# Patient Record
Sex: Male | Born: 1970 | Race: Black or African American | Hispanic: No | Marital: Married | Smoking: Current some day smoker
Health system: Southern US, Community
[De-identification: ages and names within clinical notes are randomized; demographics above are authoritative.]

## PROBLEM LIST (undated history)

## (undated) DIAGNOSIS — R918 Other nonspecific abnormal finding of lung field: Secondary | ICD-10-CM

## (undated) DIAGNOSIS — I639 Cerebral infarction, unspecified: Secondary | ICD-10-CM

## (undated) DIAGNOSIS — R930 Abnormal findings on diagnostic imaging of skull and head, not elsewhere classified: Secondary | ICD-10-CM

## (undated) DIAGNOSIS — Z0189 Encounter for other specified special examinations: Secondary | ICD-10-CM

## (undated) DIAGNOSIS — E786 Lipoprotein deficiency: Secondary | ICD-10-CM

## (undated) DIAGNOSIS — Z8619 Personal history of other infectious and parasitic diseases: Secondary | ICD-10-CM

## (undated) DIAGNOSIS — E785 Hyperlipidemia, unspecified: Secondary | ICD-10-CM

## (undated) DIAGNOSIS — R9089 Other abnormal findings on diagnostic imaging of central nervous system: Secondary | ICD-10-CM

## (undated) DIAGNOSIS — I10 Essential (primary) hypertension: Secondary | ICD-10-CM

## (undated) DIAGNOSIS — R569 Unspecified convulsions: Secondary | ICD-10-CM

## (undated) HISTORY — PX: APPENDECTOMY: SHX54

## (undated) HISTORY — DX: Essential (primary) hypertension: I10

## (undated) HISTORY — DX: Encounter for other specified special examinations: Z01.89

## (undated) HISTORY — DX: Other nonspecific abnormal finding of lung field: R91.8

## (undated) HISTORY — DX: Abnormal findings on diagnostic imaging of skull and head, not elsewhere classified: R93.0

## (undated) HISTORY — DX: Other abnormal findings on diagnostic imaging of central nervous system: R90.89

## (undated) HISTORY — DX: Hyperlipidemia, unspecified: E78.5

## (undated) HISTORY — DX: Lipoprotein deficiency: E78.6

## (undated) HISTORY — DX: Personal history of other infectious and parasitic diseases: Z86.19

---

## 2010-09-18 ENCOUNTER — Emergency Department (HOSPITAL_COMMUNITY): Payer: No Typology Code available for payment source

## 2010-09-18 ENCOUNTER — Emergency Department (HOSPITAL_COMMUNITY)
Admission: EM | Admit: 2010-09-18 | Discharge: 2010-09-18 | Disposition: A | Discharge status: Home / Self Care | Payer: No Typology Code available for payment source | Attending: Emergency Medicine | Admitting: Emergency Medicine

## 2010-09-18 DIAGNOSIS — T1490XA Injury, unspecified, initial encounter: Secondary | ICD-10-CM | POA: Insufficient documentation

## 2010-09-18 DIAGNOSIS — Y92410 Unspecified street and highway as the place of occurrence of the external cause: Secondary | ICD-10-CM | POA: Insufficient documentation

## 2010-09-18 DIAGNOSIS — M25519 Pain in unspecified shoulder: Secondary | ICD-10-CM | POA: Insufficient documentation

## 2010-09-18 DIAGNOSIS — R079 Chest pain, unspecified: Secondary | ICD-10-CM | POA: Insufficient documentation

## 2010-09-18 DIAGNOSIS — R0789 Other chest pain: Secondary | ICD-10-CM | POA: Insufficient documentation

## 2010-09-18 DIAGNOSIS — R51 Headache: Secondary | ICD-10-CM | POA: Insufficient documentation

## 2010-09-18 DIAGNOSIS — M542 Cervicalgia: Secondary | ICD-10-CM | POA: Insufficient documentation

## 2010-09-18 LAB — DIFFERENTIAL
Basophils Absolute: 0 10*3/uL (ref 0.0–0.1)
Basophils Relative: 1 % (ref 0–1)
Eosinophils Absolute: 0.1 10*3/uL (ref 0.0–0.7)
Eosinophils Relative: 1 % (ref 0–5)
Monocytes Absolute: 0.4 10*3/uL (ref 0.1–1.0)
Monocytes Relative: 7 % (ref 3–12)
Neutro Abs: 4.2 10*3/uL (ref 1.7–7.7)

## 2010-09-18 LAB — URINALYSIS, ROUTINE W REFLEX MICROSCOPIC
Bilirubin Urine: NEGATIVE
Glucose, UA: NEGATIVE mg/dL
Hgb urine dipstick: NEGATIVE
Protein, ur: NEGATIVE mg/dL
Specific Gravity, Urine: 1.017 (ref 1.005–1.030)
Urobilinogen, UA: 1 mg/dL (ref 0.0–1.0)

## 2010-09-18 LAB — BASIC METABOLIC PANEL
Calcium: 9.6 mg/dL (ref 8.4–10.5)
GFR calc Af Amer: >60 mL/min (ref >60)
GFR calc non Af Amer: >60 mL/min (ref >60)
Potassium: 3.6 mEq/L (ref 3.5–5.1)
Sodium: 139 mEq/L (ref 135–145)

## 2010-09-18 LAB — CBC
MCH: 28.3 pg (ref 26.0–34.0)
MCHC: 33.2 g/dL (ref 30.0–36.0)
RDW: 13.8 % (ref 11.5–15.5)

## 2010-09-18 LAB — CK TOTAL AND CKMB (NOT AT ARMC)
CK, MB: 2 ng/mL (ref 0.3–4.0)
Relative Index: 0.6 (ref 0.0–2.5)

## 2010-09-18 MED ORDER — IOHEXOL 300 MG/ML  SOLN
100.0000 mL | Freq: Once | INTRAMUSCULAR | Status: AC | PRN
  Administered 2010-09-18: 100 mL via INTRAVENOUS

## 2010-09-26 ENCOUNTER — Emergency Department (HOSPITAL_COMMUNITY)
Admission: EM | Admit: 2010-09-26 | Discharge: 2010-09-27 | Disposition: A | Discharge status: Home / Self Care | Payer: BC Managed Care – PPO | Attending: Emergency Medicine | Admitting: Emergency Medicine

## 2010-09-26 ENCOUNTER — Emergency Department (HOSPITAL_COMMUNITY): Payer: BC Managed Care – PPO

## 2010-09-26 DIAGNOSIS — W269XXA Contact with unspecified sharp object(s), initial encounter: Secondary | ICD-10-CM | POA: Insufficient documentation

## 2010-09-26 DIAGNOSIS — Y92009 Unspecified place in unspecified non-institutional (private) residence as the place of occurrence of the external cause: Secondary | ICD-10-CM | POA: Insufficient documentation

## 2010-09-26 DIAGNOSIS — S51009A Unspecified open wound of unspecified elbow, initial encounter: Secondary | ICD-10-CM | POA: Insufficient documentation

## 2010-10-26 ENCOUNTER — Encounter: Payer: Self-pay | Admitting: Emergency Medicine

## 2010-10-26 DIAGNOSIS — I129 Hypertensive chronic kidney disease with stage 1 through stage 4 chronic kidney disease, or unspecified chronic kidney disease: Secondary | ICD-10-CM | POA: Insufficient documentation

## 2010-10-26 DIAGNOSIS — I10 Essential (primary) hypertension: Secondary | ICD-10-CM | POA: Insufficient documentation

## 2010-10-26 DIAGNOSIS — R918 Other nonspecific abnormal finding of lung field: Secondary | ICD-10-CM

## 2010-10-27 ENCOUNTER — Encounter: Payer: Self-pay | Admitting: Emergency Medicine

## 2010-10-27 ENCOUNTER — Ambulatory Visit (INDEPENDENT_AMBULATORY_CARE_PROVIDER_SITE_OTHER): Payer: BC Managed Care – PPO | Admitting: Emergency Medicine

## 2010-10-27 VITALS — BP 160/100 | HR 76 | Temp 98.7°F | Ht 74.0 in | Wt 205.4 lb

## 2010-10-27 DIAGNOSIS — R918 Other nonspecific abnormal finding of lung field: Secondary | ICD-10-CM

## 2010-10-27 DIAGNOSIS — R222 Localized swelling, mass and lump, trunk: Secondary | ICD-10-CM

## 2010-10-27 NOTE — Assessment & Plan Note (Signed)
Bilateral mainstem endobronchial lesions, ? Etiology . I don't see any other nodules.  - schedule FOB for airway exam - rov the week after to discuss results.
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
## 2010-10-27 NOTE — Patient Instructions (Signed)
We will perform bronchoscopy to evaluate your airways.  Please follow up with Dr Delton Coombes in 2 weeks to discuss the results

## 2010-10-28 ENCOUNTER — Encounter (HOSPITAL_COMMUNITY): Payer: BC Managed Care – PPO

## 2010-11-03 ENCOUNTER — Other Ambulatory Visit: Payer: Self-pay | Admitting: Emergency Medicine

## 2010-11-03 ENCOUNTER — Ambulatory Visit (HOSPITAL_COMMUNITY)
Admission: RE | Admit: 2010-11-03 | Discharge: 2010-11-03 | Disposition: A | Discharge status: Home / Self Care | Payer: BC Managed Care – PPO | Source: Ambulatory Visit | Attending: Emergency Medicine | Admitting: Emergency Medicine

## 2010-11-03 DIAGNOSIS — R222 Localized swelling, mass and lump, trunk: Secondary | ICD-10-CM

## 2010-11-03 DIAGNOSIS — R918 Other nonspecific abnormal finding of lung field: Secondary | ICD-10-CM

## 2010-11-03 DIAGNOSIS — J984 Other disorders of lung: Secondary | ICD-10-CM | POA: Insufficient documentation

## 2010-11-03 DIAGNOSIS — I10 Essential (primary) hypertension: Secondary | ICD-10-CM | POA: Insufficient documentation

## 2010-11-05 NOTE — Op Note (Signed)
[name], [name] NO.:  [id number]  MEDICAL RECORD NO.:  [id number]  LOCATION:  RESP                         FACILITY:  [facility]  PHYSICIAN:  Leslye Peer, MD    DATE OF BIRTH:  1970/09/08  DATE OF PROCEDURE:  11/03/2010 DATE OF DISCHARGE:                              OPERATIVE REPORT   PROCEDURE:  Video fiberoptic bronchoscopy with biopsies.  OPERATOR:  Leslye Peer, MD.  INDICATION:  Abnormal CT scan of the chest with possible endobronchial polyps.  MEDICATIONS GIVEN: 1. Fentanyl 100 mcg IV in divided doses. 2. Versed 6 mg IV in divided doses. 3. Hydralazine 20 mg IV in divided doses. 4. Lidocaine 1%, 15 cc total to the tracheobronchial tree.  CONSENT:  Informed consent was obtained from the patient and a signed copy is on his hospital chart.  PROCEDURE DETAILS:  [name] is a very pleasant 40 year old gentleman with little past medical history beyond hypertension.  He had a CT scan of his chest performed that showed bilateral mainstem lobular lesions, larger on the left than on the right.  He was referred for evaluation of the abnormal CT scan of the chest.  We elected to do fiberoptic bronchoscopy to investigate the presence of these lesions.  On the date of the procedure, he was taken to the endoscopy suite and informed consent was obtained.  Conscious sedation was initiated after a formal time-out as indicated above.  He was hypertensive at the beginning of the case and remained so after sedation was started.  For this reason, he received hydralazine 10 mg IV with improvement in his hypertension to the 150s over 80s to 90s.  The bronchoscope was introduced without difficulty through the right nare and the glottis was visualized and was normal in appearance.  The glottis was treated with 1% lidocaine and then the trachea was intubated.  The proximal and distal trachea were within normal limits.  The main carina was sharp with some evidence  of pitting right at the apex of the main carina.  This area was brushed and biopsied with endobronchial biopsies.  Inspection of the right mainstem and left mainstem bronchi revealed that the suspected endobronchial polyps were absent.  There was no evidence of any significant mainstem endobronchial lesions.  I suspect that the CT findings reflected mucus which has since been cleared.  Inspection of the right-sided airways revealed a normal right upper lobe, right middle lobe, and right lower lobe carina and airways without any endobronchial lesions or abnormal secretions.  The left side exam showed a normal left lower lobe.  The left upper lobe bronchus had a small area of hypopigmentation and approximately 5-6 o'clock.  This was biopsied and brushed to be sent for cytology and pathology.  The lingula appeared normal.  Bronchial washings were pooled and will be sent for microbiology and cytology. The patient tolerated the procedure well.  There was no significant blood loss, and there were no obvious complications.  He was taken to endoscopy and recovered from his conscious sedation.  SAMPLES: 1. Endobronchial biopsies from the main carina. 2. Endobronchial biopsies from the left upper lobe. 3. Endobronchial brushings from the main  carina. 4. Endobronchial brushings from the left upper lobe.  PLANS:  We will follow up the cytology and pathology results with [name] when they become available.  He needs to obtain a primary care physician in order to be started on antihypertensive therapy.  He received a total of 20 mg hydralazine while in the endoscopy suite to maintain adequate blood pressure control.  We will help refer him to a primary care physician when I see him and follow up to review his bronchoscopy results.     Leslye Peer, MD     RSB/MEDQ  D:  11/03/2010  T:  11/03/2010  Job:  [id number]  Electronically Signed by Levy Pupa MD on 11/05/2010 12:33:41 PM

## 2010-11-06 LAB — CULTURE, BAL-QUANTITATIVE W GRAM STAIN
Colony Count: 40000
Gram Stain: NONE SEEN

## 2010-11-15 ENCOUNTER — Ambulatory Visit (INDEPENDENT_AMBULATORY_CARE_PROVIDER_SITE_OTHER): Payer: BC Managed Care – PPO | Admitting: Emergency Medicine

## 2010-11-15 ENCOUNTER — Encounter: Payer: Self-pay | Admitting: Emergency Medicine

## 2010-11-15 VITALS — BP 170/110 | HR 82 | Temp 98.1°F | Ht 74.0 in | Wt 205.0 lb

## 2010-11-15 DIAGNOSIS — R918 Other nonspecific abnormal finding of lung field: Secondary | ICD-10-CM

## 2010-11-15 DIAGNOSIS — R222 Localized swelling, mass and lump, trunk: Secondary | ICD-10-CM

## 2010-11-15 NOTE — Assessment & Plan Note (Signed)
Turned out to be retained mucous in airways. Bx's all negative. Reassured him and advised him to stop smoking.
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
## 2010-11-15 NOTE — Patient Instructions (Signed)
Patient needs to find a primary care physician (possibly with [facility]) Follow-up with Dr. Delton Coombes as needed.

## 2010-12-01 LAB — FUNGUS CULTURE W SMEAR: Fungal Smear: NONE SEEN

## 2010-12-18 LAB — AFB CULTURE WITH SMEAR (NOT AT ARMC)

## 2011-05-21 ENCOUNTER — Emergency Department (HOSPITAL_COMMUNITY)
Admission: EM | Admit: 2011-05-21 | Discharge: 2011-05-21 | Disposition: A | Discharge status: Home / Self Care | Payer: BC Managed Care – PPO | Attending: Emergency Medicine | Admitting: Emergency Medicine

## 2011-05-21 ENCOUNTER — Encounter (HOSPITAL_COMMUNITY): Payer: Self-pay

## 2011-05-21 DIAGNOSIS — S51809A Unspecified open wound of unspecified forearm, initial encounter: Secondary | ICD-10-CM | POA: Insufficient documentation

## 2011-05-21 DIAGNOSIS — W261XXA Contact with sword or dagger, initial encounter: Secondary | ICD-10-CM | POA: Insufficient documentation

## 2011-05-21 DIAGNOSIS — F17200 Nicotine dependence, unspecified, uncomplicated: Secondary | ICD-10-CM | POA: Insufficient documentation

## 2011-05-21 DIAGNOSIS — S51812A Laceration without foreign body of left forearm, initial encounter: Secondary | ICD-10-CM

## 2011-05-21 DIAGNOSIS — W260XXA Contact with knife, initial encounter: Secondary | ICD-10-CM | POA: Insufficient documentation

## 2011-05-21 DIAGNOSIS — I10 Essential (primary) hypertension: Secondary | ICD-10-CM | POA: Insufficient documentation

## 2011-05-21 MED ORDER — LIDOCAINE-EPINEPHRINE 2 %-1:100000 IJ SOLN: 20.0000 mL | Freq: Once | INTRAMUSCULAR | Status: DC

## 2011-05-21 MED ORDER — HYDROCODONE-ACETAMINOPHEN 5-500 MG PO TABS: 1.0000 | ORAL_TABLET | Freq: Four times a day (QID) | ORAL | Status: AC | PRN

## 2011-05-21 MED ORDER — OXYCODONE-ACETAMINOPHEN 5-325 MG PO TABS
1.0000 | ORAL_TABLET | Freq: Once | ORAL | Status: AC
  Administered 2011-05-21: 1 via ORAL
  Filled 2011-05-21: qty 1

## 2011-05-21 MED ORDER — LIDOCAINE-EPINEPHRINE 1 %-1:100000 IJ SOLN: 20.0000 mL | Freq: Once | INTRAMUSCULAR | Status: DC
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
## 2011-05-21 NOTE — Discharge Instructions (Signed)
Return to 10 days to have your suture removal.  Return sooner if you notice signs of infection.  Take pain medication as needed.  Laceration Care, Adult A laceration is a cut or lesion that goes through all layers of the skin and into the tissue just beneath the skin. TREATMENT  Some lacerations may not require closure. Some lacerations may not be able to be closed due to an increased risk of infection. It is important to see your caregiver as soon as possible after an injury to minimize the risk of infection and maximize the opportunity for successful closure. If closure is appropriate, pain medicines may be given, if needed. The wound will be cleaned to help prevent infection. Your caregiver will use stitches (sutures), staples, wound glue (adhesive), or skin adhesive strips to repair the laceration. These tools bring the skin edges together to allow for faster healing and a better cosmetic outcome. However, all wounds will heal with a scar. Once the wound has healed, scarring can be minimized by covering the wound with sunscreen during the day for 1 full year. HOME CARE INSTRUCTIONS  For sutures or staples:  Keep the wound clean and dry.   If you were given a bandage (dressing), you should change it at least once a day. Also, change the dressing if it becomes wet or dirty, or as directed by your caregiver.   Wash the wound with soap and water 2 times a day. Rinse the wound off with water to remove all soap. Pat the wound dry with a clean towel.   After cleaning, apply a thin layer of the antibiotic ointment as recommended by your caregiver. This will help prevent infection and keep the dressing from sticking.   You may shower as usual after the first 24 hours. Do not soak the wound in water until the sutures are removed.   Only take over-the-counter or prescription medicines for pain, discomfort, or fever as directed by your caregiver.   Get your sutures or staples removed as directed by  your caregiver.  For skin adhesive strips:  Keep the wound clean and dry.   Do not get the skin adhesive strips wet. You may bathe carefully, using caution to keep the wound dry.   If the wound gets wet, pat it dry with a clean towel.   Skin adhesive strips will fall off on their own. You may trim the strips as the wound heals. Do not remove skin adhesive strips that are still stuck to the wound. They will fall off in time.  For wound adhesive:  You may briefly wet your wound in the shower or bath. Do not soak or scrub the wound. Do not swim. Avoid periods of heavy perspiration until the skin adhesive has fallen off on its own. After showering or bathing, gently pat the wound dry with a clean towel.   Do not apply liquid medicine, cream medicine, or ointment medicine to your wound while the skin adhesive is in place. This may loosen the film before your wound is healed.   If a dressing is placed over the wound, be careful not to apply tape directly over the skin adhesive. This may cause the adhesive to be pulled off before the wound is healed.   Avoid prolonged exposure to sunlight or tanning lamps while the skin adhesive is in place. Exposure to ultraviolet light in the first year will darken the scar.   The skin adhesive will usually remain in place for 5 to  10 days, then naturally fall off the skin. Do not pick at the adhesive film.  You may need a tetanus shot if:  You cannot remember when you had your last tetanus shot.   You have never had a tetanus shot.  If you get a tetanus shot, your arm may swell, get red, and feel warm to the touch. This is common and not a problem. If you need a tetanus shot and you choose not to have one, there is a rare chance of getting tetanus. Sickness from tetanus can be serious. SEEK MEDICAL CARE IF:   You have redness, swelling, or increasing pain in the wound.   You see a red line that goes away from the wound.   You have yellowish-white fluid  (pus) coming from the wound.   You have a fever.   You notice a bad smell coming from the wound or dressing.   Your wound breaks open before or after sutures have been removed.   You notice something coming out of the wound such as wood or glass.   Your wound is on your hand or foot and you cannot move a finger or toe.  SEEK IMMEDIATE MEDICAL CARE IF:   Your pain is not controlled with prescribed medicine.   You have severe swelling around the wound causing pain and numbness or a change in color in your arm, hand, leg, or foot.   Your wound splits open and starts bleeding.   You have worsening numbness, weakness, or loss of function of any joint around or beyond the wound.   You develop painful lumps near the wound or on the skin anywhere on your body.  MAKE SURE YOU:   Understand these instructions.   Will watch your condition.   Will get help right away if you are not doing well or get worse.  Document Released: 01/30/2005 Document Revised: 01/19/2011 Document Reviewed: 07/26/2010 [facility] Patient Information 2012 [place], [place].

## 2011-05-21 NOTE — ED Notes (Signed)
Patient began to feel dizzy in triage with mild diaphoresis.

## 2011-05-21 NOTE — ED Provider Notes (Signed)
Medical screening examination/treatment/procedure(s) were performed by non-physician practitioner and as supervising physician I was immediately available for consultation/collaboration.   Carleene Cooper III, MD 05/21/11 [id number]

## 2011-07-03 ENCOUNTER — Emergency Department (HOSPITAL_COMMUNITY)
Admission: EM | Admit: 2011-07-03 | Discharge: 2011-07-03 | Disposition: A | Discharge status: Home / Self Care | Payer: BC Managed Care – PPO | Source: Home / Self Care | Attending: Family Medicine | Admitting: Family Medicine

## 2011-07-03 ENCOUNTER — Encounter (HOSPITAL_COMMUNITY): Payer: Self-pay

## 2011-07-03 DIAGNOSIS — Z202 Contact with and (suspected) exposure to infections with a predominantly sexual mode of transmission: Secondary | ICD-10-CM

## 2011-07-03 LAB — URINE MICROSCOPIC-ADD ON

## 2011-07-03 LAB — URINALYSIS, ROUTINE W REFLEX MICROSCOPIC
Glucose, UA: NEGATIVE mg/dL
Nitrite: NEGATIVE
pH: 6.5 (ref 5.0–8.0)

## 2011-07-03 MED ORDER — METRONIDAZOLE 500 MG PO TABS
2000.0000 mg | ORAL_TABLET | ORAL | Status: AC
  Administered 2011-07-03: 2000 mg via ORAL

## 2011-07-03 MED ORDER — METRONIDAZOLE 500 MG PO TABS
ORAL_TABLET | ORAL | Status: AC
  Filled 2011-07-03: qty 2

## 2011-07-03 NOTE — Discharge Instructions (Signed)
Read the information below.  If you develop fevers, abdominal pain, nausea and vomiting, penile discharge, or pain with urination, return to the urgent care.  You may return to the urgent care at any time for worsening condition or any new symptoms that concern you.
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
## 2011-07-03 NOTE — ED Notes (Signed)
Partner here w him; wants to be checked for STD; pt c/o has been having pain in lower abdominal area

## 2011-07-04 LAB — GC/CHLAMYDIA PROBE AMP, GENITAL: GC Probe Amp, Genital: NEGATIVE

## 2011-07-04 NOTE — ED Provider Notes (Signed)
Medical screening examination/treatment/procedure(s) were performed by non-physician practitioner and as supervising physician I was immediately available for consultation/collaboration.   [facility]; MD   Sharin Grave, MD 07/04/11 1228

## 2011-08-07 ENCOUNTER — Telehealth: Payer: Self-pay | Admitting: Emergency Medicine

## 2011-08-07 NOTE — Telephone Encounter (Signed)
Forward to Dr. Delton Coombes for review on 08-07-11 ym

## 2012-09-13 DIAGNOSIS — I639 Cerebral infarction, unspecified: Secondary | ICD-10-CM

## 2012-09-13 DIAGNOSIS — E785 Hyperlipidemia, unspecified: Secondary | ICD-10-CM

## 2012-09-13 HISTORY — DX: Hyperlipidemia, unspecified: E78.5

## 2012-09-13 HISTORY — DX: Cerebral infarction, unspecified: I63.9

## 2012-09-15 ENCOUNTER — Emergency Department (HOSPITAL_COMMUNITY): Payer: 59

## 2012-09-15 ENCOUNTER — Encounter (HOSPITAL_COMMUNITY): Payer: Self-pay | Admitting: Cardiology

## 2012-09-15 ENCOUNTER — Inpatient Hospital Stay (HOSPITAL_COMMUNITY)
Admission: EM | Admit: 2012-09-15 | Discharge: 2012-09-18 | DRG: 065 | Disposition: A | Discharge status: Home / Self Care | Payer: 59 | Attending: Family Medicine | Admitting: Family Medicine

## 2012-09-15 ENCOUNTER — Inpatient Hospital Stay (HOSPITAL_COMMUNITY): Payer: 59

## 2012-09-15 DIAGNOSIS — G8190 Hemiplegia, unspecified affecting unspecified side: Secondary | ICD-10-CM | POA: Diagnosis present

## 2012-09-15 DIAGNOSIS — I639 Cerebral infarction, unspecified: Secondary | ICD-10-CM

## 2012-09-15 DIAGNOSIS — I129 Hypertensive chronic kidney disease with stage 1 through stage 4 chronic kidney disease, or unspecified chronic kidney disease: Secondary | ICD-10-CM | POA: Diagnosis present

## 2012-09-15 DIAGNOSIS — R531 Weakness: Secondary | ICD-10-CM | POA: Diagnosis present

## 2012-09-15 DIAGNOSIS — Z8249 Family history of ischemic heart disease and other diseases of the circulatory system: Secondary | ICD-10-CM

## 2012-09-15 DIAGNOSIS — Z720 Tobacco use: Secondary | ICD-10-CM | POA: Diagnosis present

## 2012-09-15 DIAGNOSIS — I63522 Cerebral infarction due to unspecified occlusion or stenosis of left anterior cerebral artery: Secondary | ICD-10-CM | POA: Diagnosis present

## 2012-09-15 DIAGNOSIS — Z8673 Personal history of transient ischemic attack (TIA), and cerebral infarction without residual deficits: Secondary | ICD-10-CM

## 2012-09-15 DIAGNOSIS — I6340 Cerebral infarction due to embolism of unspecified cerebral artery: Principal | ICD-10-CM | POA: Diagnosis present

## 2012-09-15 DIAGNOSIS — R930 Abnormal findings on diagnostic imaging of skull and head, not elsewhere classified: Secondary | ICD-10-CM

## 2012-09-15 DIAGNOSIS — Z7982 Long term (current) use of aspirin: Secondary | ICD-10-CM

## 2012-09-15 DIAGNOSIS — F17200 Nicotine dependence, unspecified, uncomplicated: Secondary | ICD-10-CM | POA: Diagnosis present

## 2012-09-15 DIAGNOSIS — Z833 Family history of diabetes mellitus: Secondary | ICD-10-CM

## 2012-09-15 DIAGNOSIS — R9089 Other abnormal findings on diagnostic imaging of central nervous system: Secondary | ICD-10-CM

## 2012-09-15 DIAGNOSIS — N183 Chronic kidney disease, stage 3 unspecified: Secondary | ICD-10-CM | POA: Diagnosis present

## 2012-09-15 DIAGNOSIS — Z79899 Other long term (current) drug therapy: Secondary | ICD-10-CM

## 2012-09-15 DIAGNOSIS — I6350 Cerebral infarction due to unspecified occlusion or stenosis of unspecified cerebral artery: Secondary | ICD-10-CM

## 2012-09-15 DIAGNOSIS — I10 Essential (primary) hypertension: Secondary | ICD-10-CM | POA: Diagnosis present

## 2012-09-15 DIAGNOSIS — M6281 Muscle weakness (generalized): Secondary | ICD-10-CM

## 2012-09-15 DIAGNOSIS — E785 Hyperlipidemia, unspecified: Secondary | ICD-10-CM | POA: Diagnosis present

## 2012-09-15 HISTORY — DX: Abnormal findings on diagnostic imaging of skull and head, not elsewhere classified: R93.0

## 2012-09-15 HISTORY — DX: Other abnormal findings on diagnostic imaging of central nervous system: R90.89

## 2012-09-15 HISTORY — DX: Cerebral infarction, unspecified: I63.9

## 2012-09-15 LAB — POCT I-STAT, CHEM 8
BUN: 16 mg/dL (ref 6–23)
Chloride: 106 mEq/L (ref 96–112)
Creatinine, Ser: 1.2 mg/dL (ref 0.50–1.35)
Potassium: 4.1 mEq/L (ref 3.5–5.1)
Sodium: 142 mEq/L (ref 135–145)

## 2012-09-15 LAB — PROTIME-INR: INR: 0.96 (ref 0.00–1.49)

## 2012-09-15 LAB — COMPREHENSIVE METABOLIC PANEL
ALT: 13 U/L (ref 0–53)
Alkaline Phosphatase: 83 U/L (ref 39–117)
BUN: 17 mg/dL (ref 6–23)
CO2: 26 mEq/L (ref 19–32)
GFR calc Af Amer: 88 mL/min — ABNORMAL LOW (ref >90)
GFR calc non Af Amer: 76 mL/min — ABNORMAL LOW (ref >90)
Glucose, Bld: 102 mg/dL — ABNORMAL HIGH (ref 70–99)
Potassium: 4.1 mEq/L (ref 3.5–5.1)
Sodium: 140 mEq/L (ref 135–145)
Total Bilirubin: 0.7 mg/dL (ref 0.3–1.2)
Total Protein: 7 g/dL (ref 6.0–8.3)

## 2012-09-15 LAB — URINALYSIS, ROUTINE W REFLEX MICROSCOPIC
Glucose, UA: NEGATIVE mg/dL
Leukocytes, UA: NEGATIVE
Protein, ur: NEGATIVE mg/dL
Specific Gravity, Urine: 1.01 (ref 1.005–1.030)
pH: 6.5 (ref 5.0–8.0)

## 2012-09-15 LAB — CREATININE, SERUM
Creatinine, Ser: 1.1 mg/dL (ref 0.50–1.35)
GFR calc non Af Amer: 81 mL/min — ABNORMAL LOW (ref >90)

## 2012-09-15 LAB — CBC
Hemoglobin: 14.4 g/dL (ref 13.0–17.0)
Hemoglobin: 14.2 g/dL (ref 13.0–17.0)
MCH: 29.6 pg (ref 26.0–34.0)
MCHC: 34.1 g/dL (ref 30.0–36.0)
MCV: 85.6 fL (ref 78.0–100.0)
Platelets: 158 10*3/uL (ref 150–400)
RBC: 4.86 MIL/uL (ref 4.22–5.81)
RBC: 4.89 MIL/uL (ref 4.22–5.81)
WBC: 5.6 10*3/uL (ref 4.0–10.5)

## 2012-09-15 LAB — URINE MICROSCOPIC-ADD ON

## 2012-09-15 LAB — DIFFERENTIAL
Eosinophils Absolute: 0.2 10*3/uL (ref 0.0–0.7)
Lymphocytes Relative: 23 % (ref 12–46)
Lymphs Abs: 1.3 10*3/uL (ref 0.7–4.0)
Monocytes Relative: 6 % (ref 3–12)
Neutrophils Relative %: 68 % (ref 43–77)

## 2012-09-15 LAB — POCT I-STAT TROPONIN I

## 2012-09-15 LAB — RAPID URINE DRUG SCREEN, HOSP PERFORMED: Opiates: POSITIVE — AB

## 2012-09-15 LAB — GLUCOSE, CAPILLARY: Glucose-Capillary: 95 mg/dL (ref 70–99)

## 2012-09-15 MED ORDER — ONDANSETRON HCL 4 MG/2ML IJ SOLN
4.0000 mg | Freq: Once | INTRAMUSCULAR | Status: AC
  Administered 2012-09-15: 4 mg via INTRAVENOUS
  Filled 2012-09-15: qty 2

## 2012-09-15 MED ORDER — METOPROLOL TARTRATE 1 MG/ML IV SOLN
2.5000 mg | Freq: Four times a day (QID) | INTRAVENOUS | Status: DC | PRN
  Administered 2012-09-16 (×2): 2.5 mg via INTRAVENOUS
  Filled 2012-09-15 (×3): qty 5

## 2012-09-15 MED ORDER — LABETALOL HCL 5 MG/ML IV SOLN
20.0000 mg | Freq: Once | INTRAVENOUS | Status: AC
  Administered 2012-09-15: 20 mg via INTRAVENOUS
  Filled 2012-09-15: qty 4

## 2012-09-15 MED ORDER — ASPIRIN EC 325 MG PO TBEC
325.0000 mg | DELAYED_RELEASE_TABLET | Freq: Every day | ORAL | Status: DC
  Administered 2012-09-15 – 2012-09-18 (×4): 325 mg via ORAL
  Filled 2012-09-15 (×5): qty 1

## 2012-09-15 MED ORDER — NICOTINE 21 MG/24HR TD PT24
21.0000 mg | MEDICATED_PATCH | Freq: Every day | TRANSDERMAL | Status: DC
  Administered 2012-09-15 – 2012-09-18 (×4): 21 mg via TRANSDERMAL
  Filled 2012-09-15 (×4): qty 1

## 2012-09-15 MED ORDER — ONDANSETRON HCL 4 MG/2ML IJ SOLN: 4.0000 mg | Freq: Three times a day (TID) | INTRAMUSCULAR | Status: AC | PRN

## 2012-09-15 MED ORDER — TRAMADOL HCL 50 MG PO TABS
50.0000 mg | ORAL_TABLET | Freq: Four times a day (QID) | ORAL | Status: DC | PRN
  Administered 2012-09-16: 50 mg via ORAL
  Filled 2012-09-15: qty 1

## 2012-09-15 MED ORDER — MORPHINE SULFATE 4 MG/ML IJ SOLN
4.0000 mg | Freq: Once | INTRAMUSCULAR | Status: AC
  Administered 2012-09-15: 4 mg via INTRAVENOUS
  Filled 2012-09-15: qty 1

## 2012-09-15 MED ORDER — ACETAMINOPHEN 325 MG PO TABS
650.0000 mg | ORAL_TABLET | Freq: Four times a day (QID) | ORAL | Status: DC | PRN
  Administered 2012-09-15 – 2012-09-17 (×4): 650 mg via ORAL
  Filled 2012-09-15 (×4): qty 2

## 2012-09-15 MED ORDER — SENNOSIDES-DOCUSATE SODIUM 8.6-50 MG PO TABS: 1.0000 | ORAL_TABLET | Freq: Every evening | ORAL | Status: DC | PRN

## 2012-09-15 MED ORDER — LABETALOL HCL 5 MG/ML IV SOLN: 20.0000 mg | Freq: Once | INTRAVENOUS | Status: DC

## 2012-09-15 MED ORDER — GADOBENATE DIMEGLUMINE 529 MG/ML IV SOLN
17.0000 mL | Freq: Once | INTRAVENOUS | Status: AC | PRN
  Administered 2012-09-15: 17 mL via INTRAVENOUS

## 2012-09-15 MED ORDER — ENOXAPARIN SODIUM 40 MG/0.4ML ~~LOC~~ SOLN
40.0000 mg | SUBCUTANEOUS | Status: DC
  Administered 2012-09-15: 40 mg via SUBCUTANEOUS
  Filled 2012-09-15 (×3): qty 0.4
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
## 2012-09-15 NOTE — ED Notes (Signed)
Pt reports that he started having some right sided weakness last night that continued into this morning. States that he also developed a headache over the past couple of days. Pt weaker grip strength on the right side and weakness noted in the leg. Pt A&Ox4.
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
## 2012-09-16 DIAGNOSIS — M79A29 Nontraumatic compartment syndrome of unspecified lower extremity: Secondary | ICD-10-CM

## 2012-09-16 DIAGNOSIS — Z8673 Personal history of transient ischemic attack (TIA), and cerebral infarction without residual deficits: Secondary | ICD-10-CM

## 2012-09-16 LAB — BASIC METABOLIC PANEL
BUN: 16 mg/dL (ref 6–23)
CO2: 27 mEq/L (ref 19–32)
Calcium: 9.7 mg/dL (ref 8.4–10.5)
Creatinine, Ser: 1.08 mg/dL (ref 0.50–1.35)
Glucose, Bld: 153 mg/dL — ABNORMAL HIGH (ref 70–99)

## 2012-09-16 LAB — LIPID PANEL
Cholesterol: 159 mg/dL (ref 0–200)
HDL: 30 mg/dL — ABNORMAL LOW (ref >39)
LDL Cholesterol: 104 mg/dL — ABNORMAL HIGH (ref 0–99)
Triglycerides: 123 mg/dL (ref <150)

## 2012-09-16 LAB — CARDIOLIPIN ANTIBODIES, IGG, IGM, IGA
Anticardiolipin IgG: 10 GPL U/mL — ABNORMAL LOW (ref <23)
Anticardiolipin IgM: 2 MPL U/mL — ABNORMAL LOW (ref <11)

## 2012-09-16 LAB — LUPUS ANTICOAGULANT PANEL
DRVVT: 36.8 secs (ref <42.9)
Lupus Anticoagulant: NOT DETECTED

## 2012-09-16 LAB — HEMOGLOBIN A1C: Hgb A1c MFr Bld: 5.2 % (ref <5.7)

## 2012-09-16 MED ORDER — POTASSIUM CHLORIDE CRYS ER 20 MEQ PO TBCR
40.0000 meq | EXTENDED_RELEASE_TABLET | Freq: Once | ORAL | Status: AC
  Administered 2012-09-16: 40 meq via ORAL
  Filled 2012-09-16: qty 2

## 2012-09-16 MED ORDER — PNEUMOCOCCAL VAC POLYVALENT 25 MCG/0.5ML IJ INJ
0.5000 mL | INJECTION | INTRAMUSCULAR | Status: AC
  Administered 2012-09-17: 0.5 mL via INTRAMUSCULAR
  Filled 2012-09-16: qty 0.5

## 2012-09-16 MED ORDER — AMLODIPINE BESYLATE 10 MG PO TABS
10.0000 mg | ORAL_TABLET | Freq: Every day | ORAL | Status: DC
  Administered 2012-09-17 – 2012-09-18 (×2): 10 mg via ORAL
  Filled 2012-09-16 (×2): qty 1

## 2012-09-16 MED ORDER — ZOLPIDEM TARTRATE 5 MG PO TABS
10.0000 mg | ORAL_TABLET | Freq: Every evening | ORAL | Status: DC | PRN
  Administered 2012-09-16: 10 mg via ORAL
  Filled 2012-09-16: qty 2

## 2012-09-16 MED ORDER — HYDROCHLOROTHIAZIDE 12.5 MG PO CAPS
12.5000 mg | ORAL_CAPSULE | Freq: Every day | ORAL | Status: DC
  Filled 2012-09-16: qty 1

## 2012-09-16 MED ORDER — AMLODIPINE BESYLATE 5 MG PO TABS
5.0000 mg | ORAL_TABLET | Freq: Every day | ORAL | Status: DC
  Administered 2012-09-16 – 2012-09-18 (×3): 5 mg via ORAL
  Filled 2012-09-16 (×3): qty 1

## 2012-09-16 MED ORDER — AMLODIPINE BESYLATE 5 MG PO TABS
5.0000 mg | ORAL_TABLET | Freq: Once | ORAL | Status: AC
  Administered 2012-09-16: 5 mg via ORAL
  Filled 2012-09-16: qty 1

## 2012-09-16 MED ORDER — ATORVASTATIN CALCIUM 10 MG PO TABS
10.0000 mg | ORAL_TABLET | Freq: Every day | ORAL | Status: DC
  Administered 2012-09-16 – 2012-09-17 (×2): 10 mg via ORAL
  Filled 2012-09-16 (×3): qty 1

## 2012-09-16 MED ORDER — AMLODIPINE BESYLATE 10 MG PO TABS
10.0000 mg | ORAL_TABLET | Freq: Every day | ORAL | Status: DC
  Filled 2012-09-16: qty 1

## 2012-09-16 NOTE — Progress Notes (Signed)
SLP Cancellation Note  Patient Details Name: Henry Simmons MRN: [id number] DOB: 1970-04-24   Cancelled treatment:       Reason Eval/Treat Not Completed: Patient at procedure or test/unavailable. Attempted cognitive linguistic eval, pt out of room. Will try again next date.   [name], MA CCC-SLP [phone]  [name] 09/16/2012, 12:15 PM

## 2012-09-16 NOTE — Progress Notes (Signed)
[facility] PROGRESS NOTE  Henry Simmons [facility] RUE:[id number] DOB: 1970-09-11 DOA: 09/15/2012 PCP: Default, Provider, MD  Assessment/Plan: #1 acute left ACA stroke Patient had presented with malignant hypertension with systolic blood pressures in the 200s. Some improvement with blood pressure. Patient also presented with right sided weakness with some improvement. 2-D echo with no source of emboli. Carotid Dopplers with no significant ICA stenosis. Lower extremity Dopplers negative for DVT. LDL is 104. Start a statin. Continue aspirin. Neurology following and appreciate input and recommendations.  #2 malignant hypertension Patient without controlled hypertension. Patient stated was not on any antihypertensive medications prior to admission. We'll place on Norvasc 10 mg daily. Follow blood pressure. Monitor.  #3. Right-sided weakness Secondary to problem #1.  #4 tobacco abuse Tobacco cessation. Nicotine patch.  Code Status: Full Family Communication: Updated patient and wife at bedside. Disposition Plan: Home when medically stable.   Consultants:  Neurology: Dr. Thad Ranger 09/15/2012  Procedures:  MRI head 09/15/2012  2-D echo 09/16/2012  Carotid Dopplers 09/16/2012  Lower extremity Dopplers 09/16/2012  Antibiotics:  None  HPI/Subjective: Patient with no complaints.  Objective: Filed Vitals:   09/16/12 0027 09/16/12 0148 09/16/12 0600 09/16/12 0922  BP: 180/110 197/113 165/104 163/121  Pulse:  76 72 79  Temp:  98 F (36.7 C) 98 F (36.7 C) 98.4 F (36.9 C)  TempSrc:  Oral Oral Oral  Resp:  18 18 18   Height:      Weight:      SpO2:  100% 100% 100%   No intake or output data in the 24 hours ending 09/16/12 1315 Filed Weights   09/15/12 0744 09/15/12 1301  Weight: 79.379 kg (175 lb) 78.7 kg (173 lb 8 oz)    Exam:   General:  NAD  Cardiovascular: RRR  Respiratory: CTAB  Abdomen: Soft/NT/ND/+BS  Extremities: No c/c/e   Data Reviewed: Basic  Metabolic Panel:  Recent Labs Lab 09/15/12 0802 09/15/12 0822 09/15/12 1429 09/16/12 0820  NA 140 142  --  139  K 4.1 4.1  --  3.5  CL 104 106  --  103  CO2 26  --   --  27  GLUCOSE 102* 100*  --  153*  BUN 17 16  --  16  CREATININE 1.16 1.20 1.10 1.08  CALCIUM 9.7  --   --  9.7   Liver Function Tests:  Recent Labs Lab 09/15/12 0802  AST 19  ALT 13  ALKPHOS 83  BILITOT 0.7  PROT 7.0  ALBUMIN 4.2   No results found for this basename: LIPASE, AMYLASE,  in the last 168 hours No results found for this basename: AMMONIA,  in the last 168 hours CBC:  Recent Labs Lab 09/15/12 0802 09/15/12 0822 09/15/12 1429  WBC 5.6  --  6.2  NEUTROABS 3.8  --   --   HGB 14.4 16.0 14.2  HCT 41.6 47.0 41.6  MCV 85.6  --  85.1  PLT 158  --  163   Cardiac Enzymes:  Recent Labs Lab 09/15/12 0802  TROPONINI <0.30   BNP (last 3 results) No results found for this basename: PROBNP,  in the last 8760 hours CBG:  Recent Labs Lab 09/15/12 1003  GLUCAP 95    No results found for this or any previous visit (from the past 240 hour(s)).   Studies: Dg Chest 2 View  09/15/2012   *RADIOLOGY REPORT*  Clinical Data: Stroke  CHEST - 2 VIEW  Comparison: CT chest dated 09/18/2010  Findings: Lungs  are clear. No pleural effusion or pneumothorax.  Cardiomediastinal silhouette is within normal limits.  Visualized osseous structures are within normal limits.  IMPRESSION: No evidence of acute cardiopulmonary disease.   Original Report Authenticated By: Charline Bills, M.D.   Ct Head Wo Contrast  09/15/2012   *RADIOLOGY REPORT*  Clinical Data:  Headache, right gray weakness CT HEAD WITHOUT CONTRAST  Technique:  Contiguous axial images were obtained from the base of the skull through the vertex without contrast.  Comparison: Prior head and cervical spine CT 07/19/2010  Findings: No acute intracranial hemorrhage.  Nonspecific focal hypoattenuation in the posterior left frontal white matter extending  from the corona radiata cephalad into the centrum semiovale valley.  Gray-white differentiation is preserved throughout. No hydronephrosis or midline shift.  Incidentally, the visualized vessels appear diffusely hyper dense.  This can be seen with dehydration or increased hematocrit levels.  Focal scarring in the right posterior parietal scalp.  The globes and orbits are intact.  No focal calvarial abnormality.  Normal aeration of the mastoid air cells and paranasal sinuses.  IMPRESSION:  Nonspecific linear hypoattenuation within the left posterior frontal white matter as described above. Differential considerations include subacute ischemic insult, developing sequela of microvascular ischemia, and an acute/subacute demyelinating or infectious/inflammatory process.  A mass lesion is considered significantly less likely given the absence of associated mass effect.  Recommend further evaluation with brain MRI with and without contrast.   Original Report Authenticated By: Malachy Moan, M.D.   Mr [facility] Head Wo Contrast  09/15/2012   **ADDENDUM** CREATED: 09/15/2012 12:49:31  A voice recognition error is present in the impressions.  Impression #1 should read "Acute non hemorrhagic infarct within the left ACA distribution."  **END ADDENDUM** SIGNED BY: Chauncey Fischer, M.D.  09/15/2012   *RADIOLOGY REPORT*  Clinical Data:  Right-sided weakness.  Abnormal CT scan.  MRI HEAD WITHOUT AND WITH CONTRAST MRA HEAD WITHOUT CONTRAST  Technique:  Multiplanar, multiecho pulse sequences of the brain and surrounding structures were obtained without and with intravenous contrast.  Angiographic images of the head were obtained using MRA technique without contrast.  Contrast: 17mL MULTIHANCE GADOBENATE DIMEGLUMINE 529 MG/ML IV SOLN  Comparison:  CT head without contrast 09/15/2012 and 09/18/2010.  MRI HEAD WITHOUT AND WITH CONTRAST  Findings:  The diffusion weighted images demonstrate restricted diffusion within the left ACA  territory in the medial left frontal lobe.  The patient has additional, more remote, white matter lesions bilaterally.  There are additional T2 hyperintensities in the corona radiata bilaterally as well as adjacent to the frontal horns of the lateral ventricles, left greater than right.  There is a remote lacunar infarct of the left caudate head.  Flow is present in the major intracranial arteries.  The globes and orbits are intact.  The right sphenoid sinus is opacified.  The remaining paranasal sinuses in the mastoid air cells are clear.  IMPRESSION:  1.  No acute non hemorrhagic infarct within the left ACA distribution. 2.  Additional bilateral remote white matter lesions likely represent other areas of more remote ischemia.  The lesion in the left corona radiata corresponds with the CT finding. 3.  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  MRA HEAD  Findings: The internal carotid arteries are within normal limits from high cervical segments through the ICA termini bilaterally.  A 1.5 mm bulge is present along the inferior aspect of the left internal carotid artery at the expected  location of the left posterior communicating artery.  The anterior communicating artery is patent.  There is a moderate stenosis of the proximal left A2 segment.  There is a moderate stenosis of the posterior right M2 branch as well.  Mild distal segmental irregularity is present bilaterally.  The left vertebral artery is slightly dominant to the right.  No significant PICA branch vessels are evident.  The AICA branches are dominant.  The basilar artery is normal.  Both posterior cerebral arteries originate from basilar tip.  There is mild attenuation of distal PCA branch vessels.  IMPRESSION:  1.  Moderate proximal left P2 segment stenosis corresponds with the area of infarction. 2.  Moderate stenosis of a proximal right M2 branch. 3.  Mild distal small vessel disease is  advanced for age. 4.  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  These results were called by telephone on 09/15/2012 at 10 o'clock a.m. to Dr. Manus Gunning, who verbally acknowledged these results.  Original Report Authenticated By: Marin Roberts, M.D.   Mr Laqueta Jean Wo Contrast  09/15/2012   **ADDENDUM** CREATED: 09/15/2012 12:49:31  A voice recognition error is present in the impressions.  Impression #1 should read "Acute non hemorrhagic infarct within the left ACA distribution."  **END ADDENDUM** SIGNED BY: Chauncey Fischer, M.D.  09/15/2012   *RADIOLOGY REPORT*  Clinical Data:  Right-sided weakness.  Abnormal CT scan.  MRI HEAD WITHOUT AND WITH CONTRAST MRA HEAD WITHOUT CONTRAST  Technique:  Multiplanar, multiecho pulse sequences of the brain and surrounding structures were obtained without and with intravenous contrast.  Angiographic images of the head were obtained using MRA technique without contrast.  Contrast: 17mL MULTIHANCE GADOBENATE DIMEGLUMINE 529 MG/ML IV SOLN  Comparison:  CT head without contrast 09/15/2012 and 09/18/2010.  MRI HEAD WITHOUT AND WITH CONTRAST  Findings:  The diffusion weighted images demonstrate restricted diffusion within the left ACA territory in the medial left frontal lobe.  The patient has additional, more remote, white matter lesions bilaterally.  There are additional T2 hyperintensities in the corona radiata bilaterally as well as adjacent to the frontal horns of the lateral ventricles, left greater than right.  There is a remote lacunar infarct of the left caudate head.  Flow is present in the major intracranial arteries.  The globes and orbits are intact.  The right sphenoid sinus is opacified.  The remaining paranasal sinuses in the mastoid air cells are clear.  IMPRESSION:  1.  No acute non hemorrhagic infarct within the left ACA distribution. 2.  Additional bilateral remote white matter lesions likely represent other areas of more remote ischemia.  The  lesion in the left corona radiata corresponds with the CT finding. 3.  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  MRA HEAD  Findings: The internal carotid arteries are within normal limits from high cervical segments through the ICA termini bilaterally.  A 1.5 mm bulge is present along the inferior aspect of the left internal carotid artery at the expected location of the left posterior communicating artery.  The anterior communicating artery is patent.  There is a moderate stenosis of the proximal left A2 segment.  There is a moderate stenosis of the posterior right M2 branch as well.  Mild distal segmental irregularity is present bilaterally.  The left vertebral artery is slightly dominant to the right.  No significant PICA branch vessels are evident.  The AICA branches are dominant.  The basilar artery is  normal.  Both posterior cerebral arteries originate from basilar tip.  There is mild attenuation of distal PCA branch vessels.  IMPRESSION:  1.  Moderate proximal left P2 segment stenosis corresponds with the area of infarction. 2.  Moderate stenosis of a proximal right M2 branch. 3.  Mild distal small vessel disease is advanced for age. 4.  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  These results were called by telephone on 09/15/2012 at 10 o'clock a.m. to Dr. Manus Gunning, who verbally acknowledged these results.  Original Report Authenticated By: Marin Roberts, M.D.    Scheduled Meds: . [START ON 09/17/2012] amLODipine  10 mg Oral Daily  . amLODipine  5 mg Oral Daily  . aspirin EC  325 mg Oral Daily  . atorvastatin  10 mg Oral q1800  . enoxaparin (LOVENOX) injection  40 mg Subcutaneous Q24H  . nicotine  21 mg Transdermal Daily  . [START ON 09/17/2012] pneumococcal 23 valent vaccine  0.5 mL Intramuscular Tomorrow-1000   Continuous Infusions:   Principal Problem:   Acute left ACA ischemic stroke Active Problems:   Essential  hypertension, malignant   Right sided weakness   History of CVA (cerebrovascular accident) without residual deficits   Tobacco use disorder    Time spent: >    [facility]  [facility] Pager [phone]. If 7PM-7AM, please contact night-coverage at [email or web address], password [facility] 09/16/2012, 1:15 PM  LOS: 1 day

## 2012-09-16 NOTE — Progress Notes (Addendum)
*  PRELIMINARY RESULTS* Vascular Ultrasound Carotid Duplex (Doppler) has been completed.   Findings consistent with 1-39% internal carotid artery stenosis bilaterally with no evidence of significant plaque morphology. Vertebral arteries are patent with antegrade flow.   *Preliminary Results* Bilateral lower extremity venous duplex completed. Bilateral lower extremities are negative for deep vein thrombosis. There is no evidence of Baker's cyst bilaterally.   09/16/2012 11:54 AM Gertie Fey, RVT, RDCS, RDMS
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
## 2012-09-16 NOTE — Progress Notes (Signed)
   CARE MANAGEMENT NOTE 09/16/2012  Patient:  Henry Simmons, Henry Simmons   Account Number:  [id number]  Date Initiated:  09/16/2012  Documentation initiated by:  Jiles Crocker  Subjective/Objective Assessment:   ADMITTED WITH CVA     Action/Plan:   INDEPENDENT PRIOR TO ADMISSION, LIVES WITH SPOUSE; CM FOLLOWING FOR DCP   Anticipated DC Date:  09/23/2012   Anticipated DC Plan:  HOME/SELF CARE      DC Planning Services  CM consult          Status of service:  In process, will continue to follow Medicare Important Message given?  NA - LOS <3 / Initial given by admissions (If response is "NO", the following Medicare IM given date fields will be blank)  Per UR Regulation:  Reviewed for med. necessity/level of care/duration of stay  Comments:  09/16/2012- B Larysa Pall RN,BSN,MHA

## 2012-09-16 NOTE — Care Management Note (Unsigned)
    Page 1 of 1   09/16/2012     2:20:12 PM   CARE MANAGEMENT NOTE 09/16/2012  Patient:  SHIRL, LUDINGTON   Account Number:  [id number]  Date Initiated:  09/16/2012  Documentation initiated by:  Jiles Crocker  Subjective/Objective Assessment:   ADMITTED WITH CVA     Action/Plan:   INDEPENDENT PRIOR TO ADMISSION, LIVES WITH SPOUSE; CM FOLLOWING FOR DCP   Anticipated DC Date:  09/23/2012   Anticipated DC Plan:  HOME/SELF CARE      DC Planning Services  CM consult      Choice offered to / List presented to:             Status of service:  In process, will continue to follow Medicare Important Message given?  NA - LOS <3 / Initial given by admissions (If response is "NO", the following Medicare IM given date fields will be blank) Date Medicare IM given:   Date Additional Medicare IM given:    Discharge Disposition:    Per UR Regulation:  Reviewed for med. necessity/level of care/duration of stay  If discussed at Long Length of Stay Meetings, dates discussed:    Comments:  09/16/12 1400 Elmer Bales RN, MSN, CM- Met with patient briefly to discuss establishing a PCP.  Pt was given the number for [facility] to get a list of physicians in the area that are accepting his insurance. Pt was also informed that he can call his insurance company or go online for additional information.  CM stressed the importance of establishing a PCP and states that he intends to call while hospitalized to establish care at discharge. Will continue to follow for additional discharge needs.   09/16/2012- B CHANDLER RN,BSN,MHA

## 2012-09-16 NOTE — Evaluation (Signed)
Physical Therapy Evaluation Patient Details Name: Henry Simmons MRN: [id number] DOB: 14-Nov-1970 Today's Date: 09/16/2012 Time: 4098-1191 PT Time Calculation (min): 11 min  PT Assessment / Plan / Recommendation History of Present Illness  42 y.o. male admitted to [facility] with R sided weakness and numbness as well as elevated BP. MRI revealed L ACA embolic CVA.    Clinical Impression  The pt is moving well with mild right leg weakness and high level dynamic balance deficits.  PT will follow acutely for balance and gait training, but pt will likely not need any further PT at discharge.    PT Assessment  Patient needs continued PT services    Follow Up Recommendations  No PT follow up;Supervision - Intermittent    Does the patient have the potential to tolerate intense rehabilitation     Yes  Barriers to Discharge   None None    Equipment Recommendations  None recommended by PT    Recommendations for Other Services   None  Frequency Min 4X/week    Precautions / Restrictions Precautions Precautions: Fall   Pertinent Vitals/Pain See vitals flow sheet.        Mobility  Bed Mobility Bed Mobility: Supine to Sit;Sitting - Scoot to Edge of Bed;Sit to Supine Supine to Sit: With rails;HOB elevated;6: Modified independent (Device/Increase time) Sitting - Scoot to Edge of Bed: 6: Modified independent (Device/Increase time);With rail Sit to Supine: 6: Modified independent (Device/Increase time);With rail;HOB elevated Details for Bed Mobility Assistance: pt used, but did not need bed rail Transfers Transfers: Sit to Stand;Stand to Sit Sit to Stand: 5: Supervision;With upper extremity assist;From bed Stand to Sit: 5: Supervision;With upper extremity assist;To bed Details for Transfer Assistance: supervision for safety due to right leg weaker than left leg and pt reported he got dizzy when he got up earlier today.   Ambulation/Gait Ambulation/Gait Assistance: 4: Min assist Ambulation  Distance (Feet): 400 Feet Assistive device: None Ambulation/Gait Assistance Details: min assist for one LOB when turning suddenly to the right to avoid people in the hallway.  Pt aware that he is listing to the right and is able to self correct.  Gait Pattern: Step-through pattern;Decreased stance time - right Modified Rankin (Stroke Patients Only) Pre-Morbid Rankin Score: No symptoms Modified Rankin: Moderately severe disability        PT Diagnosis: Difficulty walking;Abnormality of gait;Acute pain  PT Problem List: Decreased strength;Decreased activity tolerance;Decreased balance;Decreased mobility;Pain PT Treatment Interventions: Gait training;Stair training;Therapeutic activities;Functional mobility training;Therapeutic exercise;Balance training;Neuromuscular re-education;Patient/family education     PT Goals(Current goals can be found in the care plan section) Acute Rehab PT Goals Patient Stated Goal: to get back to work, decrease HA, increase right side strength back to normal, get BP under control.  PT Goal Formulation: With patient Time For Goal Achievement: 09/23/12 Potential to Achieve Goals: Good  Visit Information  Last PT Received On: 09/16/12 Assistance Needed: +1 History of Present Illness: 42 y.o. male admitted to [facility] with R sided weakness and numbness as well as elevated BP. MRI revealed L ACA embolic CVA.         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 Communication Communication: No difficulties Dominant Hand: Right    Cognition  Cognition Arousal/Alertness: Awake/alert Behavior During Therapy: WFL for tasks assessed/performed Overall Cognitive  Status: Within Functional Limits for tasks assessed    Extremity/Trunk Assessment Upper Extremity Assessment Upper Extremity Assessment: Defer to OT evaluation Lower Extremity Assessment Lower Extremity Assessment: RLE deficits/detail RLE Deficits / Details: 4/5 per MMT EOB Cervical / Trunk Assessment Cervical / Trunk Assessment: Normal      End of Session PT - End of Session Activity Tolerance: Patient tolerated treatment well Patient left: in bed;with call bell/phone within reach;with bed alarm set Nurse Communication: Mobility status   Lurena Joiner B. Dakari Cregger, PT, DPT [phone]   09/16/2012, 4:31 PM

## 2012-09-16 NOTE — Progress Notes (Addendum)
At request of neuro team, pt scheduled for TEE. Schedule full for tomorrow so per discussion with team, have scheduled as outpatient for Wed at 12pm. Pt should arrive at 10:30am and check in at main entrance A at front desk. Case # [id number]. Note the person I spoke with had difficulty booking the case as an outpatient procedure, so she had to book as inpatient and said it will revert to outpt after he is discharged. Dr. Janee Morn made aware of date/time. Info added to Epic DC section. Dayna Dunn PA-C  Addendum: Spoke with Dr. Janee Morn - he will be watching pt's BP before discharging home. He will let us know if pt goes home or remains inpt. If the patient stays inpatient through Wednesday, we may need to switch to inpatient TEE. TEE orders will need to be written tomorrow afternoon based on inpt/outp decision. Inboxed message to Alvester Chou (cardmaster) to be aware. Dayna Dunn PA-C

## 2012-09-16 NOTE — Progress Notes (Signed)
Echo Lab  2D Echocardiogram completed.  Anai Lipson L Kadeen Sroka, RDCS 09/16/2012 11:26 AM

## 2012-09-17 LAB — BASIC METABOLIC PANEL
BUN: 17 mg/dL (ref 6–23)
Calcium: 9.9 mg/dL (ref 8.4–10.5)
Creatinine, Ser: 1.14 mg/dL (ref 0.50–1.35)
GFR calc non Af Amer: 77 mL/min — ABNORMAL LOW (ref >90)
Glucose, Bld: 94 mg/dL (ref 70–99)

## 2012-09-17 LAB — FACTOR 5 LEIDEN

## 2012-09-17 MED ORDER — HYDROCHLOROTHIAZIDE 25 MG PO TABS
25.0000 mg | ORAL_TABLET | Freq: Every day | ORAL | Status: DC
  Administered 2012-09-17 – 2012-09-18 (×2): 25 mg via ORAL
  Filled 2012-09-17 (×2): qty 1

## 2012-09-17 NOTE — Evaluation (Signed)
Occupational Therapy Evaluation Patient Details Name: Henry Simmons MRN: [id number] DOB: 1971-01-03 Today's Date: 09/17/2012 Time: 9528-4132 OT Time Calculation (min): 19 min  OT Assessment / Plan / Recommendation History of present illness 42 y.o. male admitted to [facility] with R sided weakness and numbness as well as elevated BP. MRI revealed L ACA embolic CVA.     Clinical Impression  Patient evaluated by Occupational Therapy with no further acute OT needs identified. All education has been completed and the patient has no further questions. Pt with mild Rt. UE weakness, and was provided with theraband and HEP.  He is modified independent with BADLs.See below for any follow-up Occupational Therapy or equipment needs. OT is signing off. Thank you for this referral.     OT Assessment  Patient does not need any further OT services    Follow Up Recommendations  No OT follow up    Barriers to Discharge      Equipment Recommendations  None recommended by OT    Recommendations for Other Services    Frequency       Precautions / Restrictions Restrictions Weight Bearing Restrictions: No   Pertinent Vitals/Pain    ADL  Eating/Feeding: Independent Where Assessed - Eating/Feeding: Edge of bed Grooming: Wash/dry hands;Wash/dry face;Teeth care;Brushing hair;Modified independent Where Assessed - Grooming: Unsupported standing Upper Body Bathing: Modified independent Where Assessed - Upper Body Bathing: Unsupported standing Lower Body Bathing: Modified independent Where Assessed - Lower Body Bathing: Unsupported standing Upper Body Dressing: Modified independent Where Assessed - Upper Body Dressing: Supported sitting;Unsupported sitting Lower Body Dressing: Modified independent Where Assessed - Lower Body Dressing: Unsupported sit to stand Toilet Transfer: Modified independent Toilet Transfer Method: Sit to stand;Stand pivot Toilet Transfer Equipment: Regular height toilet Toileting -  Clothing Manipulation and Hygiene: Modified independent Where Assessed - Engineer, mining and Hygiene: Standing Tub/Shower Transfer: Modified independent Tub/Shower Transfer Method: Ambulating Transfers/Ambulation Related to ADLs: modified independent ADL Comments: Pt performed simulated shower in standing with no LOB; Pt. able to state s/s of stroke and what to do if he experiences them.   Pt provided with bue t-band and HEP for Rt. UE    OT Diagnosis:    OT Problem List:   OT Treatment Interventions:     OT Goals(Current goals can be found in the care plan section)    Visit Information  Last OT Received On: 09/17/12 Assistance Needed: +1 History of Present Illness: 42 y.o. male admitted to [facility] with R sided weakness and numbness as well as elevated BP. MRI revealed L ACA embolic CVA.         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 Communication Communication: No difficulties Dominant Hand: Right         Vision/Perception Vision - History Baseline Vision: No visual deficits Patient Visual Report: No change from baseline Vision - Assessment Eye Alignment: Within Functional Limits Vision Assessment: Vision tested Ocular Range of Motion: Within Functional Limits Tracking/Visual Pursuits: Able to track stimulus in all quads without difficulty Saccades: Within functional limits Additional Comments: visual attention [facility] Perception Perception: Within Functional Limits Praxis Praxis: Intact   Cognition  Cognition Arousal/Alertness: Awake/alert Behavior During Therapy: WFL for tasks assessed/performed Overall Cognitive Status:  Within Functional Limits for tasks assessed  Extremity/Trunk Assessment Upper Extremity Assessment Upper Extremity Assessment: RUE deficits/detail RUE Deficits / Details: Rt. UE 4+/5 Lower Extremity Assessment Lower Extremity Assessment: Defer to PT evaluation Cervical / Trunk Assessment Cervical / Trunk Assessment: Normal     Mobility Bed Mobility Bed Mobility: Supine to Sit;Sitting - Scoot to Edge of Bed;Sit to Supine Supine to Sit: 7: Independent Sitting - Scoot to Edge of Bed: 7: Independent Sit to Supine: 7: Independent Transfers Transfers: Stand to Sit;Sit to Stand Sit to Stand: 6: Modified independent (Device/Increase time) Stand to Sit: 6: Modified independent (Device/Increase time)     Exercise General Exercises - Upper Extremity Shoulder Flexion: Strengthening;Right;10 reps;Supine;Seated;Theraband Theraband Level (Shoulder Flexion): Level 4 (Blue) Shoulder Extension: Strengthening;Right;10 reps;Supine;Seated;Theraband Theraband Level (Shoulder Extension): Level 4 (Blue) Shoulder Horizontal ABduction: Strengthening;Right;10 reps;Supine;Seated;Theraband Theraband Level (Shoulder Horizontal Abduction): Level 4 (Blue)   Balance     End of Session OT - End of Session Activity Tolerance: Patient tolerated treatment well Patient left: in bed;with call bell/phone within reach;with family/visitor present  GO     [name], Ursula Alert M 09/17/2012, 1:59 PM
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
## 2012-09-17 NOTE — Progress Notes (Signed)
[facility] PROGRESS NOTE  Henry Simmons [facility] ZOX:[id number] DOB: June 02, 1970 DOA: 09/15/2012 PCP: Default, Provider, MD  Assessment/Plan: #1 acute left ACA stroke Patient had presented with malignant hypertension with systolic blood pressures in the 200s. Some improvement with blood pressure. Patient also presented with right sided weakness with some improvement. 2-D echo with no source of emboli. Carotid Dopplers with no significant ICA stenosis. Lower extremity Dopplers negative for DVT. LDL is 104. Continue statin. Manage BP. TEE tomorrow. Continue aspirin. Neurology following and appreciate input and recommendations.  #2 malignant hypertension Patient without controlled hypertension. Patient stated was not on any antihypertensive medications prior to admission. Continue Norvasc 10 mg daily. Will start HCTZ. Follow blood pressure and titrate. Monitor.  #3. Right-sided weakness Secondary to problem #1.  #4 tobacco abuse Tobacco cessation. Nicotine patch.  Code Status: Full Family Communication: Updated patient and wife at bedside. Disposition Plan: Home when medically stable.   Consultants:  Neurology: Dr. Thad Ranger 09/15/2012  Procedures:  MRI head 09/15/2012  2-D echo 09/16/2012  Carotid Dopplers 09/16/2012  Lower extremity Dopplers 09/16/2012  Antibiotics:  None  HPI/Subjective: Patient with no complaints.  Objective: Filed Vitals:   09/16/12 2146 09/16/12 2300 09/17/12 0127 09/17/12 0619  BP: 175/112 162/105 150/101 159/111  Pulse: 84 72 75 89  Temp: 98.5 F (36.9 C)  98.1 F (36.7 C) 97.9 F (36.6 C)  TempSrc: Oral  Oral Oral  Resp: 16  16 16   Height:      Weight:      SpO2: 100%  100% 100%    Intake/Output Summary (Last 24 hours) at 09/17/12 0858 Last data filed at 09/16/12 1800  Gross per 24 hour  Intake    240 ml  Output      0 ml  Net    240 ml   Filed Weights   09/15/12 0744 09/15/12 1301  Weight: 79.379 kg (175 lb) 78.7 kg (173 lb 8 oz)     Exam:   General:  NAD  Cardiovascular: RRR  Respiratory: CTAB  Abdomen: Soft/NT/ND/+BS  Extremities: No c/c/e   Data Reviewed: Basic Metabolic Panel:  Recent Labs Lab 09/15/12 0802 09/15/12 0822 09/15/12 1429 09/16/12 0820 09/17/12 0600  NA 140 142  --  139 140  K 4.1 4.1  --  3.5 4.1  CL 104 106  --  103 104  CO2 26  --   --  27 24  GLUCOSE 102* 100*  --  153* 94  BUN 17 16  --  16 17  CREATININE 1.16 1.20 1.10 1.08 1.14  CALCIUM 9.7  --   --  9.7 9.9   Liver Function Tests:  Recent Labs Lab 09/15/12 0802  AST 19  ALT 13  ALKPHOS 83  BILITOT 0.7  PROT 7.0  ALBUMIN 4.2   No results found for this basename: LIPASE, AMYLASE,  in the last 168 hours No results found for this basename: AMMONIA,  in the last 168 hours CBC:  Recent Labs Lab 09/15/12 0802 09/15/12 0822 09/15/12 1429  WBC 5.6  --  6.2  NEUTROABS 3.8  --   --   HGB 14.4 16.0 14.2  HCT 41.6 47.0 41.6  MCV 85.6  --  85.1  PLT 158  --  163   Cardiac Enzymes:  Recent Labs Lab 09/15/12 0802  TROPONINI <0.30   BNP (last 3 results) No results found for this basename: PROBNP,  in the last 8760 hours CBG:  Recent Labs Lab 09/15/12 1003  GLUCAP  95    No results found for this or any previous visit (from the past 240 hour(s)).   Studies: Dg Chest 2 View  09/15/2012   *RADIOLOGY REPORT*  Clinical Data: Stroke  CHEST - 2 VIEW  Comparison: CT chest dated 09/18/2010  Findings: Lungs are clear. No pleural effusion or pneumothorax.  Cardiomediastinal silhouette is within normal limits.  Visualized osseous structures are within normal limits.  IMPRESSION: No evidence of acute cardiopulmonary disease.   Original Report Authenticated By: Charline Bills, M.D.   Mr [facility] Wo Contrast  09/15/2012   **ADDENDUM** CREATED: 09/15/2012 12:49:31  A voice recognition error is present in the impressions.  Impression #1 should read "Acute non hemorrhagic infarct within the left ACA distribution."   **END ADDENDUM** SIGNED BY: Chauncey Fischer, M.D.  09/15/2012   *RADIOLOGY REPORT*  Clinical Data:  Right-sided weakness.  Abnormal CT scan.  MRI HEAD WITHOUT AND WITH CONTRAST MRA HEAD WITHOUT CONTRAST  Technique:  Multiplanar, multiecho pulse sequences of the brain and surrounding structures were obtained without and with intravenous contrast.  Angiographic images of the head were obtained using MRA technique without contrast.  Contrast: 17mL MULTIHANCE GADOBENATE DIMEGLUMINE 529 MG/ML IV SOLN  Comparison:  CT head without contrast 09/15/2012 and 09/18/2010.  MRI HEAD WITHOUT AND WITH CONTRAST  Findings:  The diffusion weighted images demonstrate restricted diffusion within the left ACA territory in the medial left frontal lobe.  The patient has additional, more remote, white matter lesions bilaterally.  There are additional T2 hyperintensities in the corona radiata bilaterally as well as adjacent to the frontal horns of the lateral ventricles, left greater than right.  There is a remote lacunar infarct of the left caudate head.  Flow is present in the major intracranial arteries.  The globes and orbits are intact.  The right sphenoid sinus is opacified.  The remaining paranasal sinuses in the mastoid air cells are clear.  IMPRESSION:  1.  No acute non hemorrhagic infarct within the left ACA distribution. 2.  Additional bilateral remote white matter lesions likely represent other areas of more remote ischemia.  The lesion in the left corona radiata corresponds with the CT finding. 3.  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  MRA HEAD  Findings: The internal carotid arteries are within normal limits from high cervical segments through the ICA termini bilaterally.  A 1.5 mm bulge is present along the inferior aspect of the left internal carotid artery at the expected location of the left posterior communicating artery.  The anterior communicating  artery is patent.  There is a moderate stenosis of the proximal left A2 segment.  There is a moderate stenosis of the posterior right M2 branch as well.  Mild distal segmental irregularity is present bilaterally.  The left vertebral artery is slightly dominant to the right.  No significant PICA branch vessels are evident.  The AICA branches are dominant.  The basilar artery is normal.  Both posterior cerebral arteries originate from basilar tip.  There is mild attenuation of distal PCA branch vessels.  IMPRESSION:  1.  Moderate proximal left P2 segment stenosis corresponds with the area of infarction. 2.  Moderate stenosis of a proximal right M2 branch. 3.  Mild distal small vessel disease is advanced for age. 4.  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  These results were called by telephone on 09/15/2012 at 10 o'clock a.m. to Dr. Manus Gunning, who verbally acknowledged  these results.  Original Report Authenticated By: Marin Roberts, M.D.   Mr Laqueta Jean Wo Contrast  09/15/2012   **ADDENDUM** CREATED: 09/15/2012 12:49:31  A voice recognition error is present in the impressions.  Impression #1 should read "Acute non hemorrhagic infarct within the left ACA distribution."  **END ADDENDUM** SIGNED BY: Chauncey Fischer, M.D.  09/15/2012   *RADIOLOGY REPORT*  Clinical Data:  Right-sided weakness.  Abnormal CT scan.  MRI HEAD WITHOUT AND WITH CONTRAST MRA HEAD WITHOUT CONTRAST  Technique:  Multiplanar, multiecho pulse sequences of the brain and surrounding structures were obtained without and with intravenous contrast.  Angiographic images of the head were obtained using MRA technique without contrast.  Contrast: 17mL MULTIHANCE GADOBENATE DIMEGLUMINE 529 MG/ML IV SOLN  Comparison:  CT head without contrast 09/15/2012 and 09/18/2010.  MRI HEAD WITHOUT AND WITH CONTRAST  Findings:  The diffusion weighted images demonstrate restricted diffusion within the left ACA territory in the medial left frontal lobe.   The patient has additional, more remote, white matter lesions bilaterally.  There are additional T2 hyperintensities in the corona radiata bilaterally as well as adjacent to the frontal horns of the lateral ventricles, left greater than right.  There is a remote lacunar infarct of the left caudate head.  Flow is present in the major intracranial arteries.  The globes and orbits are intact.  The right sphenoid sinus is opacified.  The remaining paranasal sinuses in the mastoid air cells are clear.  IMPRESSION:  1.  No acute non hemorrhagic infarct within the left ACA distribution. 2.  Additional bilateral remote white matter lesions likely represent other areas of more remote ischemia.  The lesion in the left corona radiata corresponds with the CT finding. 3.  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  MRA HEAD  Findings: The internal carotid arteries are within normal limits from high cervical segments through the ICA termini bilaterally.  A 1.5 mm bulge is present along the inferior aspect of the left internal carotid artery at the expected location of the left posterior communicating artery.  The anterior communicating artery is patent.  There is a moderate stenosis of the proximal left A2 segment.  There is a moderate stenosis of the posterior right M2 branch as well.  Mild distal segmental irregularity is present bilaterally.  The left vertebral artery is slightly dominant to the right.  No significant PICA branch vessels are evident.  The AICA branches are dominant.  The basilar artery is normal.  Both posterior cerebral arteries originate from basilar tip.  There is mild attenuation of distal PCA branch vessels.  IMPRESSION:  1.  Moderate proximal left P2 segment stenosis corresponds with the area of infarction. 2.  Moderate stenosis of a proximal right M2 branch. 3.  Mild distal small vessel disease is advanced for age. 4.  1.5 mm left posterior  communicating artery aneurysm.  These results were called by telephone on 09/15/2012 at 10 o'clock a.m. to Dr. Manus Gunning, who verbally acknowledged these results.  Original Report Authenticated By: Marin Roberts, M.D.    Scheduled Meds: . amLODipine  10 mg Oral Daily  . amLODipine  5 mg Oral Daily  . aspirin EC  325 mg Oral Daily  . atorvastatin  10 mg Oral q1800  . hydrochlorothiazide  25 mg Oral Daily  . nicotine  21 mg Transdermal Daily  . pneumococcal 23 valent vaccine  0.5 mL Intramuscular Tomorrow-1000   Continuous Infusions:  Principal Problem:   Acute left ACA ischemic stroke Active Problems:   Hypertension   Essential hypertension, malignant   Right sided weakness   History of CVA (cerebrovascular accident) without residual deficits   Tobacco use disorder    Time spent: >    [facility]  [facility] Pager [phone]. If 7PM-7AM, please contact night-coverage at [email or web address], password [facility] 09/17/2012, 8:58 AM  LOS: 2 days

## 2012-09-17 NOTE — Progress Notes (Signed)
Physical Therapy Treatment Patient Details Name: Henry Simmons MRN: [id number] DOB: 04/13/1970 Today's Date: 09/17/2012 Time: 1914-7829 PT Time Calculation (min): 26 min  PT Assessment / Plan / Recommendation  History of Present Illness 42 y.o. male admitted to [facility] with R sided weakness and numbness as well as elevated BP. MRI revealed L ACA embolic CVA.     PT Comments   Progressing nicely. Not losing his balance as much however his reaction time is still delayed with some dynamic balance activities. Educated pt on several exercises he could perform for HEP (SLS, step up (eccentric control with RLE). He was educated on the option for OPPT to further improve his balance so that he can return to his very physical job. He declined at this time. I advised him to check with his doctor and employer to make sure that he is physically ready prior to returning to work. 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   Follow Up Recommendations  Supervision - Intermittent;No PT follow up      Does the patient have the potential to tolerate intense rehabilitation     Barriers to Discharge        Equipment Recommendations  None recommended by PT    Recommendations for Other Services    Frequency Min 4X/week   Progress towards PT Goals Progress towards PT goals: Progressing toward goals  Plan Current plan remains appropriate    Precautions / Restrictions Precautions Precautions: Fall Restrictions Weight Bearing Restrictions: No   Pertinent Vitals/Pain Denies pain, HR 130s with ambulation    Mobility  Bed Mobility Bed Mobility: Not assessed Supine to Sit: 7: Independent Sitting - Scoot to Edge of Bed: 7: Independent Sit to Supine: 7: Independent Transfers Transfers: Not assessed Sit to Stand: 6: Modified independent (Device/Increase time) Stand to Sit: 6: Modified independent (Device/Increase time) Details for Transfer Assistance: pt standing in  the room on my arrival Ambulation/Gait Ambulation/Gait Assistance: 5: Supervision;6: Modified independent (Device/Increase time) Ambulation Distance (Feet): 600 Feet Assistive device: None Ambulation/Gait Assistance Details: supervision for some of the higher balance activities however pt able to modify his speed well to adjust and compensate for challenges, see DGI and balance section Gait Pattern: Step-through pattern Gait velocity: slightly decreased from baseline Modified Rankin (Stroke Patients Only) Pre-Morbid Rankin Score: No symptoms Modified Rankin: Slight disability    Exercises General Exercises - Upper Extremity Shoulder Flexion: Strengthening;Right;10 reps;Supine;Seated;Theraband Theraband Level (Shoulder Flexion): Level 4 (Blue) Shoulder Extension: Strengthening;Right;10 reps;Supine;Seated;Theraband Theraband Level (Shoulder Extension): Level 4 (Blue) Shoulder Horizontal ABduction: Strengthening;Right;10 reps;Supine;Seated;Theraband Theraband Level (Shoulder Horizontal Abduction): Level 4 (Blue) Other Exercises Other Exercises: SLS x 2 bilaterally (standing in the corner), cues for trunk engagement; education provided for HEP    PT Goals (current goals can now be found in the care plan section)    Visit Information  Last PT Received On: 09/17/12 Assistance Needed: +1 History of Present Illness: 42 y.o. male admitted to [facility] with R sided weakness and numbness as well as elevated BP. MRI revealed L ACA embolic CVA.      Subjective Data      Cognition  Cognition Arousal/Alertness: Awake/alert Behavior During Therapy: WFL for tasks assessed/performed Overall Cognitive Status: Within Functional Limits for tasks assessed    Balance  Balance Balance Assessed: Yes Static Standing Balance Static Standing - Balance Support: No upper extremity supported Static Standing - Level of Assistance: 5: Stand by assistance Static Standing - Comment/# of Minutes: stepping  reaction noted with LOB SLS activities however appears delayed Single Leg Stance - Right Leg: 3 Single Leg Stance - Left Leg: 5 Tandem Stance - Right Leg: 10 Rhomberg - Eyes Opened: 60 Rhomberg - Eyes Closed: 60 (with SBA due to increased postural sway) Dynamic Standing Balance Dynamic Standing - Balance Support: No upper extremity supported Dynamic Standing - Level of Assistance: 6: Modified independent (Device/Increase time) Dynamic Standing - Balance Activities: Ball toss Dynamic Standing - Comments: throwing the ball with the therapist with various speeds and distances, did not lose his balance, was challenged a few times when he wasn't able to catch the ball, his reaction time is delayed when reaching down to catch the ball Standardized Balance Assessment Standardized Balance Assessment: Dynamic Gait Index Dynamic Gait Index Level Surface: Normal Change in Gait Speed: Normal Gait with Horizontal Head Turns: Mild Impairment Gait with Vertical Head Turns: Mild Impairment Gait and Pivot Turn: Mild Impairment Step Over Obstacle: Normal Step Around Obstacles: Normal Steps: Normal Total Score: 21 High Level Balance High Level Balance Activites: Backward walking;Sudden stops High Level Balance Comments: modified independent for above activities, pt also ambulating and tossing ball to self to gage dual task activities, does drift some when distracted but this improved with practice  End of Session PT - End of Session Equipment Utilized During Treatment: Gait belt Activity Tolerance: Patient tolerated treatment well Patient left: in bed Nurse Communication: Mobility status   GP     Ludger Nutting 09/17/2012, 3:53 PM

## 2012-09-18 ENCOUNTER — Encounter (HOSPITAL_COMMUNITY): Payer: Self-pay | Admitting: Gastroenterology

## 2012-09-18 ENCOUNTER — Encounter: Payer: Self-pay | Admitting: Family Medicine

## 2012-09-18 ENCOUNTER — Encounter (HOSPITAL_COMMUNITY)
Admission: EM | Disposition: A | Discharge status: Home / Self Care | Payer: Self-pay | Source: Home / Self Care | Attending: Internal Medicine

## 2012-09-18 DIAGNOSIS — Z0189 Encounter for other specified special examinations: Secondary | ICD-10-CM

## 2012-09-18 DIAGNOSIS — I6789 Other cerebrovascular disease: Secondary | ICD-10-CM

## 2012-09-18 HISTORY — PX: TEE WITHOUT CARDIOVERSION: SHX5443

## 2012-09-18 HISTORY — DX: Encounter for other specified special examinations: Z01.89

## 2012-09-18 LAB — BASIC METABOLIC PANEL
BUN: 22 mg/dL (ref 6–23)
Calcium: 10.2 mg/dL (ref 8.4–10.5)
Creatinine, Ser: 1.22 mg/dL (ref 0.50–1.35)
GFR calc Af Amer: 82 mL/min — ABNORMAL LOW (ref >90)
GFR calc non Af Amer: 71 mL/min — ABNORMAL LOW (ref >90)

## 2012-09-18 SURGERY — ECHOCARDIOGRAM, TRANSESOPHAGEAL
Anesthesia: Moderate Sedation

## 2012-09-18 MED ORDER — AMLODIPINE BESYLATE 10 MG PO TABS: 10.0000 mg | ORAL_TABLET | Freq: Every day | ORAL | Status: DC

## 2012-09-18 MED ORDER — FENTANYL CITRATE 0.05 MG/ML IJ SOLN
INTRAMUSCULAR | Status: AC
  Filled 2012-09-18: qty 2

## 2012-09-18 MED ORDER — BUTAMBEN-TETRACAINE-BENZOCAINE 2-2-14 % EX AERO
INHALATION_SPRAY | CUTANEOUS | Status: DC | PRN
  Administered 2012-09-18: 2 via TOPICAL

## 2012-09-18 MED ORDER — SODIUM CHLORIDE 0.9 % IV SOLN
INTRAVENOUS | Status: DC
  Administered 2012-09-18: 500 mL via INTRAVENOUS

## 2012-09-18 MED ORDER — ASPIRIN 325 MG PO TBEC: 325.0000 mg | DELAYED_RELEASE_TABLET | Freq: Every day | ORAL | Status: DC

## 2012-09-18 MED ORDER — HYDROCHLOROTHIAZIDE 25 MG PO TABS: 25.0000 mg | ORAL_TABLET | Freq: Every day | ORAL | Status: DC

## 2012-09-18 MED ORDER — DIPHENHYDRAMINE HCL 50 MG/ML IJ SOLN
INTRAMUSCULAR | Status: AC
  Filled 2012-09-18: qty 1

## 2012-09-18 MED ORDER — FENTANYL CITRATE 0.05 MG/ML IJ SOLN
INTRAMUSCULAR | Status: DC | PRN
  Administered 2012-09-18 (×2): 25 ug via INTRAVENOUS

## 2012-09-18 MED ORDER — NICOTINE 21 MG/24HR TD PT24: 1.0000 | MEDICATED_PATCH | Freq: Every day | TRANSDERMAL | Status: DC

## 2012-09-18 MED ORDER — MIDAZOLAM HCL 5 MG/ML IJ SOLN
INTRAMUSCULAR | Status: AC
  Filled 2012-09-18: qty 2

## 2012-09-18 MED ORDER — MIDAZOLAM HCL 10 MG/2ML IJ SOLN
INTRAMUSCULAR | Status: DC | PRN
  Administered 2012-09-18 (×2): 2 mg via INTRAVENOUS

## 2012-09-18 MED ORDER — ATORVASTATIN CALCIUM 10 MG PO TABS: 10.0000 mg | ORAL_TABLET | Freq: Every day | ORAL | Status: DC

## 2012-09-18 NOTE — Progress Notes (Signed)
PT Cancellation Note  Patient Details Name: Henry Simmons MRN: [id number] DOB: 1970/11/01   Cancelled Treatment:    Reason Eval/Treat Not Completed: Patient at procedure or test/unavailable   Linna Hoff PT, DPT Pager: [phone]

## 2012-09-18 NOTE — Plan of Care (Signed)
D/c instructions given to pt. No med scripts given to pt. V/u on d/c instructions and stroke education. Pt is stable to be d/c. Tele d/c. Iv d/c.
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
## 2012-09-18 NOTE — Progress Notes (Signed)
  Echocardiogram Echocardiogram Transesophageal has been performed.  Henry Simmons 09/18/2012, 1:57 PM

## 2012-09-18 NOTE — CV Procedure (Signed)
Procedure: TEE  Indication: CVA  Sedation: Versed 4 mg IV, Fentanyl 50 mcg IV  Findings: Please see echo section for full report.  Normal LV size and systolic function, EF 55-60%.  Moderate concentric LVH.  No significant valvular abnormalities.  RV normal in size and systolic function.  No LAA thrombus.  No PFO/ASD (negative bubble study).  No significant aortic plaque.   No source of embolus.  Moderate concentric LVH suggestive of poorly controlled HTN.   Marca Ancona 09/18/2012 12:48 PM

## 2012-09-18 NOTE — Discharge Summary (Signed)
Physician Discharge Summary  Henry Simmons:[id number] DOB: 12-Jun-1970 DOA: 09/15/2012  PCP: Default, Provider, MD  Admit date: 09/15/2012 Discharge date: 09/18/2012  Time spent: 35 minutes  Recommendations for Outpatient Follow-up:  1. Needs re-eval BP control 2. Continue ASA 325 lifelong for 2ndary CVa protection 3. Continue statin-lipid panel 3 mo  Discharge Diagnoses:  Principal Problem:   Acute left ACA ischemic stroke Active Problems:   Hypertension   Essential hypertension, malignant   Right sided weakness   History of CVA (cerebrovascular accident) without residual deficits   Tobacco use disorder   Discharge Condition: fair  Diet recommendation:  Heart health low salt  Filed Weights   09/15/12 0744 09/15/12 1301  Weight: 79.379 kg (175 lb) 78.7 kg (173 lb 8 oz)    History of present illness:  42 yr old AAM admitted 09/15/12 with weakness HA.  MRI was done showing a CV, and had HTn urgency in ED with Sys BP in the 200 range.  See below  Hospital Course:  #1 acute left ACA stroke  Patient had presented with malignant hypertension with systolic blood pressures in the 200s. Some improvement with blood pressure. Patient also presented with right sided weakness with some improvement. 2-D echo with no source of emboli. Carotid Dopplers with no significant ICA stenosis. Lower extremity Dopplers negative for DVT. LDL is 104. Continue statin. Manage BP. TEE performed 09/18/12 was neg for PFO.  Continue aspirin 325 daily. Given a 10 day work excuse note #2 malignant hypertension  Patient without controlled hypertension. Patient stated was not on any antihypertensive medications prior to admission. Continue Norvasc 10 mg daily. 25 HCTZ added. Follow blood pressure and titrate as OP #3. Right-sided weakness  Secondary to problem #1.  #4 tobacco abuse  Tobacco cessation. Nicotine patch.   Consultants:  Neurology: Dr. Thad Ranger 09/15/2012 Procedures:  MRI head 09/15/2012  2-D  echo 09/16/2012  Carotid Dopplers 09/16/2012  Lower extremity Dopplers 09/16/2012 Antibiotics:  None   Discharge Exam: Filed Vitals:   09/18/12 1329  BP: 149/116  Pulse: 79  Temp:   Resp: 16    General: alert pleasant no focal deficit Cardiovascular: s1 s2 no m/r/g Respiratory: clear Gait not assessed  Discharge Instructions  Discharge Orders   Future Orders Complete By Expires     Diet - low sodium heart healthy  As directed     Discharge instructions  As directed     Comments:      You were cared for by a hospitalist during your hospital stay. If you have any questions about your discharge medications or the care you received while you were in the hospital after you are discharged, you can call the unit and asked to speak with the hospitalist on call if the hospitalist that took care of you is not available. Once you are discharged, your primary care physician will handle any further medical issues. Please note that NO REFILLS for any discharge medications will be authorized once you are discharged, as it is imperative that you return to your primary care physician (or establish a relationship with a primary care physician if you do not have one) for your aftercare needs so that they can reassess your need for medications and monitor your lab values. If you do not have a primary care physician, you can call [phone] for a physician referral.    Increase activity slowly  As directed         Medication List    STOP taking these  medications       ibuprofen 200 MG tablet  Commonly known as:  ADVIL,MOTRIN      TAKE these medications       amLODipine 10 MG tablet  Commonly known as:  NORVASC  Take 1 tablet (10 mg total) by mouth daily.     aspirin 325 MG EC tablet  Take 1 tablet (325 mg total) by mouth daily.     atorvastatin 10 MG tablet  Commonly known as:  LIPITOR  Take 1 tablet (10 mg total) by mouth daily at 6 PM.     hydrochlorothiazide 25 MG tablet  Commonly  known as:  HYDRODIURIL  Take 1 tablet (25 mg total) by mouth daily.     nicotine 21 mg/24hr patch  Commonly known as:  NICODERM CQ - dosed in mg/24 hours  Place 1 patch onto the skin daily.       No Known Allergies    The results of significant diagnostics from this hospitalization (including imaging, microbiology, ancillary and laboratory) are listed below for reference.    Significant Diagnostic Studies: Dg Chest 2 View  09/15/2012   *RADIOLOGY REPORT*  Clinical Data: Stroke  CHEST - 2 VIEW  Comparison: CT chest dated 09/18/2010  Findings: Lungs are clear. No pleural effusion or pneumothorax.  Cardiomediastinal silhouette is within normal limits.  Visualized osseous structures are within normal limits.  IMPRESSION: No evidence of acute cardiopulmonary disease.   Original Report Authenticated By: Charline Bills, M.D.   Ct Head Wo Contrast  09/15/2012   *RADIOLOGY REPORT*  Clinical Data:  Headache, right gray weakness CT HEAD WITHOUT CONTRAST  Technique:  Contiguous axial images were obtained from the base of the skull through the vertex without contrast.  Comparison: Prior head and cervical spine CT 07/19/2010  Findings: No acute intracranial hemorrhage.  Nonspecific focal hypoattenuation in the posterior left frontal white matter extending from the corona radiata cephalad into the centrum semiovale valley.  Gray-white differentiation is preserved throughout. No hydronephrosis or midline shift.  Incidentally, the visualized vessels appear diffusely hyper dense.  This can be seen with dehydration or increased hematocrit levels.  Focal scarring in the right posterior parietal scalp.  The globes and orbits are intact.  No focal calvarial abnormality.  Normal aeration of the mastoid air cells and paranasal sinuses.  IMPRESSION:  Nonspecific linear hypoattenuation within the left posterior frontal white matter as described above. Differential considerations include subacute ischemic insult,  developing sequela of microvascular ischemia, and an acute/subacute demyelinating or infectious/inflammatory process.  A mass lesion is considered significantly less likely given the absence of associated mass effect.  Recommend further evaluation with brain MRI with and without contrast.   Original Report Authenticated By: Malachy Moan, M.D.   Mr [facility] Head Wo Contrast  09/15/2012   **ADDENDUM** CREATED: 09/15/2012 12:49:31  A voice recognition error is present in the impressions.  Impression #1 should read "Acute non hemorrhagic infarct within the left ACA distribution."  **END ADDENDUM** SIGNED BY: Chauncey Fischer, M.D.  09/15/2012   *RADIOLOGY REPORT*  Clinical Data:  Right-sided weakness.  Abnormal CT scan.  MRI HEAD WITHOUT AND WITH CONTRAST MRA HEAD WITHOUT CONTRAST  Technique:  Multiplanar, multiecho pulse sequences of the brain and surrounding structures were obtained without and with intravenous contrast.  Angiographic images of the head were obtained using MRA technique without contrast.  Contrast: 17mL MULTIHANCE GADOBENATE DIMEGLUMINE 529 MG/ML IV SOLN  Comparison:  CT head without contrast 09/15/2012 and 09/18/2010.  MRI HEAD WITHOUT  AND WITH CONTRAST  Findings:  The diffusion weighted images demonstrate restricted diffusion within the left ACA territory in the medial left frontal lobe.  The patient has additional, more remote, white matter lesions bilaterally.  There are additional T2 hyperintensities in the corona radiata bilaterally as well as adjacent to the frontal horns of the lateral ventricles, left greater than right.  There is a remote lacunar infarct of the left caudate head.  Flow is present in the major intracranial arteries.  The globes and orbits are intact.  The right sphenoid sinus is opacified.  The remaining paranasal sinuses in the mastoid air cells are clear.  IMPRESSION:  1.  No acute non hemorrhagic infarct within the left ACA distribution. 2.  Additional bilateral  remote white matter lesions likely represent other areas of more remote ischemia.  The lesion in the left corona radiata corresponds with the CT finding. 3.  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  MRA HEAD  Findings: The internal carotid arteries are within normal limits from high cervical segments through the ICA termini bilaterally.  A 1.5 mm bulge is present along the inferior aspect of the left internal carotid artery at the expected location of the left posterior communicating artery.  The anterior communicating artery is patent.  There is a moderate stenosis of the proximal left A2 segment.  There is a moderate stenosis of the posterior right M2 branch as well.  Mild distal segmental irregularity is present bilaterally.  The left vertebral artery is slightly dominant to the right.  No significant PICA branch vessels are evident.  The AICA branches are dominant.  The basilar artery is normal.  Both posterior cerebral arteries originate from basilar tip.  There is mild attenuation of distal PCA branch vessels.  IMPRESSION:  1.  Moderate proximal left P2 segment stenosis corresponds with the area of infarction. 2.  Moderate stenosis of a proximal right M2 branch. 3.  Mild distal small vessel disease is advanced for age. 4.  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  These results were called by telephone on 09/15/2012 at 10 o'clock a.m. to Dr. Manus Gunning, who verbally acknowledged these results.  Original Report Authenticated By: Marin Roberts, M.D.   Mr Laqueta Jean Wo Contrast  09/15/2012   **ADDENDUM** CREATED: 09/15/2012 12:49:31  A voice recognition error is present in the impressions.  Impression #1 should read "Acute non hemorrhagic infarct within the left ACA distribution."  **END ADDENDUM** SIGNED BY: Chauncey Fischer, M.D.  09/15/2012   *RADIOLOGY REPORT*  Clinical Data:  Right-sided weakness.  Abnormal CT scan.  MRI HEAD  WITHOUT AND WITH CONTRAST MRA HEAD WITHOUT CONTRAST  Technique:  Multiplanar, multiecho pulse sequences of the brain and surrounding structures were obtained without and with intravenous contrast.  Angiographic images of the head were obtained using MRA technique without contrast.  Contrast: 17mL MULTIHANCE GADOBENATE DIMEGLUMINE 529 MG/ML IV SOLN  Comparison:  CT head without contrast 09/15/2012 and 09/18/2010.  MRI HEAD WITHOUT AND WITH CONTRAST  Findings:  The diffusion weighted images demonstrate restricted diffusion within the left ACA territory in the medial left frontal lobe.  The patient has additional, more remote, white matter lesions bilaterally.  There are additional T2 hyperintensities in the corona radiata bilaterally as well as adjacent to the frontal horns of the lateral ventricles, left greater than right.  There is a remote lacunar infarct of the left caudate head.  Flow is present  in the major intracranial arteries.  The globes and orbits are intact.  The right sphenoid sinus is opacified.  The remaining paranasal sinuses in the mastoid air cells are clear.  IMPRESSION:  1.  No acute non hemorrhagic infarct within the left ACA distribution. 2.  Additional bilateral remote white matter lesions likely represent other areas of more remote ischemia.  The lesion in the left corona radiata corresponds with the CT finding. 3.  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  MRA HEAD  Findings: The internal carotid arteries are within normal limits from high cervical segments through the ICA termini bilaterally.  A 1.5 mm bulge is present along the inferior aspect of the left internal carotid artery at the expected location of the left posterior communicating artery.  The anterior communicating artery is patent.  There is a moderate stenosis of the proximal left A2 segment.  There is a moderate stenosis of the posterior right M2 branch as well.  Mild  distal segmental irregularity is present bilaterally.  The left vertebral artery is slightly dominant to the right.  No significant PICA branch vessels are evident.  The AICA branches are dominant.  The basilar artery is normal.  Both posterior cerebral arteries originate from basilar tip.  There is mild attenuation of distal PCA branch vessels.  IMPRESSION:  1.  Moderate proximal left P2 segment stenosis corresponds with the area of infarction. 2.  Moderate stenosis of a proximal right M2 branch. 3.  Mild distal small vessel disease is advanced for age. 4.  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  These results were called by telephone on 09/15/2012 at 10 o'clock a.m. to Dr. Manus Gunning, who verbally acknowledged these results.  Original Report Authenticated By: Marin Roberts, M.D.    Microbiology: No results found for this or any previous visit (from the past 240 hour(s)).   Labs: Basic Metabolic Panel:  Recent Labs Lab 09/15/12 0802 09/15/12 0822 09/15/12 1429 09/16/12 0820 09/17/12 0600 09/18/12 0605  NA 140 142  --  139 140 139  K 4.1 4.1  --  3.5 4.1 4.1  CL 104 106  --  103 104 103  CO2 26  --   --  27 24 28   GLUCOSE 102* 100*  --  153* 94 100*  BUN 17 16  --  16 17 22   CREATININE 1.16 1.20 1.10 1.08 1.14 1.22  CALCIUM 9.7  --   --  9.7 9.9 10.2   Liver Function Tests:  Recent Labs Lab 09/15/12 0802  AST 19  ALT 13  ALKPHOS 83  BILITOT 0.7  PROT 7.0  ALBUMIN 4.2   No results found for this basename: LIPASE, AMYLASE,  in the last 168 hours No results found for this basename: AMMONIA,  in the last 168 hours CBC:  Recent Labs Lab 09/15/12 0802 09/15/12 0822 09/15/12 1429  WBC 5.6  --  6.2  NEUTROABS 3.8  --   --   HGB 14.4 16.0 14.2  HCT 41.6 47.0 41.6  MCV 85.6  --  85.1  PLT 158  --  163   Cardiac Enzymes:  Recent Labs Lab 09/15/12 0802  TROPONINI <0.30   BNP: BNP (last 3 results) No results found for this basename: PROBNP,  in the  last 8760 hours CBG:  Recent Labs Lab 09/15/12 1003  GLUCAP 95       Signed:  Rhetta Mura  [facility] 09/18/2012,  1:36 PM

## 2012-09-18 NOTE — Interval H&P Note (Signed)
History and Physical Interval Note:  09/18/2012 12:32 PM  Henry Simmons  has presented today for surgery, with the diagnosis of STROKE  The various methods of treatment have been discussed with the patient and family. After consideration of risks, benefits and other options for treatment, the patient has consented to  Procedure(s): TRANSESOPHAGEAL ECHOCARDIOGRAM (TEE) (N/A) as a surgical intervention .  The patient's history has been reviewed, patient examined, no change in status, stable for surgery.  I have reviewed the patient's chart and labs.  Questions were answered to the patient's satisfaction.     Ramesha Poster [employer]

## 2012-09-18 NOTE — H&P (View-Only) (Signed)
[facility] PROGRESS NOTE  Henry Simmons MRN:[id number] DOB: 09/22/1970 DOA: 09/15/2012 PCP: Default, Provider, MD  Assessment/Plan: #1 acute left ACA stroke Patient had presented with malignant hypertension with systolic blood pressures in the 200s. Some improvement with blood pressure. Patient also presented with right sided weakness with some improvement. 2-D echo with no source of emboli. Carotid Dopplers with no significant ICA stenosis. Lower extremity Dopplers negative for DVT. LDL is 104. Continue statin. Manage BP. TEE tomorrow. Continue aspirin. Neurology following and appreciate input and recommendations.  #2 malignant hypertension Patient without controlled hypertension. Patient stated was not on any antihypertensive medications prior to admission. Continue Norvasc 10 mg daily. Will start HCTZ. Follow blood pressure and titrate. Monitor.  #3. Right-sided weakness Secondary to problem #1.  #4 tobacco abuse Tobacco cessation. Nicotine patch.  Code Status: Full Family Communication: Updated patient and wife at bedside. Disposition Plan: Home when medically stable.   Consultants:  Neurology: Dr. Reynolds 09/15/2012  Procedures:  MRI head 09/15/2012  2-D echo 09/16/2012  Carotid Dopplers 09/16/2012  Lower extremity Dopplers 09/16/2012  Antibiotics:  None  HPI/Subjective: Patient with no complaints.  Objective: Filed Vitals:   09/16/12 2146 09/16/12 2300 09/17/12 0127 09/17/12 0619  BP: 175/112 162/105 150/101 159/111  Pulse: 84 72 75 89  Temp: 98.5 F (36.9 C)  98.1 F (36.7 C) 97.9 F (36.6 C)  TempSrc: Oral  Oral Oral  Resp: 16  16 16  Height:      Weight:      SpO2: 100%  100% 100%    Intake/Output Summary (Last 24 hours) at 09/17/12 0858 Last data filed at 09/16/12 1800  Gross per 24 hour  Intake    240 ml  Output      0 ml  Net    240 ml   Filed Weights   09/15/12 0744 09/15/12 1301  Weight: 79.379 kg (175 lb) 78.7 kg (173 lb 8 oz)     Exam:   General:  NAD  Cardiovascular: RRR  Respiratory: CTAB  Abdomen: Soft/NT/ND/+BS  Extremities: No c/c/e   Data Reviewed: Basic Metabolic Panel:  Recent Labs Lab 09/15/12 0802 09/15/12 0822 09/15/12 1429 09/16/12 0820 09/17/12 0600  NA 140 142  --  139 140  K 4.1 4.1  --  3.5 4.1  CL 104 106  --  103 104  CO2 26  --   --  27 24  GLUCOSE 102* 100*  --  153* 94  BUN 17 16  --  16 17  CREATININE 1.16 1.20 1.10 1.08 1.14  CALCIUM 9.7  --   --  9.7 9.9   Liver Function Tests:  Recent Labs Lab 09/15/12 0802  AST 19  ALT 13  ALKPHOS 83  BILITOT 0.7  PROT 7.0  ALBUMIN 4.2   No results found for this basename: LIPASE, AMYLASE,  in the last 168 hours No results found for this basename: AMMONIA,  in the last 168 hours CBC:  Recent Labs Lab 09/15/12 0802 09/15/12 0822 09/15/12 1429  WBC 5.6  --  6.2  NEUTROABS 3.8  --   --   HGB 14.4 16.0 14.2  HCT 41.6 47.0 41.6  MCV 85.6  --  85.1  PLT 158  --  163   Cardiac Enzymes:  Recent Labs Lab 09/15/12 0802  TROPONINI <0.30   BNP (last 3 results) No results found for this basename: PROBNP,  in the last 8760 hours CBG:  Recent Labs Lab 09/15/12 1003  GLUCAP   95    No results found for this or any previous visit (from the past 240 hour(s)).   Studies: Dg Chest 2 View  09/15/2012   *RADIOLOGY REPORT*  Clinical Data: Stroke  CHEST - 2 VIEW  Comparison: CT chest dated 09/18/2010  Findings: Lungs are clear. No pleural effusion or pneumothorax.  Cardiomediastinal silhouette is within normal limits.  Visualized osseous structures are within normal limits.  IMPRESSION: No evidence of acute cardiopulmonary disease.   Original Report Authenticated By: Sriyesh Krishnan, M.D.   Mr Mra Head Wo Contrast  09/15/2012   **ADDENDUM** CREATED: 09/15/2012 12:49:31  A voice recognition error is present in the impressions.  Impression #1 should read "Acute non hemorrhagic infarct within the left ACA distribution."   **END ADDENDUM** SIGNED BY: Christopher W. Mattern, M.D.  09/15/2012   *RADIOLOGY REPORT*  Clinical Data:  Right-sided weakness.  Abnormal CT scan.  MRI HEAD WITHOUT AND WITH CONTRAST MRA HEAD WITHOUT CONTRAST  Technique:  Multiplanar, multiecho pulse sequences of the brain and surrounding structures were obtained without and with intravenous contrast.  Angiographic images of the head were obtained using MRA technique without contrast.  Contrast: 17mL MULTIHANCE GADOBENATE DIMEGLUMINE 529 MG/ML IV SOLN  Comparison:  CT head without contrast 09/15/2012 and 09/18/2010.  MRI HEAD WITHOUT AND WITH CONTRAST  Findings:  The diffusion weighted images demonstrate restricted diffusion within the left ACA territory in the medial left frontal lobe.  The patient has additional, more remote, white matter lesions bilaterally.  There are additional T2 hyperintensities in the corona radiata bilaterally as well as adjacent to the frontal horns of the lateral ventricles, left greater than right.  There is a remote lacunar infarct of the left caudate head.  Flow is present in the major intracranial arteries.  The globes and orbits are intact.  The right sphenoid sinus is opacified.  The remaining paranasal sinuses in the mastoid air cells are clear.  IMPRESSION:  1.  No acute non hemorrhagic infarct within the left ACA distribution. 2.  Additional bilateral remote white matter lesions likely represent other areas of more remote ischemia.  The lesion in the left corona radiata corresponds with the CT finding. 3.  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  MRA HEAD  Findings: The internal carotid arteries are within normal limits from high cervical segments through the ICA termini bilaterally.  A 1.5 mm bulge is present along the inferior aspect of the left internal carotid artery at the expected location of the left posterior communicating artery.  The anterior communicating  artery is patent.  There is a moderate stenosis of the proximal left A2 segment.  There is a moderate stenosis of the posterior right M2 branch as well.  Mild distal segmental irregularity is present bilaterally.  The left vertebral artery is slightly dominant to the right.  No significant PICA branch vessels are evident.  The AICA branches are dominant.  The basilar artery is normal.  Both posterior cerebral arteries originate from basilar tip.  There is mild attenuation of distal PCA branch vessels.  IMPRESSION:  1.  Moderate proximal left P2 segment stenosis corresponds with the area of infarction. 2.  Moderate stenosis of a proximal right M2 branch. 3.  Mild distal small vessel disease is advanced for age. 4.  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  These results were called by telephone on 09/15/2012 at 10 o'clock a.m. to Dr. Rancour, who verbally acknowledged   these results.  Original Report Authenticated By: Christopher Mattern, M.D.   Mr Brain W Wo Contrast  09/15/2012   **ADDENDUM** CREATED: 09/15/2012 12:49:31  A voice recognition error is present in the impressions.  Impression #1 should read "Acute non hemorrhagic infarct within the left ACA distribution."  **END ADDENDUM** SIGNED BY: Christopher W. Mattern, M.D.  09/15/2012   *RADIOLOGY REPORT*  Clinical Data:  Right-sided weakness.  Abnormal CT scan.  MRI HEAD WITHOUT AND WITH CONTRAST MRA HEAD WITHOUT CONTRAST  Technique:  Multiplanar, multiecho pulse sequences of the brain and surrounding structures were obtained without and with intravenous contrast.  Angiographic images of the head were obtained using MRA technique without contrast.  Contrast: 17mL MULTIHANCE GADOBENATE DIMEGLUMINE 529 MG/ML IV SOLN  Comparison:  CT head without contrast 09/15/2012 and 09/18/2010.  MRI HEAD WITHOUT AND WITH CONTRAST  Findings:  The diffusion weighted images demonstrate restricted diffusion within the left ACA territory in the medial left frontal lobe.   The patient has additional, more remote, white matter lesions bilaterally.  There are additional T2 hyperintensities in the corona radiata bilaterally as well as adjacent to the frontal horns of the lateral ventricles, left greater than right.  There is a remote lacunar infarct of the left caudate head.  Flow is present in the major intracranial arteries.  The globes and orbits are intact.  The right sphenoid sinus is opacified.  The remaining paranasal sinuses in the mastoid air cells are clear.  IMPRESSION:  1.  No acute non hemorrhagic infarct within the left ACA distribution. 2.  Additional bilateral remote white matter lesions likely represent other areas of more remote ischemia.  The lesion in the left corona radiata corresponds with the CT finding. 3.  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  MRA HEAD  Findings: The internal carotid arteries are within normal limits from high cervical segments through the ICA termini bilaterally.  A 1.5 mm bulge is present along the inferior aspect of the left internal carotid artery at the expected location of the left posterior communicating artery.  The anterior communicating artery is patent.  There is a moderate stenosis of the proximal left A2 segment.  There is a moderate stenosis of the posterior right M2 branch as well.  Mild distal segmental irregularity is present bilaterally.  The left vertebral artery is slightly dominant to the right.  No significant PICA branch vessels are evident.  The AICA branches are dominant.  The basilar artery is normal.  Both posterior cerebral arteries originate from basilar tip.  There is mild attenuation of distal PCA branch vessels.  IMPRESSION:  1.  Moderate proximal left P2 segment stenosis corresponds with the area of infarction. 2.  Moderate stenosis of a proximal right M2 branch. 3.  Mild distal small vessel disease is advanced for age. 4.  1.5 mm left posterior  communicating artery aneurysm.  These results were called by telephone on 09/15/2012 at 10 o'clock a.m. to Dr. Rancour, who verbally acknowledged these results.  Original Report Authenticated By: Christopher Mattern, M.D.    Scheduled Meds: . amLODipine  10 mg Oral Daily  . amLODipine  5 mg Oral Daily  . aspirin EC  325 mg Oral Daily  . atorvastatin  10 mg Oral q1800  . hydrochlorothiazide  25 mg Oral Daily  . nicotine  21 mg Transdermal Daily  . pneumococcal 23 valent vaccine  0.5 mL Intramuscular Tomorrow-1000   Continuous Infusions:     Principal Problem:   Acute left ACA ischemic stroke Active Problems:   Hypertension   Essential hypertension, malignant   Right sided weakness   History of CVA (cerebrovascular accident) without residual deficits   Tobacco use disorder    Time spent: > 30mins    THOMPSON,DANIEL  [facility] Pager [phone]. If 7PM-7AM, please contact night-coverage at [email or web address], password [email or web address] 09/17/2012, 8:58 AM  LOS: 2 days             

## 2012-09-19 ENCOUNTER — Encounter (HOSPITAL_COMMUNITY): Payer: Self-pay | Admitting: Cardiology

## 2012-09-24 ENCOUNTER — Ambulatory Visit (INDEPENDENT_AMBULATORY_CARE_PROVIDER_SITE_OTHER): Payer: 59 | Admitting: Medical

## 2012-09-24 ENCOUNTER — Encounter: Payer: Self-pay | Admitting: Medical

## 2012-09-24 VITALS — BP 152/90 | HR 88 | Temp 98.5°F | Resp 16 | Wt 173.0 lb

## 2012-09-24 DIAGNOSIS — Z733 Stress, not elsewhere classified: Secondary | ICD-10-CM

## 2012-09-24 DIAGNOSIS — I6350 Cerebral infarction due to unspecified occlusion or stenosis of unspecified cerebral artery: Secondary | ICD-10-CM

## 2012-09-24 DIAGNOSIS — I639 Cerebral infarction, unspecified: Secondary | ICD-10-CM

## 2012-09-24 DIAGNOSIS — F439 Reaction to severe stress, unspecified: Secondary | ICD-10-CM

## 2012-09-24 DIAGNOSIS — E785 Hyperlipidemia, unspecified: Secondary | ICD-10-CM

## 2012-09-24 DIAGNOSIS — I10 Essential (primary) hypertension: Secondary | ICD-10-CM

## 2012-09-24 DIAGNOSIS — E786 Lipoprotein deficiency: Secondary | ICD-10-CM
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
## 2012-09-24 NOTE — Patient Instructions (Signed)
Ischemic Stroke A stroke (cerebrovascular accident) is the sudden death of brain tissue. It is a medical emergency. A stroke can cause permanent loss of brain function. This can cause problems with different parts of your body. A transient ischemic attack (TIA) is different because it does not cause permanent damage. A TIA is a short-lived problem of poor blood flow affecting a part of the brain. A TIA is also a serious problem because having a TIA greatly increases the chances of having a stroke. When symptoms first develop, you cannot know if the problem might be a stroke or TIA. CAUSES  A stroke is caused by a decrease of oxygen supply to an area of your brain. It is usually the result of a small blood clot or collection of cholesterol or fat (plaque) that blocks blood flow in the brain. A stroke can also be caused by blocked or damaged carotid arteries.  RISK FACTORS  High blood pressure (hypertension).  High cholesterol.  Diabetes mellitus.  Heart disease.  The build up of plaque in the blood vessels (peripheral artery disease or atherosclerosis).  The build up of plaque in the blood vessels providing blood and oxygen to the brain (carotid artery stenosis).  An abnormal heart rhythm (atrial fibrillation).  Obesity.  Smoking.  Taking oral contraceptives (especially in combination with smoking).  Physical inactivity.  A diet high in fats, salt (sodium), and calories.  Alcohol use.  Use of illegal drugs (especially cocaine and methamphetamine).  Being male.  Being African American.  Being over the age of 55.  Family history of stroke.  Previous history of blood clots, stroke, TIA, or heart attack.  Sickle cell disease. SYMPTOMS  These symptoms usually develop suddenly, or may be newly present upon awakening from sleep:  Sudden weakness or numbness of the face, arm, or leg, especially on one side of the body.  Sudden trouble walking or difficulty moving arms or  legs.  Sudden confusion.  Sudden personality changes.  Trouble speaking (aphasia) or understanding.  Difficulty swallowing.  Sudden trouble seeing in one or both eyes.  Double vision.  Dizziness.  Loss of balance or coordination.  Sudden severe headache with no known cause.  Trouble reading or writing. DIAGNOSIS  Your caregiver can often determine the presence or absence of a stroke based on your symptoms, history, and physical exam. Computed tomography (CT) of the brain is usually performed to confirm the stroke, determine causes, and determine stroke severity. Other tests may be done to find the cause of the stroke. These tests may include:  Electrocardiography.  Continuous heart monitoring.  Echocardiography.  Carotid ultrasonography.  Magnetic resonance imaging (MRI).  A scan of the brain circulation.  Blood tests. PREVENTION  The risk of a stroke can be decreased by appropriately treating high blood pressure, high cholesterol, diabetes, heart disease, and obesity and by quitting smoking, limiting alcohol, and staying physically active. TREATMENT  Time is of the essence. It is important to seek treatment within 3 4 hours of the start of symptoms because you may receive a medicine to dissolve the clot (thrombolytic) that cannot be given after that time. Even if you do not know when your symptoms began, get treatment as soon as possible. After the 4 hour window has passed, treatment may include rest, oxygen, intravenous (IV) fluids, and medicines to thin the blood (anticoagulants). Treatment of stroke depends on the duration, severity, and cause of your symptoms. Medicines and diet may be used to address diabetes, high blood pressure,   and other risk factors. Physical, speech, and occupational therapists will assess you and work to improve any functions impaired by the stroke. Measures will be taken to prevent short-term and long-term complications, including infection from  breathing foreign material into the lungs (aspiration pneumonia), blood clots in the legs, bedsores, and falls. Rarely, surgery may be needed to remove large blood clots or to open up blocked arteries. HOME CARE INSTRUCTIONS   Take all medicines prescribed by your caregiver. Follow the directions carefully. Medicines may be used to control risk factors for a stroke. Be sure you understand all your medicine instructions.  You may be told to take aspirin or the anticoagulant warfarin. Warfarin needs to be taken exactly as instructed.  Too much and too little warfarin are both dangerous. Too much warfarin increases the risk of bleeding. Too little warfarin continues to allow the risk for blood clots. While taking warfarin, you will need to have regular blood tests to measure your blood clotting time. These blood tests usually include both the PT and INR tests. The PT and INR results allow your caregiver to adjust your dose of warfarin. The dose can change for many reasons. It is critically important that you take warfarin exactly as prescribed, and that you have your PT and INR levels drawn exactly as directed.  Many foods, especially foods high in vitamin K can interfere with warfarin and affect the PT and INR results. Foods high in vitamin K include spinach, kale, broccoli, cabbage, collard and turnip greens, brussels sprouts, peas, cauliflower, seaweed, and parsley as well as beef and pork liver, green tea, and soybean oil. You should eat a consistent amount of foods high in vitamin K. Avoid major changes in your diet, or notify your caregiver before changing your diet. Arrange a visit with a dietitian to answer your questions.  Many medicines can interfere with warfarin and affect the PT and INR results. You must tell your caregiver about any and all medicines you take, this includes all vitamins and supplements. Be especially cautious with aspirin and anti-inflammatory medicines. Do not take or  discontinue any prescribed or over-the-counter medicine except on the advice of your caregiver or pharmacist.  Warfarin can have side effects, such as excessive bruising or bleeding. You will need to hold pressure over cuts for longer than usual. Your caregiver or pharmacist will discuss other potential side effects.  Avoid sports or activities that may cause injury or bleeding.  Be mindful when shaving, flossing your teeth, or handling sharp objects.  Alcohol can change the body's ability to handle warfarin. It is best to avoid alcoholic drinks or consume only very small amounts while taking warfarin. Notify your caregiver if you change your alcohol intake.  Notify your dentist or other caregivers before procedures.  If swallow studies have determined that your swallowing reflex is present, you should eat healthy foods. A diet that includes 5 or more servings of fruits and vegetables a day may reduce the risk of stroke. Foods may need to be a special consistency (soft or pureed), or small bites may need to be taken in order to avoid aspirating or choking. Certain diets may be prescribed to address high blood pressure, high cholesterol, diabetes, or obesity.  A low-sodium, low-saturated fat, low-trans fat, low-cholesterol diet is recommended to manage high blood pressure.  A low-saturated fat, low-trans fat, low-cholesterol, and high-fiber diet may control cholesterol levels.  A controlled-carbohydrate, controlled-sugar diet is recommended to manage diabetes.  A reduced-calorie, low-sodium, low-saturated fat, low-trans   fat, low-cholesterol diet is recommended to manage obesity.  Maintain a healthy weight.  Stay physically active. It is recommended that you get at least 30 minutes of activity on most or all days.  Do not smoke.  Limit alcohol use even if you are not taking warfarin. Moderate alcohol use is considered to be:  No more than 2 drinks each day for men.  No more than 1 drink  each day for nonpregnant women.  Stop drug abuse.  Home safety. A safe home environment is important to reduce the risk of falls. Your caregiver may arrange for specialists to evaluate your home. Having grab bars in the bedroom and bathroom is often important. Your caregiver may arrange for equipment to be used at home, such as raised toilets and a seat for the shower.  Physical, occupational, and speech therapy. Ongoing therapy may be needed to maximize your recovery after a stroke. If you have been advised to use a walker or a cane, use it at all times. Be sure to keep your therapy appointments.  Follow all instructions for follow-up with your caregiver. This is very important. This includes any referrals, physical therapy, rehabilitation, and lab tests. Proper follow up can prevent another stroke from occurring. SEEK MEDICAL CARE IF:  You have personality changes.  You have difficulty swallowing.  You are seeing double.  You have dizziness.  You have a fever.  You have skin breakdown. SEEK IMMEDIATE MEDICAL CARE IF:  Any of these symptoms may represent a serious problem that is an emergency. Do not wait to see if the symptoms will go away. Get medical help right away. Call your local emergency services (911 in [place] Do not drive yourself to the hospital.  You have sudden weakness or numbness of the face, arm, or leg, especially on one side of the body.  You have sudden trouble walking or difficulty moving arms or legs.  You have sudden confusion.  You have trouble speaking (aphasia) or understanding.  You have sudden trouble seeing in one or both eyes.  You have a loss of balance or coordination.  You have a sudden, severe headache with no known cause.  You have new chest pain or an irregular heartbeat.  You have a partial or total loss of consciousness.   Document Released: 01/30/2005 Document Revised: 01/17/2012 Document Reviewed: 09/10/2011 [facility] Patient  Information 2014 [place], [place].    Hypertension As your heart beats, it forces blood through your arteries. This force is your blood pressure. If the pressure is too high, it is called hypertension (HTN) or high blood pressure. HTN is dangerous because you may have it and not know it. High blood pressure may mean that your heart has to work harder to pump blood. Your arteries may be narrow or stiff. The extra work puts you at risk for heart disease, stroke, and other problems.  Blood pressure consists of two numbers, a higher number over a lower, 110/72, for example. It is stated as "110 over 72." The ideal is below 120 for the top number (systolic) and under 80 for the bottom (diastolic). Write down your blood pressure today. You should pay close attention to your blood pressure if you have certain conditions such as:  Heart failure.  Prior heart attack.  Diabetes  Chronic kidney disease.  Prior stroke.  Multiple risk factors for heart disease. To see if you have HTN, your blood pressure should be measured while you are seated with your arm held at the  level of the heart. It should be measured at least twice. A one-time elevated blood pressure reading (especially in the Emergency Department) does not mean that you need treatment. There may be conditions in which the blood pressure is different between your right and left arms. It is important to see your caregiver soon for a recheck. Most people have essential hypertension which means that there is not a specific cause. This type of high blood pressure may be lowered by changing lifestyle factors such as:  Stress.  Smoking.  Lack of exercise.  Excessive weight.  Drug/tobacco/alcohol use.  Eating less salt. Most people do not have symptoms from high blood pressure until it has caused damage to the body. Effective treatment can often prevent, delay or reduce that damage. TREATMENT  When a cause has been identified, treatment for  high blood pressure is directed at the cause. There are a large number of medications to treat HTN. These fall into several categories, and your caregiver will help you select the medicines that are best for you. Medications may have side effects. You should review side effects with your caregiver. If your blood pressure stays high after you have made lifestyle changes or started on medicines,   Your medication(s) may need to be changed.  Other problems may need to be addressed.  Be certain you understand your prescriptions, and know how and when to take your medicine.  Be sure to follow up with your caregiver within the time frame advised (usually within two weeks) to have your blood pressure rechecked and to review your medications.  If you are taking more than one medicine to lower your blood pressure, make sure you know how and at what times they should be taken. Taking two medicines at the same time can result in blood pressure that is too low. SEEK IMMEDIATE MEDICAL CARE IF:  You develop a severe headache, blurred or changing vision, or confusion.  You have unusual weakness or numbness, or a faint feeling.  You have severe chest or abdominal pain, vomiting, or breathing problems. MAKE SURE YOU:   Understand these instructions.  Will watch your condition.  Will get help right away if you are not doing well or get worse. Document Released: 01/30/2005 Document Revised: 04/24/2011 Document Reviewed: 09/20/2007 [facility] Patient Information 2014 [place], [place].

## 2012-09-30 ENCOUNTER — Telehealth: Payer: Self-pay | Admitting: Internal Medicine

## 2012-09-30 NOTE — Telephone Encounter (Signed)
Pt states that his work is requiring him to go to the neurologist before he can return to work. Can you refer him to a neurologist

## 2012-10-01 ENCOUNTER — Other Ambulatory Visit: Payer: Self-pay

## 2012-10-01 DIAGNOSIS — I639 Cerebral infarction, unspecified: Secondary | ICD-10-CM

## 2012-10-01 NOTE — Telephone Encounter (Signed)
pls refer to neurology ASAP to get him back to work

## 2012-10-01 NOTE — Telephone Encounter (Signed)
I HAVE PUT IN EPIC TO [facility]

## 2012-10-15 ENCOUNTER — Ambulatory Visit: Payer: 59 | Admitting: Medical

## 2012-10-17 ENCOUNTER — Encounter: Payer: Self-pay | Admitting: Medical

## 2012-10-17 ENCOUNTER — Ambulatory Visit (INDEPENDENT_AMBULATORY_CARE_PROVIDER_SITE_OTHER): Payer: 59 | Admitting: Medical

## 2012-10-17 VITALS — BP 130/82 | HR 90 | Temp 98.5°F | Wt 178.0 lb

## 2012-10-17 DIAGNOSIS — I6350 Cerebral infarction due to unspecified occlusion or stenosis of unspecified cerebral artery: Secondary | ICD-10-CM

## 2012-10-17 DIAGNOSIS — G4700 Insomnia, unspecified: Secondary | ICD-10-CM

## 2012-10-17 DIAGNOSIS — I10 Essential (primary) hypertension: Secondary | ICD-10-CM

## 2012-10-17 DIAGNOSIS — I639 Cerebral infarction, unspecified: Secondary | ICD-10-CM

## 2012-10-17 DIAGNOSIS — E785 Hyperlipidemia, unspecified: Secondary | ICD-10-CM

## 2012-10-17 MED ORDER — CLONAZEPAM 0.5 MG PO TABS: ORAL_TABLET | ORAL | Status: DC
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
## 2012-10-17 NOTE — Patient Instructions (Signed)
Insomnia Insomnia is frequent trouble falling and/or staying asleep. Insomnia can be a long term problem or a short term problem. Both are common. Insomnia can be a short term problem when the wakefulness is related to a certain stress or worry. Long term insomnia is often related to ongoing stress during waking hours and/or poor sleeping habits. Overtime, sleep deprivation itself can make the problem worse. Every little thing feels more severe because you are overtired and your ability to cope is decreased.  CAUSES   Stress, anxiety, and depression.  Poor sleeping habits.  Distractions such as TV in the bedroom.  Naps close to bedtime.  Engaging in emotionally charged conversations before bed.  Technical reading before sleep.  Alcohol and other sedatives. They may make the problem worse. They can hurt normal sleep patterns and normal dream activity.  Stimulants such as caffeine for several hours prior to bedtime.  Pain syndromes and shortness of breath can cause insomnia.  Exercise late at night.  Changing time zones may cause sleeping problems (jet lag).  It is sometimes helpful to have someone observe your sleeping patterns. They should look for periods of not breathing during the night (sleep apnea). They should also look to see how long those periods last. If you live alone or observers are uncertain, you can also be observed at a sleep clinic where your sleep patterns will be professionally monitored. Sleep apnea requires a checkup and treatment. Give your caregivers your medical history. Give your caregivers observations your family has made about your sleep.   SYMPTOMS   Not feeling rested in the morning.  Anxiety and restlessness at bedtime.  Difficulty falling and staying asleep.  TREATMENT   Your caregiver may prescribe treatment for an underlying medical disorders. Your caregiver can give advice or help if you are using alcohol or other drugs for self-medication.  Treatment of underlying problems will usually eliminate insomnia problems.  Medications can be prescribed for short time use. They are generally not recommended for lengthy use.  Over-the-counter sleep medicines are not recommended for lengthy use. They can be habit forming.  You can promote easier sleeping by making lifestyle changes such as the following:  Sleep hygiene  Sleep only as much as you need to feel rested and then get out of bed  Keep a regular sleep schedule.  Aim to go to bed at the same time every night, and set an alarm clock to wake up at a fixed time each morning including weekends  Develop a bedtime ritual. Keep a familiar routine of bathing, brushing your teeth, climbing into bed at the same time each night, listening to soothing music. Routines increase the success of falling to sleep faster.  Use relaxation techniques. This can be using breathing and muscle tension release routines. It can also include visualizing peaceful scenes. You can also help control troubling or intruding thoughts by keeping your mind occupied with boring or repetitive thoughts like the old concept of counting sheep. You can make it more creative like imagining planting one beautiful flower after another in your backyard garden.  During your day, work to eliminate stress. When this is not possible use some of the previous suggestions to help reduce the anxiety that accompanies stressful situations.  Avoid forcing sleep  Exercise regularly at least 20 minutes, preferably 4-5 hours before bedtime  Avoid caffeinated beverages after lunch  Avoid alcohol near bedtime; no "night cap"  Avoid smoking, especially in the evening  Do not go to bed   hungry; work on changing your diet and the time of your last meal. No night time snacks.  Adjust bedroom environment to a cool, quiet, dark room  Deal with you worries before bedtime.  Consider counseling for excessive stress, worry, and life situations.   I can provide resources and contact information for counselors if needed.  Stimulus control  Go to bed only when sleepy  Do not watch television, read, eat, or worry while in bed.  Use bed only for sleep and sex  Stop tedious detailed work at least one hour before bedtime.  Get out of the bed if unable to fall asleep within 20 minutes and go to another room.  Return to bed only when sleepy.  Read or do some quiet activity. Keep the lights down. Wait until you feel sleepy and go back to bed.Repeat this step as many times as necessary throughout the night  Do not take a nap during the day   SLEEP DIARY   Keep a diary. Inform your caregiver about your progress. This includes any medication side effects. See your caregiver regularly. Take note of:  Times when you are asleep.  Times when you are awake during the night.  The quality of your sleep.  How you feel the next day.  This information will help your caregiver care for you.  Bring your sleep diary in at the next visit   

## 2012-10-24 ENCOUNTER — Emergency Department (HOSPITAL_COMMUNITY): Payer: 59

## 2012-10-24 ENCOUNTER — Encounter (HOSPITAL_COMMUNITY): Payer: Self-pay | Admitting: Emergency Medicine

## 2012-10-24 ENCOUNTER — Observation Stay (HOSPITAL_COMMUNITY): Payer: 59

## 2012-10-24 ENCOUNTER — Observation Stay (HOSPITAL_COMMUNITY)
Admission: EM | Admit: 2012-10-24 | Discharge: 2012-10-25 | Disposition: A | Discharge status: Home / Self Care | Payer: 59 | Attending: Internal Medicine | Admitting: Internal Medicine

## 2012-10-24 ENCOUNTER — Other Ambulatory Visit: Payer: Self-pay

## 2012-10-24 DIAGNOSIS — I129 Hypertensive chronic kidney disease with stage 1 through stage 4 chronic kidney disease, or unspecified chronic kidney disease: Secondary | ICD-10-CM | POA: Diagnosis present

## 2012-10-24 DIAGNOSIS — I10 Essential (primary) hypertension: Secondary | ICD-10-CM | POA: Diagnosis present

## 2012-10-24 DIAGNOSIS — R29898 Other symptoms and signs involving the musculoskeletal system: Secondary | ICD-10-CM | POA: Insufficient documentation

## 2012-10-24 DIAGNOSIS — N183 Chronic kidney disease, stage 3 unspecified: Secondary | ICD-10-CM | POA: Diagnosis present

## 2012-10-24 DIAGNOSIS — R55 Syncope and collapse: Secondary | ICD-10-CM

## 2012-10-24 DIAGNOSIS — Z8673 Personal history of transient ischemic attack (TIA), and cerebral infarction without residual deficits: Secondary | ICD-10-CM

## 2012-10-24 DIAGNOSIS — R531 Weakness: Secondary | ICD-10-CM

## 2012-10-24 DIAGNOSIS — I639 Cerebral infarction, unspecified: Secondary | ICD-10-CM | POA: Diagnosis present

## 2012-10-24 DIAGNOSIS — R918 Other nonspecific abnormal finding of lung field: Secondary | ICD-10-CM

## 2012-10-24 DIAGNOSIS — I63522 Cerebral infarction due to unspecified occlusion or stenosis of left anterior cerebral artery: Secondary | ICD-10-CM

## 2012-10-24 DIAGNOSIS — I6990 Unspecified sequelae of unspecified cerebrovascular disease: Secondary | ICD-10-CM

## 2012-10-24 DIAGNOSIS — F17200 Nicotine dependence, unspecified, uncomplicated: Secondary | ICD-10-CM

## 2012-10-24 DIAGNOSIS — E785 Hyperlipidemia, unspecified: Secondary | ICD-10-CM

## 2012-10-24 DIAGNOSIS — I69998 Other sequelae following unspecified cerebrovascular disease: Secondary | ICD-10-CM | POA: Insufficient documentation

## 2012-10-24 HISTORY — DX: Unspecified convulsions: R56.9

## 2012-10-24 LAB — GLUCOSE, CAPILLARY: Glucose-Capillary: 94 mg/dL (ref 70–99)

## 2012-10-24 LAB — CBC
HCT: 38.5 % — ABNORMAL LOW (ref 39.0–52.0)
Hemoglobin: 13 g/dL (ref 13.0–17.0)
MCV: 83.9 fL (ref 78.0–100.0)
Platelets: 206 10*3/uL (ref 150–400)
RBC: 4.59 MIL/uL (ref 4.22–5.81)
WBC: 7.8 10*3/uL (ref 4.0–10.5)

## 2012-10-24 LAB — URINALYSIS, ROUTINE W REFLEX MICROSCOPIC
Bilirubin Urine: NEGATIVE
Nitrite: NEGATIVE
Specific Gravity, Urine: 1.015 (ref 1.005–1.030)
Urobilinogen, UA: 0.2 mg/dL (ref 0.0–1.0)

## 2012-10-24 LAB — CBC WITH DIFFERENTIAL/PLATELET
Basophils Absolute: 0 10*3/uL (ref 0.0–0.1)
Eosinophils Absolute: 0 10*3/uL (ref 0.0–0.7)
Eosinophils Relative: 0 % (ref 0–5)
Lymphocytes Relative: 10 % — ABNORMAL LOW (ref 12–46)
MCV: 85.6 fL (ref 78.0–100.0)
Platelets: 192 10*3/uL (ref 150–400)
RDW: 13.7 % (ref 11.5–15.5)
WBC: 8.1 10*3/uL (ref 4.0–10.5)

## 2012-10-24 LAB — COMPREHENSIVE METABOLIC PANEL
ALT: 16 U/L (ref 0–53)
AST: 21 U/L (ref 0–37)
CO2: 28 mEq/L (ref 19–32)
Calcium: 10.1 mg/dL (ref 8.4–10.5)
Sodium: 138 mEq/L (ref 135–145)
Total Protein: 7.4 g/dL (ref 6.0–8.3)

## 2012-10-24 LAB — RAPID URINE DRUG SCREEN, HOSP PERFORMED
Barbiturates: NOT DETECTED
Cocaine: NOT DETECTED
Tetrahydrocannabinol: NOT DETECTED

## 2012-10-24 LAB — CREATININE, SERUM: GFR calc Af Amer: 86 mL/min — ABNORMAL LOW (ref >90)

## 2012-10-24 LAB — PROTIME-INR: INR: 1.04 (ref 0.00–1.49)

## 2012-10-24 MED ORDER — ONDANSETRON HCL 4 MG PO TABS: 4.0000 mg | ORAL_TABLET | Freq: Four times a day (QID) | ORAL | Status: DC | PRN

## 2012-10-24 MED ORDER — INFLUENZA VAC SPLIT QUAD 0.5 ML IM SUSP: 0.5000 mL | INTRAMUSCULAR | Status: DC

## 2012-10-24 MED ORDER — ATORVASTATIN CALCIUM 10 MG PO TABS
10.0000 mg | ORAL_TABLET | Freq: Every day | ORAL | Status: DC
  Filled 2012-10-24: qty 1

## 2012-10-24 MED ORDER — HYDROCODONE-ACETAMINOPHEN 5-325 MG PO TABS: 1.0000 | ORAL_TABLET | ORAL | Status: DC | PRN

## 2012-10-24 MED ORDER — HYDROCHLOROTHIAZIDE 25 MG PO TABS
25.0000 mg | ORAL_TABLET | Freq: Every day | ORAL | Status: DC
  Administered 2012-10-25: 25 mg via ORAL
  Filled 2012-10-24: qty 1

## 2012-10-24 MED ORDER — CLOPIDOGREL BISULFATE 75 MG PO TABS
75.0000 mg | ORAL_TABLET | Freq: Every day | ORAL | Status: DC
  Administered 2012-10-25: 75 mg via ORAL
  Filled 2012-10-24 (×2): qty 1

## 2012-10-24 MED ORDER — CLONAZEPAM 0.5 MG PO TABS: 0.5000 mg | ORAL_TABLET | Freq: Two times a day (BID) | ORAL | Status: DC | PRN

## 2012-10-24 MED ORDER — SENNOSIDES-DOCUSATE SODIUM 8.6-50 MG PO TABS: 1.0000 | ORAL_TABLET | Freq: Every evening | ORAL | Status: DC | PRN

## 2012-10-24 MED ORDER — AMLODIPINE BESYLATE 10 MG PO TABS
10.0000 mg | ORAL_TABLET | Freq: Every day | ORAL | Status: DC
  Administered 2012-10-25: 10 mg via ORAL
  Filled 2012-10-24: qty 1

## 2012-10-24 MED ORDER — ONDANSETRON HCL 4 MG/2ML IJ SOLN: 4.0000 mg | Freq: Four times a day (QID) | INTRAMUSCULAR | Status: DC | PRN

## 2012-10-24 MED ORDER — ENOXAPARIN SODIUM 40 MG/0.4ML ~~LOC~~ SOLN
40.0000 mg | SUBCUTANEOUS | Status: DC
  Administered 2012-10-25: 40 mg via SUBCUTANEOUS
  Filled 2012-10-24 (×2): qty 0.4

## 2012-10-24 MED ORDER — LISINOPRIL 5 MG PO TABS
5.0000 mg | ORAL_TABLET | Freq: Every day | ORAL | Status: DC
  Administered 2012-10-24 – 2012-10-25 (×2): 5 mg via ORAL
  Filled 2012-10-24 (×2): qty 1

## 2012-10-24 NOTE — ED Notes (Signed)
POCT CBG resulted [id number]
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
## 2012-10-24 NOTE — ED Notes (Addendum)
Was in court got hot and passed out states was shaking after states that last time he had sz he had stroke w/ rt sided weakness does have rt side residual weakness not post ictal upon  ems arrival has 18 rt ac

## 2012-10-25 DIAGNOSIS — I6350 Cerebral infarction due to unspecified occlusion or stenosis of unspecified cerebral artery: Secondary | ICD-10-CM

## 2012-10-25 LAB — BASIC METABOLIC PANEL
CO2: 28 mEq/L (ref 19–32)
Calcium: 9.9 mg/dL (ref 8.4–10.5)
Potassium: 3.9 mEq/L (ref 3.5–5.1)
Sodium: 139 mEq/L (ref 135–145)

## 2012-10-25 LAB — LIPID PANEL
Cholesterol: 123 mg/dL (ref 0–200)
HDL: 32 mg/dL — ABNORMAL LOW (ref >39)
LDL Cholesterol: 78 mg/dL (ref 0–99)
Total CHOL/HDL Ratio: 3.8 RATIO
Triglycerides: 63 mg/dL (ref <150)
VLDL: 13 mg/dL (ref 0–40)

## 2012-10-25 LAB — CBC
Hemoglobin: 12.5 g/dL — ABNORMAL LOW (ref 13.0–17.0)
MCV: 84.1 fL (ref 78.0–100.0)
Platelets: 196 10*3/uL (ref 150–400)
RBC: 4.4 MIL/uL (ref 4.22–5.81)
WBC: 6.6 10*3/uL (ref 4.0–10.5)

## 2012-10-25 MED ORDER — ASPIRIN 325 MG PO TBEC: 325.0000 mg | DELAYED_RELEASE_TABLET | Freq: Every day | ORAL | Status: DC
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
## 2012-10-25 NOTE — Progress Notes (Signed)
Discharge instructions given. Pt verbalized understanding all questions were answered.  
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
## 2012-10-25 NOTE — Evaluation (Addendum)
Physical Therapy Evaluation Patient Details Name: Henry Simmons MRN: [id number] DOB: 1970/06/13 Today's Date: 10/25/2012 Time: 4401-0272 PT Time Calculation (min): 6 min  PT Assessment / Plan / Recommendation History of Present Illness  Pt adm after syncopal episode with Rt sided weakness. MRI - Expected evolution of left ACA infarct seen on 10/12/2012. Trace if any superimposed acute ischemia at that site now. No mass effect or malignant hemorrhagic transformation.  Clinical Impression  Pt doing well.  No mobility deficits. No further PT needed.    PT Assessment  Patent does not need any further PT services    Follow Up Recommendations  No PT follow up    Does the patient have the potential to tolerate intense rehabilitation      Barriers to Discharge        Equipment Recommendations  None recommended by PT    Recommendations for Other Services     Frequency      Precautions / Restrictions Precautions Precautions: None Restrictions Weight Bearing Restrictions: No   Pertinent Vitals/Pain See flow sheet.      Mobility  Bed Mobility Supine to Sit: 7: Independent Sitting - Scoot to Edge of Bed: 7: Independent Sit to Supine: 7: Independent Transfers Sit to Stand: 7: Independent;From bed Stand to Sit: 7: Independent;To bed Ambulation/Gait Ambulation/Gait Assistance: 7: Independent Ambulation Distance (Feet): 600 Feet Assistive device: None Gait Pattern: Within Functional Limits Gait velocity: normal Stairs: Yes Stairs Assistance: 7: Independent Stair Management Technique: No rails;Alternating pattern;Forwards Number of Stairs: 5 Modified Rankin (Stroke Patients Only) Pre-Morbid Rankin Score: No symptoms Modified Rankin: No symptoms    Exercises     PT Diagnosis:    PT Problem List:   PT Treatment Interventions:       PT Goals(Current goals can be found in the care plan section) Acute Rehab PT Goals PT Goal Formulation: No goals set, d/c  therapy  Visit Information  Last PT Received On: 10/25/12 Assistance Needed: +1 History of Present Illness: Pt adm after syncopal episode with Rt sided weakness. MRI - Expected evolution of left ACA infarct seen on 10/12/2012. Trace if any superimposed acute ischemia at that site now. No mass effect or malignant hemorrhagic transformation.       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  Has been back at work since CVA in August. Communication Communication: No difficulties Dominant Hand: Right    Cognition  Cognition Arousal/Alertness: Awake/alert Behavior During Therapy: WFL for tasks assessed/performed Overall Cognitive Status: History of cognitive impairments - at baseline    Extremity/Trunk Assessment Lower Extremity Assessment Lower Extremity Assessment: Overall WFL for tasks assessed Cervical / Trunk Assessment Cervical / Trunk Assessment: Normal   Balance Static Standing Balance Static Standing - Balance Support: No upper extremity supported Static Standing - Level of Assistance: 7: Independent Dynamic Standing Balance Dynamic Standing - Balance Support: No upper extremity supported Dynamic Standing - Level of Assistance: 7: Independent  End of Session PT - End of Session Activity Tolerance: Patient tolerated treatment well Patient left: in bed;with call bell/phone within reach;with bed alarm set Nurse Communication: Mobility status  GP Functional Assessment Tool Used: clinical judgement Functional Limitation: Mobility: Walking and moving around Mobility: Walking and Moving Around Current Status (Z3664): 0 percent  impaired, limited or  restricted Mobility: Walking and Moving Around Goal Status [phone]): 0 percent impaired, limited or restricted Mobility: Walking and Moving Around Discharge Status [phone]): 0 percent impaired, limited or restricted   [facility] 10/25/2012, 12:05 PM  [facility] PT [phone]

## 2012-10-25 NOTE — Discharge Summary (Signed)
Physician Discharge Summary  SUE MCALEXANDER HYQ:[id number] DOB: [birth year] DOA: 10/24/2012  PCP: No PCP Per Patient  Admit date: 10/24/2012 Discharge date: 10/25/2012  Time spent: >25 minutes  minutes  Recommendations for Outpatient Follow-up:  -Follow up with PCP in 1-2 week -f/u with neurology in 3-4 weeks  Discharge Diagnoses:  Active Problems:   Hypertension   Essential hypertension, malignant   Right sided weakness   CVA (cerebral infarction)   Dyslipidemia   Discharge Condition: stable   Diet recommendation: heart healthy   Filed Weights   10/24/12 1500  Weight: 77.565 kg (171 lb)    History of present illness:  42 y.o. male with history of cerebrovascular accident who was recently admitted to our service on 09/15/2012, at which time patient presented with right-sided weakness. An MRI of brain done during that hospitalization revealed an acute infarct in the left [facility] distribution   Hospital Course:  1. History of left ACA distribution CVA with ? New CVA vs progression; vs seizure  -symptoms resolved  -MRI: Expected evolution of left ACA infarct seen on 10/12/2012. Trace if any superimposed acute ischemia at that site now  D/w Dr. Pearlean Brownie who recommended to continue the same regimen with ASA; Cont statin, control BP;  2. Hypertension  -continue 10 mg by mouth daily of Norvasc and  hydrochlorothiazide  3. Dyslipidemia  Continue atorvastatin 10 mg by mouth daily     Procedures:  MRI  (i.e. Studies not automatically included, echos, thoracentesis, etc; not x-rays)  Consultations:  neurology   Discharge Exam: Filed Vitals:   10/25/12 1056  BP: 136/90  Pulse:   Temp:   Resp:     General: alert, orienetd  Cardiovascular: S1,S2 RRR Respiratory: CTA BL   Discharge Instructions  Discharge Orders   Future Appointments Provider Department Dept Phone   12/26/2012 2:45 PM Micki Riley, MD [facility] [phone]   Future Orders Complete  By Expires   Diet - low sodium heart healthy  As directed    Discharge instructions  As directed    Comments:     Follow up with Primary care doctor in 1 week   Increase activity slowly  As directed        Medication List         amLODipine 10 MG tablet  Commonly known as:  NORVASC  Take 1 tablet (10 mg total) by mouth daily.     aspirin 325 MG EC tablet  Take 1 tablet (325 mg total) by mouth daily.     atorvastatin 10 MG tablet  Commonly known as:  LIPITOR  Take 1 tablet (10 mg total) by mouth daily at 6 PM.     clonazePAM 0.5 MG tablet  Commonly known as:  KLONOPIN  1/2-1 tablet at bedtime for sleep     hydrochlorothiazide 25 MG tablet  Commonly known as:  HYDRODIURIL  Take 1 tablet (25 mg total) by mouth daily.     nicotine 21 mg/24hr patch  Commonly known as:  NICODERM CQ - dosed in mg/24 hours  Place 1 patch onto the skin daily.       No Known Allergies     Follow-up Information   Follow up with No PCP Per Patient In 1 week.   Specialty:  General Practice   Contact information:   [address] [name] [address] [phone]        The results of significant diagnostics from this hospitalization (including imaging, microbiology, ancillary and  laboratory) are listed below for reference.    Significant Diagnostic Studies: Ct Head Wo Contrast  10/24/2012   *RADIOLOGY REPORT*  Clinical Data: Acute onset right-sided weakness and questionable seizure  CT HEAD WITHOUT CONTRAST  Technique:  Contiguous axial images were obtained from the base of the skull through the vertex without contrast. Study was obtained within 24 hours of patient arrival at the emergency department.  Comparison: Brain CT September 15, 2012 and brain MRI September 15, 2012  Findings: The ventricles are normal in size and configuration. There is no appreciable mass, hemorrhage, extra-axial fluid collection, midline shift.  There is a new focus of decreased attenuation in the left parafalcine  region near the frontal - parietal junction, best seen on this axial slices 20, 21, and 22. This area, which measures 2.4 x 1.6 cm in size, is concerning for potential acute infarct.  There are patchy areas of small vessel disease adjacent to the frontal horn of the left lateral ventricle and both centra semiovale which appears stable.  Bony calvarium appears intact.  The mastoid air cells are clear.  IMPRESSION: New area of decreased attenuation in the left parafalcine region at the frontal - parietal junction.  Suspect an acute infarct in this area.  Other areas of small vessel disease are stable. No hemorrhage seen.   Original Report Authenticated By: Bretta Bang, M.D.   Mr Brain Wo Contrast  10/24/2012   CLINICAL DATA:  42 year old male with diaphoresis, of syncope. History of stroke.  EXAM: MRI HEAD WITHOUT CONTRAST  TECHNIQUE: Multiplanar, multisequence MR imaging was performed. No intravenous contrast was administered.  COMPARISON:  09/15/2012 and earlier.  FINDINGS: Interval evolution of the left ACA infarct affecting the cingulate gyrus and inferior aspect of the superior frontal gyrus identified on 09/15/2012. Gliosis and developing encephalomalacia. Trace associated petechial blood products. Diffusion images today suggest there is only a small linear area of restricted diffusion along the margins of this lesion (series 400, image 21).  No diffusion abnormality in any other vascular territory. No midline shift, mass effect, evidence of mass lesion, ventriculomegaly, extra-axial collection or acute intracranial hemorrhage. Cervicomedullary junction and pituitary are within normal limits. Negative visualized cervical spine. Major intracranial vascular flow voids are stable.  Outside of the left ACA territory, stable scattered nonspecific white matter T2 and FLAIR hyperintensity. Deep gray matter nuclei brainstem and cerebellum remain within normal limits. Grossly negative visualized internal auditory  structures.  Visualized orbit soft tissues are within normal limits. Visualized paranasal sinuses and mastoids are clear. Normal bone marrow signal. Negative scalp soft tissues.  IMPRESSION: 1. Expected evolution of left ACA infarct seen on 10/12/2012. Trace if any superimposed acute ischemia at that site now. No mass effect or malignant hemorrhagic transformation.  2. No new intracranial abnormality.   Electronically Signed   By: Augusto Gamble M.D.   On: 10/24/2012 19:32    Microbiology: No results found for this or any previous visit (from the past 240 hour(s)).   Labs: Basic Metabolic Panel:  Recent Labs Lab 10/24/12 0957 10/24/12 1954 10/25/12 0605  NA 138  --  139  K 3.7  --  3.9  CL 100  --  101  CO2 28  --  28  GLUCOSE 103*  --  94  BUN 17  --  21  CREATININE 1.14 1.18 1.27  CALCIUM 10.1  --  9.9   Liver Function Tests:  Recent Labs Lab 10/24/12 0957  AST 21  ALT 16  ALKPHOS 69  BILITOT 0.5  PROT 7.4  ALBUMIN 4.5   No results found for this basename: LIPASE, AMYLASE,  in the last 168 hours No results found for this basename: AMMONIA,  in the last 168 hours CBC:  Recent Labs Lab 10/24/12 0957 10/24/12 1954 10/25/12 0605  WBC 8.1 7.8 6.6  NEUTROABS 6.9  --   --   HGB 12.6* 13.0 12.5*  HCT 38.5* 38.5* 37.0*  MCV 85.6 83.9 84.1  PLT 192 206 196   Cardiac Enzymes:  Recent Labs Lab 10/24/12 1129  TROPONINI <0.30   BNP: BNP (last 3 results) No results found for this basename: PROBNP,  in the last 8760 hours CBG:  Recent Labs Lab 10/24/12 1059  GLUCAP 94       Signed:  Trinten Boudoin N  [facility] 10/25/2012, 11:47 AM

## 2012-10-25 NOTE — Progress Notes (Signed)
[facility] PROGRESS NOTE  [facility] ZOX:[id number] DOB: May 27, 1970 DOA: 10/24/2012 PCP: No PCP Per Patient  Assessment/Plan: 42 y.o. male with history of cerebrovascular accident who was recently admitted to our service on 09/15/2012, at which time patient presented with right-sided weakness. An MRI of brain done during that hospitalization revealed an acute infarct in the left ACA distribution  1. History of left ACA distribution CVA with ? New CVA vs progression; vs seizure  -symptoms resolved  -MRI: Expected evolution of left ACA infarct seen on 10/12/2012. Trace if any superimposed acute ischemia at that site now -ASA changed to Plavix. Cont statin, control BP; -recommendations from neurology are appriciated. Possible d/c today  2. Hypertension  -continue 10 mg by mouth daily of Norvasc and 25 mg by mouth daily of hydrochlorothiazide  3. Dyslipidemia  Continue atorvastatin 10 mg by mouth daily.     Code Status: full  Family Communication: family no present at the bedside  Disposition Plan: home likely today    Consultants:  Neurology   Procedures:  MRI head   Antibiotics: no  HPI/Subjective: Alert, oriented   Objective: Filed Vitals:   10/25/12 1056  BP: 136/90  Pulse:   Temp:   Resp:     Intake/Output Summary (Last 24 hours) at 10/25/12 1107 Last data filed at 10/25/12 0800  Gross per 24 hour  Intake    120 ml  Output   1000 ml  Net   -880 ml   Filed Weights   10/24/12 1500  Weight: 77.565 kg (171 lb)    Exam:   General:  Alert, orinedt, awake   Cardiovascular: S1, S2 RRR  Respiratory: CTA BL   Abdomen: soft, NT, ND   Musculoskeletal: no LE edema    Data Reviewed: Basic Metabolic Panel:  Recent Labs Lab 10/24/12 0957 10/24/12 1954 10/25/12 0605  NA 138  --  139  K 3.7  --  3.9  CL 100  --  101  CO2 28  --  28  GLUCOSE 103*  --  94  BUN 17  --  21  CREATININE 1.14 1.18 1.27  CALCIUM 10.1  --  9.9   Liver Function  Tests:  Recent Labs Lab 10/24/12 0957  AST 21  ALT 16  ALKPHOS 69  BILITOT 0.5  PROT 7.4  ALBUMIN 4.5   No results found for this basename: LIPASE, AMYLASE,  in the last 168 hours No results found for this basename: AMMONIA,  in the last 168 hours CBC:  Recent Labs Lab 10/24/12 0957 10/24/12 1954 10/25/12 0605  WBC 8.1 7.8 6.6  NEUTROABS 6.9  --   --   HGB 12.6* 13.0 12.5*  HCT 38.5* 38.5* 37.0*  MCV 85.6 83.9 84.1  PLT 192 206 196   Cardiac Enzymes:  Recent Labs Lab 10/24/12 1129  TROPONINI <0.30   BNP (last 3 results) No results found for this basename: PROBNP,  in the last 8760 hours CBG:  Recent Labs Lab 10/24/12 1059  GLUCAP 94    No results found for this or any previous visit (from the past 240 hour(s)).   Studies: Ct Head Wo Contrast  10/24/2012   *RADIOLOGY REPORT*  Clinical Data: Acute onset right-sided weakness and questionable seizure  CT HEAD WITHOUT CONTRAST  Technique:  Contiguous axial images were obtained from the base of the skull through the vertex without contrast. Study was obtained within 24 hours of patient arrival at the emergency department.  Comparison: Brain CT September 15, 2012 and brain MRI September 15, 2012  Findings: The ventricles are normal in size and configuration. There is no appreciable mass, hemorrhage, extra-axial fluid collection, midline shift.  There is a new focus of decreased attenuation in the left parafalcine region near the frontal - parietal junction, best seen on this axial slices 20, 21, and 22. This area, which measures 2.4 x 1.6 cm in size, is concerning for potential acute infarct.  There are patchy areas of small vessel disease adjacent to the frontal horn of the left lateral ventricle and both centra semiovale which appears stable.  Bony calvarium appears intact.  The mastoid air cells are clear.  IMPRESSION: New area of decreased attenuation in the left parafalcine region at the frontal - parietal junction.  Suspect an  acute infarct in this area.  Other areas of small vessel disease are stable. No hemorrhage seen.   Original Report Authenticated By: Bretta Bang, M.D.   Mr Brain Wo Contrast  10/24/2012   CLINICAL DATA:  43 year old male with diaphoresis, of syncope. History of stroke.  EXAM: MRI HEAD WITHOUT CONTRAST  TECHNIQUE: Multiplanar, multisequence MR imaging was performed. No intravenous contrast was administered.  COMPARISON:  09/15/2012 and earlier.  FINDINGS: Interval evolution of the left ACA infarct affecting the cingulate gyrus and inferior aspect of the superior frontal gyrus identified on 09/15/2012. Gliosis and developing encephalomalacia. Trace associated petechial blood products. Diffusion images today suggest there is only a small linear area of restricted diffusion along the margins of this lesion (series 400, image 21).  No diffusion abnormality in any other vascular territory. No midline shift, mass effect, evidence of mass lesion, ventriculomegaly, extra-axial collection or acute intracranial hemorrhage. Cervicomedullary junction and pituitary are within normal limits. Negative visualized cervical spine. Major intracranial vascular flow voids are stable.  Outside of the left ACA territory, stable scattered nonspecific white matter T2 and FLAIR hyperintensity. Deep gray matter nuclei brainstem and cerebellum remain within normal limits. Grossly negative visualized internal auditory structures.  Visualized orbit soft tissues are within normal limits. Visualized paranasal sinuses and mastoids are clear. Normal bone marrow signal. Negative scalp soft tissues.  IMPRESSION: 1. Expected evolution of left ACA infarct seen on 10/12/2012. Trace if any superimposed acute ischemia at that site now. No mass effect or malignant hemorrhagic transformation.  2. No new intracranial abnormality.   Electronically Signed   By: Augusto Gamble M.D.   On: 10/24/2012 19:32    Scheduled Meds: . amLODipine  10 mg Oral Daily   . atorvastatin  10 mg Oral q1800  . clopidogrel  75 mg Oral Q breakfast  . enoxaparin (LOVENOX) injection  40 mg Subcutaneous Q24H  . hydrochlorothiazide  25 mg Oral Daily  . lisinopril  5 mg Oral Daily   Continuous Infusions:   Active Problems:   Hypertension   Essential hypertension, malignant   Right sided weakness   CVA (cerebral infarction)   Dyslipidemia    Time spent: > 25 minutes     Esperanza Sheets  [facility] Pager [phone]. If 7PM-7AM, please contact night-coverage at [email or web address], password [facility] 10/25/2012, 11:07 AM  LOS: 1 day

## 2012-10-25 NOTE — Progress Notes (Signed)
Stroke Team Progress Note  HISTORY Henry Simmons is an 42 y.o. male who recently was diagnosed with stroke on 09/16/12. Stroke workup was negative at that time and included TEE and hypercoag panel. patient was in court today when he suddenly became very hot and diaphoretic, he then slumped over to the right and lost consciousness. He does not know how long he was unconscious. He believes it was for a few minutes and did not return to full baseline or about 15 minutes. He was brought to the ED where he currently feels back to baseline. HE does have some decreased sensation and decreased strength on the right arm and leg but this was secondary to his previous stroke.   Patient was not a TPA candidate secondary to stroke within 90 days. He was admitted for further evaluation and treatment.  SUBJECTIVE No family is at the bedside.  Overall he feels his condition is back to baseline.   OBJECTIVE Most recent Vital Signs: Filed Vitals:   10/25/12 0350 10/25/12 0535 10/25/12 1055 10/25/12 1056  BP: 108/57 122/88 139/94 136/90  Pulse: 68 81 70   Temp: 98.3 F (36.8 C) 98.6 F (37 C) 98.6 F (37 C)   TempSrc: Oral Oral Oral   Resp: 20 20 20    Height:      Weight:      SpO2: 100% 100% 100%    CBG (last 3)   Recent Labs  10/24/12 1059  GLUCAP 94    IV Fluid Intake:     MEDICATIONS  . amLODipine  10 mg Oral Daily  . atorvastatin  10 mg Oral q1800  . clopidogrel  75 mg Oral Q breakfast  . enoxaparin (LOVENOX) injection  40 mg Subcutaneous Q24H  . hydrochlorothiazide  25 mg Oral Daily  . lisinopril  5 mg Oral Daily   PRN:  clonazePAM, HYDROcodone-acetaminophen, ondansetron (ZOFRAN) IV, ondansetron, senna-docusate  Diet:  Cardiac thin liquids Activity: as tolerated DVT Prophylaxis:  Lovenox 40 mg sq daily   CLINICALLY SIGNIFICANT STUDIES Basic Metabolic Panel:   Recent Labs Lab 10/24/12 0957 10/24/12 1954 10/25/12 0605  NA 138  --  139  K 3.7  --  3.9  CL 100  --  101  CO2  28  --  28  GLUCOSE 103*  --  94  BUN 17  --  21  CREATININE 1.14 1.18 1.27  CALCIUM 10.1  --  9.9   Liver Function Tests:   Recent Labs Lab 10/24/12 0957  AST 21  ALT 16  ALKPHOS 69  BILITOT 0.5  PROT 7.4  ALBUMIN 4.5   CBC:   Recent Labs Lab 10/24/12 0957 10/24/12 1954 10/25/12 0605  WBC 8.1 7.8 6.6  NEUTROABS 6.9  --   --   HGB 12.6* 13.0 12.5*  HCT 38.5* 38.5* 37.0*  MCV 85.6 83.9 84.1  PLT 192 206 196   Coagulation:   Recent Labs Lab 10/24/12 0957  LABPROT 13.4  INR 1.04   Cardiac Enzymes:   Recent Labs Lab 10/24/12 1129  TROPONINI <0.30   Urinalysis:   Recent Labs Lab 10/24/12 1040  COLORURINE YELLOW  LABSPEC 1.015  PHURINE 7.5  GLUCOSEU NEGATIVE  HGBUR NEGATIVE  BILIRUBINUR NEGATIVE  KETONESUR NEGATIVE  PROTEINUR NEGATIVE  UROBILINOGEN 0.2  NITRITE NEGATIVE  LEUKOCYTESUR NEGATIVE   Lipid Panel    Component Value Date/Time   CHOL 123 10/25/2012 0605   TRIG 63 10/25/2012 0605   HDL 32* 10/25/2012 0605   CHOLHDL 3.8  10/25/2012 0605   VLDL 13 10/25/2012 0605   LDLCALC 78 10/25/2012 0605   HgbA1C  Lab Results  Component Value Date   HGBA1C 5.2 09/16/2012    Urine Drug Screen:     Component Value Date/Time   LABOPIA NONE DETECTED 10/24/2012 1040   COCAINSCRNUR NONE DETECTED 10/24/2012 1040   LABBENZ NONE DETECTED 10/24/2012 1040   AMPHETMU NONE DETECTED 10/24/2012 1040   THCU NONE DETECTED 10/24/2012 1040   LABBARB NONE DETECTED 10/24/2012 1040    Alcohol Level: No results found for this basename: ETH,  in the last 168 hours  CT of the brain  10/24/2012    New area of decreased attenuation in the left parafalcine region at the frontal - parietal junction.  Suspect an acute infarct in this area.  Other areas of small vessel disease are stable. No hemorrhage seen.     MRI of the brain  10/24/2012    1. Expected evolution of left ACA infarct seen on 10/12/2012. Trace if any superimposed acute ischemia at that site now. No mass effect or  malignant hemorrhagic transformation.  2. No new intracranial abnormality.     EKG  normal sinus rhythm, LVH  Therapy Recommendations no PT needs  Physical Exam   Awake alert. Afebrile. Head is nontraumatic. Neck is supple without bruit. Hearing is normal. Cardiac exam no murmur or gallop. Lungs are clear to auscultation. Distal pulses are well felt. Neurological Exam ;  Awake  Alert oriented x 3. Normal speech and language.eye movements full without nystagmus.fundi were not visualized. Vision acuity and fields appear normal. Hearing is normal. Palatal movements are normal. Face symmetric. Tongue midline. Normal strength, tone, reflexes and coordination except mild right hip flexor and ankle dorsiflexor weakness.. Normal sensation. Gait deferred. ASSESSMENT [name] is a 42 y.o. male presenting with syncope with loss of consciousness, unable to move right arm and leg during court proceedings in patient with L ACA stroke Aug 2014. Imaging confirms no new stroke; expected evolution of previous. stroke. Dx: no new infarct; previous stroke symptoms relapsed during period of stress. On aspirin 325 mg orally every day prior to admission. Now on clopidogrel 75 mg orally every day for secondary stroke prevention.  Hypertension Lung mass Hx L ACA stroke Aug 2014 Seizures TEE with cardioversion Aug 2014 Cigarette smoker, quit 6 weeks ago  Hospital day # 1  TREATMENT/PLAN  No indication to change antiplatelets during this admission. Recommend to continuation of aspirin 325 mg orally every day for secondary stroke prevention.  No further stroke workup  Ok for discharge from stroke standpoint  Dr. Pearlean Brownie discussed with DR. Buriev.  Annie Main, MSN, RN, ANVP-BC, ANP-BC, [facility] Pager: [phone] 10/25/2012 11:16 AM  I have personally obtained a history, examined the patient, evaluated imaging results, and formulated the assessment and plan of care. I agree  with the above. Delia Heady, MD

## 2012-10-25 NOTE — Progress Notes (Signed)
Utilization review completed.  

## 2012-10-28 ENCOUNTER — Ambulatory Visit (INDEPENDENT_AMBULATORY_CARE_PROVIDER_SITE_OTHER): Payer: 59 | Admitting: Family Medicine

## 2012-10-28 ENCOUNTER — Encounter: Payer: Self-pay | Admitting: Family Medicine

## 2012-10-28 ENCOUNTER — Telehealth: Payer: Self-pay | Admitting: Family Medicine

## 2012-10-28 VITALS — BP 122/80 | HR 78 | Resp 16 | Wt 174.0 lb

## 2012-10-28 DIAGNOSIS — I639 Cerebral infarction, unspecified: Secondary | ICD-10-CM

## 2012-10-28 DIAGNOSIS — R209 Unspecified disturbances of skin sensation: Secondary | ICD-10-CM

## 2012-10-28 DIAGNOSIS — R201 Hypoesthesia of skin: Secondary | ICD-10-CM

## 2012-10-28 DIAGNOSIS — I10 Essential (primary) hypertension: Secondary | ICD-10-CM

## 2012-10-28 DIAGNOSIS — I6350 Cerebral infarction due to unspecified occlusion or stenosis of unspecified cerebral artery: Secondary | ICD-10-CM

## 2012-10-28 DIAGNOSIS — Z23 Encounter for immunization: Secondary | ICD-10-CM

## 2012-10-28 NOTE — Patient Instructions (Signed)
Be aware of your wrist and elbow position when you have the decreased sensation in your hand.

## 2012-10-28 NOTE — Telephone Encounter (Signed)
Advised pt he is not to return to work yet.  Appt made for Thursday.  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

## 2012-10-28 NOTE — Progress Notes (Signed)
  Subjective:    Patient ID: Henry Simmons, male    DOB: [birth year], 42 y.o.   MRN: [id number]  HPI He is here for followup visit. He was recently hospitalized and evaluated for CVA. The hospital record was reviewed. He did have a previous CVA in August. Apparently the latest episode was a residual from this. He was discharged on aspirin. He was to followup here in one or 2 weeks however he is here now mainly complaining of decreased sensation in his left hand. No weakness numbness or tingling is noted.   Review of Systems     Objective:   Physical Exam  Alert and in no distress. Slight weakness noted of the right arm especially with the triceps. He does complain of decreased sensation to the entire a left hand with normal strength. Reflexes normal. Pulses normal.       Assessment & Plan:  Need for prophylactic vaccination and inoculation against influenza - Plan: Flu Vaccine QUAD 36+ mos IM  Tactile hypesthesia  CVA (cerebral vascular accident)  Hypertension  flu shot given with risks and benefits discussed. Explained that the sensation on the left has nothing to do with his CVA.  We checked out he then asked for a note for work. 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 I will arrange for him to be seen by neurology to get cleared to return to work.

## 2012-10-29 DIAGNOSIS — Z23 Encounter for immunization: Secondary | ICD-10-CM

## 2012-10-30 ENCOUNTER — Telehealth: Payer: Self-pay | Admitting: Family Medicine

## 2012-10-31 ENCOUNTER — Encounter: Payer: Self-pay | Admitting: Nurse Practitioner

## 2012-10-31 ENCOUNTER — Institutional Professional Consult (permissible substitution): Payer: 59 | Admitting: Family Medicine

## 2012-10-31 ENCOUNTER — Ambulatory Visit (INDEPENDENT_AMBULATORY_CARE_PROVIDER_SITE_OTHER): Payer: 59 | Admitting: Nurse Practitioner

## 2012-10-31 VITALS — BP 138/91 | HR 82 | Temp 98.7°F | Ht 74.5 in | Wt 175.0 lb

## 2012-10-31 DIAGNOSIS — I10 Essential (primary) hypertension: Secondary | ICD-10-CM

## 2012-10-31 DIAGNOSIS — I639 Cerebral infarction, unspecified: Secondary | ICD-10-CM

## 2012-10-31 DIAGNOSIS — I6350 Cerebral infarction due to unspecified occlusion or stenosis of unspecified cerebral artery: Secondary | ICD-10-CM

## 2012-10-31 DIAGNOSIS — E785 Hyperlipidemia, unspecified: Secondary | ICD-10-CM
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
## 2012-10-31 NOTE — Patient Instructions (Signed)
Continue aspirin 325 mg orally every day  for secondary stroke prevention and maintain strict control of hypertension with blood pressure goal below 130/90, and lipids with LDL cholesterol goal below 100 mg/dL.   Followup in 3 months.  STROKE/TIA INSTRUCTIONS SMOKING Cigarette smoking nearly doubles your risk of having a stroke & is the single most alterable risk factor  If you smoke or have smoked in the last 12 months, you are advised to quit smoking for your health.  Most of the excess cardiovascular risk related to smoking disappears within a year of stopping.  Ask you doctor about anti-smoking medications  [place] Quit Line: 1-800-QUIT NOW  Free Smoking Cessation Classes [phone]  CHOLESTEROL Know your levels; limit fat & cholesterol in your diet  Lab Results  Component Value Date   CHOL 123 10/25/2012   HDL 32* 10/25/2012   LDLCALC 78 10/25/2012   TRIG 63 10/25/2012   CHOLHDL 3.8 10/25/2012      Many patients benefit from treatment even if their cholesterol is at goal.  Goal: Total Cholesterol less than 160  Goal:  LDL less than 100  Goal:  HDL greater than 40  Goal:  Triglycerides less than 150  BLOOD PRESSURE American Stroke Association blood pressure target is less that 120/80 mm/Hg  Your discharge blood pressure is:  BP: 138/91 mmHg  Monitor your blood pressure  Limit your salt and alcohol intake  Many individuals will require more than one medication for high blood pressure  DIABETES (A1c is a blood sugar average for last 3 months) Goal A1c is under 7% (A1c is blood sugar average for last 3 months)  Diabetes: No known diagnosis of diabetes    Lab Results  Component Value Date   HGBA1C 5.2 09/16/2012    Your A1c can be lowered with medications, healthy diet, and exercise.  Check your blood sugar as directed by your physician  Call your physician if you experience unexplained or low blood sugars.  PHYSICAL ACTIVITY/REHABILITATION Goal is 30 minutes at least 4  days per week    Activity decreases your risk of heart attack and stroke and makes your heart stronger.  It helps control your weight and blood pressure; helps you relax and can improve your mood.  Participate in a regular exercise program.  Talk with your doctor about the best form of exercise for you (dancing, walking, swimming, cycling).  DIET/WEIGHT Goal is to maintain a healthy weight  Your height is:  Height: 6' 2.5" (189.2 cm) Your current weight is: Weight: 175 lb (79.379 kg) Your body Mass Index (BMI) is:  BMI (Calculated): 22.2  Following the type of diet specifically designed for you will help prevent another stroke.  Your goal Body Mass Index (BMI) is 19-24.  Healthy food habits can help reduce 3 risk factors for stroke:  High cholesterol, hypertension, and excess weight.

## 2012-11-04 ENCOUNTER — Telehealth: Payer: Self-pay | Admitting: Medical

## 2012-11-04 NOTE — Telephone Encounter (Signed)
lm

## 2012-11-04 NOTE — Telephone Encounter (Signed)
He saw Dr. Vela Prose who I believe is a neurologist.  Did he discuss clearance with Dr. Vela Prose?

## 2012-11-05 NOTE — Telephone Encounter (Signed)
Dr.Lewitt did clear him but he went back to work and pass out and had to go to the hospital and they keep him over night. He had a follow up visit here with Dr. Susann Givens. Dr. Susann Givens said he was okay . He went back to work they said he needs to get another clearance.CLS

## 2012-11-05 NOTE — Telephone Encounter (Signed)
He apparently just saw [facility] last week after seeing Dr. Susann Givens.  Did he not mention this?  He should call them to get work clearance, as they are the specialist and have seen him since the syncope episode

## 2012-11-05 NOTE — Telephone Encounter (Signed)
I explain to the patient that he will need to call over to [facility] and get them to write his clearence letter to return to work. Patient understood and is aware. CLS

## 2012-11-08 ENCOUNTER — Telehealth: Payer: Self-pay | Admitting: Nurse Practitioner

## 2012-11-08 ENCOUNTER — Ambulatory Visit (INDEPENDENT_AMBULATORY_CARE_PROVIDER_SITE_OTHER): Payer: 59 | Admitting: Medical

## 2012-11-08 ENCOUNTER — Encounter: Payer: Self-pay | Admitting: Medical

## 2012-11-08 ENCOUNTER — Telehealth: Payer: Self-pay | Admitting: Family Medicine

## 2012-11-08 VITALS — BP 132/90 | HR 80 | Temp 98.5°F | Resp 16 | Wt 174.0 lb

## 2012-11-08 DIAGNOSIS — I10 Essential (primary) hypertension: Secondary | ICD-10-CM

## 2012-11-08 DIAGNOSIS — R030 Elevated blood-pressure reading, without diagnosis of hypertension: Secondary | ICD-10-CM

## 2012-11-08 DIAGNOSIS — Z8673 Personal history of transient ischemic attack (TIA), and cerebral infarction without residual deficits: Secondary | ICD-10-CM

## 2012-11-08 NOTE — Telephone Encounter (Signed)
I called the patients job and I have also contact [employer] and they are suppose to call me back. The only thing this patient needs is a clearance letter from [facility] in order to return to work. CLS
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
## 2012-11-08 NOTE — Telephone Encounter (Signed)
Message copied by Janeice Robinson on Fri Nov 08, 2012  1:01 PM ------      Message from: Jac Canavan      Created: Fri Nov 08, 2012 10:05 AM       Bond Grieshop - please call [facility].  Let them know that his employer is giving him a hard time and won't let him back to work despite the fact that they have released him back to work.   We need their help in communicating with his employer.                    Contact person at his job is Lanelle Bal, [phone], ------

## 2012-11-11 ENCOUNTER — Ambulatory Visit: Payer: Self-pay | Admitting: Nurse Practitioner

## 2012-11-12 NOTE — Telephone Encounter (Signed)
Letter done and signed by Dr. Pearlean Brownie 11-11-12.  Given to MR to call pt.

## 2012-11-25 ENCOUNTER — Telehealth: Payer: Self-pay | Admitting: Medical

## 2012-11-25 ENCOUNTER — Other Ambulatory Visit: Payer: Self-pay | Admitting: Medical

## 2012-11-25 MED ORDER — AMLODIPINE BESYLATE 10 MG PO TABS: 10.0000 mg | ORAL_TABLET | Freq: Every day | ORAL | Status: DC

## 2012-11-25 MED ORDER — HYDROCHLOROTHIAZIDE 25 MG PO TABS: 25.0000 mg | ORAL_TABLET | Freq: Every day | ORAL | Status: DC

## 2012-11-25 NOTE — Telephone Encounter (Signed)
Refills sent on BP meds.  If not already taking 2 of the 0.5 Klonopin at bedtime, then try 2 tablets QHS.  If already doing this, see what else he has used and what has helped prior.   Make sure he is taking his BP medications in the morning.

## 2012-11-25 NOTE — Telephone Encounter (Signed)
Patient is aware of Shane Tysinger PAC recommendations. CLS 

## 2012-12-09 ENCOUNTER — Ambulatory Visit (INDEPENDENT_AMBULATORY_CARE_PROVIDER_SITE_OTHER): Payer: 59 | Admitting: Medical

## 2012-12-09 ENCOUNTER — Encounter: Payer: Self-pay | Admitting: Medical

## 2012-12-09 VITALS — BP 140/90 | HR 92 | Temp 98.3°F | Resp 18 | Wt 180.0 lb

## 2012-12-09 DIAGNOSIS — N529 Male erectile dysfunction, unspecified: Secondary | ICD-10-CM

## 2012-12-09 DIAGNOSIS — I10 Essential (primary) hypertension: Secondary | ICD-10-CM

## 2012-12-09 MED ORDER — LOSARTAN POTASSIUM 25 MG PO TABS: 25.0000 mg | ORAL_TABLET | Freq: Every day | ORAL | Status: DC

## 2012-12-09 MED ORDER — SILDENAFIL CITRATE 50 MG PO TABS: 50.0000 mg | ORAL_TABLET | Freq: Every day | ORAL | Status: DC | PRN

## 2012-12-09 NOTE — Patient Instructions (Signed)
Begin Losartan 25mg  daily for blood pressure.  Continue Norvasc and HCTZ for now.  Wait at least a week before you begin Viagra samples.  Sildenafil tablets (Viagra) What is this medicine? SILDENAFIL (sil DEN a fil) is used to treat erection problems in men. This medicine may be used for other purposes; ask your health care provider or pharmacist if you have questions. What should I tell my health care provider before I take this medicine? They need to know if you have any of these conditions: -bleeding disorders -eye or vision problems, including a rare inherited eye disease called retinitis pigmentosa -anatomical deformation of the penis, Peyronie's disease, or history of priapism (painful and prolonged erection) -heart disease, angina, a history of heart attack, irregular heart beats, or other heart problems -high or low blood pressure -history of blood diseases, like sickle cell anemia or leukemia -history of stomach bleeding -kidney disease -liver disease -stroke -an unusual or allergic reaction to sildenafil, other medicines, foods, dyes, or preservatives -pregnant or trying to get pregnant -breast-feeding How should I use this medicine? Take this medicine by mouth with a glass of water. Follow the directions on the prescription label. The dose is usually taken 1 hour before sexual activity. You should not take the dose more than once per day. Do not take your medicine more often than directed. Talk to your pediatrician regarding the use of this medicine in children. This medicine is not used in children for this condition. Overdosage: If you think you have taken too much of this medicine contact a poison control center or emergency room at once. NOTE: This medicine is only for you. Do not share this medicine with others. What if I miss a dose? This does not apply. Do not take double or extra doses. What may interact with this medicine? Do not take this medicine with any of the  following medications: -cisapride -methscopolamine nitrate -nitrates like amyl nitrite, isosorbide dinitrate, isosorbide mononitrate, nitroglycerin -nitroprusside -other medicines for erectile dysfunction like avanafil, tadalafil, vardenafil -other sildenafil products (Revatio)  This medicine may also interact with the following medications: -certain drugs for high blood pressure -certain drugs for the treatment of HIV infection or AIDS -certain drugs used for fungal or yeast infections, like fluconazole, itraconazole, ketoconazole, and voriconazole -cimetidine -erythromycin -rifampin This list may not describe all possible interactions. Give your health care provider a list of all the medicines, herbs, non-prescription drugs, or dietary supplements you use. Also tell them if you smoke, drink alcohol, or use illegal drugs. Some items may interact with your medicine. What should I watch for while using this medicine? If you notice any changes in your vision while taking this drug, call your doctor or health care professional as soon as possible. Stop using this medicine and call your health care provider right away if you have a loss of sight in one or both eyes. Contact you doctor or health care professional right away if the erection lasts longer than 4 hours or if it becomes painful. This may be a sign of serious problem and must be treated right away to prevent permanent damage. If you experience symptoms of nausea, dizziness, chest pain or arm pain upon initiation of sexual activity after taking this medicine, you should refrain from further activity and call your doctor or health care professional as soon as possible. Do not drink alcohol to excess (examples, 5 glasses of wine or 5 shots of whiskey) when taking this medicine. When taken in excess, alcohol can  increase your chances of getting a headache or getting dizzy, increasing your heart rate or lowering your blood pressure. Using this  medicine does not protect you or your partner against HIV infection (the virus that causes AIDS) or other sexually transmitted diseases. What side effects may I notice from receiving this medicine? Side effects that you should report to your doctor or health care professional as soon as possible: -allergic reactions like skin rash, itching or hives, swelling of the face, lips, or tongue -breathing problems -changes in hearing -changes in vision -chest pain -erection lasting more than 4 hours -fast, irregular heartbeat -seizures  Side effects that usually do not require medical attention (report to your doctor or health care professional if they continue or are bothersome): -back pain -dizziness -flushing -headache -indigestion -muscle aches -nausea -stuffy or runny nose This list may not describe all possible side effects. Call your doctor for medical advice about side effects. You may report side effects to FDA at 1-800-FDA-1088. Where should I keep my medicine? Keep out of reach of children. Store at room temperature between 15 and 30 degrees C (59 and 86 degrees F). Throw away any unused medicine after the expiration date. NOTE: This sheet is a summary. It may not cover all possible information. If you have questions about this medicine, talk to your doctor, pharmacist, or health care provider.  2012, [employer]. (07/26/2010 1:17:06 PM)
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
## 2012-12-26 ENCOUNTER — Ambulatory Visit: Payer: 59 | Admitting: Neurology

## 2013-01-08 ENCOUNTER — Ambulatory Visit (INDEPENDENT_AMBULATORY_CARE_PROVIDER_SITE_OTHER): Payer: 59 | Admitting: Medical

## 2013-01-08 ENCOUNTER — Encounter: Payer: Self-pay | Admitting: Medical

## 2013-01-08 VITALS — BP 120/82 | HR 80 | Temp 98.1°F | Resp 16 | Wt 188.0 lb

## 2013-01-08 DIAGNOSIS — N529 Male erectile dysfunction, unspecified: Secondary | ICD-10-CM

## 2013-01-08 DIAGNOSIS — I10 Essential (primary) hypertension: Secondary | ICD-10-CM

## 2013-01-08 NOTE — Progress Notes (Signed)
  Subjective:     Henry Simmons is a 42 y.o. male here for recheck.    Since last visit we added Losartan 25mg .  He is getting normal BPs now, feels fine, improved.  Compliant with all medications.    Last visit we added Viagra 50mg .  He has seen little improvement.   Onset of erectile dysfunction was several weeks ago and onset was gradual. Patient states he has difficulty maintaining erection. Full erections occur never.  Although he can still get erections initially with intercourse, can't keep them.   However, he doesn't get morning erections, and can't recall last morning erections.  Partial erections occur with intercourse. Libido is not affected. Risk factors for ED include BP medication and cerebrovascular disease.   Patient is not a smoker, quit since stroke diagnosis this year.  Patient denies history of diabetes mellitus, hypogonadism and pelvic radiation.  Patient describes relationship with partner as good, has new partner, using condoms. Previous treatment of ED includes none.    Reviewed their medical, surgical, family, social, medication, and allergy history and updated chart as appropriate.    Objective:   Objective: Filed Vitals:   01/08/13 1550  BP: 120/82  Pulse: 80  Temp: 98.1 F (36.7 C)  Resp: 16    General appearance: alert, no distress, WD/WN,AA male Heart: RRR, normal S1, S2, no murmurs Lungs: CTA bilaterally, no wheezes, rhonchi, or rales Pulses normal   Assessment:    Encounter Diagnoses  Name Primary?  . Essential hypertension, benign Yes  . Erectile dysfunction     Plan:   HTN - c/t Norvasc 10mg , C/t HCTZ 25mg , Losartan 25mg  daily. Controlled on current medication ED - not much improvement with Viagra 50mg .  Increase to 100mg  Viagra.  Also gave samples of Levitra 20mg  to try.  discussed proper use, risks, benefits, call report in 1-2 wk.

## 2013-02-24 ENCOUNTER — Encounter: Payer: Self-pay | Admitting: Nurse Practitioner

## 2013-02-24 ENCOUNTER — Telehealth: Payer: Self-pay | Admitting: Nurse Practitioner

## 2013-02-24 NOTE — Telephone Encounter (Signed)
[facility] appt changed from 3/24 to 3/26. Also mailed letter

## 2013-03-11 ENCOUNTER — Ambulatory Visit: Payer: Self-pay | Admitting: Nurse Practitioner

## 2013-05-06 ENCOUNTER — Ambulatory Visit: Payer: 59 | Admitting: Nurse Practitioner

## 2013-05-08 ENCOUNTER — Ambulatory Visit: Payer: 59 | Admitting: Nurse Practitioner

## 2019-05-15 ENCOUNTER — Ambulatory Visit: Payer: 59 | Attending: Internal Medicine

## 2019-05-15 DIAGNOSIS — Z23 Encounter for immunization: Secondary | ICD-10-CM

## 2019-05-15 NOTE — Progress Notes (Signed)
   Covid-19 Vaccination Clinic  Name:  Henry Simmons    MRN: [id number] DOB: [birth year]  05/15/2019  [name] was observed post Covid-19 immunization for 15 minutes without incident. He was provided with Vaccine Information Sheet and instruction to access the V-Safe system.   [name] was instructed to call 911 with any severe reactions post vaccine: [name] Difficulty breathing  . Swelling of face and throat  . A fast heartbeat  . A bad rash all over body  . Dizziness and weakness   Immunizations Administered    Name Date Dose VIS Date Route   Pfizer COVID-19 Vaccine 05/15/2019  3:45 PM 0.3 mL 01/24/2019 Intramuscular   Manufacturer: [employer], [employer]   Lot: [id number]   NDC: [address]-9

## 2019-06-09 ENCOUNTER — Ambulatory Visit: Payer: 59 | Attending: Internal Medicine

## 2019-06-09 DIAGNOSIS — Z23 Encounter for immunization: Secondary | ICD-10-CM

## 2019-06-09 NOTE — Progress Notes (Signed)
   Covid-19 Vaccination Clinic  Name:  STARLING CHRISTOFFERSON    MRN: [id number] DOB: 08/01/1970  06/09/2019  Mr. Marney was observed post Covid-19 immunization for 15 minutes without incident. He was provided with Vaccine Information Sheet and instruction to access the V-Safe system.   Mr. Mau was instructed to call 911 with any severe reactions post vaccine: [name] Difficulty breathing  . Swelling of face and throat  . A fast heartbeat  . A bad rash all over body  . Dizziness and weakness   Immunizations Administered    Name Date Dose VIS Date Route   Pfizer COVID-19 Vaccine 06/09/2019 11:28 AM 0.3 mL 04/09/2018 Intramuscular   Manufacturer: [employer], [employer]   Lot: [id number]   NDC: [address]-0

## 2020-11-29 ENCOUNTER — Emergency Department (HOSPITAL_COMMUNITY): Payer: 59

## 2020-11-29 ENCOUNTER — Other Ambulatory Visit: Payer: Self-pay

## 2020-11-29 ENCOUNTER — Inpatient Hospital Stay (HOSPITAL_COMMUNITY)
Admission: EM | Admit: 2020-11-29 | Discharge: 2020-12-01 | DRG: 247 | Disposition: A | Discharge status: Home / Self Care | Payer: 59 | Source: Ambulatory Visit | Attending: Interventional Cardiology | Admitting: Interventional Cardiology

## 2020-11-29 ENCOUNTER — Encounter (HOSPITAL_COMMUNITY): Payer: Self-pay | Admitting: Emergency Medicine

## 2020-11-29 DIAGNOSIS — Z20822 Contact with and (suspected) exposure to covid-19: Secondary | ICD-10-CM | POA: Diagnosis present

## 2020-11-29 DIAGNOSIS — I214 Non-ST elevation (NSTEMI) myocardial infarction: Secondary | ICD-10-CM | POA: Diagnosis present

## 2020-11-29 DIAGNOSIS — N183 Chronic kidney disease, stage 3 unspecified: Secondary | ICD-10-CM | POA: Diagnosis present

## 2020-11-29 DIAGNOSIS — I160 Hypertensive urgency: Secondary | ICD-10-CM | POA: Diagnosis present

## 2020-11-29 DIAGNOSIS — I200 Unstable angina: Secondary | ICD-10-CM | POA: Diagnosis present

## 2020-11-29 DIAGNOSIS — N179 Acute kidney failure, unspecified: Secondary | ICD-10-CM | POA: Diagnosis present

## 2020-11-29 DIAGNOSIS — Z23 Encounter for immunization: Secondary | ICD-10-CM

## 2020-11-29 DIAGNOSIS — I130 Hypertensive heart and chronic kidney disease with heart failure and stage 1 through stage 4 chronic kidney disease, or unspecified chronic kidney disease: Secondary | ICD-10-CM | POA: Diagnosis present

## 2020-11-29 DIAGNOSIS — E785 Hyperlipidemia, unspecified: Secondary | ICD-10-CM | POA: Diagnosis present

## 2020-11-29 DIAGNOSIS — I5022 Chronic systolic (congestive) heart failure: Secondary | ICD-10-CM | POA: Diagnosis present

## 2020-11-29 DIAGNOSIS — I10 Essential (primary) hypertension: Secondary | ICD-10-CM | POA: Diagnosis present

## 2020-11-29 DIAGNOSIS — Z720 Tobacco use: Secondary | ICD-10-CM | POA: Diagnosis not present

## 2020-11-29 DIAGNOSIS — I25110 Atherosclerotic heart disease of native coronary artery with unstable angina pectoris: Secondary | ICD-10-CM | POA: Diagnosis present

## 2020-11-29 DIAGNOSIS — Z8673 Personal history of transient ischemic attack (TIA), and cerebral infarction without residual deficits: Secondary | ICD-10-CM

## 2020-11-29 DIAGNOSIS — I2510 Atherosclerotic heart disease of native coronary artery without angina pectoris: Secondary | ICD-10-CM | POA: Diagnosis not present

## 2020-11-29 DIAGNOSIS — Z833 Family history of diabetes mellitus: Secondary | ICD-10-CM | POA: Diagnosis not present

## 2020-11-29 DIAGNOSIS — Z8249 Family history of ischemic heart disease and other diseases of the circulatory system: Secondary | ICD-10-CM | POA: Diagnosis not present

## 2020-11-29 DIAGNOSIS — Z955 Presence of coronary angioplasty implant and graft: Secondary | ICD-10-CM

## 2020-11-29 DIAGNOSIS — I255 Ischemic cardiomyopathy: Secondary | ICD-10-CM | POA: Diagnosis present

## 2020-11-29 LAB — BASIC METABOLIC PANEL
Anion gap: 10 (ref 5–15)
BUN: 21 mg/dL — ABNORMAL HIGH (ref 6–20)
CO2: 26 mmol/L (ref 22–32)
Calcium: 9.8 mg/dL (ref 8.9–10.3)
Chloride: 99 mmol/L (ref 98–111)
Creatinine, Ser: 1.99 mg/dL — ABNORMAL HIGH (ref 0.61–1.24)
GFR, Estimated: 40 mL/min — ABNORMAL LOW (ref >60)
Glucose, Bld: 131 mg/dL — ABNORMAL HIGH (ref 70–99)
Potassium: 4.2 mmol/L (ref 3.5–5.1)
Sodium: 135 mmol/L (ref 135–145)

## 2020-11-29 LAB — I-STAT CHEM 8, ED
BUN: 25 mg/dL — ABNORMAL HIGH (ref 6–20)
Calcium, Ion: 1.07 mmol/L — ABNORMAL LOW (ref 1.15–1.40)
Chloride: 101 mmol/L (ref 98–111)
Creatinine, Ser: 1.8 mg/dL — ABNORMAL HIGH (ref 0.61–1.24)
Glucose, Bld: 129 mg/dL — ABNORMAL HIGH (ref 70–99)
HCT: 54 % — ABNORMAL HIGH (ref 39.0–52.0)
Hemoglobin: 18.4 g/dL — ABNORMAL HIGH (ref 13.0–17.0)
Potassium: 4.2 mmol/L (ref 3.5–5.1)
Sodium: 136 mmol/L (ref 135–145)
TCO2: 26 mmol/L (ref 22–32)

## 2020-11-29 LAB — CBC
HCT: 51.3 % (ref 39.0–52.0)
Hemoglobin: 17 g/dL (ref 13.0–17.0)
MCH: 28.4 pg (ref 26.0–34.0)
MCHC: 33.1 g/dL (ref 30.0–36.0)
MCV: 85.6 fL (ref 80.0–100.0)
Platelets: 148 10*3/uL — ABNORMAL LOW (ref 150–400)
RBC: 5.99 MIL/uL — ABNORMAL HIGH (ref 4.22–5.81)
RDW: 14.5 % (ref 11.5–15.5)
WBC: 10.7 10*3/uL — ABNORMAL HIGH (ref 4.0–10.5)
nRBC: 0 % (ref 0.0–0.2)

## 2020-11-29 LAB — HEPATIC FUNCTION PANEL
ALT: 61 U/L — ABNORMAL HIGH (ref 0–44)
AST: 308 U/L — ABNORMAL HIGH (ref 15–41)
Albumin: 4.2 g/dL (ref 3.5–5.0)
Alkaline Phosphatase: 61 U/L (ref 38–126)
Bilirubin, Direct: 0.2 mg/dL (ref 0.0–0.2)
Indirect Bilirubin: 1.1 mg/dL — ABNORMAL HIGH (ref 0.3–0.9)
Total Bilirubin: 1.3 mg/dL — ABNORMAL HIGH (ref 0.3–1.2)
Total Protein: 7.7 g/dL (ref 6.5–8.1)

## 2020-11-29 LAB — ECHOCARDIOGRAM COMPLETE
AR max vel: 2.26 cm2
AV Area VTI: 2.44 cm2
AV Area mean vel: 2.19 cm2
AV Mean grad: 4 mmHg
AV Peak grad: 7.8 mmHg
Ao pk vel: 1.4 m/s
Calc EF: 30.6 %
Height: 74 in
S' Lateral: 3.4 cm
Single Plane A2C EF: 24.9 %
Single Plane A4C EF: 28.3 %
Weight: 2944 oz

## 2020-11-29 LAB — RESP PANEL BY RT-PCR (FLU A&B, COVID) ARPGX2
Influenza A by PCR: NEGATIVE
Influenza B by PCR: NEGATIVE
SARS Coronavirus 2 by RT PCR: NEGATIVE

## 2020-11-29 LAB — TROPONIN I (HIGH SENSITIVITY)
Troponin I (High Sensitivity): >24000 ng/L (ref <18)
Troponin I (High Sensitivity): >24000 ng/L (ref <18)

## 2020-11-29 LAB — RAPID URINE DRUG SCREEN, HOSP PERFORMED
Amphetamines: NOT DETECTED
Barbiturates: NOT DETECTED
Benzodiazepines: NOT DETECTED
Cocaine: NOT DETECTED
Opiates: NOT DETECTED
Tetrahydrocannabinol: NOT DETECTED

## 2020-11-29 LAB — HEPARIN LEVEL (UNFRACTIONATED): Heparin Unfractionated: 0.14 IU/mL — ABNORMAL LOW (ref 0.30–0.70)

## 2020-11-29 LAB — LIPASE, BLOOD: Lipase: 43 U/L (ref 11–51)

## 2020-11-29 LAB — HIV ANTIBODY (ROUTINE TESTING W REFLEX): HIV Screen 4th Generation wRfx: NONREACTIVE

## 2020-11-29 MED ORDER — ASPIRIN 300 MG RE SUPP: 300.0000 mg | RECTAL | Status: AC

## 2020-11-29 MED ORDER — ACETAMINOPHEN 325 MG PO TABS: 650.0000 mg | ORAL_TABLET | ORAL | Status: DC | PRN

## 2020-11-29 MED ORDER — ATORVASTATIN CALCIUM 80 MG PO TABS
80.0000 mg | ORAL_TABLET | Freq: Every day | ORAL | Status: DC
  Administered 2020-11-29 – 2020-12-01 (×3): 80 mg via ORAL
  Filled 2020-11-29: qty 1
  Filled 2020-11-29: qty 2
  Filled 2020-11-29: qty 1

## 2020-11-29 MED ORDER — AMLODIPINE BESYLATE 10 MG PO TABS
10.0000 mg | ORAL_TABLET | Freq: Every day | ORAL | Status: DC
  Administered 2020-11-30 – 2020-12-01 (×2): 10 mg via ORAL
  Filled 2020-11-29 (×2): qty 1

## 2020-11-29 MED ORDER — NITROGLYCERIN IN D5W 200-5 MCG/ML-% IV SOLN
0.0000 ug/min | INTRAVENOUS | Status: DC
  Administered 2020-11-29: 5 ug/min via INTRAVENOUS
  Filled 2020-11-29: qty 250

## 2020-11-29 MED ORDER — CARVEDILOL 6.25 MG PO TABS
6.2500 mg | ORAL_TABLET | Freq: Two times a day (BID) | ORAL | Status: DC
  Administered 2020-11-30: 6.25 mg via ORAL
  Filled 2020-11-29: qty 1

## 2020-11-29 MED ORDER — NITROGLYCERIN IN D5W 200-5 MCG/ML-% IV SOLN
0.0000 ug/min | INTRAVENOUS | Status: DC
  Administered 2020-11-29: 30 ug/min via INTRAVENOUS

## 2020-11-29 MED ORDER — HEPARIN (PORCINE) 25000 UT/250ML-% IV SOLN
1450.0000 [IU]/h | INTRAVENOUS | Status: DC
  Administered 2020-11-29: 1000 [IU]/h via INTRAVENOUS
  Administered 2020-11-30: 1450 [IU]/h via INTRAVENOUS
  Filled 2020-11-29 (×2): qty 250

## 2020-11-29 MED ORDER — HYDRALAZINE HCL 25 MG PO TABS
25.0000 mg | ORAL_TABLET | Freq: Three times a day (TID) | ORAL | Status: DC
  Administered 2020-11-29 – 2020-12-01 (×6): 25 mg via ORAL
  Filled 2020-11-29 (×6): qty 1

## 2020-11-29 MED ORDER — SODIUM CHLORIDE 0.9 % IV BOLUS
1000.0000 mL | Freq: Once | INTRAVENOUS | Status: AC
  Administered 2020-11-29: 1000 mL via INTRAVENOUS

## 2020-11-29 MED ORDER — SODIUM CHLORIDE 0.9 % IV SOLN: INTRAVENOUS | Status: AC

## 2020-11-29 MED ORDER — SODIUM CHLORIDE 0.9 % IV SOLN: INTRAVENOUS | Status: DC

## 2020-11-29 MED ORDER — ASPIRIN 81 MG PO CHEW
324.0000 mg | CHEWABLE_TABLET | ORAL | Status: AC
  Administered 2020-11-29: 324 mg via ORAL
  Filled 2020-11-29: qty 4

## 2020-11-29 MED ORDER — HEPARIN BOLUS VIA INFUSION
4000.0000 [IU] | Freq: Once | INTRAVENOUS | Status: AC
  Administered 2020-11-29: 4000 [IU] via INTRAVENOUS
  Filled 2020-11-29: qty 4000

## 2020-11-29 MED ORDER — ASPIRIN EC 81 MG PO TBEC: 81.0000 mg | DELAYED_RELEASE_TABLET | Freq: Every day | ORAL | Status: DC

## 2020-11-29 MED ORDER — IOHEXOL 350 MG/ML SOLN
100.0000 mL | Freq: Once | INTRAVENOUS | Status: AC | PRN
  Administered 2020-11-29: 100 mL via INTRAVENOUS

## 2020-11-29 MED ORDER — AMLODIPINE BESYLATE 5 MG PO TABS
5.0000 mg | ORAL_TABLET | Freq: Every day | ORAL | Status: DC
  Administered 2020-11-29: 5 mg via ORAL
  Filled 2020-11-29: qty 1

## 2020-11-29 MED ORDER — ONDANSETRON HCL 4 MG/2ML IJ SOLN: 4.0000 mg | Freq: Four times a day (QID) | INTRAMUSCULAR | Status: DC | PRN

## 2020-11-29 MED ORDER — AMLODIPINE BESYLATE 5 MG PO TABS
5.0000 mg | ORAL_TABLET | Freq: Once | ORAL | Status: AC
  Administered 2020-11-29: 5 mg via ORAL
  Filled 2020-11-29: qty 1
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
## 2020-11-29 NOTE — ED Triage Notes (Signed)
Pt BIB [facility] from PCP office, was at work this AM, became diaphoretic and had a near syncopal episode. Pt had syncopal episode Saturday. Initial BP 240/140 w/central CP, nonradiating. Given 1 SL NTG, BP then 210/130. Denies CP at this time. Given 324 ASA. 20 g R AC. 12 lead showed LVH and some T wave inversion. CBG 160.

## 2020-11-29 NOTE — ED Notes (Signed)
Critical troponin given to [place], [place]

## 2020-11-29 NOTE — Progress Notes (Signed)
  Echocardiogram 2D Echocardiogram has been performed.  Henry Simmons F 11/29/2020, 3:00 PM
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
## 2020-11-29 NOTE — ED Notes (Signed)
Patient transported to CT 
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
## 2020-11-30 ENCOUNTER — Encounter (HOSPITAL_COMMUNITY): Payer: Self-pay | Admitting: Interventional Cardiology

## 2020-11-30 ENCOUNTER — Other Ambulatory Visit (HOSPITAL_COMMUNITY): Payer: Self-pay

## 2020-11-30 ENCOUNTER — Encounter (HOSPITAL_COMMUNITY)
Admission: EM | Disposition: A | Discharge status: Home / Self Care | Payer: Self-pay | Source: Ambulatory Visit | Attending: Interventional Cardiology

## 2020-11-30 DIAGNOSIS — I2510 Atherosclerotic heart disease of native coronary artery without angina pectoris: Secondary | ICD-10-CM

## 2020-11-30 HISTORY — PX: INTRAVASCULAR ULTRASOUND/IVUS: CATH118244

## 2020-11-30 HISTORY — PX: LEFT HEART CATH AND CORONARY ANGIOGRAPHY: CATH118249

## 2020-11-30 HISTORY — PX: CORONARY STENT INTERVENTION: CATH118234

## 2020-11-30 LAB — CBC
HCT: 41.4 % (ref 39.0–52.0)
Hemoglobin: 13.7 g/dL (ref 13.0–17.0)
MCH: 28.4 pg (ref 26.0–34.0)
MCHC: 33.1 g/dL (ref 30.0–36.0)
MCV: 85.7 fL (ref 80.0–100.0)
Platelets: 150 10*3/uL (ref 150–400)
RBC: 4.83 MIL/uL (ref 4.22–5.81)
RDW: 13.8 % (ref 11.5–15.5)
WBC: 9.8 10*3/uL (ref 4.0–10.5)
nRBC: 0 % (ref 0.0–0.2)

## 2020-11-30 LAB — BASIC METABOLIC PANEL
Anion gap: 7 (ref 5–15)
BUN: 22 mg/dL — ABNORMAL HIGH (ref 6–20)
CO2: 23 mmol/L (ref 22–32)
Calcium: 8.6 mg/dL — ABNORMAL LOW (ref 8.9–10.3)
Chloride: 104 mmol/L (ref 98–111)
Creatinine, Ser: 1.56 mg/dL — ABNORMAL HIGH (ref 0.61–1.24)
GFR, Estimated: 53 mL/min — ABNORMAL LOW (ref >60)
Glucose, Bld: 119 mg/dL — ABNORMAL HIGH (ref 70–99)
Potassium: 3.9 mmol/L (ref 3.5–5.1)
Sodium: 134 mmol/L — ABNORMAL LOW (ref 135–145)

## 2020-11-30 LAB — MAGNESIUM: Magnesium: 2.5 mg/dL — ABNORMAL HIGH (ref 1.7–2.4)

## 2020-11-30 LAB — LIPID PANEL
Cholesterol: 157 mg/dL (ref 0–200)
HDL: 35 mg/dL — ABNORMAL LOW (ref >40)
LDL Cholesterol: 108 mg/dL — ABNORMAL HIGH (ref 0–99)
Total CHOL/HDL Ratio: 4.5 RATIO
Triglycerides: 70 mg/dL (ref <150)
VLDL: 14 mg/dL (ref 0–40)

## 2020-11-30 LAB — HEMOGLOBIN A1C
Hgb A1c MFr Bld: 5.3 % (ref 4.8–5.6)
Mean Plasma Glucose: 105.41 mg/dL

## 2020-11-30 LAB — TSH: TSH: 1.016 u[IU]/mL (ref 0.350–4.500)

## 2020-11-30 LAB — POCT ACTIVATED CLOTTING TIME: Activated Clotting Time: 387 seconds

## 2020-11-30 LAB — HEPARIN LEVEL (UNFRACTIONATED): Heparin Unfractionated: 0.24 IU/mL — ABNORMAL LOW (ref 0.30–0.70)

## 2020-11-30 SURGERY — LEFT HEART CATH AND CORONARY ANGIOGRAPHY
Anesthesia: LOCAL

## 2020-11-30 MED ORDER — SODIUM CHLORIDE 0.9% FLUSH
3.0000 mL | Freq: Two times a day (BID) | INTRAVENOUS | Status: DC
  Administered 2020-11-30 – 2020-12-01 (×2): 3 mL via INTRAVENOUS

## 2020-11-30 MED ORDER — SODIUM CHLORIDE 0.9% FLUSH: 3.0000 mL | INTRAVENOUS | Status: DC | PRN

## 2020-11-30 MED ORDER — CARVEDILOL 12.5 MG PO TABS
12.5000 mg | ORAL_TABLET | Freq: Two times a day (BID) | ORAL | Status: DC
  Administered 2020-11-30 – 2020-12-01 (×3): 12.5 mg via ORAL
  Filled 2020-11-30 (×3): qty 1

## 2020-11-30 MED ORDER — SODIUM CHLORIDE 0.9 % WEIGHT BASED INFUSION
3.0000 mL/kg/h | INTRAVENOUS | Status: DC
  Administered 2020-11-30: 3 mL/kg/h via INTRAVENOUS

## 2020-11-30 MED ORDER — ENOXAPARIN SODIUM 40 MG/0.4ML IJ SOSY
40.0000 mg | PREFILLED_SYRINGE | INTRAMUSCULAR | Status: DC
  Administered 2020-12-01: 40 mg via SUBCUTANEOUS
  Filled 2020-11-30: qty 0.4

## 2020-11-30 MED ORDER — ASPIRIN EC 81 MG PO TBEC
81.0000 mg | DELAYED_RELEASE_TABLET | Freq: Every day | ORAL | Status: DC
  Administered 2020-12-01: 81 mg via ORAL
  Filled 2020-11-30: qty 1

## 2020-11-30 MED ORDER — SODIUM CHLORIDE 0.9 % WEIGHT BASED INFUSION
1.0000 mL/kg/h | INTRAVENOUS | Status: AC
  Administered 2020-11-30 (×2): 1 mL/kg/h via INTRAVENOUS

## 2020-11-30 MED ORDER — ASPIRIN 81 MG PO CHEW
81.0000 mg | CHEWABLE_TABLET | ORAL | Status: AC
  Administered 2020-11-30: 81 mg via ORAL
  Filled 2020-11-30: qty 1

## 2020-11-30 MED ORDER — FENTANYL CITRATE (PF) 100 MCG/2ML IJ SOLN
INTRAMUSCULAR | Status: AC
  Filled 2020-11-30: qty 2

## 2020-11-30 MED ORDER — VERAPAMIL HCL 2.5 MG/ML IV SOLN
INTRAVENOUS | Status: DC | PRN
  Administered 2020-11-30: 10 mL via INTRA_ARTERIAL

## 2020-11-30 MED ORDER — NITROGLYCERIN 1 MG/10 ML FOR IR/CATH LAB
INTRA_ARTERIAL | Status: DC | PRN
  Administered 2020-11-30 (×2): 200 ug via INTRACORONARY

## 2020-11-30 MED ORDER — HEPARIN (PORCINE) IN NACL 1000-0.9 UT/500ML-% IV SOLN
INTRAVENOUS | Status: DC | PRN
  Administered 2020-11-30 (×2): 500 mL

## 2020-11-30 MED ORDER — MIDAZOLAM HCL 2 MG/2ML IJ SOLN
INTRAMUSCULAR | Status: AC
  Filled 2020-11-30: qty 2

## 2020-11-30 MED ORDER — NITROGLYCERIN 1 MG/10 ML FOR IR/CATH LAB
INTRA_ARTERIAL | Status: AC
  Filled 2020-11-30: qty 10

## 2020-11-30 MED ORDER — SODIUM CHLORIDE 0.9 % IV SOLN: 250.0000 mL | INTRAVENOUS | Status: DC | PRN

## 2020-11-30 MED ORDER — MIDAZOLAM HCL 2 MG/2ML IJ SOLN
INTRAMUSCULAR | Status: DC | PRN
  Administered 2020-11-30: 2 mg via INTRAVENOUS

## 2020-11-30 MED ORDER — LABETALOL HCL 5 MG/ML IV SOLN: 10.0000 mg | INTRAVENOUS | Status: AC | PRN

## 2020-11-30 MED ORDER — LIDOCAINE HCL (PF) 1 % IJ SOLN
INTRAMUSCULAR | Status: AC
  Filled 2020-11-30: qty 30

## 2020-11-30 MED ORDER — SODIUM CHLORIDE 0.9 % WEIGHT BASED INFUSION: 1.0000 mL/kg/h | INTRAVENOUS | Status: DC

## 2020-11-30 MED ORDER — FENTANYL CITRATE (PF) 100 MCG/2ML IJ SOLN
INTRAMUSCULAR | Status: DC | PRN
  Administered 2020-11-30: 25 ug via INTRAVENOUS

## 2020-11-30 MED ORDER — IOHEXOL 350 MG/ML SOLN
INTRAVENOUS | Status: DC | PRN
  Administered 2020-11-30: 140 mL

## 2020-11-30 MED ORDER — SODIUM CHLORIDE 0.9% FLUSH: 3.0000 mL | Freq: Two times a day (BID) | INTRAVENOUS | Status: DC

## 2020-11-30 MED ORDER — HEPARIN (PORCINE) IN NACL 1000-0.9 UT/500ML-% IV SOLN
INTRAVENOUS | Status: AC
  Filled 2020-11-30: qty 1000

## 2020-11-30 MED ORDER — TICAGRELOR 90 MG PO TABS
ORAL_TABLET | ORAL | Status: AC
  Filled 2020-11-30: qty 2

## 2020-11-30 MED ORDER — CARVEDILOL 6.25 MG PO TABS: 6.2500 mg | ORAL_TABLET | Freq: Once | ORAL | Status: DC

## 2020-11-30 MED ORDER — TICAGRELOR 90 MG PO TABS
ORAL_TABLET | ORAL | Status: DC | PRN
  Administered 2020-11-30: 180 mg via ORAL

## 2020-11-30 MED ORDER — HYDRALAZINE HCL 20 MG/ML IJ SOLN: 10.0000 mg | INTRAMUSCULAR | Status: AC | PRN

## 2020-11-30 MED ORDER — VERAPAMIL HCL 2.5 MG/ML IV SOLN
INTRAVENOUS | Status: AC
  Filled 2020-11-30: qty 2

## 2020-11-30 MED ORDER — HEPARIN SODIUM (PORCINE) 1000 UNIT/ML IJ SOLN
INTRAMUSCULAR | Status: DC | PRN
  Administered 2020-11-30 (×2): 4500 [IU] via INTRAVENOUS

## 2020-11-30 MED ORDER — LIDOCAINE HCL (PF) 1 % IJ SOLN
INTRAMUSCULAR | Status: DC | PRN
  Administered 2020-11-30: 2 mL

## 2020-11-30 MED ORDER — TICAGRELOR 90 MG PO TABS
90.0000 mg | ORAL_TABLET | Freq: Two times a day (BID) | ORAL | Status: DC
  Administered 2020-11-30 – 2020-12-01 (×2): 90 mg via ORAL
  Filled 2020-11-30 (×2): qty 1

## 2020-11-30 SURGICAL SUPPLY — 20 items
BALLN EUPHORA RX 2.5X15 (BALLOONS) ×2
BALLN SAPPHIRE ~~LOC~~ 3.0X18 (BALLOONS) ×2 IMPLANT
BALLN ~~LOC~~ EUPHORA RX 3.5X8 (BALLOONS) ×2
BALLOON EUPHORA RX 2.5X15 (BALLOONS) ×1 IMPLANT
BALLOON ~~LOC~~ EUPHORA RX 3.5X8 (BALLOONS) ×1 IMPLANT
CATH 5FR JL3.5 JR4 ANG PIG MP (CATHETERS) ×2 IMPLANT
CATH OPTICROSS HD (CATHETERS) ×2 IMPLANT
CATH VISTA GUIDE 6FR XBLAD3.5 (CATHETERS) ×2 IMPLANT
DEVICE RAD COMP TR BAND LRG (VASCULAR PRODUCTS) ×2 IMPLANT
GLIDESHEATH SLEND SS 6F .021 (SHEATH) ×2 IMPLANT
GUIDEWIRE INQWIRE 1.5J.035X260 (WIRE) ×1 IMPLANT
INQWIRE 1.5J .035X260CM (WIRE) ×2
KIT ENCORE 26 ADVANTAGE (KITS) ×2 IMPLANT
KIT HEART LEFT (KITS) ×2 IMPLANT
PACK CARDIAC CATHETERIZATION (CUSTOM PROCEDURE TRAY) ×2 IMPLANT
SLED PULL BACK IVUS (MISCELLANEOUS) ×2 IMPLANT
STENT ONYX FRONTIER 2.75X38 (Permanent Stent) ×2 IMPLANT
TRANSDUCER W/STOPCOCK (MISCELLANEOUS) ×2 IMPLANT
TUBING CIL FLEX 10 FLL-RA (TUBING) ×2 IMPLANT
WIRE ASAHI PROWATER 180CM (WIRE) ×4 IMPLANT

## 2020-11-30 NOTE — Progress Notes (Signed)
During time between receiving pt from cath lab to the time air was starting to be released the cath site maintained clean, dry, intact, with no hematoma. Pt had no complaints during process.

## 2020-11-30 NOTE — TOC Benefit Eligibility Note (Signed)
Patient Product/process development scientist completed.    The patient is currently admitted and upon discharge could be taking Brilinta 90 mg.  The current 30 day co-pay is, $282.42 due to a $2,632.40 deductible.   The patient is currently admitted and upon discharge could be taking Jardiance 10 mg.  The current 30 day co-pay is, $196.13 due to a $2,632.40 deductible.   The patient is currently admitted and upon discharge could be taking Entresto 24-26 mg.  Requires Prior Authorization  The patient is currently admitted and upon discharge could be taking Farxiga 10 mg.  Non Formulary   The patient is insured through [employer]     Roland Earl, CPhT Pharmacy Patient Advocate Specialist [place] Antimicrobial Stewardship Team Direct Number: [phone]  Fax: [phone]
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
## 2020-11-30 NOTE — Progress Notes (Signed)
Cardiology,  Rhonada, was contacted during shift and advised that pt had a 5 beat run of v tach but was asymptomatic.

## 2020-11-30 NOTE — Progress Notes (Signed)
Spoke to Dr. Abe People at bedside of patient. Discussed pt's elevated BP advised to dose of carvedilol 12.5mg  now and changed the scheduled dose to 12.5 mg twice daily.

## 2020-11-30 NOTE — Interval H&P Note (Signed)
History and Physical Interval Note:  11/30/2020 8:30 AM  Henry Simmons  has presented today for surgery, with the diagnosis of chest pain.  The various methods of treatment have been discussed with the patient and family. After consideration of risks, benefits and other options for treatment, the patient has consented to  Procedure(s): LEFT HEART CATH AND CORONARY ANGIOGRAPHY (N/A) as a surgical intervention.  The patient's history has been reviewed, patient examined, no change in status, stable for surgery.  I have reviewed the patient's chart and labs.  Questions were answered to the patient's satisfaction.    Cath Lab Visit (complete for each Cath Lab visit)  Clinical Evaluation Leading to the Procedure:   ACS: Yes.    Non-ACS:    Anginal Classification: CCS IV  Anti-ischemic medical therapy: Minimal Therapy (1 class of medications)  Non-Invasive Test Results: No non-invasive testing performed  Prior CABG: No previous CABG       Theron Arista [facility] 11/30/2020 8:31 AM

## 2020-11-30 NOTE — Plan of Care (Signed)
  Problem: Education: Goal: Knowledge of General Education information will improve Description: Including pain rating scale, medication(s)/side effects and non-pharmacologic comfort measures Outcome: Completed/Met

## 2020-12-01 ENCOUNTER — Other Ambulatory Visit (HOSPITAL_COMMUNITY): Payer: Self-pay

## 2020-12-01 ENCOUNTER — Telehealth (HOSPITAL_COMMUNITY): Payer: Self-pay | Admitting: Pharmacist

## 2020-12-01 DIAGNOSIS — I255 Ischemic cardiomyopathy: Secondary | ICD-10-CM

## 2020-12-01 DIAGNOSIS — E785 Hyperlipidemia, unspecified: Secondary | ICD-10-CM

## 2020-12-01 LAB — BASIC METABOLIC PANEL
Anion gap: 5 (ref 5–15)
BUN: 15 mg/dL (ref 6–20)
CO2: 25 mmol/L (ref 22–32)
Calcium: 8.5 mg/dL — ABNORMAL LOW (ref 8.9–10.3)
Chloride: 100 mmol/L (ref 98–111)
Creatinine, Ser: 1.31 mg/dL — ABNORMAL HIGH (ref 0.61–1.24)
GFR, Estimated: >60 mL/min (ref >60)
Glucose, Bld: 127 mg/dL — ABNORMAL HIGH (ref 70–99)
Potassium: 4.3 mmol/L (ref 3.5–5.1)
Sodium: 130 mmol/L — ABNORMAL LOW (ref 135–145)

## 2020-12-01 LAB — CBC
HCT: 39 % (ref 39.0–52.0)
Hemoglobin: 12.9 g/dL — ABNORMAL LOW (ref 13.0–17.0)
MCH: 28.6 pg (ref 26.0–34.0)
MCHC: 33.1 g/dL (ref 30.0–36.0)
MCV: 86.5 fL (ref 80.0–100.0)
Platelets: 121 10*3/uL — ABNORMAL LOW (ref 150–400)
RBC: 4.51 MIL/uL (ref 4.22–5.81)
RDW: 14 % (ref 11.5–15.5)
WBC: 9.9 10*3/uL (ref 4.0–10.5)
nRBC: 0 % (ref 0.0–0.2)

## 2020-12-01 MED ORDER — ASPIRIN 81 MG PO TBEC
81.0000 mg | DELAYED_RELEASE_TABLET | Freq: Every day | ORAL | 3 refills | Status: DC
  Filled 2020-12-01: qty 90, 90d supply, fill #0

## 2020-12-01 MED ORDER — NICOTINE 14 MG/24HR TD PT24
14.0000 mg | MEDICATED_PATCH | TRANSDERMAL | 1 refills | Status: DC
  Filled 2020-12-01: qty 28, 28d supply, fill #0

## 2020-12-01 MED ORDER — DAPAGLIFLOZIN PROPANEDIOL 10 MG PO TABS
10.0000 mg | ORAL_TABLET | Freq: Every day | ORAL | Status: DC
  Filled 2020-12-01: qty 1

## 2020-12-01 MED ORDER — CARVEDILOL 25 MG PO TABS: 25.0000 mg | ORAL_TABLET | Freq: Two times a day (BID) | ORAL | Status: DC

## 2020-12-01 MED ORDER — LOSARTAN POTASSIUM 25 MG PO TABS
25.0000 mg | ORAL_TABLET | Freq: Every day | ORAL | 11 refills | Status: DC
  Filled 2020-12-01: qty 30, 30d supply, fill #0

## 2020-12-01 MED ORDER — EMPAGLIFLOZIN 10 MG PO TABS
10.0000 mg | ORAL_TABLET | Freq: Every day | ORAL | Status: DC
  Administered 2020-12-01: 10 mg via ORAL
  Filled 2020-12-01: qty 1

## 2020-12-01 MED ORDER — CARVEDILOL 25 MG PO TABS
25.0000 mg | ORAL_TABLET | Freq: Two times a day (BID) | ORAL | 11 refills | Status: DC
  Filled 2020-12-01: qty 60, 30d supply, fill #0

## 2020-12-01 MED ORDER — NITROGLYCERIN 0.4 MG SL SUBL: 0.4000 mg | SUBLINGUAL_TABLET | SUBLINGUAL | Status: DC | PRN

## 2020-12-01 MED ORDER — ATORVASTATIN CALCIUM 80 MG PO TABS
80.0000 mg | ORAL_TABLET | Freq: Every day | ORAL | 3 refills | Status: DC
  Filled 2020-12-01: qty 90, 90d supply, fill #0

## 2020-12-01 MED ORDER — EMPAGLIFLOZIN 10 MG PO TABS
10.0000 mg | ORAL_TABLET | Freq: Every day | ORAL | 11 refills | Status: DC
  Filled 2020-12-01: qty 14, 14d supply, fill #0

## 2020-12-01 MED ORDER — INFLUENZA VAC SPLIT QUAD 0.5 ML IM SUSY: 0.5000 mL | PREFILLED_SYRINGE | INTRAMUSCULAR | Status: DC

## 2020-12-01 MED ORDER — NITROGLYCERIN 0.4 MG SL SUBL
0.4000 mg | SUBLINGUAL_TABLET | SUBLINGUAL | 1 refills | Status: DC | PRN
  Filled 2020-12-01: qty 25, 7d supply, fill #0

## 2020-12-01 MED ORDER — AMLODIPINE BESYLATE 10 MG PO TABS
10.0000 mg | ORAL_TABLET | Freq: Every day | ORAL | 11 refills | Status: DC
  Filled 2020-12-01: qty 30, 30d supply, fill #0

## 2020-12-01 MED ORDER — LOSARTAN POTASSIUM 25 MG PO TABS
25.0000 mg | ORAL_TABLET | Freq: Every day | ORAL | Status: DC
  Administered 2020-12-01: 25 mg via ORAL
  Filled 2020-12-01: qty 1

## 2020-12-01 MED ORDER — TICAGRELOR 90 MG PO TABS
90.0000 mg | ORAL_TABLET | Freq: Two times a day (BID) | ORAL | 11 refills | Status: DC
  Filled 2020-12-01: qty 60, 30d supply, fill #0

## 2020-12-01 MED ORDER — INFLUENZA VAC SPLIT QUAD 0.5 ML IM SUSY
0.5000 mL | PREFILLED_SYRINGE | Freq: Once | INTRAMUSCULAR | Status: AC
  Administered 2020-12-01: 0.5 mL via INTRAMUSCULAR
  Filled 2020-12-01: qty 0.5

## 2020-12-01 NOTE — TOC Benefit Eligibility Note (Signed)
Patient Product/process development scientist completed.    The patient is currently admitted and upon discharge could be taking BiDil 20-37.5 mg tablets.  The current 30 day co-pay is, $217.74 due to a $2,697.08 deductible remaining.   The patient is insured through [employer]     Roland Earl, CPhT Pharmacy Patient Advocate Specialist  Antimicrobial Stewardship Team Direct Number: [phone]  Fax: [phone]

## 2020-12-01 NOTE — Discharge Summary (Addendum)
Discharge Summary    Patient ID: Henry Simmons MRN: [id number]; DOB: Jun 11, 1970  Admit date: 11/29/2020 Discharge date: 12/01/2020  PCP:  Jac Canavan, PA-C   [facility] Providers Cardiologist:  Lance Muss, MD   Discharge Diagnoses    Principal Problem:   Non-ST elevation (NSTEMI) myocardial infarction [facility]) Active Problems:   Essential hypertension, malignant   History of CVA (cerebrovascular accident) without residual deficits   Tobacco use   Dyslipidemia   Ischemic cardiomyopathy    Diagnostic Studies/Procedures    Cath: 11/30/2020    Mid LM to Dist LM lesion is 25% stenosed.   Mid LAD lesion is 90% stenosed.   2nd Diag lesion is 20% stenosed.   1st Mrg lesion is 100% stenosed.   Mid Cx to Dist Cx lesion is 100% stenosed.   Prox RCA lesion is 50% stenosed.   Mid RCA to Dist RCA lesion is 40% stenosed.   Dist RCA lesion is 65% stenosed.   Ramus lesion is 80% stenosed.   A drug-eluting stent was successfully placed using a STENT ONYX FRONTIER [id number].   Post intervention, there is a 0% residual stenosis.   LV end diastolic pressure is normal.   3 vessel CAD. The culprit lesion is the first OM which is occluded. The distal LCx is also occluded. There is a segmental 90% mid LAD stenosis. 80% ramus intermediate which is small to moderate in size. 65% distal RCA. Normal LVEDP Successful PCI of the mid LAD with 2.75 x 38 mm Onyx Frontier with IVUS guidance   Plan: DAPT for one year. Aggressive risk factor modification. Will treat residual disease medically. Anticipate DC tomorrow.   Diagnostic Dominance: Right Intervention    Echo: 11/29/20  IMPRESSIONS     1. Consider cardiac amyloid.   2. Left ventricular ejection fraction, by estimation, is 35 to 40%. The  left ventricle has severely decreased function. The left ventricle  demonstrates global hypokinesis. There is severe concentric left  ventricular hypertrophy. Left ventricular   diastolic parameters are consistent with Grade II diastolic dysfunction  (pseudonormalization).   3. Right ventricular systolic function is normal. The right ventricular  size is normal.   4. The pericardial effusion is anterior to the right ventricle.   5. The mitral valve is normal in structure. Trivial mitral valve  regurgitation. No evidence of mitral stenosis.   6. The aortic valve is normal in structure. Aortic valve regurgitation is  not visualized. No aortic stenosis is present.   7. The inferior vena cava is normal in size with greater than 50%  respiratory variability, suggesting right atrial pressure of 3 mmHg.   Conclusion(s)/Recommendation(s): Findings consistent with hypertrophic  cardiomyopathy.   FINDINGS   Left Ventricle: Left ventricular ejection fraction, by estimation, is 35  to 40%. The left ventricle has severely decreased function. The left  ventricle demonstrates global hypokinesis. The left ventricular internal  cavity size was normal in size. There  is severe concentric left ventricular hypertrophy. Left ventricular  diastolic parameters are consistent with Grade II diastolic dysfunction  (pseudonormalization).      LV Wall Scoring:  The posterior wall is akinetic.   Right Ventricle: The right ventricular size is normal. No increase in  right ventricular wall thickness. Right ventricular systolic function is  normal.   Left Atrium: Left atrial size was normal in size.   Right Atrium: Right atrial size was normal in size.   Pericardium: Trivial pericardial effusion is present. The pericardial  effusion is  anterior to the right ventricle.   Mitral Valve: The mitral valve is normal in structure. Trivial mitral  valve regurgitation. No evidence of mitral valve stenosis.   Tricuspid Valve: The tricuspid valve is normal in structure. Tricuspid  valve regurgitation is not demonstrated. No evidence of tricuspid  stenosis.   Aortic Valve: The aortic  valve is normal in structure. Aortic valve  regurgitation is not visualized. No aortic stenosis is present. Aortic  valve mean gradient measures 4.0 mmHg. Aortic valve peak gradient measures  7.8 mmHg. Aortic valve area, by VTI  measures 2.44 cm.   Pulmonic Valve: The pulmonic valve was normal in structure. Pulmonic valve  regurgitation is not visualized. No evidence of pulmonic stenosis.   Aorta: The aortic root is normal in size and structure.   Venous: The inferior vena cava is normal in size with greater than 50%  respiratory variability, suggesting right atrial pressure of 3 mmHg.   IAS/Shunts: No atrial level shunt detected by color flow Doppler. Agitated  saline contrast was given intravenously to evaluate for intracardiac  shunting.  _____________   History of Present Illness     Henry Simmons is a 50 y.o. male with past medical history of hypertension, CVA, hyperlipidemia who presented with chest pain and found to have NSTEMI.  Reported Saturday prior to admission he ate a pizza and felt sudden onset of chest pain shortly afterwards.  He attributed this to his GERD.  Noted he was attempting to plug his phone into the wall later that evening and had a syncopal event and fell.  Unclear if he had complete loss of consciousness as of it was very brief.  Admits or accompanying with diaphoresis, nausea and vomiting.  States his chest pain continues to wax and wane over the weekend.  Felt like something sitting on his chest.  The morning of admission he woke up early and went to work and experienced another episode of diaphoresis and lightheadedness which prompted ER evaluation.  In the ED his labs showed initial troponin of greater than 24,000.  EKG showed sinus tachycardia with T wave inversion in inferior and lateral leads.  CT chest abdomen pelvis was negative for dissection or PE but showed an incidental finding of 5.4 cm fibro-osseous on proximal right femur with recommendation for  outpatient MRI.   Hospital Course      NSTEMI: High-sensitivity troponin peaked at greater than 24,000.  He was treated with IV heparin and nitro.  He underwent cardiac catheterization noted above with three-vessel CAD with culprit lesion felt to be first OM which was occluded.  Occluded distal left circumflex with 90% mid LAD stenosis treated with PCI/DES x1 with IVUS guidance.  Also has 80% ramus intermediate lesion and 65% distal RCA lesion to be treated medically.  Placed on DAPT with aspirin/Brilinta for at least 1 year.  Evaluated by cardiac rehab.  HFrEF/ICM: Echo noted EF of 35 to 40%, global hypokinesis with severe LVH, grade 2 diastolic dysfunction.  Trivial pericardial effusion anterior to right ventricle.  Notes mention consideration of cardiac amyloid as well as hypertrophic cardiomyopathy, but suspect this is more likely related to severe LVH.  ARB was initially held on admission with elevated creatinine but improved. --GDMT: Continue Coreg which was titrated to 25 mg twice daily, losartan 25 mg daily, and Jardiance.  He is also on Norvasc 10 mg daily, ideally would transition to [facility] as an outpatient with consideration of DC Norvasc, pending renal function.  Hypertension: Blood pressures  borderline controlled but improved. --Continue Coreg 25 mg twice daily, losartan 25 mg daily and Norvasc 10 mg daily.  As above would consider transition to [facility] as an outpatient pending stable renal function  Hyperlipidemia: LDL 108 --Started on atorvastatin 80 mg daily --FLP/LFTs in 8 weeks  AKI: Creatinine 1.8 on admission, peaked at 1.9.  Improved to 1.3 at discharge --BMEt at follow-up  Tobacco use: Cessation advised --Nicotine patches at DC  Patient was seen by Dr. Eldridge Dace and deemed stable for discharge.  Follow-up in the office been arranged.  Medications sent to [facility] pharmacy.  Educated by [employer].D. prior to discharge.  Family at the bedside does report they would like to  transition care to Dr. Algie Coffer.  I informed them they will have to make this appointment as he is independent practice.  We will leave current follow-up in our office in the event he is unable to be scheduled with Dr. Algie Coffer.  Did the patient have an acute coronary syndrome (MI, NSTEMI, STEMI, etc) this admission?:  Yes                               AHA/ACC Clinical Performance & Quality Measures: Aspirin prescribed? - Yes ADP Receptor Inhibitor (Plavix/Clopidogrel, Brilinta/Ticagrelor or Effient/Prasugrel) prescribed (includes medically managed patients)? - Yes Beta Blocker prescribed? - Yes High Intensity Statin (Lipitor 40-80mg  or Crestor 20-40mg ) prescribed? - Yes EF assessed during THIS hospitalization? - Yes For EF <40%, was ACEI/ARB prescribed? - Yes For EF <40%, Aldosterone Antagonist (Spironolactone or Eplerenone) prescribed? - No - Reason:  Consider at outpatient follow up appt Cardiac Rehab Phase II ordered (including medically managed patients)? - Yes       The patient will be scheduled for a TOC follow up appointment in 10/14 days.  A message has been sent to the [facility] and Scheduling Pool at the office where the patient should be seen for follow up.  _____________  Discharge Vitals Blood pressure (!) 165/99, pulse 84, temperature 98.5 F (36.9 C), temperature source Oral, resp. rate 17, height 6\' 2"  (1.88 m), weight 81.9 kg, SpO2 100 %.  Filed Weights   11/29/20 1113 11/30/20 0245 12/01/20 0426  Weight: 83.5 kg 82.8 kg 81.9 kg    Labs & Radiologic Studies    CBC Recent Labs    11/30/20 0305 12/01/20 0244  WBC 9.8 9.9  HGB 13.7 12.9*  HCT 41.4 39.0  MCV 85.7 86.5  PLT 150 121*   Basic Metabolic Panel Recent Labs    12/03/20 0305 12/01/20 0244  NA 134* 130*  K 3.9 4.3  CL 104 100  CO2 23 25  GLUCOSE 119* 127*  BUN 22* 15  CREATININE 1.56* 1.31*  CALCIUM 8.6* 8.5*  MG 2.5*  --    Liver Function Tests Recent Labs    11/29/20 1258  AST 308*  ALT  61*  ALKPHOS 61  BILITOT 1.3*  PROT 7.7  ALBUMIN 4.2   Recent Labs    11/29/20 1258  LIPASE 43   High Sensitivity Troponin:   Recent Labs  Lab 11/29/20 1258 11/29/20 1409  TROPONINIHS >24,000* >24,000*    BNP Invalid input(s): POCBNP D-Dimer No results for input(s): DDIMER in the last 72 hours. Hemoglobin A1C Recent Labs    11/30/20 0305  HGBA1C 5.3   Fasting Lipid Panel Recent Labs    11/30/20 0305  CHOL 157  HDL 35*  LDLCALC 108*  TRIG 70  CHOLHDL 4.5   Thyroid Function Tests Recent Labs    11/30/20 0305  TSH 1.016   _____________  CARDIAC CATHETERIZATION  Result Date: 11/30/2020   Mid LM to Dist LM lesion is 25% stenosed.   Mid LAD lesion is 90% stenosed.   2nd Diag lesion is 20% stenosed.   1st Mrg lesion is 100% stenosed.   Mid Cx to Dist Cx lesion is 100% stenosed.   Prox RCA lesion is 50% stenosed.   Mid RCA to Dist RCA lesion is 40% stenosed.   Dist RCA lesion is 65% stenosed.   Ramus lesion is 80% stenosed.   A drug-eluting stent was successfully placed using a STENT ONYX FRONTIER [id number].   Post intervention, there is a 0% residual stenosis.   LV end diastolic pressure is normal. 3 vessel CAD. The culprit lesion is the first OM which is occluded. The distal LCx is also occluded. There is a segmental 90% mid LAD stenosis. 80% ramus intermediate which is small to moderate in size. 65% distal RCA. Normal LVEDP Successful PCI of the mid LAD with 2.75 x 38 mm Onyx Frontier with IVUS guidance Plan: DAPT for one year. Aggressive risk factor modification. Will treat residual disease medically. Anticipate DC tomorrow.   DG Chest Portable 1 View  Result Date: 11/29/2020 CLINICAL DATA:  Chest pain. EXAM: PORTABLE CHEST 1 VIEW COMPARISON:  09/15/2012 FINDINGS: Heart size is upper limits of normal but probably accentuated by the technique. Both lungs are clear. Negative for a pneumothorax. Bone structures are unremarkable. IMPRESSION: No active disease.  Electronically Signed   By: Richarda Overlie M.D.   On: 11/29/2020 12:06   ECHOCARDIOGRAM COMPLETE  Result Date: 11/29/2020    ECHOCARDIOGRAM REPORT   Patient Name:   Henry Simmons Date of Exam: 11/29/2020 Medical Rec #:  [id number]      Height:       74.0 in Accession #:    [id number]     Weight:       184.0 lb Date of Birth:  [birth year]      BSA:          2.098 m Patient Age:    50 years       BP:           194/133 mmHg Patient Gender: M              HR:           102 bpm. Exam Location:  Inpatient Procedure: 2D Echo, Cardiac Doppler, Color Doppler, Saline Contrast Bubble Study            and Strain Analysis Indications:    Myocardial Infarct  History:        Patient has no prior history of Echocardiogram examinations.                 Stroke; Risk Factors:Hypertension.  Sonographer:    Roosvelt Maser RDCS Referring Phys: [id number] Wendall Stade IMPRESSIONS  1. Consider cardiac amyloid.  2. Left ventricular ejection fraction, by estimation, is 35 to 40%. The left ventricle has severely decreased function. The left ventricle demonstrates global hypokinesis. There is severe concentric left ventricular hypertrophy. Left ventricular diastolic parameters are consistent with Grade II diastolic dysfunction (pseudonormalization).  3. Right ventricular systolic function is normal. The right ventricular size is normal.  4. The pericardial effusion is anterior to the right ventricle.  5. The mitral valve is normal in structure. Trivial mitral valve regurgitation. No evidence of  mitral stenosis.  6. The aortic valve is normal in structure. Aortic valve regurgitation is not visualized. No aortic stenosis is present.  7. The inferior vena cava is normal in size with greater than 50% respiratory variability, suggesting right atrial pressure of 3 mmHg. Conclusion(s)/Recommendation(s): Findings consistent with hypertrophic cardiomyopathy. FINDINGS  Left Ventricle: Left ventricular ejection fraction, by estimation, is 35 to 40%. The left  ventricle has severely decreased function. The left ventricle demonstrates global hypokinesis. The left ventricular internal cavity size was normal in size. There is severe concentric left ventricular hypertrophy. Left ventricular diastolic parameters are consistent with Grade II diastolic dysfunction (pseudonormalization).  LV Wall Scoring: The posterior wall is akinetic. Right Ventricle: The right ventricular size is normal. No increase in right ventricular wall thickness. Right ventricular systolic function is normal. Left Atrium: Left atrial size was normal in size. Right Atrium: Right atrial size was normal in size. Pericardium: Trivial pericardial effusion is present. The pericardial effusion is anterior to the right ventricle. Mitral Valve: The mitral valve is normal in structure. Trivial mitral valve regurgitation. No evidence of mitral valve stenosis. Tricuspid Valve: The tricuspid valve is normal in structure. Tricuspid valve regurgitation is not demonstrated. No evidence of tricuspid stenosis. Aortic Valve: The aortic valve is normal in structure. Aortic valve regurgitation is not visualized. No aortic stenosis is present. Aortic valve mean gradient measures 4.0 mmHg. Aortic valve peak gradient measures 7.8 mmHg. Aortic valve area, by VTI measures 2.44 cm. Pulmonic Valve: The pulmonic valve was normal in structure. Pulmonic valve regurgitation is not visualized. No evidence of pulmonic stenosis. Aorta: The aortic root is normal in size and structure. Venous: The inferior vena cava is normal in size with greater than 50% respiratory variability, suggesting right atrial pressure of 3 mmHg. IAS/Shunts: No atrial level shunt detected by color flow Doppler. Agitated saline contrast was given intravenously to evaluate for intracardiac shunting.  LEFT VENTRICLE PLAX 2D LVIDd:         4.60 cm LVIDs:         3.40 cm LV PW:         1.60 cm LV IVS:        1.90 cm LVOT diam:     2.20 cm      3D Volume EF: LV SV:          47           3D EF:        37 % LV SV Index:   22           LV EDV:       173 ml LVOT Area:     3.80 cm     LV ESV:       110 ml                             LV SV:        63 ml  LV Volumes (MOD) LV vol d, MOD A2C: 128.0 ml LV vol d, MOD A4C: 159.0 ml LV vol s, MOD A2C: 96.1 ml LV vol s, MOD A4C: 114.0 ml LV SV MOD A2C:     31.9 ml LV SV MOD A4C:     159.0 ml LV SV MOD BP:      47.2 ml RIGHT VENTRICLE RV Basal diam:  3.20 cm LEFT ATRIUM           Index  RIGHT ATRIUM           Index LA diam:      4.10 cm 1.95 cm/m   RA Area:     18.60 cm LA Vol (A2C): 45.4 ml 21.64 ml/m  RA Volume:   55.20 ml  26.31 ml/m  AORTIC VALVE AV Area (Vmax):    2.26 cm AV Area (Vmean):   2.19 cm AV Area (VTI):     2.44 cm AV Vmax:           140.00 cm/s AV Vmean:          90.600 cm/s AV VTI:            0.193 m AV Peak Grad:      7.8 mmHg AV Mean Grad:      4.0 mmHg LVOT Vmax:         83.30 cm/s LVOT Vmean:        52.200 cm/s LVOT VTI:          0.124 m LVOT/AV VTI ratio: 0.64  AORTA Ao Root diam: 3.30 cm Ao Asc diam:  3.70 cm  SHUNTS Systemic VTI:  0.12 m Systemic Diam: 2.20 cm Donato Schultz MD Electronically signed by Donato Schultz MD Signature Date/Time: 11/29/2020/5:19:12 PM    Final    CT Angio Chest/Abd/Pel for Dissection W and/or W/WO  Result Date: 11/29/2020 CLINICAL DATA:  Chest pain or back pain, aortic dissection suspected. Syncopal episode 2 days ago. Near syncope today. Chest pain and hypertension. EXAM: CT ANGIOGRAPHY CHEST, ABDOMEN AND PELVIS TECHNIQUE: Non-contrast CT of the chest was initially obtained. Multidetector CT imaging through the chest, abdomen and pelvis was performed using the standard protocol during bolus administration of intravenous contrast. Multiplanar reconstructed images and MIPs were obtained and reviewed to evaluate the vascular anatomy. CONTRAST:  OMNIPAQUE IOHEXOL 350 MG/ML SOLN COMPARISON:  Chest CT 09/18/2010 FINDINGS: CTA CHEST FINDINGS Cardiovascular: There is no evidence of a  thoracic aortic intramural hematoma on noncontrast images. There is no evidence of a thoracic aortic dissection or aneurysm. There is a standard 3 vessel aortic arch with wide patency of the proximal arch vessels. No central pulmonary embolus is evident. The heart is enlarged including prominent left ventricular enlargement. Left ventricular apex myocardial thinning may reflect a prior infarct. There is a trace pericardial effusion. Mediastinum/Nodes: No enlarged axillary, mediastinal, or hilar lymph nodes. Unremarkable thyroid and esophagus. Lungs/Pleura: No pleural effusion or pneumothorax. Scattered subcentimeter nodular densities in the airway bilaterally with the largest measuring 9 mm in the distal aspect of the left mainstem bronchus, in different locations than the nodules on the 2012 CT. Opacification of a right lower lobe segmental/subsegmental bronchus. No airspace consolidation. Musculoskeletal: No acute osseous abnormality or suspicious osseous lesion. Review of the MIP images confirms the above findings. CTA ABDOMEN AND PELVIS FINDINGS VASCULAR Aorta: Normal caliber aorta without aneurysm, dissection, vasculitis or significant stenosis. Celiac: Patent without evidence of aneurysm, dissection, vasculitis or significant stenosis. SMA: Patent without evidence of aneurysm, dissection, vasculitis or significant stenosis. Renals: Patent bilaterally without evidence of an aneurysm, dissection, vasculitis, fibromuscular dysplasia, or a significant stenosis on the left. Mild stenosis of the proximal right renal artery. IMA: Patent without evidence of aneurysm, dissection, vasculitis or significant stenosis. Inflow: Patent with atherosclerosis primarily involving the internal iliac arteries. No aneurysm or dissection. Veins: No obvious venous abnormality within the limitations of this arterial phase study. Review of the MIP images confirms the above findings. NON-VASCULAR Hepatobiliary: No focal liver  abnormality is seen. No gallstones, gallbladder wall thickening, or biliary dilatation. Pancreas: Unremarkable. Spleen: Unremarkable. Adrenals/Urinary Tract: Unremarkable adrenal glands. No renal calculi or hydronephrosis. Subcentimeter hypodensity in the left kidney, too small to fully characterize. Unremarkable bladder. Stomach/Bowel: The stomach is unremarkable. There is no evidence of bowel obstruction or inflammation. Lymphatic: No enlarged lymph nodes. Reproductive: Unremarkable prostate. Other: No ascites or pneumoperitoneum. Small fat containing left inguinal hernia. Musculoskeletal: No acute osseous abnormality. 5.4 cm mixed lucent and sclerotic lesion eccentrically located posteriorly in the intertrochanteric region of the right femur. Its margins are largely sclerotic and well defined, however portions of the posterior cortex appear thinned and possibly disrupted (particularly inferiorly as on series 6, image 326). No extraosseous soft tissue mass. Review of the MIP images confirms the above findings. IMPRESSION: 1. No evidence of aortic dissection, aneurysm, or other acute abnormality in the chest, abdomen, or pelvis. 2. Scattered subcentimeter nodular densities in the airway bilaterally, potentially mucous as these are in different locations than the nodules on the 2012 CT which have resolved. Coexistent polyps or other neoplasm are not excluded however. 3. Cardiomegaly. 4. 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 Given focal areas of potential cortical disruption, a nonemergent MRI without and with contrast is recommended for further characterization as an outpatient. Electronically Signed   By: Sebastian Ache M.D.   On: 11/29/2020 14:25   Disposition   Pt is being discharged home today in good condition.  Follow-up Plans & Appointments     Follow-up Information     Dyann Kief, PA-C Follow up on 12/15/2020.   Specialty: Cardiology Why: at 11:45am for your follow up appt  with Dr. Hoyle Barr PA Contact information: [address] [name] [address] [phone]                Discharge Instructions     AMB Referral to Cardiac Rehabilitation - Phase II   Complete by: As directed    Diagnosis:  Coronary Stents NSTEMI     After initial evaluation and assessments completed: Virtual Based Care may be provided alone or in conjunction with Phase 2 Cardiac Rehab based on patient barriers.: Yes   Call MD for:  difficulty breathing, headache or visual disturbances   Complete by: As directed    Call MD for:  persistant dizziness or light-headedness   Complete by: As directed    Call MD for:  redness, tenderness, or signs of infection (pain, swelling, redness, odor or green/yellow discharge around incision site)   Complete by: As directed    Diet - low sodium heart healthy   Complete by: As directed    Discharge instructions   Complete by: As directed    Radial Site Care Refer to this sheet in the next few weeks. These instructions provide you with information on caring for yourself after your procedure. Your caregiver may also give you more specific instructions. Your treatment has been planned according to current medical practices, but problems sometimes occur. Call your caregiver if you have any problems or questions after your procedure. HOME CARE INSTRUCTIONS You may shower the day after the procedure. Remove the bandage (dressing) and gently wash the site with plain soap and water. Gently pat the site dry.  Do not apply powder or lotion to the site.  Do not submerge the affected site in water for 3 to 5 days.  Inspect the site at least twice daily.  Do not flex or bend the affected  arm for 24 hours.  No lifting over 5 pounds (2.3 kg) for 5 days after your procedure.  Do not drive home if you are discharged the same day of the procedure. Have someone else drive you.  You may drive 24 hours after the procedure unless otherwise  instructed by your caregiver.  What to expect: Any bruising will usually fade within 1 to 2 weeks.  Blood that collects in the tissue (hematoma) may be painful to the touch. It should usually decrease in size and tenderness within 1 to 2 weeks.  SEEK IMMEDIATE MEDICAL CARE IF: You have unusual pain at the radial site.  You have redness, warmth, swelling, or pain at the radial site.  You have drainage (other than a small amount of blood on the dressing).  You have chills.  You have a fever or persistent symptoms for more than 72 hours.  You have a fever and your symptoms suddenly get worse.  Your arm becomes pale, cool, tingly, or numb.  You have heavy bleeding from the site. Hold pressure on the site.   PLEASE DO NOT MISS ANY DOSES OF YOUR BRILINTA!!!!! Also keep a log of you blood pressures and bring back to your follow up appt. Please call the office with any questions.   Patients taking blood thinners should generally stay away from medicines like ibuprofen, Advil, Motrin, naproxen, and Aleve due to risk of stomach bleeding. You may take Tylenol as directed or talk to your primary doctor about alternatives.  PLEASE ENSURE THAT YOU DO NOT RUN OUT OF YOUR BRILINTA. This medication is very important to remain on for at least one year. IF you have issues obtaining this medication due to cost please CALL the office 3-5 business days prior to running out in order to prevent missing doses of this medication.   Increase activity slowly   Complete by: As directed        Discharge Medications   Allergies as of 12/01/2020   No Known Allergies      Medication List     STOP taking these medications    clonazePAM 0.5 MG tablet Commonly known as: KLONOPIN       TAKE these medications    amLODipine 10 MG tablet Commonly known as: NORVASC Take 1 tablet (10 mg total) by mouth daily.   aspirin 81 MG EC tablet Take 1 tablet (81 mg total) by mouth daily. Swallow whole. Start taking  on: December 02, 2020   atorvastatin 80 MG tablet Commonly known as: LIPITOR Take 1 tablet (80 mg total) by mouth daily. Start taking on: December 02, 2020   carvedilol 25 MG tablet Commonly known as: COREG Take 1 tablet (25 mg total) by mouth 2 (two) times daily.   empagliflozin 10 MG Tabs tablet Commonly known as: JARDIANCE Take 1 tablet (10 mg total) by mouth daily.   IRON PO Take 1 tablet by mouth daily.   losartan 25 MG tablet Commonly known as: COZAAR Take 1 tablet (25 mg total) by mouth daily.   nitroGLYCERIN 0.4 MG SL tablet Commonly known as: NITROSTAT Place 1 tablet (0.4 mg total) under the tongue every 5 (five) minutes as needed for chest pain.   ticagrelor 90 MG Tabs tablet Commonly known as: BRILINTA Take 1 tablet (90 mg total) by mouth 2 (two) times daily.   Turmeric 500 MG Caps Take 1 capsule by mouth daily.   vitamin C 500 MG tablet Commonly known as: ASCORBIC ACID Take 500 mg by  mouth daily.          Outstanding Labs/Studies   FLP/LFTs in 8 weeks BMET at follow Echo 3 months  Duration of Discharge Encounter   Greater than 30 minutes including physician time.  Signed, Laverda Page, NP 12/01/2020, 11:04 AM   I have examined the patient and reviewed assessment and plan and discussed with patient.  Agree with above as stated.    GEN: Well nourished, well developed, in no acute distress  HEENT: normal  Neck: no JVD, carotid bruits, or masses Cardiac: RRR; no murmurs, rubs, or gallops,no edema  Respiratory:  clear to auscultation bilaterally, normal work of breathing GI: soft, nontender, nondistended,  MS: no deformity or atrophy ; no radial hematoma Skin: warm and dry, no rash Neuro:  Strength and sensation are intact Psych: euthymic mood, full affect   Continue DAPT with aggressive secondary prevention.  BP control most important.  Renal function improving.  Increase Coreg today.  Can add ACE-I as outpatient.  High dose statin as  well.   Healthy diet and regular exercise will be beneficial. Needs to stop smoking.  OK for discharge later today.  Lance Muss

## 2020-12-01 NOTE — TOC Transition Note (Signed)
Transition of Care [facility]) - CM/SW Discharge Note   Patient Details  Name: Henry Simmons MRN: [id number] Date of Birth: 07-29-1970  Transition of Care [facility]) CM/SW Contact:  Carles Collet, RN Phone Number: 12/01/2020, 11:11 AM   Clinical Narrative:    Met with patient and wife at bedside. They have all copay cards for medications and first fill is being done through [facility].   No other TOC needs identified at this time.           Patient Goals and CMS Choice        Discharge Placement                       Discharge Plan and Services                                     Social Determinants of Health (SDOH) Interventions     Readmission Risk Interventions No flowsheet data found.

## 2020-12-01 NOTE — Progress Notes (Signed)
Nursing DC note  Patient and wife verbalized understanding of dc instructions. All belongings given to patient. Toc meds given as well. All ivs dcd site unremarkable. Dcd via wheelchair, wife taking him home via car.

## 2020-12-01 NOTE — Progress Notes (Signed)
CARDIAC REHAB PHASE I   PRE:  Rate/Rhythm: 80 off monitor  BP:  Supine:   Sitting: 147/90  Standing:    SaO2: 100 RA  MODE:  Ambulation: 470 ft   POST:  Rate/Rhythm: 76  BP:  Supine:   Sitting: 154/92  Standing:    SaO2: 99 RA 1120-1220 Off arrival pt off monitor. Assisted X 1 to ambulate. Pt states that he is feeling a little lightheaded walking. Gait steady VS stable. Completed MI and stent education with pt and wife. We discussed MI, risk factors, modifications, smoking cessation, heart healthy diet, exercise guidelines, proper use of sL NTG, calling 911 and Outpt. CRP. They voice understanding. I gave pt MI book, tips for quitting smoking information and encouraged him to call the 1-800 quit smoking line to get a coach. He has his stent card. Pt seems motivated to quit smoking and ready to make changes to reduce his risk factors. I strongly encouraged him to take all of his medications and not to stop any without calling the MD to discuss it with them.I will send referral to [place] Out[facility]. CRP.  Melina Copa RN 12/01/2020 12:20 PM

## 2020-12-01 NOTE — Telephone Encounter (Signed)
Hello  [name],     The Pharmacy team is conducting a discharge transitions of care quality improvement initiative. The recommendations below are for your consideration.    Henry Simmons is a 50 y.o. male (MRN: [id number], DOB: 10/01/1970) who was recently hospitalized on 11/29/2020 for NSTEMI. They are anticipated to visit your clinic for post-discharge follow-up and may benefit from assistance with medication initiation and/or access.     Pertinent medication changes -Cath on 12/01/20 with stent placed: Aspirin 81mg  daily and Ticagrelor 90mg  twice daily for a minimum of 12 months  -Jardiance added. The monthly copay is $196.13    Other medication access issues which may benefit from further intervention include: -His Brilinta copay is $282.42 but he will be able to use the monthly copay card. He was supplied a card at discharge -He will be on Jardiance and the co-pay is $196.13 for 30 days. He will be able to use the monthly copay card. He was supplied one at discharge - will need prior authorization if started. If high cost he will be able to use the monthly copay card     If appropriate, please consider the additional therapy recommendations below:   -Losartan was added, consider transition to Entresto at his next appointment -Consider adding spironolactone. Not added during admission due to elevated SCr but trend has gone down with baseline ~ 1.1 -He was still noted with hypertension at discharge. Could try to further titrate losartan, and add spironolactone. With HF could try to get him off norvasc (consider HCTZ if he needs additional HTN medications) -Recheck lipid panel in 8-12 weeks -He could also be considered for Bidil. The 30 day copay is $217.74 due to a $2,697.08 deductible remaining. He should be eligible for a copay card and may be able to get Bidil for $25 per month. More information is available at 10-12  Thank you,    01-22-1972, PharmD Clinical Pharmacist **Pharmacist phone directory can now be found on [email or web address] ([email or web address]).  Listed under [facility].

## 2020-12-07 ENCOUNTER — Telehealth (HOSPITAL_COMMUNITY): Payer: Self-pay

## 2020-12-07 NOTE — Telephone Encounter (Signed)
Spoke to pt wife and she stated that he is interested in the CR program.

## 2020-12-07 NOTE — Telephone Encounter (Signed)
Pt insurance is active and benefits verified through [place] [facility], DED $3,000/$467.63 met, out of pocket $7,600/$467.63 met, co-insurance 25%. no pre-authorization required. Passport, 12/06/2020_0 :23pm, REF# [id number]   Will contact patient to see if he is interested in the Cardiac Rehab Program. If interested, patient will need to complete follow up appt. Once completed, patient will be contacted for scheduling upon review by the RN Navigator.
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
## 2020-12-09 ENCOUNTER — Telehealth (HOSPITAL_COMMUNITY): Payer: Self-pay | Admitting: Pharmacist

## 2020-12-09 NOTE — Telephone Encounter (Signed)
LVM

## 2020-12-10 ENCOUNTER — Telehealth (HOSPITAL_COMMUNITY): Payer: Self-pay | Admitting: Pharmacist

## 2020-12-10 NOTE — Telephone Encounter (Signed)
2nd attempt

## 2020-12-13 ENCOUNTER — Telehealth (HOSPITAL_COMMUNITY): Payer: Self-pay | Admitting: Pharmacist

## 2020-12-13 NOTE — Telephone Encounter (Signed)
3rd attempt

## 2020-12-15 ENCOUNTER — Ambulatory Visit: Payer: 59 | Admitting: Physician Assistant

## 2020-12-29 ENCOUNTER — Telehealth (HOSPITAL_COMMUNITY): Payer: Self-pay | Admitting: *Deleted

## 2020-12-29 NOTE — Telephone Encounter (Signed)
Pt referred to Cardiac rehab.  Pt did not completed follow up on 11/2.  Note on appt said that he was going to another cardiologist.  Unable to see any appt in [facility] or [facility]where.  Called and spoke to pt.  He is not sure of the name or contact information.  Stated his wife handles these things.  Advised pt that we will need a MD referral from whomever is following him for cardiac needs for cardiac rehab.  Pt took my contact information and will have his wife call when she returns. Alanson Aly, BSN Cardiac and Emergency planning/management officer

## 2020-12-30 ENCOUNTER — Encounter (HOSPITAL_COMMUNITY): Payer: Self-pay | Admitting: Radiology

## 2020-12-30 NOTE — Telephone Encounter (Signed)
Pt returned CR phone call and a verbal to talk with his wife. She stated he working out at home and does not need CR.   Closed referral

## 2021-06-07 ENCOUNTER — Inpatient Hospital Stay (HOSPITAL_COMMUNITY): Payer: 59

## 2021-06-07 ENCOUNTER — Emergency Department (HOSPITAL_COMMUNITY): Payer: 59

## 2021-06-07 ENCOUNTER — Other Ambulatory Visit: Payer: Self-pay

## 2021-06-07 ENCOUNTER — Encounter (HOSPITAL_COMMUNITY)
Admission: EM | Disposition: A | Discharge status: Rehabilitation Facility | Payer: Self-pay | Source: Home / Self Care | Attending: Cardiovascular Disease

## 2021-06-07 ENCOUNTER — Inpatient Hospital Stay (HOSPITAL_COMMUNITY): Payer: 59 | Admitting: Anesthesiology

## 2021-06-07 ENCOUNTER — Encounter (HOSPITAL_COMMUNITY): Payer: Self-pay

## 2021-06-07 ENCOUNTER — Inpatient Hospital Stay (HOSPITAL_COMMUNITY)
Admission: EM | Admit: 2021-06-07 | Discharge: 2021-06-14 | DRG: 260 | Disposition: A | Discharge status: Rehabilitation Facility | Payer: 59 | Attending: Cardiovascular Disease | Admitting: Cardiovascular Disease

## 2021-06-07 DIAGNOSIS — I639 Cerebral infarction, unspecified: Secondary | ICD-10-CM | POA: Diagnosis not present

## 2021-06-07 DIAGNOSIS — E785 Hyperlipidemia, unspecified: Secondary | ICD-10-CM | POA: Diagnosis present

## 2021-06-07 DIAGNOSIS — R001 Bradycardia, unspecified: Secondary | ICD-10-CM | POA: Diagnosis present

## 2021-06-07 DIAGNOSIS — I255 Ischemic cardiomyopathy: Secondary | ICD-10-CM | POA: Diagnosis present

## 2021-06-07 DIAGNOSIS — G8191 Hemiplegia, unspecified affecting right dominant side: Secondary | ICD-10-CM | POA: Diagnosis not present

## 2021-06-07 DIAGNOSIS — I6501 Occlusion and stenosis of right vertebral artery: Secondary | ICD-10-CM | POA: Diagnosis present

## 2021-06-07 DIAGNOSIS — I63512 Cerebral infarction due to unspecified occlusion or stenosis of left middle cerebral artery: Secondary | ICD-10-CM | POA: Diagnosis not present

## 2021-06-07 DIAGNOSIS — K5900 Constipation, unspecified: Secondary | ICD-10-CM | POA: Diagnosis not present

## 2021-06-07 DIAGNOSIS — R4701 Aphasia: Secondary | ICD-10-CM | POA: Diagnosis not present

## 2021-06-07 DIAGNOSIS — Z7902 Long term (current) use of antithrombotics/antiplatelets: Secondary | ICD-10-CM

## 2021-06-07 DIAGNOSIS — I63412 Cerebral infarction due to embolism of left middle cerebral artery: Secondary | ICD-10-CM | POA: Diagnosis not present

## 2021-06-07 DIAGNOSIS — I130 Hypertensive heart and chronic kidney disease with heart failure and stage 1 through stage 4 chronic kidney disease, or unspecified chronic kidney disease: Secondary | ICD-10-CM | POA: Diagnosis present

## 2021-06-07 DIAGNOSIS — J9589 Other postprocedural complications and disorders of respiratory system, not elsewhere classified: Secondary | ICD-10-CM | POA: Diagnosis not present

## 2021-06-07 DIAGNOSIS — I63232 Cerebral infarction due to unspecified occlusion or stenosis of left carotid arteries: Secondary | ICD-10-CM | POA: Diagnosis not present

## 2021-06-07 DIAGNOSIS — Z833 Family history of diabetes mellitus: Secondary | ICD-10-CM

## 2021-06-07 DIAGNOSIS — I2510 Atherosclerotic heart disease of native coronary artery without angina pectoris: Secondary | ICD-10-CM | POA: Diagnosis present

## 2021-06-07 DIAGNOSIS — G460 Middle cerebral artery syndrome: Secondary | ICD-10-CM | POA: Diagnosis present

## 2021-06-07 DIAGNOSIS — Z7901 Long term (current) use of anticoagulants: Secondary | ICD-10-CM

## 2021-06-07 DIAGNOSIS — I5022 Chronic systolic (congestive) heart failure: Secondary | ICD-10-CM | POA: Diagnosis present

## 2021-06-07 DIAGNOSIS — I69351 Hemiplegia and hemiparesis following cerebral infarction affecting right dominant side: Secondary | ICD-10-CM | POA: Diagnosis not present

## 2021-06-07 DIAGNOSIS — Z20822 Contact with and (suspected) exposure to covid-19: Secondary | ICD-10-CM | POA: Diagnosis present

## 2021-06-07 DIAGNOSIS — J9690 Respiratory failure, unspecified, unspecified whether with hypoxia or hypercapnia: Secondary | ICD-10-CM | POA: Diagnosis not present

## 2021-06-07 DIAGNOSIS — Z955 Presence of coronary angioplasty implant and graft: Secondary | ICD-10-CM

## 2021-06-07 DIAGNOSIS — I671 Cerebral aneurysm, nonruptured: Secondary | ICD-10-CM | POA: Diagnosis present

## 2021-06-07 DIAGNOSIS — I214 Non-ST elevation (NSTEMI) myocardial infarction: Principal | ICD-10-CM | POA: Diagnosis present

## 2021-06-07 DIAGNOSIS — I6389 Other cerebral infarction: Secondary | ICD-10-CM | POA: Diagnosis not present

## 2021-06-07 DIAGNOSIS — I10 Essential (primary) hypertension: Secondary | ICD-10-CM | POA: Diagnosis not present

## 2021-06-07 DIAGNOSIS — I6340 Cerebral infarction due to embolism of unspecified cerebral artery: Secondary | ICD-10-CM | POA: Insufficient documentation

## 2021-06-07 DIAGNOSIS — I422 Other hypertrophic cardiomyopathy: Secondary | ICD-10-CM | POA: Diagnosis present

## 2021-06-07 DIAGNOSIS — I25110 Atherosclerotic heart disease of native coronary artery with unstable angina pectoris: Secondary | ICD-10-CM | POA: Diagnosis present

## 2021-06-07 DIAGNOSIS — R1310 Dysphagia, unspecified: Secondary | ICD-10-CM | POA: Diagnosis not present

## 2021-06-07 DIAGNOSIS — Z7982 Long term (current) use of aspirin: Secondary | ICD-10-CM | POA: Diagnosis not present

## 2021-06-07 DIAGNOSIS — I442 Atrioventricular block, complete: Secondary | ICD-10-CM | POA: Diagnosis present

## 2021-06-07 DIAGNOSIS — I63011 Cerebral infarction due to thrombosis of right vertebral artery: Secondary | ICD-10-CM

## 2021-06-07 DIAGNOSIS — Z79899 Other long term (current) drug therapy: Secondary | ICD-10-CM | POA: Diagnosis not present

## 2021-06-07 DIAGNOSIS — I672 Cerebral atherosclerosis: Secondary | ICD-10-CM

## 2021-06-07 DIAGNOSIS — I441 Atrioventricular block, second degree: Secondary | ICD-10-CM | POA: Diagnosis present

## 2021-06-07 DIAGNOSIS — E876 Hypokalemia: Secondary | ICD-10-CM | POA: Diagnosis not present

## 2021-06-07 DIAGNOSIS — I6322 Cerebral infarction due to unspecified occlusion or stenosis of basilar arteries: Secondary | ICD-10-CM | POA: Diagnosis not present

## 2021-06-07 DIAGNOSIS — M79603 Pain in arm, unspecified: Secondary | ICD-10-CM | POA: Diagnosis not present

## 2021-06-07 DIAGNOSIS — I651 Occlusion and stenosis of basilar artery: Secondary | ICD-10-CM | POA: Diagnosis present

## 2021-06-07 DIAGNOSIS — I739 Peripheral vascular disease, unspecified: Secondary | ICD-10-CM | POA: Diagnosis present

## 2021-06-07 DIAGNOSIS — I6522 Occlusion and stenosis of left carotid artery: Secondary | ICD-10-CM | POA: Diagnosis present

## 2021-06-07 DIAGNOSIS — R29720 NIHSS score 20: Secondary | ICD-10-CM | POA: Diagnosis not present

## 2021-06-07 DIAGNOSIS — N179 Acute kidney failure, unspecified: Secondary | ICD-10-CM | POA: Diagnosis not present

## 2021-06-07 DIAGNOSIS — I63521 Cerebral infarction due to unspecified occlusion or stenosis of right anterior cerebral artery: Secondary | ICD-10-CM | POA: Diagnosis not present

## 2021-06-07 DIAGNOSIS — Z8249 Family history of ischemic heart disease and other diseases of the circulatory system: Secondary | ICD-10-CM

## 2021-06-07 DIAGNOSIS — N183 Chronic kidney disease, stage 3 unspecified: Secondary | ICD-10-CM | POA: Diagnosis present

## 2021-06-07 DIAGNOSIS — R1311 Dysphagia, oral phase: Secondary | ICD-10-CM | POA: Diagnosis not present

## 2021-06-07 DIAGNOSIS — Z9911 Dependence on respirator [ventilator] status: Secondary | ICD-10-CM

## 2021-06-07 DIAGNOSIS — R29810 Facial weakness: Secondary | ICD-10-CM | POA: Diagnosis not present

## 2021-06-07 DIAGNOSIS — N189 Chronic kidney disease, unspecified: Secondary | ICD-10-CM | POA: Diagnosis not present

## 2021-06-07 HISTORY — PX: IR ANGIO INTRA EXTRACRAN SEL COM CAROTID INNOMINATE UNI R MOD SED: IMG5359

## 2021-06-07 HISTORY — PX: IR PERCUTANEOUS ART THROMBECTOMY/INFUSION INTRACRANIAL INC DIAG ANGIO: IMG6087

## 2021-06-07 HISTORY — PX: LEFT HEART CATH AND CORONARY ANGIOGRAPHY: CATH118249

## 2021-06-07 HISTORY — PX: IR ANGIO VERTEBRAL SEL VERTEBRAL UNI L MOD SED: IMG5367

## 2021-06-07 HISTORY — PX: IR 3D INDEPENDENT WKST: IMG2385

## 2021-06-07 HISTORY — PX: RADIOLOGY WITH ANESTHESIA: SHX6223

## 2021-06-07 HISTORY — PX: IR ANGIO INTRA EXTRACRAN SEL INTERNAL CAROTID UNI L MOD SED: IMG5361

## 2021-06-07 LAB — ECHOCARDIOGRAM COMPLETE
AR max vel: 2.64 cm2
AV Peak grad: 11.2 mmHg
Ao pk vel: 1.67 m/s
Area-P 1/2: 2.99 cm2
Height: 74 in
S' Lateral: 4.5 cm
Weight: 3668.45 oz

## 2021-06-07 LAB — CBC WITH DIFFERENTIAL/PLATELET
Abs Immature Granulocytes: 0.05 10*3/uL (ref 0.00–0.07)
Basophils Absolute: 0 10*3/uL (ref 0.0–0.1)
Basophils Relative: 0 %
Eosinophils Absolute: 0 10*3/uL (ref 0.0–0.5)
Eosinophils Relative: 0 %
HCT: 41.5 % (ref 39.0–52.0)
Hemoglobin: 13.2 g/dL (ref 13.0–17.0)
Immature Granulocytes: 1 %
Lymphocytes Relative: 12 %
Lymphs Abs: 1.2 10*3/uL (ref 0.7–4.0)
MCH: 28.3 pg (ref 26.0–34.0)
MCHC: 31.8 g/dL (ref 30.0–36.0)
MCV: 88.9 fL (ref 80.0–100.0)
Monocytes Absolute: 1.3 10*3/uL — ABNORMAL HIGH (ref 0.1–1.0)
Monocytes Relative: 13 %
Neutro Abs: 7.1 10*3/uL (ref 1.7–7.7)
Neutrophils Relative %: 74 %
Platelets: 147 10*3/uL — ABNORMAL LOW (ref 150–400)
RBC: 4.67 MIL/uL (ref 4.22–5.81)
RDW: 14.3 % (ref 11.5–15.5)
WBC: 9.6 10*3/uL (ref 4.0–10.5)
nRBC: 0 % (ref 0.0–0.2)

## 2021-06-07 LAB — COMPREHENSIVE METABOLIC PANEL
ALT: 20 U/L (ref 0–44)
AST: 81 U/L — ABNORMAL HIGH (ref 15–41)
Albumin: 3.7 g/dL (ref 3.5–5.0)
Alkaline Phosphatase: 46 U/L (ref 38–126)
Anion gap: 9 (ref 5–15)
BUN: 18 mg/dL (ref 6–20)
CO2: 24 mmol/L (ref 22–32)
Calcium: 9.3 mg/dL (ref 8.9–10.3)
Chloride: 107 mmol/L (ref 98–111)
Creatinine, Ser: 1.51 mg/dL — ABNORMAL HIGH (ref 0.61–1.24)
GFR, Estimated: 55 mL/min — ABNORMAL LOW (ref >60)
Glucose, Bld: 119 mg/dL — ABNORMAL HIGH (ref 70–99)
Potassium: 4 mmol/L (ref 3.5–5.1)
Sodium: 140 mmol/L (ref 135–145)
Total Bilirubin: 1.3 mg/dL — ABNORMAL HIGH (ref 0.3–1.2)
Total Protein: 6.8 g/dL (ref 6.5–8.1)

## 2021-06-07 LAB — GLUCOSE, CAPILLARY
Glucose-Capillary: 82 mg/dL (ref 70–99)
Glucose-Capillary: 113 mg/dL — ABNORMAL HIGH (ref 70–99)

## 2021-06-07 LAB — POCT I-STAT 7, (LYTES, BLD GAS, ICA,H+H)
Acid-base deficit: 2 mmol/L (ref 0.0–2.0)
Bicarbonate: 23.3 mmol/L (ref 20.0–28.0)
Calcium, Ion: 1.21 mmol/L (ref 1.15–1.40)
HCT: 36 % — ABNORMAL LOW (ref 39.0–52.0)
Hemoglobin: 12.2 g/dL — ABNORMAL LOW (ref 13.0–17.0)
O2 Saturation: 100 %
Patient temperature: 98.5
Potassium: 3.6 mmol/L (ref 3.5–5.1)
Sodium: 140 mmol/L (ref 135–145)
TCO2: 25 mmol/L (ref 22–32)
pCO2 arterial: 41.5 mmHg (ref 32–48)
pH, Arterial: 7.357 (ref 7.35–7.45)
pO2, Arterial: 205 mmHg — ABNORMAL HIGH (ref 83–108)

## 2021-06-07 LAB — CBG MONITORING, ED: Glucose-Capillary: 121 mg/dL — ABNORMAL HIGH (ref 70–99)

## 2021-06-07 LAB — SARS CORONAVIRUS 2 BY RT PCR: SARS Coronavirus 2 by RT PCR: NEGATIVE

## 2021-06-07 LAB — TROPONIN I (HIGH SENSITIVITY)
Troponin I (High Sensitivity): 13664 ng/L (ref <18)
Troponin I (High Sensitivity): 12571 ng/L (ref <18)

## 2021-06-07 LAB — POCT ACTIVATED CLOTTING TIME: Activated Clotting Time: 161 seconds

## 2021-06-07 LAB — MAGNESIUM: Magnesium: 2.2 mg/dL (ref 1.7–2.4)

## 2021-06-07 SURGERY — IR WITH ANESTHESIA
Anesthesia: General

## 2021-06-07 SURGERY — LEFT HEART CATH AND CORONARY ANGIOGRAPHY
Anesthesia: LOCAL

## 2021-06-07 MED ORDER — PROPOFOL 10 MG/ML IV BOLUS
INTRAVENOUS | Status: DC | PRN
  Administered 2021-06-07: 150 mg via INTRAVENOUS
  Administered 2021-06-07: 50 mg via INTRAVENOUS

## 2021-06-07 MED ORDER — FENTANYL CITRATE (PF) 100 MCG/2ML IJ SOLN
INTRAMUSCULAR | Status: AC
  Filled 2021-06-07: qty 2

## 2021-06-07 MED ORDER — LIDOCAINE HCL (PF) 1 % IJ SOLN
INTRAMUSCULAR | Status: AC
  Filled 2021-06-07: qty 30

## 2021-06-07 MED ORDER — HEPARIN (PORCINE) IN NACL 1000-0.9 UT/500ML-% IV SOLN
INTRAVENOUS | Status: AC
  Filled 2021-06-07: qty 1000

## 2021-06-07 MED ORDER — ACETAMINOPHEN 160 MG/5ML PO SOLN
650.0000 mg | ORAL | Status: DC | PRN
  Administered 2021-06-08 – 2021-06-13 (×2): 650 mg
  Filled 2021-06-07 (×3): qty 20.3

## 2021-06-07 MED ORDER — PHENYLEPHRINE HCL-NACL 20-0.9 MG/250ML-% IV SOLN
INTRAVENOUS | Status: DC | PRN
  Administered 2021-06-07: 75 ug/min via INTRAVENOUS

## 2021-06-07 MED ORDER — MIDAZOLAM HCL 2 MG/2ML IJ SOLN
INTRAMUSCULAR | Status: AC
  Administered 2021-06-07: 1 mg via INTRAVENOUS
  Filled 2021-06-07: qty 2

## 2021-06-07 MED ORDER — IOHEXOL 300 MG/ML  SOLN
100.0000 mL | Freq: Once | INTRAMUSCULAR | Status: AC | PRN
  Administered 2021-06-07: 21 mL via INTRA_ARTERIAL

## 2021-06-07 MED ORDER — DEXTROSE 5 % IV SOLN
5.0000 mg | Freq: Once | INTRAVENOUS | Status: AC
  Administered 2021-06-07: 5 mg via INTRAVENOUS
  Filled 2021-06-07: qty 5

## 2021-06-07 MED ORDER — ATORVASTATIN CALCIUM 80 MG PO TABS
80.0000 mg | ORAL_TABLET | Freq: Every day | ORAL | Status: DC
  Administered 2021-06-07: 80 mg via ORAL
  Filled 2021-06-07: qty 2

## 2021-06-07 MED ORDER — ACETAMINOPHEN 325 MG PO TABS
650.0000 mg | ORAL_TABLET | ORAL | Status: DC | PRN
Start: 2021-06-07 — End: 2021-06-14

## 2021-06-07 MED ORDER — IOHEXOL 350 MG/ML SOLN
INTRAVENOUS | Status: DC | PRN
  Administered 2021-06-07 (×2): 90 mL

## 2021-06-07 MED ORDER — MIDAZOLAM HCL 2 MG/2ML IJ SOLN
INTRAMUSCULAR | Status: DC | PRN
  Administered 2021-06-07: 1 mg via INTRAVENOUS

## 2021-06-07 MED ORDER — DOPAMINE-DEXTROSE 3.2-5 MG/ML-% IV SOLN
0.0000 ug/kg/min | INTRAVENOUS | Status: DC
  Filled 2021-06-07: qty 250

## 2021-06-07 MED ORDER — ROCURONIUM BROMIDE 100 MG/10ML IV SOLN
INTRAVENOUS | Status: DC | PRN
  Administered 2021-06-07: 100 mg via INTRAVENOUS
  Administered 2021-06-07: 20 mg via INTRAVENOUS

## 2021-06-07 MED ORDER — LACTATED RINGERS IV SOLN: INTRAVENOUS | Status: DC | PRN

## 2021-06-07 MED ORDER — FENTANYL CITRATE (PF) 100 MCG/2ML IJ SOLN
INTRAMUSCULAR | Status: DC | PRN
  Administered 2021-06-07: 100 ug via INTRAVENOUS

## 2021-06-07 MED ORDER — CEFAZOLIN SODIUM-DEXTROSE 2-4 GM/100ML-% IV SOLN
INTRAVENOUS | Status: AC
  Filled 2021-06-07: qty 100

## 2021-06-07 MED ORDER — PROPOFOL 500 MG/50ML IV EMUL
INTRAVENOUS | Status: DC | PRN
  Administered 2021-06-07: 25 ug/kg/min via INTRAVENOUS

## 2021-06-07 MED ORDER — SODIUM CHLORIDE 0.9% FLUSH
3.0000 mL | Freq: Two times a day (BID) | INTRAVENOUS | Status: DC
  Administered 2021-06-07 – 2021-06-14 (×11): 3 mL via INTRAVENOUS

## 2021-06-07 MED ORDER — SODIUM CHLORIDE 0.9 % IV SOLN: INTRAVENOUS | Status: DC

## 2021-06-07 MED ORDER — CEFAZOLIN SODIUM-DEXTROSE 2-3 GM-%(50ML) IV SOLR
INTRAVENOUS | Status: DC | PRN
  Administered 2021-06-07: 2 g via INTRAVENOUS

## 2021-06-07 MED ORDER — ASPIRIN 300 MG RE SUPP: 300.0000 mg | RECTAL | Status: DC

## 2021-06-07 MED ORDER — ACETAMINOPHEN 325 MG PO TABS: 650.0000 mg | ORAL_TABLET | ORAL | Status: DC | PRN

## 2021-06-07 MED ORDER — SODIUM CHLORIDE 0.9% FLUSH
3.0000 mL | Freq: Two times a day (BID) | INTRAVENOUS | Status: DC
  Administered 2021-06-07 – 2021-06-08 (×2): 3 mL via INTRAVENOUS

## 2021-06-07 MED ORDER — IOHEXOL 300 MG/ML  SOLN
100.0000 mL | Freq: Once | INTRAMUSCULAR | Status: AC | PRN
  Administered 2021-06-07: 45 mL via INTRA_ARTERIAL

## 2021-06-07 MED ORDER — EMPAGLIFLOZIN 10 MG PO TABS
10.0000 mg | ORAL_TABLET | Freq: Every day | ORAL | Status: DC
  Administered 2021-06-07: 10 mg via ORAL
  Filled 2021-06-07 (×2): qty 1

## 2021-06-07 MED ORDER — SODIUM CHLORIDE 0.9 % IV SOLN
250.0000 mL | INTRAVENOUS | Status: DC | PRN
  Administered 2021-06-08: 250 mL via INTRAVENOUS

## 2021-06-07 MED ORDER — HEPARIN (PORCINE) 25000 UT/250ML-% IV SOLN
1300.0000 [IU]/h | INTRAVENOUS | Status: DC
  Administered 2021-06-07: 1300 [IU]/h via INTRAVENOUS
  Filled 2021-06-07: qty 250

## 2021-06-07 MED ORDER — ACETAMINOPHEN 650 MG RE SUPP: 650.0000 mg | RECTAL | Status: DC | PRN

## 2021-06-07 MED ORDER — ASCORBIC ACID 500 MG PO TABS
500.0000 mg | ORAL_TABLET | Freq: Every day | ORAL | Status: DC
  Administered 2021-06-07: 500 mg via ORAL
  Filled 2021-06-07: qty 1

## 2021-06-07 MED ORDER — HEPARIN BOLUS VIA INFUSION
4000.0000 [IU] | Freq: Once | INTRAVENOUS | Status: AC
  Administered 2021-06-07: 4000 [IU] via INTRAVENOUS
  Filled 2021-06-07: qty 4000

## 2021-06-07 MED ORDER — LIDOCAINE HCL (PF) 1 % IJ SOLN
INTRAMUSCULAR | Status: DC | PRN
  Administered 2021-06-07: 10 mL

## 2021-06-07 MED ORDER — NITROGLYCERIN 0.4 MG SL SUBL: 0.4000 mg | SUBLINGUAL_TABLET | SUBLINGUAL | Status: DC | PRN

## 2021-06-07 MED ORDER — LOSARTAN POTASSIUM 25 MG PO TABS
25.0000 mg | ORAL_TABLET | Freq: Every day | ORAL | Status: DC
  Administered 2021-06-07: 25 mg via ORAL
  Filled 2021-06-07: qty 1

## 2021-06-07 MED ORDER — FENTANYL CITRATE PF 50 MCG/ML IJ SOSY
50.0000 ug | PREFILLED_SYRINGE | INTRAMUSCULAR | Status: DC | PRN
  Administered 2021-06-08 – 2021-06-09 (×2): 50 ug via INTRAVENOUS
  Filled 2021-06-07 (×2): qty 1

## 2021-06-07 MED ORDER — ONDANSETRON HCL 4 MG/2ML IJ SOLN: 4.0000 mg | Freq: Four times a day (QID) | INTRAMUSCULAR | Status: DC | PRN

## 2021-06-07 MED ORDER — ASPIRIN 81 MG PO CHEW: 324.0000 mg | CHEWABLE_TABLET | ORAL | Status: DC

## 2021-06-07 MED ORDER — SODIUM CHLORIDE 0.9 % IV SOLN: 250.0000 mL | INTRAVENOUS | Status: DC

## 2021-06-07 MED ORDER — LIDOCAINE HCL (CARDIAC) PF 50 MG/5ML IV SOSY
PREFILLED_SYRINGE | INTRAVENOUS | Status: DC | PRN
  Administered 2021-06-07: 40 mg via INTRAVENOUS

## 2021-06-07 MED ORDER — ASPIRIN 325 MG PO TABS
325.0000 mg | ORAL_TABLET | Freq: Every day | ORAL | Status: DC
  Administered 2021-06-07: 325 mg via ORAL
  Filled 2021-06-07: qty 1

## 2021-06-07 MED ORDER — LIDOCAINE HCL 1 % IJ SOLN
INTRAMUSCULAR | Status: AC
  Filled 2021-06-07: qty 20

## 2021-06-07 MED ORDER — SODIUM CHLORIDE 0.9% FLUSH: 3.0000 mL | INTRAVENOUS | Status: DC | PRN

## 2021-06-07 MED ORDER — MIDAZOLAM HCL 2 MG/2ML IJ SOLN
INTRAMUSCULAR | Status: AC
  Filled 2021-06-07: qty 2

## 2021-06-07 MED ORDER — IOHEXOL 350 MG/ML SOLN
60.0000 mL | Freq: Once | INTRAVENOUS | Status: AC | PRN
Start: 2021-06-07 — End: 2021-06-07
  Administered 2021-06-07: 60 mL via INTRAVENOUS

## 2021-06-07 MED ORDER — DOPAMINE-DEXTROSE 3.2-5 MG/ML-% IV SOLN
INTRAVENOUS | Status: DC | PRN
  Administered 2021-06-07: 5 ug/kg/min via INTRAVENOUS

## 2021-06-07 MED ORDER — PANTOPRAZOLE SODIUM 40 MG PO TBEC: 40.0000 mg | DELAYED_RELEASE_TABLET | Freq: Every day | ORAL | Status: DC

## 2021-06-07 MED ORDER — TICAGRELOR 90 MG PO TABS
90.0000 mg | ORAL_TABLET | Freq: Two times a day (BID) | ORAL | Status: DC
  Administered 2021-06-07 (×2): 90 mg via ORAL
  Filled 2021-06-07 (×2): qty 1

## 2021-06-07 MED ORDER — NITROGLYCERIN 1 MG/10 ML FOR IR/CATH LAB
INTRA_ARTERIAL | Status: AC
  Filled 2021-06-07: qty 10

## 2021-06-07 MED ORDER — INSULIN ASPART 100 UNIT/ML IJ SOLN: 0.0000 [IU] | Freq: Three times a day (TID) | INTRAMUSCULAR | Status: DC

## 2021-06-07 MED ORDER — SODIUM CHLORIDE 0.9 % IV SOLN: 250.0000 mL | INTRAVENOUS | Status: DC | PRN

## 2021-06-07 MED ORDER — PROPOFOL 1000 MG/100ML IV EMUL
5.0000 ug/kg/min | INTRAVENOUS | Status: DC
  Administered 2021-06-07: 60 ug/kg/min via INTRAVENOUS
  Administered 2021-06-08: 30 ug/kg/min via INTRAVENOUS
  Administered 2021-06-08: 60 ug/kg/min via INTRAVENOUS
  Filled 2021-06-07 (×3): qty 100

## 2021-06-07 MED ORDER — IOHEXOL 300 MG/ML  SOLN
100.0000 mL | Freq: Once | INTRAMUSCULAR | Status: AC | PRN
  Administered 2021-06-07: 90 mL via INTRA_ARTERIAL

## 2021-06-07 MED ORDER — PHENYLEPHRINE HCL-NACL 20-0.9 MG/250ML-% IV SOLN: 0.0000 ug/min | INTRAVENOUS | Status: DC

## 2021-06-07 MED ORDER — ASPIRIN EC 81 MG PO TBEC: 81.0000 mg | DELAYED_RELEASE_TABLET | Freq: Every day | ORAL | Status: DC

## 2021-06-07 MED ORDER — HEPARIN (PORCINE) IN NACL 1000-0.9 UT/500ML-% IV SOLN
INTRAVENOUS | Status: DC | PRN
  Administered 2021-06-07 (×4): 500 mL

## 2021-06-07 MED ORDER — CHLORHEXIDINE GLUCONATE 0.12% ORAL RINSE (MEDLINE KIT)
15.0000 mL | Freq: Two times a day (BID) | OROMUCOSAL | Status: DC
  Administered 2021-06-07: 15 mL via OROMUCOSAL

## 2021-06-07 MED ORDER — ORAL CARE MOUTH RINSE
15.0000 mL | OROMUCOSAL | Status: DC
  Administered 2021-06-08 (×4): 15 mL via OROMUCOSAL

## 2021-06-07 MED ORDER — INSULIN ASPART 100 UNIT/ML IJ SOLN: 0.0000 [IU] | Freq: Every day | INTRAMUSCULAR | Status: DC

## 2021-06-07 MED ORDER — CLEVIDIPINE BUTYRATE 0.5 MG/ML IV EMUL
0.0000 mg/h | INTRAVENOUS | Status: AC
  Administered 2021-06-08: 2.5 mg/h via INTRAVENOUS
  Administered 2021-06-08 (×2): 2 mg/h via INTRAVENOUS
  Administered 2021-06-08: 7 mg/h via INTRAVENOUS
  Filled 2021-06-07: qty 50
  Filled 2021-06-07: qty 100
  Filled 2021-06-07: qty 50

## 2021-06-07 MED ORDER — MIDAZOLAM HCL 2 MG/2ML IJ SOLN: 1.0000 mg | Freq: Once | INTRAMUSCULAR | Status: AC

## 2021-06-07 SURGICAL SUPPLY — 13 items
CABLE ADAPT PACING TEMP 12FT (ADAPTER) ×1 IMPLANT
CATH INFINITI 5 FR 3DRC (CATHETERS) ×1 IMPLANT
CATH INFINITI 5 FR AR1 MOD (CATHETERS) ×1 IMPLANT
CATH INFINITI 5FR MULTPACK ANG (CATHETERS) ×1 IMPLANT
CATH S G BIP PACING (CATHETERS) ×1 IMPLANT
KIT HEART LEFT (KITS) ×2 IMPLANT
PACK CARDIAC CATHETERIZATION (CUSTOM PROCEDURE TRAY) ×2 IMPLANT
SHEATH PINNACLE 5F 10CM (SHEATH) ×1 IMPLANT
SHEATH PINNACLE 6F 10CM (SHEATH) ×1 IMPLANT
SLEEVE REPOSITIONING LENGTH 30 (MISCELLANEOUS) ×1 IMPLANT
SYR MEDRAD MARK 7 150ML (SYRINGE) ×2 IMPLANT
TRANSDUCER W/STOPCOCK (MISCELLANEOUS) ×2 IMPLANT
WIRE EMERALD 3MM-J .035X150CM (WIRE) ×1 IMPLANT

## 2021-06-07 NOTE — Anesthesia Procedure Notes (Signed)
Arterial Line Insertion ?Start/End4/25/2023 6:55 PM, 06/07/2021 7:02 PM ?Performed by: Elliot Dally, CRNA ? Patient location: OR. ?Preanesthetic checklist: patient identified, IV checked, site marked, risks and benefits discussed, surgical consent, monitors and equipment checked, pre-op evaluation, timeout performed and anesthesia consent ?Lidocaine 1% used for infiltration and patient sedated ?Left, radial was placed ?Catheter size: 20 G ?Hand hygiene performed  and maximum sterile barriers used  ?Allen's test indicative of satisfactory collateral circulation ?Attempts: 1 ?Procedure performed without using ultrasound guided technique. ?Following insertion, dressing applied and Biopatch. ?Post procedure assessment: normal ? ?Patient tolerated the procedure well with no immediate complications. ? ? ?

## 2021-06-07 NOTE — Progress Notes (Signed)
eLink Physician-Brief Progress Note ?Patient Name: Henry Simmons ?DOB: 1971-01-05 ?MRN: [id number] ? ? ?Date of Service ? 06/07/2021  ?HPI/Events of Note ? KUB reviewed, NG tube needs to be advanced 10 cm.  ?eICU Interventions ? Nursing communication order entered to advance NG tube 10 cm.  ? ? ? ?  ? ?Henry Simmons ?06/07/2021, 11:20 PM ?
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
## 2021-06-07 NOTE — Progress Notes (Signed)
Dr. Algie Coffer was Called to the IR suite by the radiology physician regarding placing a temporary pacemaker prior to them starting an IR procedure. Dr. Algie Coffer informed Dorothyann Gibbs and myself that he would be placing the temporary pacemaker in the IR suite. Cath lab staff accompanied Dr. Algie Coffer to the IR suite and assisted him during the procedure with the insertion of the temporary pacemaker. A full pre procedure timeout was preformed with IR staff prior to beginning the procedure. 1mg  of Versed was administered by , RN under verbal order from Dr. Dorothyann Gibbs  and was documented in the [facility] as a one time medication. After temporary pacemaker was placed, wire and sheath were sutured in place. Temp pacemaker wire balloon was put in the deflated position and the syringe was locked. Cath lab staff informed IR staff and anesthesia about swan placement and post procedure management. Anesthesia was informed to call cardiology if any issues with the temp pacemaker occurred.  ?

## 2021-06-07 NOTE — CV Procedure (Signed)
Central Venous Catheter Insertion and temporary transvenous pacemaker Insertion Procedure Note ?Jesiel Garate Frees ?[id number] ?1970/04/06 ? ?Procedure: Insertion of Central Venous Catheter/Transvenous temporary pacemaker insertion ? ?Indications:  Bradycardia with 3rd degree heart block. ? ?Procedure Details ?Consent: Risks of procedure as well as the alternatives and risks of each were explained to the (patient/caregiver).  Consent for procedure obtained. ? ?Time Out: Verified patient identification, verified procedure, site/side was marked, verified correct patient position, special equipment/implants available, medications/allergies/relevent history reviewed, required imaging and test results available.  Performed ? ?Maximum sterile technique was used including antiseptics, cap, gloves, gown, hand hygiene, mask, and sheet. ?Skin prep: Iodine solution; local anesthetic administered ? ?A 6 french short sheath was placed under ultrasound guidance and fluoroscopic guidance in the right femoral vein due to emergent situation using the Seldinger technique. ? ?A 5 Fr balloon tipped pacer wire was positioned in RV under fluoroscopic guidance, threshold were checked down to 1 mA and final setting was 70 HR and 5 mA at 70 cm. Length. A Sheath was placed over the wire and sutured.  ? ?Evaluation ?Blood flow good ?Complications: No apparent complications ?Patient did tolerate procedure well. ? ?IR doctor to take over care followed by neurology and CCM care for ICU placement if needed. ? ?Ricki Rodriguez ?06/07/2021, 7:12 PM ?  ?

## 2021-06-07 NOTE — Progress Notes (Signed)
RT transported pt from IR to [facility] room 2 without complications. ?

## 2021-06-07 NOTE — Progress Notes (Signed)
Patient oriented to room and floor. Urinal at bedside. Placed on telemetry. Patient c/o of shortness of breath, 2L of oxygen applied.  ?

## 2021-06-07 NOTE — Anesthesia Procedure Notes (Signed)
Procedure Name: Intubation ?Date/Time: 06/07/2021 7:00 PM ?Performed by: Suzy Bouchard, CRNA ?Pre-anesthesia Checklist: Patient identified, Emergency Drugs available, Suction available, Patient being monitored and Timeout performed ?Patient Re-evaluated:Patient Re-evaluated prior to induction ?Oxygen Delivery Method: Circle system utilized ?Preoxygenation: Pre-oxygenation with 100% oxygen ?Induction Type: IV induction and Rapid sequence ?Laryngoscope Size: Glidescope and 3 ?Grade View: Grade I ?Tube type: Oral ?Tube size: 7.5 mm ?Number of attempts: 1 ?Airway Equipment and Method: Stylet and Video-laryngoscopy ?Placement Confirmation: ETT inserted through vocal cords under direct vision, positive ETCO2 and breath sounds checked- equal and bilateral ?Secured at: 23 cm ?Tube secured with: Tape ?Dental Injury: Teeth and Oropharynx as per pre-operative assessment  ? ? ? ? ?

## 2021-06-07 NOTE — Sedation Documentation (Signed)
Anesthesia arrived to take over case after successful temporary pacer placement by Dr. Algie Coffer and Cath lab team. VSS. No s/s of distress at this time.  ?

## 2021-06-07 NOTE — Transfer of Care (Signed)
Immediate Anesthesia Transfer of Care Note ? ?Patient: Henry Simmons ? ?Procedure(s) Performed: IR WITH ANESTHESIA ? ?Patient Location: ICU ? ?Anesthesia Type:General ? ?Level of Consciousness: sedated and Patient remains intubated per anesthesia plan ? ?Airway & Oxygen Therapy: Patient remains intubated per anesthesia plan and Patient placed on Ventilator (see vital sign flow sheet for setting) ? ?Post-op Assessment: Report given to RN and Post -op Vital signs reviewed and stable ? ?Post vital signs: Reviewed and stable ? ?Last Vitals:  ?Vitals Value Taken Time  ?BP 123/93 06/07/21 2100  ?Temp    ?Pulse 69 06/07/21 2109  ?Resp 17 06/07/21 2109  ?SpO2 100 % 06/07/21 2109  ?Vitals shown include unvalidated device data. ? ?Last Pain:  ?Vitals:  ? 06/07/21 1600  ?TempSrc:   ?PainSc: 0-No pain  ?   ? ?  ? ?Complications: No notable events documented. ?

## 2021-06-07 NOTE — Progress Notes (Signed)
Patient taken down to cath lab. Right groin clip per order. Patient has been NPO. Consent sign. ?

## 2021-06-07 NOTE — Procedures (Signed)
INR. ?Four-vessel cerebral arteriogram. ?Right CFA approach. ?Superselective micro injections of the left middle cerebral artery branches. ? ?Findings ?1 .  Extensive moderate to severe diffuse intracranial arteriosclerotic disease involving the middle cerebral arteries the anterior cerebral arteries and the posterior circulation ?2.  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  Severe proximal basilar artery stenoses. ?4 approximately 50 to 70% stenosis of the left internal carotid artery supraclinoid segment. ?5.  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 ?6.  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 ?7.  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 ?8 French Angio-Seal closure device applied for hemostasis at the right groin arterial puncture site.  Distal pulses all dopplerable. ?Patient left intubated due to patient's medical condition. ? ?Fatima Sanger MD ?

## 2021-06-07 NOTE — ED Notes (Signed)
Cardiologist at bedside.  
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
## 2021-06-07 NOTE — ED Provider Notes (Signed)
?[facility] EMERGENCY DEPARTMENT ?Provider Note ? ? ?CSN: [id number] ?Arrival date & time: 06/07/21  7782 ? ?  ? ?History ? ?Chief Complaint  ?Patient presents with  ? Bradycardia  ? ? ?Henry Simmons is a 51 y.o. male. ? ?Pt is a 51 yo male with a pmhx significant for CAD (last stent 10/22), htn, cva, ischemic cm, hyperlipidemia, and tobacco abuse.  He woke up this am around 0430 to go to work.  He noticed he was having trouble getting dressed.  He was unable to put his belt through the belt loops.  His wife took him to UC and UC called EMS.  When EMS arrived, pt was moving everything, but was intermittently confused.  He was unable to stand up to get on the stretcher for them, but was able to get up when he arrived here.  Pt's HR was noted to be as low as 28.  EMS gave pt 1 mg atropine which brought his HR up to the 40s.  Pt denies any vision changes, speech difficulties, or weakness or numbness.  He denies cp or sob.  Pt said he's been compliant with meds. ? ? ?  ? ?Home Medications ?Prior to Admission medications   ?Medication Sig Start Date End Date Taking? Authorizing Provider  ?amLODipine (NORVASC) 10 MG tablet Take 1 tablet (10 mg total) by mouth daily. 12/01/20 12/01/21  Corky Crafts, MD  ?aspirin 81 MG EC tablet Take 1 tablet (81 mg total) by mouth daily. Swallow whole. 12/02/20   Corky Crafts, MD  ?atorvastatin (LIPITOR) 80 MG tablet Take 1 tablet (80 mg total) by mouth daily. 12/02/20   Corky Crafts, MD  ?carvedilol (COREG) 25 MG tablet Take 1 tablet (25 mg total) by mouth 2 (two) times daily. 12/01/20   Corky Crafts, MD  ?empagliflozin (JARDIANCE) 10 MG TABS tablet Take 1 tablet (10 mg total) by mouth daily. 12/01/20   Corky Crafts, MD  ?Ferrous Sulfate (IRON PO) Take 1 tablet by mouth daily.    [provider]  ?losartan (COZAAR) 25 MG tablet Take 1 tablet (25 mg total) by mouth daily. 12/01/20   Corky Crafts, MD  ?nicotine  (NICODERM CQ - DOSED IN MG/24 HOURS) 14 mg/24hr patch Place 1 patch (14 mg total) onto the skin daily. 12/01/20 12/01/21  Corky Crafts, MD  ?nitroGLYCERIN (NITROSTAT) 0.4 MG SL tablet Place 1 tablet (0.4 mg total) under the tongue every 5 (five) minutes as needed for chest pain. 12/01/20   Corky Crafts, MD  ?ticagrelor (BRILINTA) 90 MG TABS tablet Take 1 tablet (90 mg total) by mouth 2 (two) times daily. 12/01/20   Corky Crafts, MD  ?Turmeric 500 MG CAPS Take 1 capsule by mouth daily.    [provider]  ?vitamin C (ASCORBIC ACID) 500 MG tablet Take 500 mg by mouth daily.    [provider]  ?   ? ?Allergies    ?Patient has no known allergies.   ? ?Review of Systems   ?Review of Systems  ?Psychiatric/Behavioral:  Positive for confusion.   ?All other systems reviewed and are negative. ? ?Physical Exam ?Updated Vital Signs ?BP (!) 152/85   Pulse (!) 42   Temp 99.2 ?F (37.3 ?C)   Resp 14   Ht 6\' 2"  (1.88 m)   Wt 104 kg   SpO2 99%   BMI 29.44 kg/m?  ?Physical Exam ?Vitals and nursing note reviewed.  ?Constitutional:   ?  Appearance: Normal appearance.  ?HENT:  ?   Head: Normocephalic and atraumatic.  ?   Right Ear: External ear normal.  ?   Left Ear: External ear normal.  ?   Nose: Nose normal.  ?   Mouth/Throat:  ?   Mouth: Mucous membranes are moist.  ?   Pharynx: Oropharynx is clear.  ?Eyes:  ?   Extraocular Movements: Extraocular movements intact.  ?   Conjunctiva/sclera: Conjunctivae normal.  ?   Pupils: Pupils are equal, round, and reactive to light.  ?Cardiovascular:  ?   Rate and Rhythm: Regular rhythm. Bradycardia present.  ?   Pulses: Normal pulses.  ?   Heart sounds: Normal heart sounds.  ?Pulmonary:  ?   Effort: Pulmonary effort is normal.  ?   Breath sounds: Normal breath sounds.  ?Abdominal:  ?   General: Abdomen is flat. Bowel sounds are normal.  ?   Palpations: Abdomen is soft.  ?Musculoskeletal:     ?   General: Normal range of motion.  ?   Cervical  back: Normal range of motion and neck supple.  ?Skin: ?   General: Skin is warm.  ?   Capillary Refill: Capillary refill takes less than 2 seconds.  ?Neurological:  ?   General: No focal deficit present.  ?   Mental Status: He is alert and oriented to person, place, and time.  ?Psychiatric:     ?   Mood and Affect: Mood normal.     ?   Behavior: Behavior normal.  ? ? ?ED Results / Procedures / Treatments   ?Labs ?(all labs ordered are listed, but only abnormal results are displayed) ?Labs Reviewed  ?CBC WITH DIFFERENTIAL/PLATELET - Abnormal; Notable for the following components:  ?    Result Value  ? Platelets 147 (*)   ? Monocytes Absolute 1.3 (*)   ? All other components within normal limits  ?COMPREHENSIVE METABOLIC PANEL - Abnormal; Notable for the following components:  ? Glucose, Bld 119 (*)   ? Creatinine, Ser 1.51 (*)   ? AST 81 (*)   ? Total Bilirubin 1.3 (*)   ? GFR, Estimated 55 (*)   ? All other components within normal limits  ?CBG MONITORING, ED - Abnormal; Notable for the following components:  ? Glucose-Capillary 121 (*)   ? All other components within normal limits  ?TROPONIN I (HIGH SENSITIVITY) - Abnormal; Notable for the following components:  ? Troponin I (High Sensitivity) 13,664 (*)   ? All other components within normal limits  ?MAGNESIUM  ?URINALYSIS, ROUTINE W REFLEX MICROSCOPIC  ?HEPARIN LEVEL (UNFRACTIONATED)  ?TROPONIN I (HIGH SENSITIVITY)  ? ? ?EKG ?EKG Interpretation ? ?Date/Time:  Tuesday June 07 2021 08:40:23 EDT ?Ventricular Rate:  46 ?PR Interval:  307 ?QRS Duration: 110 ?QT Interval:  513 ?QTC Calculation: 449 ?R Axis:   35 ?Text Interpretation: Sinus bradycardia Prolonged PR interval Probable left atrial enlargement LVH with secondary repolarization abnormality Inferior infarct, old Since last tracing rate slower Confirmed by Jacalyn Lefevre [phone]) on 06/07/2021 8:54:41 AM ? ?EKG from EMS (2nd degree hb type 2): ? ? ?Radiology ?CT Head Wo Contrast ? ?Result Date:  06/07/2021 ?CLINICAL DATA:  Altered mental status EXAM: CT HEAD WITHOUT CONTRAST TECHNIQUE: Contiguous axial images were obtained from the base of the skull through the vertex without intravenous contrast. RADIATION DOSE REDUCTION: This exam was performed according to the departmental dose-optimization program which includes automated exposure control, adjustment of the mA and/or kV according to patient size  and/or use of iterative reconstruction technique. COMPARISON:  CT head 10/24/2012 FINDINGS: Brain: No acute intracranial hemorrhage, mass effect, or herniation. No extra-axial fluid collections. No evidence of acute territorial infarct. No hydrocephalus. Patchy hypodensities throughout the periventricular and subcortical white matter, likely secondary to chronic microvascular ischemic changes. Small old left pericallosal infarct, small old infarct in the anterior left frontal lobe, and old bilateral basal ganglia lacunar infarcts. Tiny likely old infarct in the right cerebellum. Vascular: No hyperdense vessel or unexpected calcification. Skull: Normal. Negative for fracture or focal lesion. Sinuses/Orbits: Moderate mucosal thickening and opacification of the right sphenoid sinus. Other: None. IMPRESSION: No acute intracranial process identified. Multiple chronic findings as described. Electronically Signed   By: Jannifer Hickelaney  Williams M.D.   On: 06/07/2021 09:43  ? ?DG Chest Portable 1 View ? ?Result Date: 06/07/2021 ?CLINICAL DATA:  Altered mental status. EXAM: PORTABLE CHEST 1 VIEW COMPARISON:  November 29, 2020. FINDINGS: Stable cardiomegaly. Both lungs are clear. The visualized skeletal structures are unremarkable. IMPRESSION: No active disease. Electronically Signed   By: Lupita RaiderJames  Green Jr M.D.   On: 06/07/2021 09:11   ? ?Procedures ?Procedures  ? ? ?Medications Ordered in ED ?Medications  ?aspirin EC tablet 81 mg (has no administration in time range)  ?nitroGLYCERIN (NITROSTAT) SL tablet 0.4 mg (has no  administration in time range)  ?acetaminophen (TYLENOL) tablet 650 mg (has no administration in time range)  ?ondansetron (ZOFRAN) injection 4 mg (has no administration in time range)  ?sodium chloride flush (NS) 0.9 % injection 3 mL (3 mLs Int

## 2021-06-07 NOTE — ED Notes (Signed)
Pharmacy tech at bedside 

## 2021-06-07 NOTE — Anesthesia Preprocedure Evaluation (Addendum)
Anesthesia Evaluation  ?Patient identified by MRN, date of birth, ID band ?Patient awake ? ? ? ?Reviewed: ?Allergy & Precautions, H&P , NPO status , Patient's Chart, lab work & pertinent test results, Unable to perform ROS - Chart review only ? ?Airway ?Mallampati: II ? ? ?Neck ROM: full ? ? ? Dental ?  ?Pulmonary ?neg pulmonary ROS, former smoker,  ?  ?breath sounds clear to auscultation ? ? ? ? ? ? Cardiovascular ?hypertension, + Past MI  ? ?Rhythm:regular Rate:Normal ? ? ?  ?Neuro/Psych ?Seizures -,  CVA   ? GI/Hepatic ?negative GI ROS, Neg liver ROS,   ?Endo/Other  ?negative endocrine ROS ? Renal/GU ?negative Renal ROS  ? ?  ?Musculoskeletal ?negative musculoskeletal ROS ?(+)  ? Abdominal ?  ?Peds ? Hematology ?negative hematology ROS ?(+)   ?Anesthesia Other Findings ? ? Reproductive/Obstetrics ? ?  ? ? ? ? ? ? ? ? ? ? ? ? ? ?  ?  ? ? ? ? ? ? ? ?Anesthesia Physical ?Anesthesia Plan ? ?ASA: 4 and emergent ? ?Anesthesia Plan: General  ? ?Post-op Pain Management:   ? ?Induction: Intravenous ? ?PONV Risk Score and Plan: Ondansetron, Dexamethasone and Treatment may vary due to age or medical condition ? ?Airway Management Planned: Oral ETT ? ?Additional Equipment: Arterial line ? ?Intra-op Plan:  ? ?Post-operative Plan: Post-operative intubation/ventilation ? ?Informed Consent: I have reviewed the patients History and Physical, chart, labs and discussed the procedure including the risks, benefits and alternatives for the proposed anesthesia with the patient or authorized representative who has indicated his/her understanding and acceptance.  ? ? ? ?Dental advisory given ? ?Plan Discussed with: CRNA, Anesthesiologist and Surgeon ? ?Anesthesia Plan Comments:   ? ? ? ? ? ?Anesthesia Quick Evaluation ? ?

## 2021-06-07 NOTE — ED Notes (Signed)
CRITICAL VALUE STICKER ? ?CRITICAL VALUE:Trop 13,664 ? ?RECEIVER (on-site recipient of call):I. Aivah Putman ? ?DATE & TIME NOTIFIED: 06/07/21 1013 ? ?MESSENGER (representative from lab):Lab ? ?MD NOTIFIED: Particia Nearing ? ?TIME OF NOTIFICATION:1013 ? ?RESPONSE:  ? ?

## 2021-06-07 NOTE — Progress Notes (Signed)
Patient ID: Henry Simmons, male   DOB: Jun 02, 1970, 51 y.o.   MRN: [id number] ?INR. ?51 year old right-handed male.  Last seen well 8:30 PM last night ? ? ?Monitoring Rankin score of 1. ?Acute neurological changes noted during cardiac catheterization.  Patient noted to be aphasic with right-sided weakness and left gaze deviation. ?CT of the brain negative for intracranial hemorrhage.  Aspects questionably 9.  CT angiogram of the head and neck unsuccessful due to hypotension and bradycardia. ? ?MRI brain positive for diffusion-weighted restriction in the left.  Insular cortical and subcortical areas, with diffusion weighted restriction also noted in the right parietal cortical region.  MRA not obtained due to patient agitation. ?Diagnostic cath arteriogram with possible endovascular intervention discussed with the patient's family by Dr. Cordie Grice.  Procedure, reasons, risks, and alternatives were discussed.  Informed consent for the treatment was obtained from family.. ? ?Fatima Sanger MD ?
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
## 2021-06-07 NOTE — Progress Notes (Signed)
eLink Physician-Brief Progress Note ?Patient Name: Henry Simmons ?DOB: 1970/03/24 ?MRN: [id number] ? ? ?Date of Service ? 06/07/2021  ?HPI/Events of Note ? Patient has a Propofol infusion running without an order.  ?eICU Interventions ? Propofol gtt ordered.  ? ? ? ?  ? ?Thomasene Lot Milad Bublitz ?06/07/2021, 9:58 PM ?

## 2021-06-07 NOTE — Progress Notes (Signed)
eLink Physician-Brief Progress Note ?Patient Name: Henry Simmons ?DOB: 18-Sep-1970 ?MRN: [id number] ? ? ?Date of Service ? 06/07/2021  ?HPI/Events of Note ? Patient admitted with stroke-like symptoms and went to IR for a 4 vessel arteriogram to be followed by intervention, patient developed hypotension and bradycardia in IR,  patient is admitted to [facility] intubated and mechanically ventilated.  ?eICU Interventions ? New Patient Evaluation.  ? ? ? ?  ? ?Thomasene Lot Gary Bultman ?06/07/2021, 9:16 PM ?

## 2021-06-07 NOTE — ED Triage Notes (Signed)
PT arrives via EMS after going to be seen at [facility]. Pt woke up this morning around 0430 to go to work and he was having difficulty getting dressed. UC was concerned that patient was having a stroke. Upon ems arrival the patient had transient confusion and weakness. PT was found to be in a heart block with a rate of 28. 1 mg of atropine was administered. Pt denies cp or sob. Pt was able to stand and pivot from ems stretcher to ed stretcher.  ?
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
## 2021-06-07 NOTE — Progress Notes (Signed)
Pt arrived in IR holding bay at 1655 with Rapid Response. Pt taken to MRI as ordered by Dr. Estanislado Pandy at [phone].  ?

## 2021-06-07 NOTE — Code Documentation (Signed)
Stroke Response Nurse Documentation ?Code Documentation ? ?Henry Simmons is a 51 y.o. male who presented to [facility] on 06/07/21 with a hx of CAD, CVA. Came to the hospital today with confusion and weakness.  ? ?Patient from home where he was LKW on 06/06/21 at 2030 before bed, per his family. He was being seen by Cardiology- during his heart catheterization he was noted to be suddenly agitated, aphasic, and weak on the right side so code stroke was called.  ? ?Patient to CT with team. NIHSS 23, see documentation for details and code stroke times. Patient with left gaze preference , left hemianopia, right facial droop, right arm weakness, right leg weakness, Global aphasia , and Visual  neglect on exam. The following imaging was completed:  CT Head and CTA, MRI. Patient is not a candidate for IV Thrombolytic due to Heparin during procedure and conflicting last known well times. Patient is not a candidate for IR due to suspected LVO positive.  ? ?Care Plan: Patient transferred to IR with IR, [place], Cardiology, Cath lab teams. Temporary pacing wires to be places. Closely monitor following post-thrombectomy orders.  ? ?Bedside handoff with Rapid Response and IR RN's. ? ?Meda Klinefelter  ?Stroke Response RN ? ? ?

## 2021-06-08 ENCOUNTER — Inpatient Hospital Stay (HOSPITAL_COMMUNITY): Payer: 59

## 2021-06-08 ENCOUNTER — Encounter (HOSPITAL_COMMUNITY): Payer: Self-pay | Admitting: Cardiovascular Disease

## 2021-06-08 DIAGNOSIS — J9589 Other postprocedural complications and disorders of respiratory system, not elsewhere classified: Secondary | ICD-10-CM

## 2021-06-08 DIAGNOSIS — G460 Middle cerebral artery syndrome: Secondary | ICD-10-CM | POA: Diagnosis not present

## 2021-06-08 DIAGNOSIS — I441 Atrioventricular block, second degree: Secondary | ICD-10-CM | POA: Diagnosis not present

## 2021-06-08 DIAGNOSIS — I442 Atrioventricular block, complete: Secondary | ICD-10-CM | POA: Diagnosis not present

## 2021-06-08 DIAGNOSIS — I214 Non-ST elevation (NSTEMI) myocardial infarction: Secondary | ICD-10-CM | POA: Diagnosis not present

## 2021-06-08 LAB — LIPID PANEL
Cholesterol: 173 mg/dL (ref 0–200)
HDL: 21 mg/dL — ABNORMAL LOW (ref >40)
LDL Cholesterol: 118 mg/dL — ABNORMAL HIGH (ref 0–99)
Total CHOL/HDL Ratio: 8.2 RATIO
Triglycerides: 171 mg/dL — ABNORMAL HIGH (ref <150)
VLDL: 34 mg/dL (ref 0–40)

## 2021-06-08 LAB — CBC WITH DIFFERENTIAL/PLATELET
Abs Immature Granulocytes: 0.05 10*3/uL (ref 0.00–0.07)
Basophils Absolute: 0 10*3/uL (ref 0.0–0.1)
Basophils Relative: 0 %
Eosinophils Absolute: 0 10*3/uL (ref 0.0–0.5)
Eosinophils Relative: 0 %
HCT: 36.1 % — ABNORMAL LOW (ref 39.0–52.0)
Hemoglobin: 11.6 g/dL — ABNORMAL LOW (ref 13.0–17.0)
Immature Granulocytes: 1 %
Lymphocytes Relative: 7 %
Lymphs Abs: 0.7 10*3/uL (ref 0.7–4.0)
MCH: 28.2 pg (ref 26.0–34.0)
MCHC: 32.1 g/dL (ref 30.0–36.0)
MCV: 87.6 fL (ref 80.0–100.0)
Monocytes Absolute: 0.9 10*3/uL (ref 0.1–1.0)
Monocytes Relative: 9 %
Neutro Abs: 8.4 10*3/uL — ABNORMAL HIGH (ref 1.7–7.7)
Neutrophils Relative %: 83 %
Platelets: 149 10*3/uL — ABNORMAL LOW (ref 150–400)
RBC: 4.12 MIL/uL — ABNORMAL LOW (ref 4.22–5.81)
RDW: 14.2 % (ref 11.5–15.5)
WBC: 10.1 10*3/uL (ref 4.0–10.5)
nRBC: 0 % (ref 0.0–0.2)

## 2021-06-08 LAB — HEMOGLOBIN A1C
Hgb A1c MFr Bld: 5.3 % (ref 4.8–5.6)
Mean Plasma Glucose: 105.41 mg/dL

## 2021-06-08 LAB — BASIC METABOLIC PANEL
Anion gap: 10 (ref 5–15)
BUN: 19 mg/dL (ref 6–20)
CO2: 21 mmol/L — ABNORMAL LOW (ref 22–32)
Calcium: 8.9 mg/dL (ref 8.9–10.3)
Chloride: 109 mmol/L (ref 98–111)
Creatinine, Ser: 1.21 mg/dL (ref 0.61–1.24)
GFR, Estimated: >60 mL/min (ref >60)
Glucose, Bld: 92 mg/dL (ref 70–99)
Potassium: 3.5 mmol/L (ref 3.5–5.1)
Sodium: 140 mmol/L (ref 135–145)

## 2021-06-08 LAB — MAGNESIUM: Magnesium: 2.2 mg/dL (ref 1.7–2.4)

## 2021-06-08 LAB — GLUCOSE, CAPILLARY
Glucose-Capillary: 89 mg/dL (ref 70–99)
Glucose-Capillary: 84 mg/dL (ref 70–99)
Glucose-Capillary: 83 mg/dL (ref 70–99)
Glucose-Capillary: 92 mg/dL (ref 70–99)
Glucose-Capillary: 120 mg/dL — ABNORMAL HIGH (ref 70–99)
Glucose-Capillary: 115 mg/dL — ABNORMAL HIGH (ref 70–99)

## 2021-06-08 LAB — PHOSPHORUS: Phosphorus: 2.8 mg/dL (ref 2.5–4.6)

## 2021-06-08 LAB — HEPARIN LEVEL (UNFRACTIONATED): Heparin Unfractionated: 0.29 IU/mL — ABNORMAL LOW (ref 0.30–0.70)

## 2021-06-08 LAB — MRSA NEXT GEN BY PCR, NASAL: MRSA by PCR Next Gen: NOT DETECTED

## 2021-06-08 LAB — TRIGLYCERIDES: Triglycerides: 175 mg/dL — ABNORMAL HIGH (ref <150)

## 2021-06-08 MED ORDER — DEXMEDETOMIDINE HCL IN NACL 400 MCG/100ML IV SOLN
0.4000 ug/kg/h | INTRAVENOUS | Status: DC
  Administered 2021-06-08: 0.7 ug/kg/h via INTRAVENOUS
  Filled 2021-06-08: qty 100

## 2021-06-08 MED ORDER — HEPARIN (PORCINE) 25000 UT/250ML-% IV SOLN
1600.0000 [IU]/h | INTRAVENOUS | Status: DC
  Administered 2021-06-09: 1450 [IU]/h via INTRAVENOUS
  Administered 2021-06-10: 1500 [IU]/h via INTRAVENOUS
  Administered 2021-06-11: 1600 [IU]/h via INTRAVENOUS
  Filled 2021-06-08 (×5): qty 250

## 2021-06-08 MED ORDER — CLEVIDIPINE BUTYRATE 0.5 MG/ML IV EMUL
0.0000 mg/h | INTRAVENOUS | Status: AC
  Administered 2021-06-09: 11 mg/h via INTRAVENOUS
  Administered 2021-06-09: 4 mg/h via INTRAVENOUS
  Administered 2021-06-09: 8 mg/h via INTRAVENOUS
  Administered 2021-06-09 (×2): 11 mg/h via INTRAVENOUS
  Filled 2021-06-08 (×7): qty 50

## 2021-06-08 MED ORDER — ATORVASTATIN CALCIUM 80 MG PO TABS
80.0000 mg | ORAL_TABLET | Freq: Every day | ORAL | Status: DC
  Administered 2021-06-08 – 2021-06-10 (×3): 80 mg
  Filled 2021-06-08 (×4): qty 1

## 2021-06-08 MED ORDER — ASPIRIN 81 MG PO CHEW
81.0000 mg | CHEWABLE_TABLET | Freq: Every day | ORAL | Status: DC
  Administered 2021-06-09 – 2021-06-10 (×2): 81 mg
  Filled 2021-06-08 (×4): qty 1

## 2021-06-08 MED ORDER — ORAL CARE MOUTH RINSE
15.0000 mL | Freq: Two times a day (BID) | OROMUCOSAL | Status: DC
  Administered 2021-06-09 – 2021-06-14 (×9): 15 mL via OROMUCOSAL

## 2021-06-08 MED ORDER — PANTOPRAZOLE 2 MG/ML SUSPENSION
40.0000 mg | Freq: Every day | ORAL | Status: DC
  Administered 2021-06-09 – 2021-06-10 (×2): 40 mg
  Filled 2021-06-08 (×4): qty 20

## 2021-06-08 MED ORDER — POTASSIUM CHLORIDE 20 MEQ PO PACK
40.0000 meq | PACK | Freq: Once | ORAL | Status: DC
  Filled 2021-06-08: qty 2

## 2021-06-08 MED ORDER — SODIUM CHLORIDE 0.9% FLUSH: 3.0000 mL | INTRAVENOUS | Status: DC | PRN

## 2021-06-08 MED ORDER — PROSOURCE TF PO LIQD
45.0000 mL | Freq: Two times a day (BID) | ORAL | Status: DC
  Administered 2021-06-08 – 2021-06-13 (×7): 45 mL
  Filled 2021-06-08 (×9): qty 45

## 2021-06-08 MED ORDER — INSULIN ASPART 100 UNIT/ML IJ SOLN: 0.0000 [IU] | INTRAMUSCULAR | Status: DC

## 2021-06-08 MED ORDER — ASCORBIC ACID 500 MG PO TABS
500.0000 mg | ORAL_TABLET | Freq: Every day | ORAL | Status: DC
Start: 2021-06-08 — End: 2021-06-11
  Administered 2021-06-09 – 2021-06-10 (×2): 500 mg
  Filled 2021-06-08 (×4): qty 1

## 2021-06-08 MED ORDER — SODIUM CHLORIDE 0.9 % IV SOLN: 250.0000 mL | INTRAVENOUS | Status: DC | PRN

## 2021-06-08 MED ORDER — AMLODIPINE BESYLATE 5 MG PO TABS
5.0000 mg | ORAL_TABLET | Freq: Every day | ORAL | Status: DC
  Filled 2021-06-08: qty 1

## 2021-06-08 MED ORDER — CHLORHEXIDINE GLUCONATE 0.12 % MT SOLN
15.0000 mL | Freq: Two times a day (BID) | OROMUCOSAL | Status: DC
  Administered 2021-06-08 – 2021-06-14 (×12): 15 mL via OROMUCOSAL
  Filled 2021-06-08 (×12): qty 15

## 2021-06-08 MED ORDER — SODIUM CHLORIDE 0.9% FLUSH: 3.0000 mL | Freq: Two times a day (BID) | INTRAVENOUS | Status: DC

## 2021-06-08 MED ORDER — CHLORHEXIDINE GLUCONATE CLOTH 2 % EX PADS
6.0000 | MEDICATED_PAD | Freq: Every day | CUTANEOUS | Status: DC
  Administered 2021-06-08 – 2021-06-13 (×6): 6 via TOPICAL

## 2021-06-08 MED ORDER — OSMOLITE 1.5 CAL PO LIQD
1000.0000 mL | ORAL | Status: DC
  Administered 2021-06-08: 1000 mL

## 2021-06-08 MED ORDER — LOSARTAN POTASSIUM 50 MG PO TABS: 50.0000 mg | ORAL_TABLET | Freq: Every day | ORAL | Status: DC

## 2021-06-08 MED ORDER — HEPARIN (PORCINE) 25000 UT/250ML-% IV SOLN
INTRAVENOUS | Status: AC
  Administered 2021-06-08: 1400 [IU]/h via INTRAVENOUS
  Filled 2021-06-08: qty 250

## 2021-06-08 MED ORDER — SODIUM CHLORIDE 0.9 % IV SOLN: INTRAVENOUS | Status: AC

## 2021-06-08 MED ORDER — TICAGRELOR 90 MG PO TABS
90.0000 mg | ORAL_TABLET | Freq: Two times a day (BID) | ORAL | Status: DC
  Filled 2021-06-08: qty 1

## 2021-06-08 NOTE — Procedures (Signed)
Extubation Procedure Note ? ?Patient Details:   ?Name: Henry Simmons ?DOB: 1970-11-20 ?MRN: [id number] ?  ?Airway Documentation:  ?  ?Vent end date: 06/08/21 Vent end time: 1032  ? ?Evaluation ? O2 sats: stable throughout ?Complications: No apparent complications ?Patient did tolerate procedure well. ?Bilateral Breath Sounds: Clear, Diminished ?  ?Unable to voice due to stroke, but did try to voice and produced some sounds.  ? ?Patient was extubated to a 4L [place] without any complications, dyspnea or stridor noted.  ?Positive cuff leak prior to extubation.  ? ?Carlynn Spry ?06/08/2021, 10:32 AM ? ?

## 2021-06-08 NOTE — Progress Notes (Signed)
[facility] ADULT ICU REPLACEMENT PROTOCOL ? ? ?The patient does apply for the [facility] Adult ICU Electrolyte Replacment Protocol based on the criteria listed below:  ? ?1.Exclusion criteria: TCTS patients, ECMO patients, and Dialysis patients ?2. Is GFR >/= 30 ml/min? Yes.    ?Patient's GFR today is >60 ?3. Is SCr </= 2? Yes.   ?Patient's SCr is 1.21 mg/dL ?4. Did SCr increase >/= 0.5 in 24 hours? No. ?5.Pt's weight >40kg  Yes.   ?6. Abnormal electrolyte(s):   K 3.5  ?7. Electrolytes replaced per protocol ?8.  Call MD STAT for K+ </= 2.5, Phos </= 1, or Mag </= 1 ?Physician:  Lona Kettle ? ?Wilburt Finlay 06/08/2021 6:51 AM ? ?

## 2021-06-08 NOTE — Progress Notes (Signed)
Ref: Dixie Dials, MD ? ? ?Subjective:  ?Sedated and intubated. VS stable with transvenous pacing. He is pacer dependent and off B-blocker for 24 hours. ?Renal function stable post procedures. ?He had stroke symptoms prior to cardiac catheterization and post procedure neurological evaluation was without acute intervention due to diffuse disease of intracranial vessels. ? ?Objective:  ?Vital Signs in the last 24 hours: ?Temp:  [99 ?F (37.2 ?C)-100.7 ?F (38.2 ?C)] 100.7 ?F (38.2 ?C) (04/26 0800) ?Pulse Rate:  [31-71] 69 (04/26 0800) ?Cardiac Rhythm: Ventricular paced (04/26 0400) ?Resp:  [12-21] 17 (04/26 0800) ?BP: (95-170)/(59-115) 127/87 (04/26 0800) ?SpO2:  [95 %-100 %] 100 % (04/26 0800) ?Arterial Line BP: (106-190)/(59-87) 162/72 (04/26 0800) ?FiO2 (%):  [40 %-60 %] 40 % (04/26 0732) ?Weight:  [104 kg] 104 kg (04/25 0842) ? ?Physical Exam: ?BP Readings from Last 1 Encounters:  ?06/08/21 127/87  ?   ?Wt Readings from Last 1 Encounters:  ?06/07/21 104 kg  ?  Weight change:  Body mass index is 29.44 kg/m?. ?HEENT: [place]/AT, Eyes-Brown, Conjunctiva-Pink, Sclera-Non-icteric ?Neck: No JVD, No bruit, Trachea midline. ?Lungs:  Clearing, Bilateral. ?Cardiac:  Regular rhythm, normal S1 and S2, no S3. II/VI systolic murmur. ?Abdomen:  Soft, non-tender. BS present. ?Extremities:  No edema present. No cyanosis. No clubbing. ?CNS: AxOx0. ?Skin: Warm and dry. ? ? ?Intake/Output from previous day: ?04/25 0701 - 04/26 0700 ?In: 3320.2 [I.V.:3320.2] ?Out: 950 [Urine:900; Blood:50] ? ? ? ?Lab Results: ?BMET ?   ?Component Value Date/Time  ? NA 140 06/08/2021 0501  ? NA 140 06/07/2021 2159  ? NA 140 06/07/2021 0903  ? K 3.5 06/08/2021 0501  ? K 3.6 06/07/2021 2159  ? K 4.0 06/07/2021 0903  ? CL 109 06/08/2021 0501  ? CL 107 06/07/2021 0903  ? CL 100 12/01/2020 0244  ? CO2 21 (L) 06/08/2021 0501  ? CO2 24 06/07/2021 0903  ? CO2 25 12/01/2020 0244  ? GLUCOSE 92 06/08/2021 0501  ? GLUCOSE 119 (H) 06/07/2021 9604  ? GLUCOSE 127 (H)  12/01/2020 0244  ? BUN 19 06/08/2021 0501  ? BUN 18 06/07/2021 0903  ? BUN 15 12/01/2020 0244  ? CREATININE 1.21 06/08/2021 0501  ? CREATININE 1.51 (H) 06/07/2021 5409  ? CREATININE 1.31 (H) 12/01/2020 0244  ? CALCIUM 8.9 06/08/2021 0501  ? CALCIUM 9.3 06/07/2021 0903  ? CALCIUM 8.5 (L) 12/01/2020 0244  ? GFRNONAA >60 06/08/2021 0501  ? GFRNONAA 55 (L) 06/07/2021 8119  ? GFRNONAA >60 12/01/2020 0244  ? GFRAA 79 (L) 10/25/2012 1478  ? GFRAA 86 (L) 10/24/2012 1954  ? GFRAA 90 (L) 10/24/2012 0957  ? ?CBC ?   ?Component Value Date/Time  ? WBC 10.1 06/08/2021 0501  ? RBC 4.12 (L) 06/08/2021 0501  ? HGB 11.6 (L) 06/08/2021 0501  ? HCT 36.1 (L) 06/08/2021 0501  ? PLT 149 (L) 06/08/2021 0501  ? MCV 87.6 06/08/2021 0501  ? MCH 28.2 06/08/2021 0501  ? MCHC 32.1 06/08/2021 0501  ? RDW 14.2 06/08/2021 0501  ? LYMPHSABS 0.7 06/08/2021 0501  ? MONOABS 0.9 06/08/2021 0501  ? EOSABS 0.0 06/08/2021 0501  ? BASOSABS 0.0 06/08/2021 0501  ? ?HEPATIC Function Panel ?Recent Labs  ?  11/29/20 ?1258 06/07/21 ?0903  ?PROT 7.7 6.8  ? ?HEMOGLOBIN A1C ?No components found for: HGA1C,  MPG ?CARDIAC ENZYMES ?Lab Results  ?Component Value Date  ? CKTOTAL 319 (H) 09/18/2010  ? CKMB 2.0 09/18/2010  ? TROPONINI <0.30 10/24/2012  ? TROPONINI <0.30 09/15/2012  ? TROPONINI <0.30  09/18/2010  ? ?BNP ?No results for input(s): PROBNP in the last 8760 hours. ?TSH ?Recent Labs  ?  11/30/20 ?1548  ?TSH 1.016  ? ?CHOLESTEROL ?Recent Labs  ?  11/30/20 ?0305 06/08/21 ?0501  ?CHOL 157 173  ? ? ?Scheduled Meds: ? amLODipine  5 mg Oral Daily  ? vitamin C  500 mg Per Tube Daily  ? aspirin  81 mg Per Tube Daily  ? atorvastatin  80 mg Per Tube Daily  ? chlorhexidine gluconate (MEDLINE KIT)  15 mL Mouth Rinse BID  ? Chlorhexidine Gluconate Cloth  6 each Topical Daily  ? insulin aspart  0-15 Units Subcutaneous TID WC  ? insulin aspart  0-5 Units Subcutaneous QHS  ? losartan  50 mg Oral Daily  ? mouth rinse  15 mL Mouth Rinse 10 times per day  ? pantoprazole sodium  40  mg Per Tube Daily  ? potassium chloride  40 mEq Per Tube Once  ? sodium chloride flush  3 mL Intravenous Q12H  ? sodium chloride flush  3 mL Intravenous Q12H  ? ticagrelor  90 mg Per Tube BID  ? ?Continuous Infusions: ? sodium chloride    ? sodium chloride    ? clevidipine 2 mg/hr (06/08/21 0810)  ? dexmedetomidine (PRECEDEX) IV infusion    ? DOPamine    ? propofol (DIPRIVAN) infusion 30 mcg/kg/min (06/08/21 0800)  ? ?PRN Meds:.sodium chloride, sodium chloride, acetaminophen **OR** acetaminophen (TYLENOL) oral liquid 160 mg/5 mL **OR** acetaminophen, fentaNYL (SUBLIMAZE) injection, nitroGLYCERIN, ondansetron (ZOFRAN) IV, sodium chloride flush, sodium chloride flush ? ?Assessment/Plan: ? NSTEMI ?Stroke with right hemiplegia ?3rd degree heart block ?CAD ?S/P LAD stent ?HTN ?HLD ?HCM ? ?Plan: ?Continue medical treatment. ?EP consult for Permanent pacemaker. ? ? ? LOS: 1 day  ? ?Time spent including chart review, lab review, examination, discussion with patient/Doctor/Family : 30 min ? ? ?Dixie Dials  MD  ?06/08/2021, 8:37 AM ? ? ? ? ?

## 2021-06-08 NOTE — Care Management (Signed)
?  Transition of Care (TOC) Screening Note ? ? ?Patient Details  ?Name: Henry Simmons ?Date of Birth: 1970/07/23 ? ? ?Transition of Care (TOC) CM/SW Contact:    ?Lawerance Sabal, RN ?Phone Number: ?06/08/2021, 10:33 AM ? ? ? ?Transition of Care Department [facility]) has reviewed patient and we will continue to monitor patient advancement through interdisciplinary progression rounds.  ?
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
## 2021-06-08 NOTE — Progress Notes (Signed)
eLink Physician-Brief Progress Note ?Patient Name: Henry Simmons ?DOB: 28-Nov-1970 ?MRN: [id number] ? ? ?Date of Service ? 06/08/2021  ?HPI/Events of Note ? Patient is on a Cleviprex gtt but the order has expired, he was supposed to be started on Norvasc today but is yet to receive a dose.  ?eICU Interventions ? Cleviprex order renewed for 24 hours.  ? ? ? ?  ? ?Kerry Kass Elvert Cumpton ?06/08/2021, 10:30 PM ?

## 2021-06-08 NOTE — Progress Notes (Addendum)
Brief Nutrition Note ? ?Consult received for enteral/tube feeding initiation and management. ? ?Adult Enteral Nutrition Protocol initiated. Full assessment to follow. ? ?Cortrak placed this afternoon. Abd xray pending. Once placement confirmed via xray, ok to begin TF ? 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 ?Body mass index is 29.44 kg/m?.  ? ?Labs: ? ?Recent Labs  ?Lab 06/07/21 ?0903 06/07/21 ?2159 06/08/21 ?0501  ?NA 140 140 140  ?K 4.0 3.6 3.5  ?CL 107  --  109  ?CO2 24  --  21*  ?BUN 18  --  19  ?CREATININE 1.51*  --  1.21  ?CALCIUM 9.3  --  8.9  ?MG 2.2  --   --   ?GLUCOSE 119*  --  92  ? ? ?Kerman Passey MS, RDN, LDN, CNSC ?Registered Dietitian III ?Clinical Nutrition ?RD Pager and On-Call Pager Number Located in [place]  ? ? ?
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
## 2021-06-08 NOTE — Progress Notes (Signed)
? ? ?Supervising Physician: Luanne Bras ? ?Patient Status:  [facility] - In-pt ? ?Chief Complaint: ? ?NSTEMI and neurological changes ? ?Subjective: ? ?Sedated, intubated, wife at bedside ? ?Allergies: ?Patient has no known allergies. ? ?Medications: ?Prior to Admission medications   ?Medication Sig Start Date End Date Taking? Authorizing Provider  ?aspirin 81 MG EC tablet Take 1 tablet (81 mg total) by mouth daily. Swallow whole. 12/02/20  Yes Jettie Booze, MD  ?amLODipine (NORVASC) 10 MG tablet Take 1 tablet (10 mg total) by mouth daily. 12/01/20 12/01/21  Jettie Booze, MD  ?atorvastatin (LIPITOR) 80 MG tablet Take 1 tablet (80 mg total) by mouth daily. 12/02/20   Jettie Booze, MD  ?carvedilol (COREG) 25 MG tablet Take 1 tablet (25 mg total) by mouth 2 (two) times daily. 12/01/20   Jettie Booze, MD  ?clopidogrel (PLAVIX) 75 MG tablet Take 75 mg by mouth daily. 04/25/21   [provider]  ?empagliflozin (JARDIANCE) 10 MG TABS tablet Take 1 tablet (10 mg total) by mouth daily. 12/01/20   Jettie Booze, MD  ?Ferrous Sulfate (IRON PO) Take 1 tablet by mouth daily.    [provider]  ?losartan (COZAAR) 25 MG tablet Take 1 tablet (25 mg total) by mouth daily. 12/01/20   Jettie Booze, MD  ?meclizine (ANTIVERT) 25 MG tablet Take 25 mg by mouth daily as needed for dizziness. 02/14/21   [provider]  ?nicotine (NICODERM CQ - DOSED IN MG/24 HOURS) 14 mg/24hr patch Place 1 patch (14 mg total) onto the skin daily. 12/01/20 12/01/21  Jettie Booze, MD  ?nitroGLYCERIN (NITROSTAT) 0.4 MG SL tablet Place 1 tablet (0.4 mg total) under the tongue every 5 (five) minutes as needed for chest pain. 12/01/20   Jettie Booze, MD  ?ticagrelor (BRILINTA) 90 MG TABS tablet Take 1 tablet (90 mg total) by mouth 2 (two) times daily. 12/01/20   Jettie Booze, MD  ?Turmeric 500 MG CAPS Take 1 capsule by mouth daily.    [provider]   ?vitamin C (ASCORBIC ACID) 500 MG tablet Take 500 mg by mouth daily.    [provider]  ? ? ? ?Vital Signs: ?BP 127/87 (BP Location: Right Arm)   Pulse 69   Temp (!) 100.7 ?F (38.2 ?C) (Oral)   Resp 17   Ht 6\' 2"  (1.88 m)   Wt 229 lb 4.5 oz (104 kg)   SpO2 100%   BMI 29.44 kg/m?  ? ?Physical Exam ?Vitals reviewed.  ?Constitutional:   ?   Interventions: He is sedated and intubated.  ?HENT:  ?   Head: Normocephalic and atraumatic.  ?Eyes:  ?   Comments: Leftward deviation of gaze  ?Cardiovascular:  ?   Pulses: Normal pulses.  ?   Comments: Paced ? ?Pulmonary:  ?   Effort: He is intubated.  ?Skin: ?   General: Skin is warm and dry.  ?Neurological:  ?   Comments: Squeezes left hand and moves left foot on command  ?Right femoral artery stick site soft without obvious bleeding or hematoma. ?Not moving right side. ? ?Imaging: ?CT Head Wo Contrast ? ?Result Date: 06/07/2021 ?CLINICAL DATA:  Altered mental status EXAM: CT HEAD WITHOUT CONTRAST TECHNIQUE: Contiguous axial images were obtained from the base of the skull through the vertex without intravenous contrast. RADIATION DOSE REDUCTION: This exam was performed according to the departmental dose-optimization program which includes automated exposure control, adjustment of the mA and/or kV according to  patient size and/or use of iterative reconstruction technique. COMPARISON:  CT head 10/24/2012 FINDINGS: Brain: No acute intracranial hemorrhage, mass effect, or herniation. No extra-axial fluid collections. No evidence of acute territorial infarct. No hydrocephalus. Patchy hypodensities throughout the periventricular and subcortical white matter, likely secondary to chronic microvascular ischemic changes. Small old left pericallosal infarct, small old infarct in the anterior left frontal lobe, and old bilateral basal ganglia lacunar infarcts. Tiny likely old infarct in the right cerebellum. Vascular: No hyperdense vessel or unexpected calcification. Skull:  Normal. Negative for fracture or focal lesion. Sinuses/Orbits: Moderate mucosal thickening and opacification of the right sphenoid sinus. Other: None. IMPRESSION: No acute intracranial process identified. Multiple chronic findings as described. Electronically Signed   By: Ofilia Neas M.D.   On: 06/07/2021 09:43  ? ?CARDIAC CATHETERIZATION ? ?Result Date: 06/07/2021 ?  Mid LM to Dist LM lesion is 25% stenosed.   Ramus lesion is 80% stenosed.   2nd Diag lesion is 20% stenosed.   Prox LAD lesion is 60% stenosed.   Dist LAD lesion is 80% stenosed.   1st Mrg lesion is 80% stenosed.   Previously placed Mid LAD stent (unknown type) is  widely patent.   The left ventricular ejection fraction is 35-45% by visual estimate. Stroke work up in progress. Per wife patient had stroke symptoms wax and wane since this morning prior to arrival to ER. Medical treatment for now.  ? ?DG CHEST PORT 1 VIEW ? ?Result Date: 06/07/2021 ?CLINICAL DATA:  Bradycardia EXAM: PORTABLE CHEST 1 VIEW COMPARISON:  06/07/2021 FINDINGS: Endotracheal tube 5 cm above the carina. NG tube in the stomach. Mild cardiomegaly. Pacer wire enters the heart from the IVC and passes into the right ventricle. Lungs clear. No effusions. No acute bony abnormality. IMPRESSION: Support devices as above. Cardiomegaly.  No active disease. Electronically Signed   By: Rolm Baptise M.D.   On: 06/07/2021 22:33  ? ?DG Chest Portable 1 View ? ?Result Date: 06/07/2021 ?CLINICAL DATA:  Altered mental status. EXAM: PORTABLE CHEST 1 VIEW COMPARISON:  November 29, 2020. FINDINGS: Stable cardiomegaly. Both lungs are clear. The visualized skeletal structures are unremarkable. IMPRESSION: No active disease. Electronically Signed   By: Marijo Conception M.D.   On: 06/07/2021 09:11  ? ?DG Abd Portable 1V ? ?Result Date: 06/07/2021 ?CLINICAL DATA:  OG tube placement EXAM: PORTABLE ABDOMEN - 1 VIEW COMPARISON:  None. FINDINGS: OG tube tip in the stomach with the side port in the distal  esophagus. Pacer wire in the right ventricle. Mild cardiomegaly. Nonobstructive bowel gas pattern. IMPRESSION: OG tube tip in the proximal stomach. Electronically Signed   By: Rolm Baptise M.D.   On: 06/07/2021 22:33  ? ?ECHOCARDIOGRAM COMPLETE ? ?Result Date: 06/07/2021 ?   ECHOCARDIOGRAM REPORT   Patient Name:   Henry Simmons Date of Exam: 06/07/2021 Medical Rec #:  [id number]      Height:       74.0 in Accession #:    [id number]     Weight:       229.3 lb Date of Birth:  May 15, 1970      BSA:          2.303 m? Patient Age:    51 years       BP:           114/67 mmHg Patient Gender: M              HR:  36 bpm. Exam Location:  Inpatient Procedure: 2D Echo, Cardiac Doppler and Color Doppler Indications:    Nstemi  History:        Patient has prior history of Echocardiogram examinations, most                 recent 11/29/2020. Risk Factors:Hypertension.  Sonographer:    Jefferey Pica Referring Phys: [place]  1. Left ventricular ejection fraction, by estimation, is 40 to 45%. The left ventricle has mildly decreased function. The left ventricle demonstrates regional wall motion abnormalities (see scoring diagram/findings for description). There is severe concentric left ventricular hypertrophy. Left ventricular diastolic parameters are consistent with Grade I diastolic dysfunction (impaired relaxation). There is severe hypokinesis of the left ventricular, entire inferior wall and inferoseptal wall.  2. Right ventricular systolic function is normal. The right ventricular size is normal. There is normal pulmonary artery systolic pressure.  3. Left atrial size was mildly dilated.  4. Right atrial size was mildly dilated.  5. The mitral valve is normal in structure. Moderate mitral valve regurgitation.  6. The aortic valve is tricuspid. Aortic valve regurgitation is not visualized.  7. The inferior vena cava is normal in size with <50% respiratory variability, suggesting right atrial pressure  of 8 mmHg. Conclusion(s)/Recommendation(s): Findings consistent with ischemic cardiomyopathy. Cardiac interventions. FINDINGS  Left Ventricle: Left ventricular ejection fraction, by estimation, is 40 to 45%. The left ve

## 2021-06-08 NOTE — Progress Notes (Signed)
ANTICOAGULATION CONSULT NOTE - Follow-up Consult ? ?Pharmacy Consult for heparin ?Indication: chest pain/ACS ? ?No Known Allergies ? ?Patient Measurements: ?Height: 6\' 2"  (188 cm) ?Weight: 104 kg (229 lb 4.5 oz) ?IBW/kg (Calculated) : 82.2 ?Heparin Dosing Weight: 103.1 kg  ? ?Vital Signs: ?Temp: 98.4 ?F (36.9 ?C) (04/26 1900) ?Temp Source: Axillary (04/26 1900) ?BP: 154/86 (04/26 2200) ?Pulse Rate: 69 (04/26 2300) ? ?Labs: ?Recent Labs  ?  06/07/21 ?0903 06/07/21 ?1100 06/07/21 ?2159 06/08/21 ?0501 06/08/21 ?2117  ?HGB 13.2  --  12.2* 11.6*  --   ?HCT 41.5  --  36.0* 36.1*  --   ?PLT 147*  --   --  149*  --   ?HEPARINUNFRC  --   --   --   --  0.29*  ?CREATININE 1.51*  --   --  1.21  --   ?TROPONINIHS 2118* 12,571*  --   --   --   ? ? ?Estimated Creatinine Clearance: 91.8 mL/min (by C-G formula based on SCr of 1.21 mg/dL). ? ? ?Assessment: ?43 YOM with NSTEMI and likely AIS. CBC stable. Will target a more conservative heparin level due to probable stroke.  ? ?Initial heparin level just slightly low at 0.29 on 1400 units/hr. ? ?Goal of Therapy:  ?Heparin level 0.3-0.5 units/mL ?Monitor platelets by anticoagulation protocol: Yes ?  ?Plan:  ?Increase heparin to 1,450 units/hr  ?Daily levels ?Continue to monitor H&H and platelets ? ? ? ?
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
## 2021-06-08 NOTE — Progress Notes (Addendum)
STROKE TEAM PROGRESS NOTE  ? ?INTERVAL HISTORY ?His mother is at the bedside.  Patient is weaning, plans to extubate today.  Temporary pacing wires in place, tentative plans for permanent pacemaker over the next couple days with cardiology.  Patient has a dense right hemiplegia, left gaze deviation and he is following commands with the left side. ?MRI scan of the brain is highly suboptimal and limited but does show what appears to be left insula and right parietal infarcts.  Emergent cerebral catheter angiogram performed by Dr. Estanislado Pandy shows extensive moderate to severe diffuse intracranial atherosclerotic disease involving middle cerebral, anterior cerebral arteries and posterior circulation.  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  There is occluded anterior parietal branch of the MCA region of the left MCA which is likely where he had stroke ?Vitals:  ? 06/08/21 0600 06/08/21 0630 06/08/21 0700 06/08/21 0732  ?BP: (!) 147/94  (!) 149/93 (!) 149/93  ?Pulse: 70 68 70 71  ?Resp: 16 16 16 15   ?Temp:      ?TempSrc:      ?SpO2: 100% 100% 100% 99%  ?Weight:      ?Height:      ? ?CBC:  ?Recent Labs  ?Lab 06/07/21 ?0903 06/07/21 ?2159 06/08/21 ?0501  ?WBC 9.6  --  10.1  ?NEUTROABS 7.1  --  8.4*  ?HGB 13.2 12.2* 11.6*  ?HCT 41.5 36.0* 36.1*  ?MCV 88.9  --  87.6  ?PLT 147*  --  149*  ? ?Basic Metabolic Panel:  ?Recent Labs  ?Lab 06/07/21 ?0903 06/07/21 ?2159 06/08/21 ?0501  ?NA 140 140 140  ?K 4.0 3.6 3.5  ?CL 107  --  109  ?CO2 24  --  21*  ?GLUCOSE 119*  --  92  ?BUN 18  --  19  ?CREATININE 1.51*  --  1.21  ?CALCIUM 9.3  --  8.9  ?MG 2.2  --   --   ? ?Lipid Panel:  ?Recent Labs  ?Lab 06/08/21 ?0501  ?CHOL 173  ?TRIG 171*  175*  ?HDL 21*  ?CHOLHDL 8.2  ?VLDL 34  ?LDLCALC 118*  ? ?HgbA1c:  ?Recent Labs  ?Lab 06/08/21 ?0501  ?HGBA1C 5.3  ? ?Urine Drug Screen: No results for input(s): LABOPIA, COCAINSCRNUR, LABBENZ, AMPHETMU,  THCU, LABBARB in the last 168 hours.  ?Alcohol Level No results for input(s): ETH in the last 168 hours. ? ?IMAGING past 24 hours ?CT Head Wo Contrast ? ?Result Date: 06/07/2021 ?CLINICAL DATA:  Altered mental status EXAM: CT HEAD WITHOUT CONTRAST TECHNIQUE: Contiguous axial images were obtained from the base of the skull through the vertex without intravenous contrast. RADIATION DOSE REDUCTION: This exam was performed according to the departmental dose-optimization program which includes automated exposure control, adjustment of the mA and/or kV according to patient size and/or use of iterative reconstruction technique. COMPARISON:  CT head 10/24/2012 FINDINGS: Brain: No acute intracranial hemorrhage, mass effect, or herniation. No extra-axial fluid collections. No evidence of acute territorial infarct. No hydrocephalus. Patchy hypodensities throughout the periventricular and subcortical white matter, likely secondary to chronic microvascular ischemic changes. Small old left pericallosal infarct, small old infarct in the anterior left frontal lobe, and old bilateral basal ganglia lacunar infarcts. Tiny likely old infarct in the right cerebellum. Vascular: No hyperdense vessel or unexpected calcification. Skull: Normal. Negative for fracture or focal lesion. Sinuses/Orbits: Moderate mucosal thickening and opacification of the right sphenoid sinus. Other:  None. IMPRESSION: No acute intracranial process identified. Multiple chronic findings as described. Electronically Signed   By: Ofilia Neas M.D.   On: 06/07/2021 09:43  ? ?CARDIAC CATHETERIZATION ? ?Result Date: 06/07/2021 ?  Mid LM to Dist LM lesion is 25% stenosed.   [place]amus lesion is 80% stenosed.   2nd Diag lesion is 20% stenosed.   Prox LAD lesion is 60% stenosed.   Dist LAD lesion is 80% stenosed.   1st Mrg lesion is 80% stenosed.   Previously placed Mid LAD stent (unknown type) is  widely patent.   The left ventricular ejection fraction is 35-45% by visual  estimate. Stroke work up in progress. Per wife patient had stroke symptoms wax and wane since this morning prior to arrival to ER. Medical treatment for now.  ? ?DG CHEST PORT 1 VIEW ? ?Result Date: 06/07/2021 ?CLINICAL DATA:  Bradycardia EXAM: PORTABLE CHEST 1 VIEW COMPARISON:  06/07/2021 FINDINGS: Endotracheal tube 5 cm above the carina. NG tube in the stomach. Mild cardiomegaly. Pacer wire enters the heart from the IVC and passes into the right ventricle. Lungs clear. No effusions. No acute bony abnormality. IMPRESSION: Support devices as above. Cardiomegaly.  No active disease. Electronically Signed   By: Rolm Baptise M.D.   On: 06/07/2021 22:33  ? ?DG Chest Portable 1 View ? ?Result Date: 06/07/2021 ?CLINICAL DATA:  Altered mental status. EXAM: PORTABLE CHEST 1 VIEW COMPARISON:  November 29, 2020. FINDINGS: Stable cardiomegaly. Both lungs are clear. The visualized skeletal structures are unremarkable. IMPRESSION: No active disease. Electronically Signed   By: Marijo Conception M.D.   On: 06/07/2021 09:11  ? ?DG Abd Portable 1V ? ?Result Date: 06/07/2021 ?CLINICAL DATA:  OG tube placement EXAM: PORTABLE ABDOMEN - 1 VIEW COMPARISON:  None. FINDINGS: OG tube tip in the stomach with the side port in the distal esophagus. Pacer wire in the right ventricle. Mild cardiomegaly. Nonobstructive bowel gas pattern. IMPRESSION: OG tube tip in the proximal stomach. Electronically Signed   By: Rolm Baptise M.D.   On: 06/07/2021 22:33  ? ?ECHOCARDIOGRAM COMPLETE ? ?Result Date: 06/07/2021 ?   ECHOCARDIOGRAM REPORT   Patient Name:   Henry Simmons Date of Exam: 06/07/2021 Medical Rec #:  [id number]      Height:       74.0 in Accession #:    [id number]     Weight:       229.3 lb Date of Birth:  18-Jan-1971      BSA:          2.303 m? Patient Age:    51 years       BP:           114/67 mmHg Patient Gender: M              HR:           36 bpm. Exam Location:  Inpatient Procedure: 2D Echo, Cardiac Doppler and Color Doppler Indications:     Nstemi  History:        Patient has prior history of Echocardiogram examinations, most                 recent 11/29/2020. Risk Factors:Hypertension.  Sonographer:    Jefferey Pica Referring Phys: Love  1. Left ventricular ejection fraction, by estimation, is 40 to 45%. The left ventricle has mildly decreased function. The left ventricle demonstrates regional wall motion abnormalities (see scoring diagram/findings for description). There is severe concentric left ventricular  hypertrophy. Left ventricular diastolic parameters are consistent with Grade I diastolic dysfunction (impaired relaxation). There is severe hypokinesis of the left ventricular, entire inferior wall and inferoseptal wall.  2. Right ventricular systolic function is normal. The right ventricular size is normal. There is normal pulmonary artery systolic pressure.  3. Left atrial size was mildly dilated.  4. Right atrial size was mildly dilated.  5. The mitral valve is normal in structure. Moderate mitral valve regurgitation.  6. The aortic valve is tricuspid. Aortic valve regurgitation is not visualized.  7. The inferior vena cava is normal in size with <50% respiratory variability, suggesting right atrial pressure of 8 mmHg. Conclusion(s)/Recommendation(s): Findings consistent with ischemic cardiomyopathy. Cardiac interventions. FINDINGS  Left Ventricle: Left ventricular ejection fraction, by estimation, is 40 to 45%. The left ventricle has mildly decreased function. The left ventricle demonstrates regional wall motion abnormalities. Severe hypokinesis of the left ventricular, entire inferior wall and inferoseptal wall. The left ventricular internal cavity size was normal in size. There is severe concentric left ventricular hypertrophy. Left ventricular diastolic parameters are consistent with Grade I diastolic dysfunction (impaired relaxation).  LV Wall Scoring: The entire inferior wall and posterior wall are  hypokinetic. The entire anterior wall, antero-lateral wall, entire septum, apical lateral segment, and apex are normal. Right Ventricle: The right ventricular size is normal. No increase in right ventricular wall thickn

## 2021-06-08 NOTE — Procedures (Signed)
Cortrak  Person Inserting Tube:  Bronsen Serano L, RD Tube Type:  Cortrak - 43 inches Tube Size:  10 Tube Location:  Right nare Secured by: Bridle Technique Used to Measure Tube Placement:  Marking at nare/corner of mouth Cortrak Secured At:  69 cm   Cortrak Tube Team Note:  Consult received to place a Cortrak feeding tube.   X-ray is required, abdominal x-ray has been ordered by the Cortrak team. Please confirm tube placement before using the Cortrak tube.   If the tube becomes dislodged please keep the tube and contact the Cortrak team at [email or web address] (password [email or web address]) for replacement.  If after hours and replacement cannot be delayed, place a NG tube and confirm placement with an abdominal x-ray.    Boniface Goffe RD, LDN Clinical Dietitian See AMiON for contact information.   

## 2021-06-09 ENCOUNTER — Inpatient Hospital Stay (HOSPITAL_COMMUNITY): Payer: 59

## 2021-06-09 DIAGNOSIS — G460 Middle cerebral artery syndrome: Secondary | ICD-10-CM | POA: Diagnosis not present

## 2021-06-09 DIAGNOSIS — I441 Atrioventricular block, second degree: Secondary | ICD-10-CM | POA: Diagnosis not present

## 2021-06-09 DIAGNOSIS — I214 Non-ST elevation (NSTEMI) myocardial infarction: Secondary | ICD-10-CM | POA: Diagnosis not present

## 2021-06-09 DIAGNOSIS — I6340 Cerebral infarction due to embolism of unspecified cerebral artery: Secondary | ICD-10-CM | POA: Insufficient documentation

## 2021-06-09 DIAGNOSIS — I442 Atrioventricular block, complete: Secondary | ICD-10-CM | POA: Diagnosis not present

## 2021-06-09 LAB — BASIC METABOLIC PANEL
Anion gap: 8 (ref 5–15)
BUN: 14 mg/dL (ref 6–20)
CO2: 23 mmol/L (ref 22–32)
Calcium: 8.7 mg/dL — ABNORMAL LOW (ref 8.9–10.3)
Chloride: 109 mmol/L (ref 98–111)
Creatinine, Ser: 1.04 mg/dL (ref 0.61–1.24)
GFR, Estimated: >60 mL/min (ref >60)
Glucose, Bld: 142 mg/dL — ABNORMAL HIGH (ref 70–99)
Potassium: 3.6 mmol/L (ref 3.5–5.1)
Sodium: 140 mmol/L (ref 135–145)

## 2021-06-09 LAB — CBC
HCT: 35.1 % — ABNORMAL LOW (ref 39.0–52.0)
Hemoglobin: 11.7 g/dL — ABNORMAL LOW (ref 13.0–17.0)
MCH: 28.7 pg (ref 26.0–34.0)
MCHC: 33.3 g/dL (ref 30.0–36.0)
MCV: 86.2 fL (ref 80.0–100.0)
Platelets: 130 10*3/uL — ABNORMAL LOW (ref 150–400)
RBC: 4.07 MIL/uL — ABNORMAL LOW (ref 4.22–5.81)
RDW: 14 % (ref 11.5–15.5)
WBC: 9.7 10*3/uL (ref 4.0–10.5)
nRBC: 0 % (ref 0.0–0.2)

## 2021-06-09 LAB — HEPARIN LEVEL (UNFRACTIONATED): Heparin Unfractionated: 0.32 IU/mL (ref 0.30–0.70)

## 2021-06-09 LAB — GLUCOSE, CAPILLARY
Glucose-Capillary: 141 mg/dL — ABNORMAL HIGH (ref 70–99)
Glucose-Capillary: 159 mg/dL — ABNORMAL HIGH (ref 70–99)
Glucose-Capillary: 137 mg/dL — ABNORMAL HIGH (ref 70–99)
Glucose-Capillary: 143 mg/dL — ABNORMAL HIGH (ref 70–99)
Glucose-Capillary: 149 mg/dL — ABNORMAL HIGH (ref 70–99)
Glucose-Capillary: 157 mg/dL — ABNORMAL HIGH (ref 70–99)

## 2021-06-09 LAB — PHOSPHORUS
Phosphorus: 2.2 mg/dL — ABNORMAL LOW (ref 2.5–4.6)
Phosphorus: 2.9 mg/dL (ref 2.5–4.6)

## 2021-06-09 LAB — MAGNESIUM
Magnesium: 2.3 mg/dL (ref 1.7–2.4)
Magnesium: 2.2 mg/dL (ref 1.7–2.4)

## 2021-06-09 MED ORDER — LOSARTAN POTASSIUM 50 MG PO TABS
100.0000 mg | ORAL_TABLET | Freq: Every day | ORAL | Status: DC
  Administered 2021-06-09 – 2021-06-10 (×2): 100 mg
  Filled 2021-06-09 (×3): qty 2

## 2021-06-09 MED ORDER — INSULIN ASPART 100 UNIT/ML IJ SOLN
0.0000 [IU] | INTRAMUSCULAR | Status: DC
  Administered 2021-06-09: 2 [IU] via SUBCUTANEOUS
  Administered 2021-06-09: 3 [IU] via SUBCUTANEOUS
  Administered 2021-06-09 – 2021-06-10 (×5): 2 [IU] via SUBCUTANEOUS
  Administered 2021-06-10 (×2): 3 [IU] via SUBCUTANEOUS
  Administered 2021-06-10 – 2021-06-14 (×14): 2 [IU] via SUBCUTANEOUS

## 2021-06-09 MED ORDER — OSMOLITE 1.5 CAL PO LIQD
1000.0000 mL | ORAL | Status: DC
  Administered 2021-06-10: 1000 mL

## 2021-06-09 MED ORDER — AMLODIPINE BESYLATE 10 MG PO TABS
10.0000 mg | ORAL_TABLET | Freq: Every day | ORAL | Status: DC
  Administered 2021-06-09 – 2021-06-10 (×2): 10 mg
  Filled 2021-06-09 (×3): qty 1

## 2021-06-09 MED ORDER — TICAGRELOR 90 MG PO TABS
90.0000 mg | ORAL_TABLET | Freq: Two times a day (BID) | ORAL | Status: DC
  Administered 2021-06-09: 90 mg
  Filled 2021-06-09: qty 1

## 2021-06-09 MED ORDER — CLOPIDOGREL BISULFATE 75 MG PO TABS: 75.0000 mg | ORAL_TABLET | Freq: Every day | ORAL | Status: DC

## 2021-06-09 MED ORDER — POTASSIUM PHOSPHATES 15 MMOLE/5ML IV SOLN
30.0000 mmol | Freq: Once | INTRAVENOUS | Status: AC
  Administered 2021-06-09: 30 mmol via INTRAVENOUS
  Filled 2021-06-09: qty 10

## 2021-06-09 NOTE — Progress Notes (Signed)
STROKE TEAM PROGRESS NOTE  ? ?INTERVAL HISTORY ?Patient was extubated yesterday and is breathing well.  Continues to have dense right hemiplegia and now is complaining of diplopia.  He is on IV heparin drip.  He has a temporary pacemaker and plans are for permanent pacemaker his heart rate improves ?Vitals:  ? 06/09/21 0930 06/09/21 1000 06/09/21 1030 06/09/21 1100  ?BP:      ?Pulse: 60 60 60   ?Resp: (!) 24 (!) 0 17   ?Temp:    98.7 ?F (37.1 ?C)  ?TempSrc:    Oral  ?SpO2: 97% 98% 96%   ?Weight:      ?Height:      ? ?CBC:  ?Recent Labs  ?Lab 06/07/21 ?0903 06/07/21 ?2159 06/08/21 ?0501 06/09/21 ?0440  ?WBC 9.6  --  10.1 9.7  ?NEUTROABS 7.1  --  8.4*  --   ?HGB 13.2   < > 11.6* 11.7*  ?HCT 41.5   < > 36.1* 35.1*  ?MCV 88.9  --  87.6 86.2  ?PLT 147*  --  149* 130*  ? < > = values in this interval not displayed.  ? ?Basic Metabolic Panel:  ?Recent Labs  ?Lab 06/08/21 ?0501 06/08/21 ?1630 06/09/21 ?0440  ?NA 140  --  140  ?K 3.5  --  3.6  ?CL 109  --  109  ?CO2 21*  --  23  ?GLUCOSE 92  --  142*  ?BUN 19  --  14  ?CREATININE 1.21  --  1.04  ?CALCIUM 8.9  --  8.7*  ?MG  --  2.2 2.3  ?PHOS  --  2.8 2.2*  ? ?Lipid Panel:  ?Recent Labs  ?Lab 06/08/21 ?0501  ?CHOL 173  ?TRIG 171*  175*  ?HDL 21*  ?CHOLHDL 8.2  ?VLDL 34  ?LDLCALC 118*  ? ?HgbA1c:  ?Recent Labs  ?Lab 06/08/21 ?0501  ?HGBA1C 5.3  ? ?Urine Drug Screen: No results for input(s): LABOPIA, COCAINSCRNUR, LABBENZ, AMPHETMU, THCU, LABBARB in the last 168 hours.  ?Alcohol Level No results for input(s): ETH in the last 168 hours. ? ?IMAGING past 24 hours ?DG Abd Portable 1V ? ?Result Date: 06/08/2021 ?CLINICAL DATA:  NG tube placement. EXAM: PORTABLE ABDOMEN - 1 VIEW COMPARISON:  Radiograph earlier today FINDINGS: Previous enteric tube has been removed, there is placement of a weighted enteric tube. Tip is in the right upper quadrant in the region of the distal stomach or proximal duodenum. Right femoral transvenous pacing device is unchanged tip projecting over the  right ventricle. Air throughout nondilated small and large bowel, nonspecific pattern. IMPRESSION: Tip of the weighted enteric tube in the right upper quadrant in the region of the distal stomach or proximal duodenum. Electronically Signed   By: Narda RutherfordMelanie  Sanford M.D.   On: 06/08/2021 18:51   ? ?PHYSICAL EXAM ? ?Physical Exam  ?Constitutional: Appears well-developed and well-nourished middle-aged African-American male.  ?Cardiovascular: Normal rate and regular rhythm.  ?Respiratory: Effort normal, non-labored breathing ? ?Neuro: ?Mental Status: ?Patient is awake, following commands with left upper and lower extremities ?Dense right hemiplegia.  Eyes are slightly disconjugate with left eye hypertropia.  Slight restriction of up-and-down gaze.  Right gaze paresis.  Saccadic dysmetria.  Voice is extremely hypophonic and difficult to understand.  Follows commands well on the left side.  Mild right-sided neglect ?Cranial Nerves: ?II:  Pupils are equal, round, and reactive to light.   ?III,IV, VI: Left gaze preference, does not cross midline to the right.  Disconjugate. ?V:  ?  VII:  ?VIII: Hearing is intact to voice ?X: Cough and gag intact ?XI: Head turned to the left ?XII:   ?Motor: ?Tone is normal. Bulk is normal.  ?LUE 5/5 RUE 0/5 ?LLE 4/5 RLE 0/5 ?Sensory: ?Withdraws to pain in left lower and upper extremity, no movement noted in right upper and lower extremity ? ? ?ASSESSMENT/PLAN ?[name] is a 51 y.o. male with history of  CAD (last stent 10/22), htn, stroke 2014, ischemic cm, hyperlipidemia, and tobacco abuse, presented with a chief complaint of weakness, bradycardia, and confusion. He was initially taken to the Cath Lab, during the cath he was became agitated and had right side weakness and aphasia.  Code stroke was called in Cath Lab, concern for LVO and patient was emergently taken to IR.  Temporary pacemaker was placed by cardiology and IR, stenosis noted on cerebral angiogram however no intervention  was done. ?He remains intubated postprocedure with plans to extubate today.  Plan for MRI once permanent pacemaker is placed as temporary pacemaker is not MRI compatible. ? ?Stroke:  Possible left MCA ,right MCA and brainstem infarcts likely secondary cardiac catheterization in the setting of extensive intracranial stenosis, complete heart block and recent myocardial infarction ?Code Stroke CT head No acute abnormality.  Vascular enhancement due to cardiac cath ?CTA head & neck There is lack of significant contrast enhancement of the intracranial and extracranial vessels. ?Cerebral angio  ?1 .  Extensive moderate to severe diffuse intracranial arteriosclerotic disease involving the middle cerebral arteries the anterior cerebral arteries and the posterior circulation ?2.  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  Severe proximal basilar artery stenoses. ?4.  approximately 50 to 70% stenosis of the left internal carotid artery supraclinoid segment ?5.  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 ?7.  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 ?2D Echo EF is 40-45%, decreased LV function, mildly dilated left and right atrial size ?LDL 118 ?HgbA1c 5.3 ?VTE prophylaxis - Heparin IV ?   ?Diet  ? Diet NPO time specified  ? ?Aspirin 81 mg and brilinta 90mg  BID prior to admission, now on heparin IV ?Plavix also listed as home med ?Therapy recommendations: Pending ?Disposition: Pending ? ?Hypertension ?Home meds:  Coreg, losartan ?Stable ?BP goal 140-160 ? ?Hyperlipidemia ?LDL 118, goal < 70 ?Atorvastatin 80mg   ?Continue statin at discharge ? ?Other Stroke Risk Factors ?Coronary artery disease ?Proximal LAD is 60% stenosis, distal LAD is 80% stenosis.  Previous  mid LAD stent is patent. 1st Mrg is 80% stenosed ?Congestive systolic heart failure ?EF 35-45% ? ?Other Active Problems ?Acute inferior wall NSTEMI with second-degree heart block ?Severe symptomatic bradycardia ?Previously on aspirin and Brilinta-currently on hold for pacemaker placement, CCM initiated heparin drip ?Complete heart block requiring transvenous pacing ?Plan for permanent pacemaker with cardiology ?Acute kidney injury ?Postop respiratory insufficiency ?CCM to manage ventilator ?Plan for extubation today ?Currently on Precedex for sedation ? ?Hospital day # 2 ? ?Patient presented with confusion and weakness and was found to be bradycardic with heart block and elevated troponin so underwent emergent cardiac catheterization which showed no lesion which needed to be intervened upon but during the procedure noted to be aphasic with right hemiparesis.  Emergent cerebral catheter angiogram was performed  which showed multifocal intracranial stenosis but no LVO.  MRI was attempted but suboptimal due to patient agitation does appear to show left insular and right parietal embolic infarcts etiology probably related to his myocardial infarction and intracranial atherosclerosis hence his symptoms may have been present even before the cardiac catheterization.  Recommend dual antiplatelet therapy of aspirin and Brilinta from cardiac standpoint  but will hold Brilinta and switch to IV heparin instead. As he may need a permanent pacemaker   Continue IV heparin till he gets permanent pacemaker and then switch to aspirin and Brilinta.  He will try aggressive risk factor modification.  Patient will need physical occupational and speech therapy consultation likely inpatient rehab.  Long discussion with the patient and RN and Dr Elberta Fortis  and answered questions.  Discussed with Dr. Merrily Pew.This patient is critically ill and at significant risk of neurological worsening, death and care requires constant monitoring of vital  signs, hemodynamics,respiratory and cardiac monitoring, extensive review of multiple databases, frequent neurological assessment, discussion with family, other specialists and medical decision making of high co

## 2021-06-09 NOTE — TOC CAGE-AID Note (Signed)
Transition of Care [facility]) - CAGE-AID Screening   Patient Details  Name: Henry Simmons MRN: [id number] Date of Birth: August 18, 1970  Transition of Care [facility]) CM/SW Contact:    Erin Sons, LCSW Phone Number: 06/09/2021, 9:17 AM   Clinical Narrative:  Not currently appropriate for CAGE-AID  CAGE-AID Screening: Substance Abuse Screening unable to be completed due to: : Patient unable to participate

## 2021-06-09 NOTE — Progress Notes (Signed)
Inpatient Rehab Admissions Coordinator:  ? ?Per therapy recommendations pt was screened for CIR by Estill Dooms, PT, DPT.  Note max +2 to EOB and unable to transition past EOB at this time.  Will follow for next therapy session and progress and rescreen for tolerance.  ? ?Estill Dooms, PT, DPT ?Admissions Coordinator ?[phone] ?06/09/21  ?3:51 PM ? ?

## 2021-06-09 NOTE — Progress Notes (Signed)
Occupational Therapy Treatment ?Patient Details ?Name: Henry Simmons ?MRN: [id number] ?DOB: 16-Apr-1970 ?Today's Date: 06/09/2021 ? ? ?History of present illness 51 yo male presented to ED on 4/25 with a chief complaint of weakness and confusion. Found to be bradycardic in 2nd degree heart block, with elevated trop, EKG suggested recent inferior wall MI vs STEMI.  During the cath he was noted to be agitated, aphasic and weak on the R side.  CT without acute findings, was concern for LVO, so was taken to IR where a temporary Pacer was placed. Intubated 4/25-4/26. CT revealed small old left pericallosal infarct, small old infarct in the anterior left frontal lobe, and old bilateral basal ganglia lacunar infarcts. PMH including CAD, HTN, stroke 2014, ischemic cm, hyperlipidemia, and tobacco abuse. ?  ?OT comments ? Return to address diplopia. Providing occlusion glasses for diplopia. R eye dominant. Requiring 100% blocking of L lens. Reporting no diplopia with glasses. Able to participate in simple reaching task with LUE. Providing handout and education to wife for occlusion glasses. Continue to highly recommend dc to AIR for post-acute rehab and will continue to follow acutely as admitted.   ? ?Recommendations for follow up therapy are one component of a multi-disciplinary discharge planning process, led by the attending physician.  Recommendations may be updated based on patient status, additional functional criteria and insurance authorization. ?   ?Follow Up Recommendations ? Acute inpatient rehab (3hours/day)  ?  ?Assistance Recommended at Discharge Frequent or constant Supervision/Assistance  ?Patient can return home with the following ?   ?  ?Equipment Recommendations ? Other (comment) (Defer to next venue)  ?  ?Recommendations for Other Services Rehab consult;PT consult;Speech consult ? ?  ?Precautions / Restrictions Precautions ?Precautions: Fall;Other (comment) ?Precaution Comments: fermoral pacer; R neglect   ? ? ?  ? ?Mobility Bed Mobility ?Overal bed mobility: Needs Assistance ?Bed Mobility: Rolling, Sidelying to Sit, Sit to Sidelying ?Rolling: Max assist, +2 for physical assistance ?Sidelying to sit: Max assist, +2 for physical assistance ?  ?  ?Sit to sidelying: Max assist, +2 for physical assistance ?General bed mobility comments: Max A +2 for rolling and then raising to EOB; posterior lean both due to pushing back and R. Also reduce flexion at R hip ?  ? ?Transfers ?  ?  ?  ?  ?  ?  ?  ?  ?  ?General transfer comment: Defer for safety ?  ?  ?Balance Overall balance assessment: Needs assistance ?Sitting-balance support: No upper extremity supported, Feet supported ?Sitting balance-Leahy Scale: Poor ?Sitting balance - Comments: Achieving Min-Mod A for sitting balance ?  ?  ?  ?  ?  ?  ?  ?  ?  ?  ?  ?  ?  ?  ?  ?   ? ?ADL either performed or assessed with clinical judgement  ? ?ADL Overall ADL's : Needs assistance/impaired ?  ?  ?  ?  ?  ?  ?  ?  ?  ?  ?  ?  ?  ?  ?  ?  ?  ?  ?  ?General ADL Comments: Requiring Max-Total A over all for bathing, dressing, and toileting. Difficulty organizing movement and neglect of R side of body. ?  ? ?Extremity/Trunk Assessment Upper Extremity Assessment ?Upper Extremity Assessment: RUE deficits/detail;LUE deficits/detail ?RUE Deficits / Details: No movement. Noting increased tone to flexion synergy pattern compared to this morning. ?RUE Coordination: decreased fine motor;decreased gross motor ?LUE Deficits / Details: Apraxia.  Unable to perform thumbs up. Following commands to lift LUE overhead ?LUE Coordination: decreased fine motor ?  ?Lower Extremity Assessment ?Lower Extremity Assessment: Defer to PT evaluation ?  ?Cervical / Trunk Assessment ?Cervical / Trunk Assessment: Other exceptions ?Cervical / Trunk Exceptions: Poor trunk control ?  ? ?Vision Patient Visual Report: Diplopia ?Vision Assessment?: Yes ?Eye Alignment: Impaired (comment) ?Alignment/Gaze Preference: Gaze  left;Head turned ?Tracking/Visual Pursuits: Decreased smoothness of horizontal tracking;Decreased smoothness of vertical tracking ?Convergence: Impaired (comment) ?Diplopia Assessment: Other (comment) (Difficult to assess due to aphasia. Reporting seeing "four". Closing L eye throughout.) ?Additional Comments: Pt reporting diplopia and closing his L eye throughout. Focused session on fabrication of occlusion glasses. Occlusion blocking L lens - having to block 100% of lens. Pt reporting decrease of diplopia with glasses. ?  ?Perception Perception ?Comments: Able to turn his head to track to R. Not attending (more nelgectful) opf his RUE/RLE ?  ?Praxis   ?  ? ?Cognition Arousal/Alertness: Awake/alert ?Behavior During Therapy: [facility] for tasks assessed/performed ?Overall Cognitive Status: Difficult to assess ?  ?  ?  ?  ?  ?  ?  ?  ?  ?  ?  ?  ?  ?  ?  ?  ?General Comments: Following one step command and very motivated. Difficult to assess due to aphasia and apraxia ?  ?  ?   ?Exercises   ? ?  ?Shoulder Instructions   ? ? ?  ?General Comments HR stable throughout. SpO2 90s on RA. Notified RN  ? ? ?Pertinent Vitals/ Pain       Pain Assessment ?Pain Assessment: Faces ?Faces Pain Scale: No hurt ?Pain Intervention(s): Monitored during session ? ?Home Living Family/patient expects to be discharged to:: Private residence ?  ?  ?Type of Home: House ?  ?  ?  ?  ?  ?  ?  ?  ?  ?  ?  ?  ?  ?Additional Comments: Unable to provide information due to aphasia ? Lives With: Spouse ? ?  ?Prior Functioning/Environment    ?  ?  ?  ?   ? ?Frequency ? Min 3X/week  ? ? ? ? ?  ?Progress Toward Goals ? ?OT Goals(current goals can now be found in the care plan section) ? Progress towards OT goals: Progressing toward goals ? ?Acute Rehab OT Goals ?Patient Stated Goal: Agreeable to therapy ?OT Goal Formulation: With patient ?Time For Goal Achievement: 06/23/21 ?Potential to Achieve Goals: Good  ?Plan Discharge plan remains appropriate    ? ?Co-evaluation ? ? ? PT/OT/SLP Co-Evaluation/Treatment: Yes ?Reason for Co-Treatment: For patient/therapist safety;To address functional/ADL transfers;Necessary to address cognition/behavior during functional activity ?  ?OT goals addressed during session: ADL's and self-care ?  ? ?  ?AM-PAC OT "6 Clicks" Daily Activity     ?Outcome Measure ? ? Help from another person eating meals?: Total ?Help from another person taking care of personal grooming?: A Lot ?Help from another person toileting, which includes using toliet, bedpan, or urinal?: A Lot ?Help from another person bathing (including washing, rinsing, drying)?: A Lot ?Help from another person to put on and taking off regular upper body clothing?: A Lot ?Help from another person to put on and taking off regular lower body clothing?: Total ?6 Click Score: 10 ? ?  ?End of Session   ? ?OT Visit Diagnosis: Unsteadiness on feet (R26.81);Other abnormalities of gait and mobility (R26.89);Muscle weakness (generalized) (M62.81) ?  ?Activity Tolerance Patient tolerated treatment well ?  ?Patient  Left in bed;with call bell/phone within reach;with nursing/sitter in room ?  ?Nurse Communication Mobility status (Occlusion glasses) ?  ? ?   ? ?Time: 1345-1415 ?OT Time Calculation (min): 30 min ? ?Charges: OT General Charges ?$OT Visit: 1 Visit ? ?OT Treatments ?$Therapeutic Activity: 23-37 mins ? ?Cashmere Dingley MSOT, OTR/L ?Acute Rehab ?Pager: [phone] ?Office: [phone] ? ?Felishia Wartman M Rocket Gunderson ?06/09/2021, 2:52 PM ? ? ?

## 2021-06-09 NOTE — Anesthesia Postprocedure Evaluation (Signed)
Anesthesia Post Note ? ?Patient: Henry Simmons ? ?Procedure(s) Performed: IR WITH ANESTHESIA ? ?  ? ?Patient location during evaluation: SICU ?Anesthesia Type: General ?Level of consciousness: sedated ?Pain management: pain level controlled ?Vital Signs Assessment: post-procedure vital signs reviewed and stable ?Respiratory status: patient remains intubated per anesthesia plan ?Cardiovascular status: stable ?Postop Assessment: no apparent nausea or vomiting ?Anesthetic complications: no ? ? ?No notable events documented. ? ?Last Vitals:  ?Vitals:  ? 06/09/21 0500 06/09/21 0600  ?BP:    ?Pulse: 69 69  ?Resp:  16  ?Temp:    ?SpO2:    ?  ?Last Pain:  ?Vitals:  ? 06/09/21 0400  ?TempSrc: Axillary  ?PainSc:   ? ? ?  ?  ?  ?  ?  ?  ? ?Zyanya Glaza S ? ? ? ? ?
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
## 2021-06-09 NOTE — Progress Notes (Signed)
ANTICOAGULATION CONSULT NOTE - Follow-up Consult ? ?Pharmacy Consult for heparin ?Indication: chest pain/ACS ? ?No Known Allergies ? ?Patient Measurements: ?Height: 6\' 2"  (188 cm) ?Weight: 86.7 kg (191 lb 2.2 oz) ?IBW/kg (Calculated) : 82.2 ?Heparin Dosing Weight: 103.1 kg  ? ?Vital Signs: ?Temp: 98.7 ?F (37.1 ?C) (04/27 0750) ?Temp Source: Axillary (04/27 0400) ?BP: 121/69 (04/27 0400) ?Pulse Rate: 69 (04/27 0600) ? ?Labs: ?Recent Labs  ?  06/07/21 ?0903 06/07/21 ?1100 06/07/21 ?2159 06/08/21 ?0501 06/08/21 ?2117 06/09/21 ?0440  ?HGB 13.2  --  12.2* 11.6*  --  11.7*  ?HCT 41.5  --  36.0* 36.1*  --  35.1*  ?PLT 147*  --   --  149*  --  130*  ?HEPARINUNFRC  --   --   --   --  0.29* 0.32  ?CREATININE 1.51*  --   --  1.21  --  1.04  ?TROPONINIHS 06/11/21* 12,571*  --   --   --   --   ? ? ? ?Estimated Creatinine Clearance: 96.6 mL/min (by C-G formula based on SCr of 1.04 mg/dL). ? ? ?Assessment: ?35 YOM with NSTEMI and likely AIS. Will target a more conservative heparin level due to probable stroke.  ? ?Heparin level 0.32 on 1,450 units/hr. CBC stable. No bleeding or issues with infusion noted. Increasing heparin rate slightly to remain within goal range.  ? ?Goal of Therapy:  ?Heparin level 0.3-0.5 units/mL ?Monitor platelets by anticoagulation protocol: Yes ?  ?Plan:  ?Increase heparin to 1,500 units/hr  ?Daily HL and CBC  ?Continue to monitor H&H and platelets ? ? ? ?

## 2021-06-09 NOTE — Progress Notes (Signed)
Speech Language Pathology Treatment: Dysphagia  ?Patient Details ?Name: Henry Simmons ?MRN: [id number] ?DOB: 11-Jul-1970 ?Today's Date: 06/09/2021 ?Time: 3818-2993 ?SLP Time Calculation (min) (ACUTE ONLY): 9 min ? ?Assessment / Plan / Recommendation ?Clinical Impression ? Pt continues with head of bed restriction to 30 degrees and therapist utilized reverse Trendelenberg position and he accepted applesauce and straw sips water. Unlike yesterday, there were no indications of airway intrusion and voice low but clear when pt able to spontaneously vocalize across trials. Appeared to coordinate respirations and swallow although unable to deem safe with po's from subjective observation. Higher opportunity for silent aspiration in pt with significant verbal apraxia and recommend FEES (as schedule allows tomorrow). Pt has [place].  ?  ?HPI HPI: 51 yo man with CAD, HTN, stroke 2014, ischemic cm, hyperlipidemia, and tobacco abuse, presented with a chief complaint of weakness and confusion. Found to be bradycardic in 2nd degree heart block, with elevated trop, EKG suggested recent inferior wall MI vs STEMI.  During the cath he was noted to be agitated, aphasic and weak on the R side.  CT without acute findings, was concern for LVO, so was taken to IR where a temporary Pacer was placed. Intubated 1 day. CT revealed small old left pericallosal infarct, small old infarct in the anterior left frontal lobe, and old bilateral basal ganglia lacunar infarcts. Dr. Pearlean Brownie impression as follows: "Possible left MCA ,right MCA and brainstem infarcts likely secondary cardiac catheterization in the setting of extensive intracranial stenosis". ?  ?   ?SLP Plan ? New goals to be determined pending instrumental study ? ?  ?  ?Recommendations for follow up therapy are one component of a multi-disciplinary discharge planning process, led by the attending physician.  Recommendations may be updated based on patient status, additional functional criteria  and insurance authorization. ?  ? ?Recommendations  ?Diet recommendations: NPO ?Medication Administration: Via alternative means  ?   ?    ?   ? ? ? ? General recommendations: Rehab consult ?Oral Care Recommendations: Oral care QID ?Follow Up Recommendations: Acute inpatient rehab (3hours/day) ?Assistance recommended at discharge: Frequent or constant Supervision/Assistance ?SLP Visit Diagnosis: Dysphagia, unspecified (R13.10) ?Plan: New goals to be determined pending instrumental study ? ? ? ? ?  ?  ? ? ?Royce Macadamia ? ?06/09/2021, 1:29 PM ?

## 2021-06-09 NOTE — Evaluation (Addendum)
Occupational Therapy Evaluation ?Patient Details ?Name: Henry Simmons ?MRN: [id number] ?DOB: [birth year] ?Today's Date: 06/09/2021 ? ? ?History of Present Illness [age] yo male presented to ED on 4/25 with a chief complaint of weakness and confusion. Found to be bradycardic in 2nd degree heart block, with elevated trop, EKG suggested recent inferior wall MI vs STEMI.  During the cath he was noted to be agitated, aphasic and weak on the R side.  CT without acute findings, was concern for LVO, so was taken to IR where a temporary Pacer was placed. Intubated 4/25-4/26. CT revealed small old left pericallosal infarct, small old infarct in the anterior left frontal lobe, and old bilateral basal ganglia lacunar infarcts. PMH including CAD, HTN, stroke 2014, ischemic cm, hyperlipidemia, and tobacco abuse.  ? ?Clinical Impression ?  ?PTA, pt was living with his wife and was independent and working; per chart review.  Pt currently requiring Max-Total A for ADLs and bed mobility. Pt presenting with decreased attention to R side, preference for L gaze/head turn, decreased motor planning, poor balance, and diplopia. Despite limitations, pt very motivated to engage in therapy. Checking with RN prior to session and RN checking lines at end of session. Pt would benefit from further acute OT to facilitate safe dc. Due to patient's age, motivation, and functional change, recommend dc to AIR for further OT to optimize safety, independence with ADLs, and return to PLOF.  ?   ? ?Recommendations for follow up therapy are one component of a multi-disciplinary discharge planning process, led by the attending physician.  Recommendations may be updated based on patient status, additional functional criteria and insurance authorization.  ? ?Follow Up Recommendations ? Acute inpatient rehab (3hours/day)  ?  ?Assistance Recommended at Discharge Frequent or constant Supervision/Assistance  ?Patient can return home with the following   ? ?   ?Functional Status Assessment ? Patient has had a recent decline in their functional status and demonstrates the ability to make significant improvements in function in a reasonable and predictable amount of time.  ?Equipment Recommendations ? Other (comment) (Defer to next venue)  ?  ?Recommendations for Other Services Rehab consult;PT consult;Speech consult ? ? ?  ?Precautions / Restrictions Precautions ?Precautions: Fall;Other (comment) ?Precaution Comments: fermoral pacer; R neglect  ? ?  ? ?Mobility Bed Mobility ?Overal bed mobility: Needs Assistance ?Bed Mobility: Rolling, Sidelying to Sit, Sit to Sidelying ?Rolling: Max assist, +2 for physical assistance ?Sidelying to sit: Max assist, +2 for physical assistance ?  ?  ?Sit to sidelying: Max assist, +2 for physical assistance ?General bed mobility comments: Max A +2 for rolling and then raising to EOB; posterior lean both due to pushing back and R. Also reduce flexion at R hip ?  ? ?Transfers ?  ?  ?  ?  ?  ?  ?  ?  ?  ?General transfer comment: [place] for safety ?  ? ?  ?Balance Overall balance assessment: Needs assistance ?Sitting-balance support: No upper extremity supported, Feet supported ?Sitting balance-Leahy Scale: Poor ?Sitting balance - Comments: Achieving Min-Mod A for sitting balance ?  ?  ?  ?  ?  ?  ?  ?  ?  ?  ?  ?  ?  ?  ?  ?   ? ?ADL either performed or assessed with clinical judgement  ? ?ADL Overall ADL's : Needs assistance/impaired ?  ?  ?  ?  ?  ?  ?  ?  ?  ?  ?  ?  ?  ?  ?  ?  ?  ?  ?  ?  General ADL Comments: Requiring Max-Total A over all for bathing, dressing, and toileting. Difficulty organizing movement and neglect of R side of body.  ? ? ? ?Vision Patient Visual Report: Diplopia ?Vision Assessment?: Yes ?Eye Alignment: Impaired (comment) ?Alignment/Gaze Preference: Gaze left;Head turned ?Tracking/Visual Pursuits: Decreased smoothness of horizontal tracking;Decreased smoothness of vertical tracking ?Convergence: Impaired  (comment) ?Diplopia Assessment: Other (comment) (Difficult to assess due to aphasia. Reporting seeing "four". Closing L eye throughout.)  ?   ?Perception Perception ?Comments: Able to turn his head to track to R. Not attending (more nelgectful) opf his RUE/RLE ?  ?Praxis   ?  ? ?Pertinent Vitals/Pain Pain Assessment ?Pain Assessment: Faces ?Faces Pain Scale: No hurt ?Pain Intervention(s): Monitored during session  ? ? ? ?Hand Dominance Right ?  ?Extremity/Trunk Assessment Upper Extremity Assessment ?Upper Extremity Assessment: RUE deficits/detail;LUE deficits/detail ?RUE Deficits / Details: No movement. Slight tightness at end range. ?RUE Coordination: decreased fine motor;decreased gross motor ?LUE Deficits / Details: Apraxia. Unable to perform thumbs up. Following commands to lift LUE overhead ?LUE Coordination: decreased fine motor ?  ?Lower Extremity Assessment ?Lower Extremity Assessment: Defer to PT evaluation ?  ?Cervical / Trunk Assessment ?Cervical / Trunk Assessment: Other exceptions ?Cervical / Trunk Exceptions: Poor trunk control ?  ?Communication Communication ?Communication: Expressive difficulties ?  ?Cognition Arousal/Alertness: Awake/alert ?Behavior During Therapy: [facility] for tasks assessed/performed ?Overall Cognitive Status: Difficult to assess ?  ?  ?  ?  ?  ?  ?  ?  ?  ?  ?  ?  ?  ?  ?  ?  ?General Comments: Following one step command and very motivated. Difficult to assess due to aphasia and apraxia ?  ?  ?General Comments  HR stable throughout. SpO2 90s on RA. Notified RN ? ?  ?Exercises   ?  ?Shoulder Instructions    ? ? ?Home Living Family/patient expects to be discharged to:: Private residence ?  ?  ?  ?  ?  ?  ?  ?  ?  ?  ?  ?  ?  ?  ?  ?  ?Additional Comments: Unable to provide information due to aphasia ?  ? ?  ?Prior Functioning/Environment Prior Level of Function : Independent/Modified Independent;Working/employed ?  ?  ?  ?  ?  ?  ?  ?ADLs Comments: Per chart, independent and working. ?   ? ?  ?  ?OT Problem List: Decreased strength;Decreased range of motion;Decreased activity tolerance;Impaired balance (sitting and/or standing);Impaired vision/perception;Decreased coordination;Decreased cognition;Decreased safety awareness;Decreased knowledge of use of DME or AE;Decreased knowledge of precautions ?  ?   ?OT Treatment/Interventions: Self-care/ADL training;Therapeutic exercise;Energy conservation;DME and/or AE instruction;Therapeutic activities;Patient/family education  ?  ?OT Goals(Current goals can be found in the care plan section) Acute Rehab OT Goals ?Patient Stated Goal: Agreeable to therapy ?OT Goal Formulation: With patient ?Time For Goal Achievement: 06/23/21 ?Potential to Achieve Goals: Good  ?OT Frequency: Min 3X/week ?  ? ?Co-evaluation PT/OT/SLP Co-Evaluation/Treatment: Yes ?Reason for Co-Treatment: For patient/therapist safety;To address functional/ADL transfers;Necessary to address cognition/behavior during functional activity ?  ?OT goals addressed during session: ADL's and self-care ?  ? ?  ?AM-PAC OT "6 Clicks" Daily Activity     ?Outcome Measure Help from another person eating meals?: Total ?Help from another person taking care of personal grooming?: A Lot ?Help from another person toileting, which includes using toliet, bedpan, or urinal?: A Lot ?Help from another person bathing (including washing, rinsing, drying)?: A Lot ?Help from another person to put  on and taking off regular upper body clothing?: A Lot ?Help from another person to put on and taking off regular lower body clothing?: Total ?6 Click Score: 10 ?  ?End of Session Nurse Communication: Mobility status (Checked in for mobility prior to session. Monitored and checked fem pacer pre- and post- movement.) ? ?Activity Tolerance: Patient tolerated treatment well ?Patient left: in bed;with call bell/phone within reach;with nursing/sitter in room ? ?OT Visit Diagnosis: Unsteadiness on feet (R26.81);Other abnormalities of  gait and mobility (R26.89);Muscle weakness (generalized) (M62.81)  ?              ?Time: 7915-0569 ?OT Time Calculation (min): 34 min ?Charges:  OT General Charges ?$OT Visit: 1 Visit ?OT Evaluation ?$OT Eval Moderate Complexity:

## 2021-06-09 NOTE — Progress Notes (Signed)
Ref: Orpah Cobb, MD ? ? ?Subjective:  ?Awake. ?Extubated. VS stable. ?Blood work stable. ?Right sided hemiplegia continues. ?3rd degree heart block continues. ?EP consult appreciated. They prefer to wait 2-3 more days prior to pacemaker insertion as AV block may be transient. ? ?Objective:  ?Vital Signs in the last 24 hours: ?Temp:  [98.4 ?F (36.9 ?C)-99.6 ?F (37.6 ?C)] 98.7 ?F (37.1 ?C) (04/27 1100) ?Pulse Rate:  [60-71] 60 (04/27 1030) ?Cardiac Rhythm: Ventricular paced (04/27 0800) ?Resp:  [0-31] 17 (04/27 1030) ?BP: (100-154)/(52-86) 123/81 (04/27 0800) ?SpO2:  [93 %-100 %] 96 % (04/27 1030) ?Arterial Line BP: (109-176)/(61-88) 149/69 (04/27 1030) ?Weight:  [86.7 kg] 86.7 kg (04/27 0400) ? ?Physical Exam: ?BP Readings from Last 1 Encounters:  ?06/09/21 123/81  ?   ?Wt Readings from Last 1 Encounters:  ?06/09/21 86.7 kg  ?  Weight change: -17.3 kg Body mass index is 24.54 kg/m?. ?HEENT: [place]/AT, Eyes-Brown, Conjunctiva-Pink, Sclera-Non-icteric ?Neck: No JVD, No bruit, Trachea midline. ?Lungs:  Clear, Bilateral. ?Cardiac:  Regular rhythm, normal S1 and S2, no S3. II/VI systolic murmur. ?Abdomen:  Soft, non-tender. BS present. ?Extremities:  No edema present. No cyanosis. No clubbing. ?CNS: AxOx2, Cranial nerves grossly intact, moves left side well.  ?Skin: Warm and dry. ? ? ?Intake/Output from previous day: ?04/26 0701 - 04/27 0700 ?In: 1601.6 [I.V.:1111.6; NG/GT:490] ?Out: 1650 [Urine:1650] ? ? ? ?Lab Results: ?BMET ?   ?Component Value Date/Time  ? NA 140 06/09/2021 0440  ? NA 140 06/08/2021 0501  ? NA 140 06/07/2021 2159  ? K 3.6 06/09/2021 0440  ? K 3.5 06/08/2021 0501  ? K 3.6 06/07/2021 2159  ? CL 109 06/09/2021 0440  ? CL 109 06/08/2021 0501  ? CL 107 06/07/2021 0903  ? CO2 23 06/09/2021 0440  ? CO2 21 (L) 06/08/2021 0501  ? CO2 24 06/07/2021 0903  ? GLUCOSE 142 (H) 06/09/2021 0440  ? GLUCOSE 92 06/08/2021 0501  ? GLUCOSE 119 (H) 06/07/2021 8119  ? BUN 14 06/09/2021 0440  ? BUN 19 06/08/2021 0501  ? BUN 18  06/07/2021 0903  ? CREATININE 1.04 06/09/2021 0440  ? CREATININE 1.21 06/08/2021 0501  ? CREATININE 1.51 (H) 06/07/2021 1478  ? CALCIUM 8.7 (L) 06/09/2021 0440  ? CALCIUM 8.9 06/08/2021 0501  ? CALCIUM 9.3 06/07/2021 0903  ? GFRNONAA >60 06/09/2021 0440  ? GFRNONAA >60 06/08/2021 0501  ? GFRNONAA 55 (L) 06/07/2021 2956  ? GFRAA 79 (L) 10/25/2012 2130  ? GFRAA 86 (L) 10/24/2012 1954  ? GFRAA 90 (L) 10/24/2012 0957  ? ?CBC ?   ?Component Value Date/Time  ? WBC 9.7 06/09/2021 0440  ? RBC 4.07 (L) 06/09/2021 0440  ? HGB 11.7 (L) 06/09/2021 0440  ? HCT 35.1 (L) 06/09/2021 0440  ? PLT 130 (L) 06/09/2021 0440  ? MCV 86.2 06/09/2021 0440  ? MCH 28.7 06/09/2021 0440  ? MCHC 33.3 06/09/2021 0440  ? RDW 14.0 06/09/2021 0440  ? LYMPHSABS 0.7 06/08/2021 0501  ? MONOABS 0.9 06/08/2021 0501  ? EOSABS 0.0 06/08/2021 0501  ? BASOSABS 0.0 06/08/2021 0501  ? ?HEPATIC Function Panel ?Recent Labs  ?  11/29/20 ?1258 06/07/21 ?0903  ?PROT 7.7 6.8  ? ?HEMOGLOBIN A1C ?No components found for: HGA1C,  MPG ?CARDIAC ENZYMES ?Lab Results  ?Component Value Date  ? CKTOTAL 319 (H) 09/18/2010  ? CKMB 2.0 09/18/2010  ? TROPONINI <0.30 10/24/2012  ? TROPONINI <0.30 09/15/2012  ? TROPONINI <0.30 09/18/2010  ? ?BNP ?No results for input(s): PROBNP in the last  8760 hours. ?TSH ?Recent Labs  ?  11/30/20 ?0305  ?TSH 1.016  ? ?CHOLESTEROL ?Recent Labs  ?  11/30/20 ?0305 06/08/21 ?0501  ?CHOL 157 173  ? ? ?Scheduled Meds: ? amLODipine  10 mg Per Tube Daily  ? vitamin C  500 mg Per Tube Daily  ? aspirin  81 mg Per Tube Daily  ? atorvastatin  80 mg Per Tube Daily  ? chlorhexidine  15 mL Mouth Rinse BID  ? Chlorhexidine Gluconate Cloth  6 each Topical Daily  ? feeding supplement (PROSource TF)  45 mL Per Tube BID  ? insulin aspart  0-15 Units Subcutaneous Q4H  ? losartan  100 mg Per Tube Daily  ? mouth rinse  15 mL Mouth Rinse BID  ? pantoprazole sodium  40 mg Per Tube Daily  ? sodium chloride flush  3 mL Intravenous Q12H  ? ?Continuous Infusions: ? sodium  chloride    ? sodium chloride    ? clevidipine 6 mg/hr (06/09/21 1000)  ? feeding supplement (OSMOLITE 1.5 CAL) 40 mL/hr at 06/08/21 1800  ? heparin 1,500 Units/hr (06/09/21 1000)  ? potassium PHOSPHATE IVPB (in mmol) 30 mmol (06/09/21 1148)  ? ?PRN Meds:.sodium chloride, sodium chloride, acetaminophen **OR** acetaminophen (TYLENOL) oral liquid 160 mg/5 mL **OR** acetaminophen, nitroGLYCERIN, ondansetron (ZOFRAN) IV, sodium chloride flush ? ?Assessment/Plan: ? NSTEMI ?Stroke with right hemiplegia ?3rd. Degree heart block ?CAD ?S/P LAD stent ?HTN ?HLD ?HCM ?PVD ? ?Plan: ?Continue weaning off pressor agents. ?Rehab consult when stable. ? ? LOS: 2 days  ? ?Time spent including chart review, lab review, examination, discussion with patient/Family/Nurse : 30 min ? ? ?Orpah Cobb  MD  ?06/09/2021, 11:49 AM ? ? ? ? ?

## 2021-06-09 NOTE — Evaluation (Signed)
Physical Therapy Evaluation ?Patient Details ?Name: Henry Simmons ?MRN: [id number] ?DOB: 1971/01/02 ?Today's Date: 06/09/2021 ? ?History of Present Illness ? 51 yo male presented to ED on 4/25 with a chief complaint of weakness and confusion. Found to be bradycardic in 2nd degree heart block, with elevated trop, EKG suggested recent inferior wall MI vs STEMI.  During the cath he was noted to be agitated, aphasic and weak on the R side.  CT without acute findings, was concern for LVO, so was taken to IR where a temporary Pacer was placed. Intubated 4/25-4/26. CT revealed small old left pericallosal infarct, small old infarct in the anterior left frontal lobe, and old bilateral basal ganglia lacunar infarcts. Awaiting repeat head CT. PMH including CAD, HTN, stroke 2014, ischemic cm, hyperlipidemia, and tobacco abuse. ?  ?Clinical Impression ? Pt presents with condition above and deficits mentioned below, see PT Problem List. PTA, pt was living with his wife and was independent and working; per chart review. Pt is currently requiring maxAx2 for all bed mobility. Pt initially displayed a strong posterior and R lateral lean needing maxA for static sitting balance, but progressed to min-modA with verbal and visual cues to lean to his L. Pt demonstrates apraxia in his L side, R sided weakness, decreased attention to R side, preference for L gaze/head turn, diplopia, R hamstring spasticity (modified ashworth scale 1+), and deficits in expressive language and balance. Pt also with potential synergy patterns. Despite limitations, pt is very motivated to engage in therapy. Checking with RN prior to session and RN checking lines at end of session. Due to patient's age, motivation, and drastic decline in functional status, recommend d/c to AIR to maximize his independence and safety with all functional mobility. Will continue to follow acutely. ?   ? ?Recommendations for follow up therapy are one component of a multi-disciplinary  discharge planning process, led by the attending physician.  Recommendations may be updated based on patient status, additional functional criteria and insurance authorization. ? ?Follow Up Recommendations Acute inpatient rehab (3hours/day) ? ?  ?Assistance Recommended at Discharge Frequent or constant Supervision/Assistance  ?Patient can return home with the following ? Two people to help with walking and/or transfers;Two people to help with bathing/dressing/bathroom;Assistance with cooking/housework;Direct supervision/assist for medications management;Assistance with feeding;Direct supervision/assist for financial management;Assist for transportation;Help with stairs or ramp for entrance ? ?  ?Equipment Recommendations BSC/3in1;Wheelchair (measurements PT);Wheelchair cushion (measurements PT);Hospital bed;Other (comment);Rolling walker (2 wheels) (hoyer lift; pending progress)  ?Recommendations for Other Services ? Rehab consult  ?  ?Functional Status Assessment Patient has had a recent decline in their functional status and demonstrates the ability to make significant improvements in function in a reasonable and predictable amount of time.  ? ?  ?Precautions / Restrictions Precautions ?Precautions: Fall;Other (comment) ?Precaution Comments: fermoral pacer; R neglect ?Restrictions ?Weight Bearing Restrictions: No  ? ?  ? ?Mobility ? Bed Mobility ?Overal bed mobility: Needs Assistance ?Bed Mobility: Rolling, Sidelying to Sit, Sit to Sidelying ?Rolling: Max assist, +2 for physical assistance ?Sidelying to sit: Max assist, +2 for physical assistance ?  ?  ?Sit to sidelying: Max assist, +2 for physical assistance ?General bed mobility comments: Max A +2 for rolling and then raising to EOB; posterior lean both due to pushing back and R. Also reduce flexion at R hip. Moves L leg but needs directing due to apraxia. ?  ? ?Transfers ?  ?  ?  ?  ?  ?  ?  ?  ?  ?General  transfer comment: Defer for safety ?   ? ?Ambulation/Gait ?  ?  ?  ?  ?  ?  ?  ?General Gait Details: defer for safety ? ?Stairs ?  ?  ?  ?  ?  ? ?Wheelchair Mobility ?  ? ?Modified Rankin (Stroke Patients Only) ?  ? ?  ? ?Balance Overall balance assessment: Needs assistance ?Sitting-balance support: No upper extremity supported, Feet supported ?Sitting balance-Leahy Scale: Poor ?Sitting balance - Comments: Achieving Min-Mod A for sitting balance with verbal and visual cues to bring L shoulder to PT on L. Initially requiring maxA due to posterior and R lean ?Postural control: Posterior lean, Right lateral lean ?  ?  ?Standing balance comment: defer for safety ?  ?  ?  ?  ?  ?  ?  ?  ?  ?  ?  ?   ? ? ? ?Pertinent Vitals/Pain Pain Assessment ?Pain Assessment: Faces ?Faces Pain Scale: No hurt ?Pain Intervention(s): Monitored during session  ? ? ?Home Living Family/patient expects to be discharged to:: Private residence ?Living Arrangements: Spouse/significant other ?  ?Type of Home: House ?  ?  ?  ?  ?  ?  ?Additional Comments: Unable to provide information due to aphasia  ?  ?Prior Function Prior Level of Function : Independent/Modified Independent;Working/employed ?  ?  ?  ?  ?  ?  ?Mobility Comments: Per chart, independent and working. ?ADLs Comments: Per chart, independent and working. ?  ? ? ?Hand Dominance  ? Dominant Hand: Right ? ?  ?Extremity/Trunk Assessment  ? Upper Extremity Assessment ?Upper Extremity Assessment: Defer to OT evaluation ?RUE Deficits / Details: No movement. Noting increased tone to flexion synergy pattern compared to this morning. ?RUE Coordination: decreased fine motor;decreased gross motor ?LUE Deficits / Details: Apraxia. Unable to perform thumbs up. Following commands to lift LUE overhead ?LUE Coordination: decreased fine motor ?  ? ?Lower Extremity Assessment ?Lower Extremity Assessment: RLE deficits/detail;LLE deficits/detail ?RLE Deficits / Details: withdraws with delay to stimulation at foot, possible decreased  sensation; occasional spontaneous movement noted; modified ashworth scale 1+ in hamstrings ?RLE Coordination: decreased gross motor;decreased fine motor ?LLE Deficits / Details: Apraxia noted, but able to lift leg against gravity and appears to have WFL AROM; responds to touch stimulation at foot, possibly hypersensative by pt withdrawing with just lightly brushing lower leg at one point during session ?LLE Coordination: decreased gross motor;decreased fine motor ?  ? ?Cervical / Trunk Assessment ?Cervical / Trunk Assessment: Other exceptions ?Cervical / Trunk Exceptions: Poor trunk control  ?Communication  ? Communication: Expressive difficulties  ?Cognition Arousal/Alertness: Awake/alert ?[facility] During [facility] for tasks assessed/performed ?Overall Cognitive Status: Difficult to assess ?  ?  ?  ?  ?  ?  ?  ?  ?  ?  ?  ?  ?  ?  ?  ?  ?General Comments: Following one step command and very motivated. Difficult to assess due to aphasia and apraxia ?  ?  ? ?  ?General Comments General comments (skin integrity, edema, etc.): HR stable throughout. SpO2 90s on RA. Notified RN ? ?  ?Exercises    ? ?Assessment/Plan  ?  ?PT Assessment Patient needs continued PT services  ?PT Problem List Decreased strength;Decreased range of motion;Decreased activity tolerance;Decreased balance;Decreased mobility;Decreased coordination;Decreased cognition;Decreased knowledge of use of DME;Decreased safety awareness;Cardiopulmonary status limiting activity;Impaired sensation ? ?   ?  ?PT Treatment Interventions DME instruction;Gait training;Stair training;Functional mobility training;Therapeutic activities;Therapeutic exercise;Neuromuscular re-education;Balance training;Cognitive remediation;Patient/family  education;Wheelchair mobility training   ? ?PT Goals (Current goals can be found in the Care Plan section)  ?Acute Rehab PT Goals ?Patient Stated Goal: agreeable to session and appears motivated ?PT Goal Formulation: With  patient ?Time For Goal Achievement: 06/23/21 ?Potential to Achieve Goals: Good ? ?  ?Frequency Min 4X/week ?  ? ? ?Co-evaluation PT/OT/SLP Co-Evaluation/Treatment: Yes ?Reason for Co-Treatment: Complexity of the patient's impairments (mul
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
## 2021-06-09 NOTE — Progress Notes (Addendum)
? ?Progress Note ? ?Patient Name: Henry Simmons ?Date of Encounter: 06/09/2021 ? ?[facility] Cardiologist: Dr. Doylene Canard ? ?Subjective  ? ?Awake, alert, aphasic though attempts to speak, follows commands L side ? ?Inpatient Medications  ?  ?Scheduled Meds: ? amLODipine  10 mg Oral Daily  ? vitamin C  500 mg Per Tube Daily  ? aspirin  81 mg Per Tube Daily  ? atorvastatin  80 mg Per Tube Daily  ? chlorhexidine  15 mL Mouth Rinse BID  ? Chlorhexidine Gluconate Cloth  6 each Topical Daily  ? feeding supplement (PROSource TF)  45 mL Per Tube BID  ? insulin aspart  0-15 Units Subcutaneous Q4H  ? losartan  100 mg Per Tube Daily  ? mouth rinse  15 mL Mouth Rinse BID  ? pantoprazole sodium  40 mg Per Tube Daily  ? sodium chloride flush  3 mL Intravenous Q12H  ? ?Continuous Infusions: ? sodium chloride    ? sodium chloride    ? clevidipine 11 mg/hr (06/09/21 0714)  ? feeding supplement (OSMOLITE 1.5 CAL) 40 mL/hr at 06/08/21 1800  ? heparin 1,500 Units/hr (06/09/21 0800)  ? potassium PHOSPHATE IVPB (in mmol)    ? ?PRN Meds: ?sodium chloride, sodium chloride, acetaminophen **OR** acetaminophen (TYLENOL) oral liquid 160 mg/5 mL **OR** acetaminophen, nitroGLYCERIN, ondansetron (ZOFRAN) IV, sodium chloride flush  ? ?Vital Signs  ?  ?Vitals:  ? 06/09/21 0300 06/09/21 0400 06/09/21 0500 06/09/21 0600  ?BP:  121/69    ?Pulse: 69 69 69 69  ?Resp: 16 (!) 24  16  ?Temp:  99.3 ?F (37.4 ?C)    ?TempSrc:  Axillary    ?SpO2: 98% 96%    ?Weight:  86.7 kg    ?Height:      ? ? ?Intake/Output Summary (Last 24 hours) at 06/09/2021 0833 ?Last data filed at 06/09/2021 0600 ?Gross per 24 hour  ?Intake 1508.47 ml  ?Output 1650 ml  ?Net -141.53 ml  ? ? ?  06/09/2021  ?  4:00 AM 06/07/2021  ?  8:42 AM 12/01/2020  ?  4:26 AM  ?Last 3 Weights  ?Weight (lbs) 191 lb 2.2 oz 229 lb 4.5 oz 180 lb 8.9 oz  ?Weight (kg) 86.7 kg 104 kg 81.9 kg  ?   ? ?Telemetry  ?  ?V pacing, CHB underlying - Personally Reviewed ? ?ECG  ?  ?CHB with pacing held - Personally  Reviewed ? ?Physical Exam  ? ?GEN: No acute distress, though struggling to try and speak   ?Neck: No JVD ?Cardiac: RRR, no murmurs, rubs, or gallops.  ?Respiratory: CTA b/l. ?GI: Soft, nontender, non-distended  ?MS: No edema; No deformity. ?Neuro:  AA, following commands with L side, no movement on R ?Psych: difficult to assess, though very cooperative ? ?Labs  ?  ?High Sensitivity Troponin:   ?Recent Labs  ?Lab 06/07/21 ?0903 06/07/21 ?1100  ?TROPONINIHS [id number]* 12,571*  ?   ?Chemistry ?Recent Labs  ?Lab 06/07/21 ?0903 06/07/21 ?2159 06/08/21 ?0501 06/08/21 ?1630 06/09/21 ?0440  ?NA 140 140 140  --  140  ?K 4.0 3.6 3.5  --  3.6  ?CL 107  --  109  --  109  ?CO2 24  --  21*  --  23  ?GLUCOSE 119*  --  92  --  142*  ?BUN 18  --  19  --  14  ?CREATININE 1.51*  --  1.21  --  1.04  ?CALCIUM 9.3  --  8.9  --  8.7*  ?MG 2.2  --   --  2.2 2.3  ?PROT 6.8  --   --   --   --   ?ALBUMIN 3.7  --   --   --   --   ?AST 81*  --   --   --   --   ?ALT 20  --   --   --   --   ?ALKPHOS 46  --   --   --   --   ?BILITOT 1.3*  --   --   --   --   ?GFRNONAA 55*  --  >60  --  >60  ?ANIONGAP 9  --  10  --  8  ?  ?Lipids  ?Recent Labs  ?Lab 06/08/21 ?0501  ?CHOL 173  ?TRIG 171*  175*  ?HDL 21*  ?LDLCALC 118*  ?CHOLHDL 8.2  ?  ?Hematology ?Recent Labs  ?Lab 06/07/21 ?0903 06/07/21 ?2159 06/08/21 ?0501 06/09/21 ?0440  ?WBC 9.6  --  10.1 9.7  ?RBC 4.67  --  4.12* 4.07*  ?HGB 13.2 12.2* 11.6* 11.7*  ?HCT 41.5 36.0* 36.1* 35.1*  ?MCV 88.9  --  87.6 86.2  ?MCH 28.3  --  28.2 28.7  ?MCHC 31.8  --  32.1 33.3  ?RDW 14.3  --  14.2 14.0  ?PLT 147*  --  149* 130*  ? ?Thyroid No results for input(s): TSH, FREET4 in the last 168 hours.  ?BNPNo results for input(s): BNP, PROBNP in the last 168 hours.  ?DDimer No results for input(s): DDIMER in the last 168 hours.  ? ?Radiology  ?  ?Cardiac Studies  ? ?06/07/21: TTE ?IMPRESSIONS  ? 1. Left ventricular ejection fraction, by estimation, is 40 to 45%. The  ?left ventricle has mildly decreased function. The left  ventricle  ?demonstrates regional wall motion abnormalities (see scoring  ?diagram/findings for description). There is severe  ?concentric left ventricular hypertrophy. Left ventricular diastolic  ?parameters are consistent with Grade I diastolic dysfunction (impaired  ?relaxation). There is severe hypokinesis of the left ventricular, entire  ?inferior wall and inferoseptal wall.  ? 2. Right ventricular systolic function is normal. The right ventricular  ?size is normal. There is normal pulmonary artery systolic pressure.  ? 3. Left atrial size was mildly dilated.  ? 4. Right atrial size was mildly dilated.  ? 5. The mitral valve is normal in structure. Moderate mitral valve  ?regurgitation.  ? 6. The aortic valve is tricuspid. Aortic valve regurgitation is not  ?visualized.  ? 7. The inferior vena cava is normal in size with <50% respiratory  ?variability, suggesting right atrial pressure of 8 mmHg.  ? 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 Cardiac interventions.  ?  ?  ?06/07/2021: aborted LHC ?  Mid LM to Dist LM lesion is 25% stenosed. ?  Ramus lesion is 80% stenosed. ?  2nd Diag lesion is 20% stenosed. ?  Prox LAD lesion is 60% stenosed. ?  Dist LAD lesion is 80% stenosed. ?  1st Mrg lesion is 80% stenosed. ?  Previously placed Mid LAD stent (unknown type) is  widely patent. ?  The left ventricular ejection fraction is 35-45% by visual estimate. ?  ?Stroke work up in progress. ?Per wife patient had stroke symptoms wax and wane since this morning prior to arrival to ER. ?Medical treatment for now. ?  ?Cath: 11/30/2020 ?  Mid LM to Dist LM lesion is 25% stenosed. ?  Mid LAD lesion is 90%  stenosed. ?  2nd Diag lesion is 20% stenosed. ?  1st Mrg lesion is 100% stenosed. ?  Mid Cx to Dist Cx lesion is 100% stenosed. ?  Prox RCA lesion is 50% stenosed. ?  Mid RCA to Dist RCA lesion is 40% stenosed. ?  Dist RCA lesion is 65% stenosed. ?  Ramus lesion is 80% stenosed. ?  A  drug-eluting stent was successfully placed using a STENT ONYX FRONTIER [id number]. ?  Post intervention, there is a 0% residual stenosis. ?  LV end diastolic pressure is normal. ?  ?3 vessel CAD. The culprit lesion is the first OM which is occluded. The distal LCx is also occluded. There is a segmental 90% mid LAD stenosis. 80% ramus intermediate which is small to moderate in size. 65% distal RCA. ?Normal LVEDP ?Successful PCI of the mid LAD with 2.75 x 38 mm Onyx Frontier with IVUS guidance ?  ?Plan: DAPT for one year. Aggressive risk factor modification. Mannix Kroeker treat residual disease medically. Anticipate DC tomorrow.  ?  ?  ?Echo: 11/29/20 ?IMPRESSIONS  ? 1. Consider cardiac amyloid.  ? 2. Left ventricular ejection fraction, by estimation, is 35 to 40%. The  ?left ventricle has severely decreased function. The left ventricle  ?demonstrates global hypokinesis. There is severe concentric left  ?ventricular hypertrophy. Left ventricular  ?diastolic parameters are consistent with Grade II diastolic dysfunction  ?(pseudonormalization).  ? 3. Right ventricular systolic function is normal. The right ventricular  ?size is normal.  ? 4. The pericardial effusion is anterior to the right ventricle.  ? 5. The mitral valve is normal in structure. Trivial mitral valve  ?regurgitation. No evidence of mitral stenosis.  ? 6. The aortic valve is normal in structure. Aortic valve regurgitation is  ?not visualized. No aortic stenosis is present.  ? 7. The inferior vena cava is normal in size with greater than 50%  ?respiratory variability, suggesting right atrial pressure of 3 mmHg.  ? 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  ? ?Patient Profile  ?   ?51 y.o. male HTN, prior strokes (2014. 2015), HLD, smoker, CAD admitted 05/07/21   ? ?while getting ready for work having difficulty with usual tasks like getting dressed, putting on his belt, some degree of confusin his wife took him to an  [facility] who called EMS. ?EMS found him bradycardic, and was given atropine ?  ?Tracings reviewed are c/w CHB and SB (vs 2:1, difficult to tell), rates 30's-40's ?  ?Cardiology called and admitted with bradycar
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
## 2021-06-10 ENCOUNTER — Encounter (HOSPITAL_COMMUNITY): Payer: Self-pay | Admitting: Radiology

## 2021-06-10 ENCOUNTER — Inpatient Hospital Stay (HOSPITAL_COMMUNITY): Payer: 59

## 2021-06-10 DIAGNOSIS — I63412 Cerebral infarction due to embolism of left middle cerebral artery: Secondary | ICD-10-CM

## 2021-06-10 DIAGNOSIS — I441 Atrioventricular block, second degree: Secondary | ICD-10-CM | POA: Diagnosis not present

## 2021-06-10 DIAGNOSIS — I214 Non-ST elevation (NSTEMI) myocardial infarction: Secondary | ICD-10-CM | POA: Diagnosis not present

## 2021-06-10 DIAGNOSIS — I442 Atrioventricular block, complete: Secondary | ICD-10-CM | POA: Diagnosis not present

## 2021-06-10 LAB — CBC
HCT: 35.8 % — ABNORMAL LOW (ref 39.0–52.0)
Hemoglobin: 11.6 g/dL — ABNORMAL LOW (ref 13.0–17.0)
MCH: 28 pg (ref 26.0–34.0)
MCHC: 32.4 g/dL (ref 30.0–36.0)
MCV: 86.5 fL (ref 80.0–100.0)
Platelets: 151 10*3/uL (ref 150–400)
RBC: 4.14 MIL/uL — ABNORMAL LOW (ref 4.22–5.81)
RDW: 13.7 % (ref 11.5–15.5)
WBC: 8.1 10*3/uL (ref 4.0–10.5)
nRBC: 0 % (ref 0.0–0.2)

## 2021-06-10 LAB — GLUCOSE, CAPILLARY
Glucose-Capillary: 130 mg/dL — ABNORMAL HIGH (ref 70–99)
Glucose-Capillary: 131 mg/dL — ABNORMAL HIGH (ref 70–99)
Glucose-Capillary: 132 mg/dL — ABNORMAL HIGH (ref 70–99)
Glucose-Capillary: 142 mg/dL — ABNORMAL HIGH (ref 70–99)
Glucose-Capillary: 143 mg/dL — ABNORMAL HIGH (ref 70–99)
Glucose-Capillary: 179 mg/dL — ABNORMAL HIGH (ref 70–99)

## 2021-06-10 LAB — BASIC METABOLIC PANEL
Anion gap: 8 (ref 5–15)
BUN: 10 mg/dL (ref 6–20)
CO2: 23 mmol/L (ref 22–32)
Calcium: 8.7 mg/dL — ABNORMAL LOW (ref 8.9–10.3)
Chloride: 109 mmol/L (ref 98–111)
Creatinine, Ser: 0.97 mg/dL (ref 0.61–1.24)
GFR, Estimated: >60 mL/min (ref >60)
Glucose, Bld: 167 mg/dL — ABNORMAL HIGH (ref 70–99)
Potassium: 3.3 mmol/L — ABNORMAL LOW (ref 3.5–5.1)
Sodium: 140 mmol/L (ref 135–145)

## 2021-06-10 LAB — MAGNESIUM: Magnesium: 2.1 mg/dL (ref 1.7–2.4)

## 2021-06-10 LAB — PHOSPHORUS: Phosphorus: 3 mg/dL (ref 2.5–4.6)

## 2021-06-10 LAB — HEPARIN LEVEL (UNFRACTIONATED): Heparin Unfractionated: 0.4 IU/mL (ref 0.30–0.70)

## 2021-06-10 MED ORDER — HYDRALAZINE HCL 10 MG PO TABS
10.0000 mg | ORAL_TABLET | Freq: Two times a day (BID) | ORAL | Status: DC
  Administered 2021-06-10 (×2): 10 mg
  Filled 2021-06-10 (×3): qty 1

## 2021-06-10 MED ORDER — CLEVIDIPINE BUTYRATE 0.5 MG/ML IV EMUL
0.0000 mg/h | INTRAVENOUS | Status: DC
  Administered 2021-06-10 (×3): 7 mg/h via INTRAVENOUS
  Filled 2021-06-10: qty 100
  Filled 2021-06-10 (×2): qty 50
  Filled 2021-06-10 (×3): qty 100

## 2021-06-10 MED ORDER — POTASSIUM CHLORIDE 10 MEQ/100ML IV SOLN
10.0000 meq | INTRAVENOUS | Status: AC
  Administered 2021-06-10 (×4): 10 meq via INTRAVENOUS
  Filled 2021-06-10 (×4): qty 100

## 2021-06-10 MED ORDER — HYDROCHLOROTHIAZIDE 25 MG PO TABS
25.0000 mg | ORAL_TABLET | Freq: Every day | ORAL | Status: DC
  Administered 2021-06-10: 25 mg
  Filled 2021-06-10: qty 1

## 2021-06-10 MED ORDER — POTASSIUM CHLORIDE 20 MEQ PO PACK
20.0000 meq | PACK | ORAL | Status: AC
  Administered 2021-06-10 (×2): 20 meq
  Filled 2021-06-10 (×2): qty 1

## 2021-06-10 MED ORDER — ISOSORBIDE DINITRATE 10 MG PO TABS
10.0000 mg | ORAL_TABLET | Freq: Two times a day (BID) | ORAL | Status: DC
Start: 2021-06-10 — End: 2021-06-11
  Administered 2021-06-10 (×2): 10 mg
  Filled 2021-06-10 (×3): qty 1

## 2021-06-10 NOTE — Progress Notes (Signed)
[facility] ADULT ICU REPLACEMENT PROTOCOL ? ? ?The patient does apply for the [facility] Adult ICU Electrolyte Replacment Protocol based on the criteria listed below:  ? ?1.Exclusion criteria: TCTS patients, ECMO patients, and Dialysis patients ?2. Is GFR >/= 30 ml/min? Yes.    ?Patient's GFR today is >60 ?3. Is SCr </= 2? Yes.   ?Patient's SCr is 0.97 mg/dL ?4. Did SCr increase >/= 0.5 in 24 hours? No. ?5.Pt's weight >40kg  Yes.   ?6. Abnormal electrolyte(s):   K 3.3  ?7. Electrolytes replaced per protocol ?8.  Call MD STAT for K+ </= 2.5, Phos </= 1, or Mag </= 1 ?Physician:  Lona Kettle ? ?Henry Simmons 06/10/2021 5:01 AM. ?

## 2021-06-10 NOTE — Progress Notes (Addendum)
Physical Therapy Treatment ?Patient Details ?Name: Henry Simmons ?MRN: [id number] ?DOB: 1971-02-11 ?Today's Date: 06/10/2021 ? ? ?History of Present Illness 51 yo male presented to ED on 4/25 with a chief complaint of weakness and confusion. Found to be bradycardic in 2nd degree heart block, with elevated trop, EKG suggested recent inferior wall MI vs STEMI.  During the cath he was noted to be agitated, aphasic and weak on the R side.  CT without acute findings, was concern for LVO, so was taken to IR where a temporary Pacer was placed. Intubated 4/25-4/26. CT revealed small old left pericallosal infarct, small old infarct in the anterior left frontal lobe, and old bilateral basal ganglia lacunar infarcts. Repeat CT of head 4/27 revealed moderately large area of infarct in the left posterior frontal lobe extending down to the frontal operculum and insula. PMH including CAD, HTN, stroke 2014, ischemic cm, hyperlipidemia, and tobacco abuse. ? ?  ?PT Comments  ? ? Deferred bed mobility, EOB, or OOB mobility per MD request to limit R hip flexion secondary to R femoral temp pacer location. Thus, focused session on facilitating improved L UE coordination, R UE stimulation to touch, R UE muscular activation, and R attention through performing bed-level exercises and neuromuscular re-education below, see Exercises. Pt continues to display incoordination with poor control of his grip strength on his L and little to no muscle activation in his R UE (questionable R biceps activation noted). Will continue to follow acutely. Current recommendations remain appropriate. ?  ?Recommendations for follow up therapy are one component of a multi-disciplinary discharge planning process, led by the attending physician.  Recommendations may be updated based on patient status, additional functional criteria and insurance authorization. ? ?Follow Up Recommendations ? Acute inpatient rehab (3hours/day) ?  ?  ?Assistance Recommended at Discharge  Frequent or constant Supervision/Assistance  ?Patient can return home with the following Two people to help with walking and/or transfers;Two people to help with bathing/dressing/bathroom;Assistance with cooking/housework;Direct supervision/assist for medications management;Assistance with feeding;Direct supervision/assist for financial management;Assist for transportation;Help with stairs or ramp for entrance ?  ?Equipment Recommendations ? BSC/3in1;Wheelchair (measurements PT);Wheelchair cushion (measurements PT);Hospital bed;Other (comment);Rolling walker (2 wheels) (hoyer lift; pending progress)  ?  ?Recommendations for Other Services   ? ? ?  ?Precautions / Restrictions Precautions ?Precautions: Fall;Other (comment) ?Precaution Comments: fermoral pacer; R neglect (per Dr. Algie CofferKadakia 06/10/21: defer EOB, mobility OOB, and R hip flexion until R femoral pacer is replaced/removed) ?Restrictions ?Weight Bearing Restrictions: No  ?  ? ?Mobility ? Bed Mobility ?  ?  ?  ?  ?  ?  ?  ?General bed mobility comments: deferred from getting to EOB or OOB mobility per MD request ?  ? ?Transfers ?  ?  ?  ?  ?  ?  ?  ?  ?  ?General transfer comment: deferred from getting to EOB or OOB mobility per MD request ?  ? ?Ambulation/Gait ?  ?  ?  ?  ?  ?  ?  ?General Gait Details: deferred from getting to EOB or OOB mobility per MD request ? ? ?Stairs ?  ?  ?  ?  ?  ? ? ?Wheelchair Mobility ?  ? ?Modified Rankin (Stroke Patients Only) ?  ? ? ?  ?Balance   ?  ?  ?  ?  ?  ?  ?  ?  ?  ?  ?  ?  ?  ?  ?  ?  ?  ?  ?  ? ?  ?  Cognition Arousal/Alertness: Awake/alert ?Behavior During Therapy: [facility] for tasks assessed/performed ?Overall Cognitive Status: Difficult to assess ?  ?  ?  ?  ?  ?  ?  ?  ?  ?  ?  ?  ?  ?  ?  ?  ?General Comments: Following one step command and very motivated. Difficult to assess due to aphasia and apraxia ?  ?  ? ?  ?Exercises Other Exercises ?Other Exercises: Coordination exercises performed including: L finger to nose and  back to PT's finger on pt's L side >8x, alternating opening/closing L hand 10x, bringing bil UEs towards own mouth target and back to PT's hand target located towards his lower abdomen in midline 10x with pt holding R UE with L UE ?Other Exercises: AROM of L lower extremity into hip flexion, hip abduction, hip adduction, and ankle circles 1-2x each, monitoring R fem line throughout ?Other Exercises: Facilitated R attention through cuing pt to rotate head to R >10x to find his wife and to track PT throughout session to attend to R UE ?Other Exercises: Facilitated R UE muscle activation through AAROM/PROM into shoulder flexion, elbow flexion, elbow extension with assistance from PT or pt's other hand, 10x each ?Other Exercises: Facilitated R UE stimulation to touch and R UE attention through cuing pt to feel and gently brush R UE with his L UE ? ?  ?General Comments General comments (skin integrity, edema, etc.): VSS on RA; education provided to pt with wife in room on his R side inattention and to rotate head to look to R and start feeling his R UE with his L UE gently ?  ?  ? ?Pertinent Vitals/Pain Pain Assessment ?Pain Assessment: Faces ?Faces Pain Scale: No hurt ?Pain Intervention(s): Monitored during session  ? ? ?Home Living   ?  ?  ?  ?  ?  ?  ?  ?  ?  ?   ?  ?Prior Function    ?  ?  ?   ? ?PT Goals (current goals can now be found in the care plan section) Acute Rehab PT Goals ?Patient Stated Goal: agreeable to session and appears motivated ?PT Goal Formulation: With patient/family ?Time For Goal Achievement: 06/23/21 ?Potential to Achieve Goals: Good ?Progress towards PT goals: Progressing toward goals ? ?  ?Frequency ? ? ? Min 4X/week ? ? ? ?  ?PT Plan Current plan remains appropriate  ? ? ?Co-evaluation   ?  ?  ?  ?  ? ?  ?AM-PAC PT "6 Clicks" Mobility   ?Outcome Measure ? Help needed turning from your back to your side while in a flat bed without using bedrails?: Total ?Help needed moving from lying on your  back to sitting on the side of a flat bed without using bedrails?: Total ?Help needed moving to and from a bed to a chair (including a wheelchair)?: Total ?Help needed standing up from a chair using your arms (e.g., wheelchair or bedside chair)?: Total ?Help needed to walk in hospital room?: Total ?Help needed climbing 3-5 steps with a railing? : Total ?6 Click Score: 6 ? ?  ?End of Session Equipment Utilized During Treatment: Other (comment) (glasses with L eye lens taped) ?Activity Tolerance: Patient tolerated treatment well ?Patient left: in bed;with call bell/phone within reach;with bed alarm set;with family/visitor present ?  ?PT Visit Diagnosis: Muscle weakness (generalized) (M62.81);Difficulty in walking, not elsewhere classified (R26.2);Apraxia (R48.2);Other symptoms and signs involving the nervous system (R29.898) ?  ? ? ?Time:  [id number] ?PT Time Calculation (min) (ACUTE ONLY): 27 min ? ?Charges:  $Therapeutic Exercise: 8-22 mins ?$Neuromuscular Re-education: 8-22 mins          ?          ? ?Raymond Gurney, PT, DPT ?Acute Rehabilitation Services  ?Pager: [phone] ?Office: [phone] ? ? ? ?Henrene Dodge Pettis ?06/10/2021, 3:59 PM ? ?

## 2021-06-10 NOTE — Progress Notes (Signed)
Ref: Orpah Cobb, MD ? ? ?Subjective:  ?Awake. VS stable.  ? ?Objective:  ?Vital Signs in the last 24 hours: ?Temp:  [98.4 ?F (36.9 ?C)-99 ?F (37.2 ?C)] 98.5 ?F (36.9 ?C) (04/28 0800) ?Pulse Rate:  [59-60] 59 (04/28 0800) ?Cardiac Rhythm: Normal sinus rhythm;Ventricular paced (04/28 0752) ?Resp:  [0-31] 0 (04/28 0800) ?BP: (113-143)/(68-92) 140/92 (04/28 0800) ?SpO2:  [94 %-99 %] 96 % (04/28 0800) ?Arterial Line BP: (136-173)/(60-77) 157/69 (04/28 0800) ? ?Physical Exam: ?BP Readings from Last 1 Encounters:  ?06/10/21 (!) 140/92  ?   ?Wt Readings from Last 1 Encounters:  ?06/09/21 86.7 kg  ?  Weight change:  Body mass index is 24.54 kg/m?. ?HEENT: [place]/AT, Eyes-Brown, Conjunctiva-Pink, Sclera-Non-icteric ?Neck: No JVD, No bruit, Trachea midline. ?Lungs:  Clear, Bilateral. ?Cardiac:  Paced, Regular rhythm, normal S1 and S2, no S3. II/VI systolic murmur. ?Abdomen:  Soft, non-tender. BS present. ?Extremities:  No edema present. No cyanosis. No clubbing. ?CNS: AxOx2  Right sided weakness continues., .  ?Skin: Warm and dry. ? ? ?Intake/Output from previous day: ?04/27 0701 - 04/28 0700 ?In: 2053.4 [P.O.:350; I.V.:669.6; NG/GT:633; IV Piggyback:400.8] ?Out: 1550 [Urine:1550] ? ? ? ?Lab Results: ?BMET ?   ?Component Value Date/Time  ? NA 140 06/10/2021 0327  ? NA 140 06/09/2021 0440  ? NA 140 06/08/2021 0501  ? K 3.3 (L) 06/10/2021 0327  ? K 3.6 06/09/2021 0440  ? K 3.5 06/08/2021 0501  ? CL 109 06/10/2021 0327  ? CL 109 06/09/2021 0440  ? CL 109 06/08/2021 0501  ? CO2 23 06/10/2021 0327  ? CO2 23 06/09/2021 0440  ? CO2 21 (L) 06/08/2021 0501  ? GLUCOSE 167 (H) 06/10/2021 0327  ? GLUCOSE 142 (H) 06/09/2021 0440  ? GLUCOSE 92 06/08/2021 0501  ? BUN 10 06/10/2021 0327  ? BUN 14 06/09/2021 0440  ? BUN 19 06/08/2021 0501  ? CREATININE 0.97 06/10/2021 0327  ? CREATININE 1.04 06/09/2021 0440  ? CREATININE 1.21 06/08/2021 0501  ? CALCIUM 8.7 (L) 06/10/2021 0327  ? CALCIUM 8.7 (L) 06/09/2021 0440  ? CALCIUM 8.9 06/08/2021 0501  ?  GFRNONAA >60 06/10/2021 0327  ? GFRNONAA >60 06/09/2021 0440  ? GFRNONAA >60 06/08/2021 0501  ? GFRAA 79 (L) 10/25/2012 6962  ? GFRAA 86 (L) 10/24/2012 1954  ? GFRAA 90 (L) 10/24/2012 0957  ? ?CBC ?   ?Component Value Date/Time  ? WBC 8.1 06/10/2021 0327  ? RBC 4.14 (L) 06/10/2021 0327  ? HGB 11.6 (L) 06/10/2021 0327  ? HCT 35.8 (L) 06/10/2021 0327  ? PLT 151 06/10/2021 0327  ? MCV 86.5 06/10/2021 0327  ? MCH 28.0 06/10/2021 0327  ? MCHC 32.4 06/10/2021 0327  ? RDW 13.7 06/10/2021 0327  ? LYMPHSABS 0.7 06/08/2021 0501  ? MONOABS 0.9 06/08/2021 0501  ? EOSABS 0.0 06/08/2021 0501  ? BASOSABS 0.0 06/08/2021 0501  ? ?HEPATIC Function Panel ?Recent Labs  ?  11/29/20 ?1258 06/07/21 ?0903  ?PROT 7.7 6.8  ? ?HEMOGLOBIN A1C ?No components found for: HGA1C,  MPG ?CARDIAC ENZYMES ?Lab Results  ?Component Value Date  ? CKTOTAL 319 (H) 09/18/2010  ? CKMB 2.0 09/18/2010  ? TROPONINI <0.30 10/24/2012  ? TROPONINI <0.30 09/15/2012  ? TROPONINI <0.30 09/18/2010  ? ?BNP ?No results for input(s): PROBNP in the last 8760 hours. ?TSH ?Recent Labs  ?  11/30/20 ?0305  ?TSH 1.016  ? ?CHOLESTEROL ?Recent Labs  ?  11/30/20 ?0305 06/08/21 ?0501  ?CHOL 157 173  ? ? ?Scheduled Meds: ? amLODipine  10 mg Per Tube Daily  ? vitamin C  500 mg Per Tube Daily  ? aspirin  81 mg Per Tube Daily  ? atorvastatin  80 mg Per Tube Daily  ? chlorhexidine  15 mL Mouth Rinse BID  ? Chlorhexidine Gluconate Cloth  6 each Topical Daily  ? feeding supplement (PROSource TF)  45 mL Per Tube BID  ? hydrALAZINE  10 mg Oral BID  ? hydrochlorothiazide  25 mg Per Tube Daily  ? insulin aspart  0-15 Units Subcutaneous Q4H  ? isosorbide dinitrate  10 mg Oral BID  ? losartan  100 mg Per Tube Daily  ? mouth rinse  15 mL Mouth Rinse BID  ? pantoprazole sodium  40 mg Per Tube Daily  ? sodium chloride flush  3 mL Intravenous Q12H  ? ?Continuous Infusions: ? sodium chloride    ? sodium chloride    ? clevidipine 7 mg/hr (06/10/21 0956)  ? feeding supplement (OSMOLITE 1.5 CAL) 60  mL/hr at 06/09/21 2000  ? heparin 1,500 Units/hr (06/10/21 0956)  ? ?PRN Meds:.sodium chloride, sodium chloride, acetaminophen **OR** acetaminophen (TYLENOL) oral liquid 160 mg/5 mL **OR** acetaminophen, nitroGLYCERIN, ondansetron (ZOFRAN) IV, sodium chloride flush ? ?Assessment/Plan: ? NSTEMI ?Stroke with right hemiplegia ?3rd. Degree heart block ?CAD ?S/P LAD stent ?HTN ?HLD ?HCM ?PVD ? ?Plan: ?Add hydralazine and isosorbide. ?Appreciate EP and neurology consult. ? ? LOS: 3 days  ? ?Time spent including chart review, lab review, examination, discussion with patient.Nurse/Doctor : 30 min ? ? ?Orpah Cobb  MD  ?06/10/2021, 10:29 AM ? ? ? ? ?
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
## 2021-06-10 NOTE — Progress Notes (Addendum)
? ?Progress Note ? ?Patient Name: Henry Simmons ?Date of Encounter: 06/10/2021 ? ?[facility] Cardiologist: Dr. Doylene Canard ? ?Subjective  ? ?Awake, alert, aphasic  ? ?Inpatient Medications  ?  ?Scheduled Meds: ? amLODipine  10 mg Per Tube Daily  ? vitamin C  500 mg Per Tube Daily  ? aspirin  81 mg Per Tube Daily  ? atorvastatin  80 mg Per Tube Daily  ? chlorhexidine  15 mL Mouth Rinse BID  ? Chlorhexidine Gluconate Cloth  6 each Topical Daily  ? feeding supplement (PROSource TF)  45 mL Per Tube BID  ? hydrochlorothiazide  25 mg Per Tube Daily  ? insulin aspart  0-15 Units Subcutaneous Q4H  ? losartan  100 mg Per Tube Daily  ? mouth rinse  15 mL Mouth Rinse BID  ? pantoprazole sodium  40 mg Per Tube Daily  ? potassium chloride  20 mEq Per Tube Q4H  ? sodium chloride flush  3 mL Intravenous Q12H  ? ?Continuous Infusions: ? sodium chloride    ? sodium chloride    ? clevidipine 7 mg/hr (06/10/21 0600)  ? feeding supplement (OSMOLITE 1.5 CAL) 60 mL/hr at 06/09/21 2000  ? heparin 1,500 Units/hr (06/10/21 0600)  ? potassium chloride 10 mEq (06/10/21 0730)  ? ?PRN Meds: ?sodium chloride, sodium chloride, acetaminophen **OR** acetaminophen (TYLENOL) oral liquid 160 mg/5 mL **OR** acetaminophen, nitroGLYCERIN, ondansetron (ZOFRAN) IV, sodium chloride flush  ? ?Vital Signs  ?  ?Vitals:  ? 06/10/21 0400 06/10/21 0500 06/10/21 0600 06/10/21 0700  ?BP: (!) 143/71     ?Pulse: 60 60 60   ?Resp: (!) 8 (!) 24 (!) 29   ?Temp:    98.5 ?F (36.9 ?C)  ?TempSrc:    Oral  ?SpO2: 94% 98% 97%   ?Weight:      ?Height:      ? ? ?Intake/Output Summary (Last 24 hours) at 06/10/2021 0759 ?Last data filed at 06/10/2021 0600 ?Gross per 24 hour  ?Intake 2053.39 ml  ?Output 1550 ml  ?Net 503.39 ml  ? ? ?  06/09/2021  ?  4:00 AM 06/07/2021  ?  8:42 AM 12/01/2020  ?  4:26 AM  ?Last 3 Weights  ?Weight (lbs) 191 lb 2.2 oz 229 lb 4.5 oz 180 lb 8.9 oz  ?Weight (kg) 86.7 kg 104 kg 81.9 kg  ?   ? ?Telemetry  ?  ?V pacing, CHB underlying - Personally  Reviewed ? ?ECG  ?  ?No new EKG - Personally Reviewed ? ?Physical Exam  ? ?GEN: No acute distress   ?Neck: No JVD ?Cardiac: RRR, no murmurs, rubs, or gallops.  ?Respiratory: CTA b/l. ?GI: Soft, nontender, non-distended  ?MS: No edema; No deformity. ?Neuro:  AA, following commands with L side,  still no movement on R ?Neuro reports c/o diplopia ?Psych: difficult to assess, though very cooperative ? ?Labs  ?  ?High Sensitivity Troponin:   ?Recent Labs  ?Lab 06/07/21 ?0903 06/07/21 ?1100  ?TROPONINIHS [id number]* 12,571*  ?   ?Chemistry ?Recent Labs  ?Lab 06/07/21 ?0903 06/07/21 ?2159 06/08/21 ?0501 06/08/21 ?1630 06/09/21 ?0440 06/09/21 ?1718 06/10/21 ?0327  ?NA 140   < > 140  --  140  --  140  ?K 4.0   < > 3.5  --  3.6  --  3.3*  ?CL 107  --  109  --  109  --  109  ?CO2 24  --  21*  --  23  --  23  ?GLUCOSE 119*  --  92  --  142*  --  167*  ?BUN 18  --  19  --  14  --  10  ?CREATININE 1.51*  --  1.21  --  1.04  --  0.97  ?CALCIUM 9.3  --  8.9  --  8.7*  --  8.7*  ?MG 2.2  --   --    < > 2.3 2.2 2.1  ?PROT 6.8  --   --   --   --   --   --   ?ALBUMIN 3.7  --   --   --   --   --   --   ?AST 81*  --   --   --   --   --   --   ?ALT 20  --   --   --   --   --   --   ?ALKPHOS 46  --   --   --   --   --   --   ?BILITOT 1.3*  --   --   --   --   --   --   ?GFRNONAA 55*  --  >60  --  >60  --  >60  ?ANIONGAP 9  --  10  --  8  --  8  ? < > = values in this interval not displayed.  ?  ?Lipids  ?Recent Labs  ?Lab 06/08/21 ?0501  ?CHOL 173  ?TRIG 171*  175*  ?HDL 21*  ?LDLCALC 118*  ?CHOLHDL 8.2  ?  ?Hematology ?Recent Labs  ?Lab 06/08/21 ?0501 06/09/21 ?0440 06/10/21 ?0327  ?WBC 10.1 9.7 8.1  ?RBC 4.12* 4.07* 4.14*  ?HGB 11.6* 11.7* 11.6*  ?HCT 36.1* 35.1* 35.8*  ?MCV 87.6 86.2 86.5  ?MCH 28.2 28.7 28.0  ?MCHC 32.1 33.3 32.4  ?RDW 14.2 14.0 13.7  ?PLT 149* 130* 151  ? ?Thyroid No results for input(s): TSH, FREET4 in the last 168 hours.  ?BNPNo results for input(s): BNP, PROBNP in the last 168 hours.  ?DDimer No results for input(s):  DDIMER in the last 168 hours.  ? ?Radiology  ?  ?Cardiac Studies  ? ?06/07/21: TTE ?IMPRESSIONS  ? 1. Left ventricular ejection fraction, by estimation, is 40 to 45%. The  ?left ventricle has mildly decreased function. The left ventricle  ?demonstrates regional wall motion abnormalities (see scoring  ?diagram/findings for description). There is severe  ?concentric left ventricular hypertrophy. Left ventricular diastolic  ?parameters are consistent with Grade I diastolic dysfunction (impaired  ?relaxation). There is severe hypokinesis of the left ventricular, entire  ?inferior wall and inferoseptal wall.  ? 2. Right ventricular systolic function is normal. The right ventricular  ?size is normal. There is normal pulmonary artery systolic pressure.  ? 3. Left atrial size was mildly dilated.  ? 4. Right atrial size was mildly dilated.  ? 5. The mitral valve is normal in structure. Moderate mitral valve  ?regurgitation.  ? 6. The aortic valve is tricuspid. Aortic valve regurgitation is not  ?visualized.  ? 7. The inferior vena cava is normal in size with <50% respiratory  ?variability, suggesting right atrial pressure of 8 mmHg.  ? 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 Cardiac interventions.  ?  ?  ?06/07/2021: aborted LHC ?  Mid LM to Dist LM lesion is 25% stenosed. ?  Ramus lesion is 80% stenosed. ?  2nd Diag lesion is 20% stenosed. ?  Prox LAD lesion is 60% stenosed. ?  Dist  LAD lesion is 80% stenosed. ?  1st Mrg lesion is 80% stenosed. ?  Previously placed Mid LAD stent (unknown type) is  widely patent. ?  The left ventricular ejection fraction is 35-45% by visual estimate. ?  ?Stroke work up in progress. ?Per wife patient had stroke symptoms wax and wane since this morning prior to arrival to ER. ?Medical treatment for now. ?  ?Cath: 11/30/2020 ?  Mid LM to Dist LM lesion is 25% stenosed. ?  Mid LAD lesion is 90% stenosed. ?  2nd Diag lesion is 20% stenosed. ?  1st Mrg  lesion is 100% stenosed. ?  Mid Cx to Dist Cx lesion is 100% stenosed. ?  Prox RCA lesion is 50% stenosed. ?  Mid RCA to Dist RCA lesion is 40% stenosed. ?  Dist RCA lesion is 65% stenosed. ?  Ramus lesion is 80% stenosed. ?  A drug-eluting stent was successfully placed using a STENT ONYX FRONTIER [id number]. ?  Post intervention, there is a 0% residual stenosis. ?  LV end diastolic pressure is normal. ?  ?3 vessel CAD. The culprit lesion is the first OM which is occluded. The distal LCx is also occluded. There is a segmental 90% mid LAD stenosis. 80% ramus intermediate which is small to moderate in size. 65% distal RCA. ?Normal LVEDP ?Successful PCI of the mid LAD with 2.75 x 38 mm Onyx Frontier with IVUS guidance ?  ?Plan: DAPT for one year. Aggressive risk factor modification. Siaosi Alter treat residual disease medically. Anticipate DC tomorrow.  ?  ?  ?Echo: 11/29/20 ?IMPRESSIONS  ? 1. Consider cardiac amyloid.  ? 2. Left ventricular ejection fraction, by estimation, is 35 to 40%. The  ?left ventricle has severely decreased function. The left ventricle  ?demonstrates global hypokinesis. There is severe concentric left  ?ventricular hypertrophy. Left ventricular  ?diastolic parameters are consistent with Grade II diastolic dysfunction  ?(pseudonormalization).  ? 3. Right ventricular systolic function is normal. The right ventricular  ?size is normal.  ? 4. The pericardial effusion is anterior to the right ventricle.  ? 5. The mitral valve is normal in structure. Trivial mitral valve  ?regurgitation. No evidence of mitral stenosis.  ? 6. The aortic valve is normal in structure. Aortic valve regurgitation is  ?not visualized. No aortic stenosis is present.  ? 7. The inferior vena cava is normal in size with greater than 50%  ?respiratory variability, suggesting right atrial pressure of 3 mmHg.  ? 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  ? ?Patient Profile  ?   ?51 y.o. male  HTN, prior strokes (2014. 2015), HLD, smoker, CAD admitted 05/07/21   ? ?while getting ready for work having difficulty with usual tasks like getting dressed, putting on his belt, some degree of confusin his wife too
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
## 2021-06-10 NOTE — Progress Notes (Signed)
ANTICOAGULATION CONSULT NOTE - Follow-up Consult ? ?Pharmacy Consult for heparin ?Indication: chest pain/ACS ? ?No Known Allergies ? ?Patient Measurements: ?Height: 6\' 2"  (188 cm) ?Weight: 86.7 kg (191 lb 2.2 oz) ?IBW/kg (Calculated) : 82.2 ?Heparin Dosing Weight: 103.1 kg  ? ?Vital Signs: ?Temp: 98.4 ?F (36.9 ?C) (04/28 01-30-1970) ?Temp Source: Oral (04/28 01-30-1970) ?BP: 143/71 (04/28 0400) ?Pulse Rate: 60 (04/28 0600) ? ?Labs: ?Recent Labs  ?  06/07/21 ?0903 06/07/21 ?1100 06/07/21 ?2159 06/08/21 ?0501 06/08/21 ?2117 06/09/21 ?0440 06/10/21 ?0327  ?HGB 13.2  --    < > 11.6*  --  11.7* 11.6*  ?HCT 41.5  --    < > 36.1*  --  35.1* 35.8*  ?PLT 147*  --   --  149*  --  130* 151  ?HEPARINUNFRC  --   --   --   --  0.29* 0.32 0.40  ?CREATININE 1.51*  --   --  1.21  --  1.04 0.97  ?TROPONINIHS 06/12/21* 12,571*  --   --   --   --   --   ? < > = values in this interval not displayed.  ? ? ? ?Estimated Creatinine Clearance: 103.6 mL/min (by C-G formula based on SCr of 0.97 mg/dL). ? ? ?Assessment: ?70 YOM with NSTEMI and likely AIS. Will target a more conservative heparin level due to probable stroke.  ? ?Heparin level 0.4 on 1,500 units/hr. CBC stable. No bleeding or issues with infusion noted.  ? ?Goal of Therapy:  ?Heparin level 0.3-0.5 units/mL ?Monitor platelets by anticoagulation protocol: Yes ?  ?Plan:  ?Continue heparin at 1,500 units/hr  ?Daily HL and CBC  ? ?44 PharmD., BCPS ?Clinical Pharmacist ?06/10/2021 7:32 AM ? ? ? ? ?

## 2021-06-10 NOTE — Progress Notes (Addendum)
Occupational Therapy Treatment ?Patient Details ?Name: Henry Simmons ?MRN: [id number] ?DOB: 1970/11/16 ?Today's Date: 06/10/2021 ? ? ?History of present illness 51 yo male presented to ED on 4/25 with a chief complaint of weakness and confusion. Found to be bradycardic in 2nd degree heart block, with elevated trop, EKG suggested recent inferior wall MI vs STEMI.  During the cath he was noted to be agitated, aphasic and weak on the R side.  CT without acute findings, was concern for LVO, so was taken to IR where a temporary Pacer was placed. Intubated 4/25-4/26. CT revealed small old left pericallosal infarct, small old infarct in the anterior left frontal lobe, and old bilateral basal ganglia lacunar infarcts. Repeat CT of head 4/27 revealed moderately large area of infarct in the left posterior frontal lobe extending down to the frontal operculum and insula. PMH including CAD, HTN, stroke 2014, ischemic cm, hyperlipidemia, and tobacco abuse. ?  ?OT comments ? Focused session on vision, neglect/inattention, and coordination through exercises and self feeding. Pt performing targeted reach task with stacking cups; increasing challenge to stack on R side of visual field. Requiring Min- Mod cues for sequencing and attending. Pt performing self feeding with Min A for hand over hand and stabilizing plate positioning. Cues for attending to R and slowing down to take small bites. Occlusion glasses continue to decrease diplopia and pt has been wearing them in waking hours without complaint. Pt highly motivated and has good family support. Continue to highly recommend dc to AIR and will continue to follow acutely as admitted.    ? ?Recommendations for follow up therapy are one component of a multi-disciplinary discharge planning process, led by the attending physician.  Recommendations may be updated based on patient status, additional functional criteria and insurance authorization. ?   ?Follow Up Recommendations ? Acute  inpatient rehab (3hours/day)  ?  ?Assistance Recommended at Discharge Frequent or constant Supervision/Assistance  ?Patient can return home with the following ?   ?  ?Equipment Recommendations ?  (Defer ot next venue)  ?  ?Recommendations for Other Services Rehab consult;PT consult;Speech consult ? ?  ?Precautions / Restrictions Precautions ?Precautions: Fall;Other (comment) ?Precaution Comments: fermoral pacer; R neglect ?Restrictions ?Weight Bearing Restrictions: No  ? ? ?  ? ?Mobility Bed Mobility ?  ?  ?  ?  ?  ?  ?  ?  ?  ? ?Transfers ?  ?  ?  ?  ?  ?  ?  ?  ?  ?  ?  ?  ?Balance   ?  ?  ?  ?  ?  ?  ?  ?  ?  ?  ?  ?  ?  ?  ?  ?  ?  ?  ?   ? ?ADL either performed or assessed with clinical judgement  ? ?ADL Overall ADL's : Needs assistance/impaired ?Eating/Feeding: Minimal assistance;Bed level ?Eating/Feeding Details (indicate cue type and reason): Adapting bed position for reverse trendelenburg to elevate trunk without flexing at hips. placing plate at midline (off table) and cuing pt to use LUE for self feeding. Pt demonstrating possible R field cut as he did not attend to food on R side of plate and required plate to be turn to eat certain foods. Pt requiring cues for smaller bite. Min hand over hand for bringing cup to mouth. Also noting poor proprioception to bring spoon to mouth and overshooting to R; however, this improved greatly with repetitions. ?Grooming: Wash/dry hands;Set up;Bed level ?Grooming  Details (indicate cue type and reason): cues for washing face. ?  ?  ?  ?  ?  ?  ?  ?  ?  ?  ?  ?  ?  ?  ?  ?General ADL Comments: Focused session on theraputic activity for vision and attention and self feeding ?  ? ?Extremity/Trunk Assessment Upper Extremity Assessment ?Upper Extremity Assessment: RUE deficits/detail;LUE deficits/detail ?RUE Deficits / Details: No active movement ?LUE Deficits / Details: Apraxia. demonstrating improve coordaintion for targeted reach with increased prepatitions in TA and self  feeding ?LUE Coordination: decreased gross motor ?  ?Lower Extremity Assessment ?Lower Extremity Assessment: Defer to PT evaluation ?  ?  ?  ? ?Vision   ?Vision Assessment?: Yes ?Eye Alignment: Impaired (comment) ?Alignment/Gaze Preference: Gaze left;Head turned ?Tracking/Visual Pursuits: Decreased smoothness of horizontal tracking;Decreased smoothness of vertical tracking ?Convergence: Impaired (comment) ?Visual Fields: Right visual field deficit ?Diplopia Assessment: Other (comment) (Continues to close L eye when occlusion glasses are off. Confirming glasses improve diplopia) ?Additional Comments: Continued diplopia that is improved with occlusion glasses ?  ?Perception   ?  ?Praxis   ?  ? ?Cognition Arousal/Alertness: Awake/alert ?Behavior During Therapy: [facility] for tasks assessed/performed ?Overall Cognitive Status: Difficult to assess ?  ?  ?  ?  ?  ?  ?  ?  ?  ?  ?  ?  ?  ?  ?  ?  ?General Comments: Following one step command and very motivated. Difficult to assess due to aphasia and apraxia ?  ?  ?   ?Exercises Exercises: Other exercises ?Other Exercises ?Other Exercises: Coordiantion exercises: pt reaching and grabbing cup from therapist and then placing into another cup. Alteranting between reaching to R and L and then placing on opposite side ?Other Exercises: Pt reaching and grabbing three different cups (one on L, one at midline, and one on R) and then stacking into other cups. repeating x10. Cues for sequencing of LUE for improve normalized movement patterns. Pt with decreased cooridnation to R; but improving with repetitions ? ?  ?Shoulder Instructions   ? ? ?  ?General Comments VSS on RA. Wife present  ? ? ?Pertinent Vitals/ Pain       Pain Assessment ?Pain Assessment: Faces ?Faces Pain Scale: No hurt ?Pain Intervention(s): Monitored during session, Limited activity within patient's tolerance, Repositioned ? ?Home Living   ?  ?  ?  ?  ?  ?  ?  ?  ?  ?  ?  ?  ?  ?  ?  ?  ?  ?  ? ?  ?Prior  Functioning/Environment    ?  ?  ?  ?   ? ?Frequency ? Min 3X/week  ? ? ? ? ?  ?Progress Toward Goals ? ?OT Goals(current goals can now be found in the care plan section) ? Progress towards OT goals: Progressing toward goals ? ?Acute Rehab OT Goals ?OT Goal Formulation: With patient ?Time For Goal Achievement: 06/23/21 ?Potential to Achieve Goals: Good ?ADL Goals ?Additional ADL Goal #1: Pt will demonstrate sustained attention to follow two step ADL with MIn cues ?Additional ADL Goal #2: Pt will perform bed mobility with Mod A +2 ?Additional ADL Goal #3: Wife will independent verbalize use of occlusion glasses in preparation for ADLs  ?Plan Discharge plan remains appropriate   ? ?Co-evaluation ? ? ?   ?  ?  ?  ?  ? ?  ?AM-PAC OT "6 Clicks" Daily Activity     ?  Outcome Measure ? ? Help from another person eating meals?: A Little ?Help from another person taking care of personal grooming?: A Little ?Help from another person toileting, which includes using toliet, bedpan, or urinal?: A Lot ?Help from another person bathing (including washing, rinsing, drying)?: A Lot ?Help from another person to put on and taking off regular upper body clothing?: A Lot ?Help from another person to put on and taking off regular lower body clothing?: Total ?6 Click Score: 13 ? ?  ?End of Session   ? ?OT Visit Diagnosis: Unsteadiness on feet (R26.81);Other abnormalities of gait and mobility (R26.89);Muscle weakness (generalized) (M62.81) ?  ?Activity Tolerance Patient tolerated treatment well ?  ?Patient Left in bed;with call bell/phone within reach;with nursing/sitter in room ?  ?Nurse Communication Mobility status ?  ? ?   ? ?Time: 2130-8657 ?OT Time Calculation (min): 34 min ? ?Charges: OT General Charges ?$OT Visit: 1 Visit ?OT Treatments ?$Self Care/Home Management : 8-22 mins ?$Therapeutic Activity: 8-22 mins ? ?Kayle Passarelli MSOT, OTR/L ?Acute Rehab ?Pager: [phone] ?Office: [phone] ? ?Tysheem Accardo M Jalacia Mattila ?06/10/2021, 5:07  PM ? ? ?

## 2021-06-10 NOTE — Progress Notes (Signed)
STROKE TEAM PROGRESS NOTE  ? ?INTERVAL HISTORY ?Patient is sitting up in bed..  Continues to have dense right hemiplegia and now is complaining of diplopia.  He is wearing an eye patch to help with diplopia.  Repeat CT scan of the head shows left frontal and insular infarct without significant cytotoxic edema or midline shift.  No right brain infarct or brainstem infarct is noted.  He is on IV heparin drip.  He has a temporary pacemaker and plans are for permanent pacemaker his heart rate does not improve till next week.  He has been seen by therapy who recommended inpatient rehab. ?Vitals:  ? 06/10/21 1045 06/10/21 1100 06/10/21 1108 06/10/21 1115  ?BP:      ?Pulse: 60 60  60  ?Resp: (!) 34 (!) 32  (!) 27  ?Temp:   98.1 ?F (36.7 ?C)   ?TempSrc:   Oral   ?SpO2: 95% 96%  95%  ?Weight:      ?Height:      ? ?CBC:  ?Recent Labs  ?Lab 06/07/21 ?0903 06/07/21 ?2159 06/08/21 ?0501 06/09/21 ?0440 06/10/21 ?0327  ?WBC 9.6  --  10.1 9.7 8.1  ?NEUTROABS 7.1  --  8.4*  --   --   ?HGB 13.2   < > 11.6* 11.7* 11.6*  ?HCT 41.5   < > 36.1* 35.1* 35.8*  ?MCV 88.9  --  87.6 86.2 86.5  ?PLT 147*  --  149* 130* 151  ? < > = values in this interval not displayed.  ? ?Basic Metabolic Panel:  ?Recent Labs  ?Lab 06/09/21 ?0440 06/09/21 ?1718 06/10/21 ?0327  ?NA 140  --  140  ?K 3.6  --  3.3*  ?CL 109  --  109  ?CO2 23  --  23  ?GLUCOSE 142*  --  167*  ?BUN 14  --  10  ?CREATININE 1.04  --  0.97  ?CALCIUM 8.7*  --  8.7*  ?MG 2.3 2.2 2.1  ?PHOS 2.2* 2.9 3.0  ? ?Lipid Panel:  ?Recent Labs  ?Lab 06/08/21 ?0501  ?CHOL 173  ?TRIG 171*  175*  ?HDL 21*  ?CHOLHDL 8.2  ?VLDL 34  ?LDLCALC 118*  ? ?HgbA1c:  ?Recent Labs  ?Lab 06/08/21 ?0501  ?HGBA1C 5.3  ? ?Urine Drug Screen: No results for input(s): LABOPIA, COCAINSCRNUR, LABBENZ, AMPHETMU, THCU, LABBARB in the last 168 hours.  ?Alcohol Level No results for input(s): ETH in the last 168 hours. ? ?IMAGING past 24 hours ?CT HEAD WO CONTRAST (5MM) ? ?Result Date: 06/09/2021 ?CLINICAL DATA:  Stroke  follow-up.  Post left MCA thrombolysis. EXAM: CT HEAD WITHOUT CONTRAST TECHNIQUE: Contiguous axial images were obtained from the base of the skull through the vertex without intravenous contrast. RADIATION DOSE REDUCTION: This exam was performed according to the departmental dose-optimization program which includes automated exposure control, adjustment of the mA and/or kV according to patient size and/or use of iterative reconstruction technique. COMPARISON:  CT head 06/07/2021.  Limited MRI head 06/08/2021 FINDINGS: Brain: Interval development of a wedge-shaped hypodensity in the left posterior frontal lobe. This extends down to the insula. This is compatible with a moderately large acute infarct. No associated hemorrhagic transformation. No acute hemorrhage. Extensive white matter hypodensity diffusely compatible with chronic ischemia. Chronic cortical infarct in the left medial frontal lobe unchanged. Ventricle size normal.  No midline shift Vascular: Negative for hyperdense vessel Skull: Negative Sinuses/Orbits: Mild mucosal edema right sphenoid sinus. Remaining sinuses clear. Negative orbit Other: None IMPRESSION: Moderately large area of  infarct in the left posterior frontal lobe extending down to the frontal operculum and insula. Infarct territory similar to that seen on the MRI yesterday. No hemorrhagic transformation identified. Extensive chronic microvascular ischemic change in the white matter. Electronically Signed   By: Marlan Palau M.D.   On: 06/09/2021 16:03   ? ?PHYSICAL EXAM ? ?Physical Exam  ?Constitutional: Appears well-developed and well-nourished middle-aged African-American male.  ?Cardiovascular: Normal rate and regular rhythm.  ?Respiratory: Effort normal, non-labored breathing ? ?Neuro: ?Mental Status: ?Patient is awake, following commands with left upper and lower extremities ?Dense right hemiplegia.  Eyes are slightly dysconjugate with left eye hypertropia.  Slight restriction of  up-and-down gaze.  Right gaze paresis.  Saccadic dysmetria.  Voice is extremely hypophonic and difficult to understand.  Follows commands well on the left side.  Mild right-sided neglect ?Cranial Nerves: ?II:  Pupils are equal, round, and reactive to light.   ?III,IV, VI: Left gaze preference, does not cross midline to the right.  Disconjugate. ?V:  ?VII:  ?VIII: Hearing is intact to voice ?X: Cough and gag intact ?XI: Head turned to the left ?XII:   ?Motor: ?Tone is normal. Bulk is normal.  ?LUE 5/5 RUE 0/5 ?LLE 4/5 RLE 0/5 ?Sensory: ?Withdraws to pain in left lower and upper extremity, no movement noted in right upper and lower extremity ? ? ?ASSESSMENT/PLAN ?[name] is a 51 y.o. male with history of  CAD (last stent 10/22), htn, stroke 2014, ischemic cm, hyperlipidemia, and tobacco abuse, presented with a chief complaint of weakness, bradycardia, and confusion. He was initially taken to the Cath Lab, during the cath he was became agitated and had right side weakness and aphasia.  Code stroke was called in Cath Lab, concern for LVO and patient was emergently taken to IR.  Temporary pacemaker was placed by cardiology and IR, stenosis noted on cerebral angiogram however no intervention was done. ?He remains intubated postprocedure with plans to extubate today.  Plan for MRI once permanent pacemaker is placed as temporary pacemaker is not MRI compatible. ? ?Stroke: Moderate size left MCA   and suspected brainstem infarcts likely secondary cardiac catheterization in the setting of extensive intracranial stenosis, complete heart block and recent myocardial infarction ?Code Stroke CT head No acute abnormality.  Vascular enhancement due to cardiac cath ?CTA head & neck There is lack of significant contrast enhancement of the intracranial and extracranial vessels. ?Cerebral angio  ?1 .  Extensive moderate to severe diffuse intracranial arteriosclerotic disease involving the middle cerebral arteries the anterior  cerebral arteries and the posterior circulation ?2.  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  Severe proximal basilar artery stenoses. ?4.  approximately 50 to 70% stenosis of the left internal carotid artery supraclinoid segment ?5.  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 ?7.  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 ?2D Echo EF is 40-45%, decreased LV function, mildly dilated left and right atrial size ?LDL 118 ?HgbA1c 5.3 ?VTE prophylaxis - Heparin IV ?   ?Diet  ? DIET DYS 3 Room service appropriate? Yes; Fluid consistency: Nectar Thick  ? ?Aspirin 81 mg and brilinta 90mg  BID prior to admission, now on heparin IV ?Plavix also listed as home med ?Therapy recommendations:  Pending ?Disposition: Pending ? ?Hypertension ?Home meds:  Coreg, losartan ?Stable ?BP goal 140-160 ? ?Hyperlipidemia ?LDL 118, goal < 70 ?Atorvastatin 80mg   ?Continue statin at discharge ? ?Other Stroke Risk Factors ?Coronary artery disease ?Proximal LAD is 60% stenosis, distal LAD is 80% stenosis.  Previous mid LAD stent is patent. 1st Mrg is 80% stenosed ?Congestive systolic heart failure ?EF 35-45% ? ?Other Active Problems ?Acute inferior wall NSTEMI with second-degree heart block ?Severe symptomatic bradycardia ?Previously on aspirin and Brilinta-currently on hold for pacemaker placement, CCM initiated heparin drip ?Complete heart block requiring transvenous pacing ?Plan for permanent pacemaker with cardiology ?Acute kidney injury ?Postop respiratory insufficiency ?CCM to manage ventilator ?Plan for extubation today ?Currently on Precedex for sedation ? ?Hospital day # 3 ? ?Patient presented with confusion and weakness and was found to be bradycardic with heart block and  elevated troponin so underwent emergent cardiac catheterization which showed no lesion which needed to be intervened upon but during the procedure noted to be aphasic with right hemiparesis.  Emergent cerebral c

## 2021-06-10 NOTE — TOC Initial Note (Signed)
Transition of Care (TOC) - Initial/Assessment Note  ? ? ?Patient Details  ?Name: Henry Simmons ?MRN: [id number] ?Date of Birth: 1970/10/24 ? ?Transition of Care (TOC) CM/SW Contact:    ?Graves-Bigelow, Ocie Cornfield, RN ?Phone Number: ?06/10/2021, 2:00 PM ? ?Clinical Narrative:  Case Manager is following for disposition needs. CIR is following the patient for tolerance and how the patient progresses.               ? ? ?Expected Discharge Plan: [place] ?Barriers to Discharge: Continued Medical Work up ? ? ?Expected Discharge Plan and Services ?Expected Discharge Plan: [place] ?  ?Discharge Planning Services: CM Consult ?Post Acute Care Choice: IP Rehab ?Living arrangements for the past 2 months: [place] ?                ? ?Prior Living Arrangements/Services ?Living arrangements for the past 2 months: [place] ?Lives with:: Spouse ?Patient language and need for interpreter reviewed:: Yes ?       ?Need for Family Participation in Patient Care: Yes (Comment) ?Care giver support system in place?: Yes (comment) ?  ?Criminal Activity/Legal Involvement Pertinent to Current Situation/Hospitalization: No - Comment as needed ?  ?  ?Alcohol / Substance Use: Not Applicable ?Psych Involvement: No (comment) ? 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 Diagnosis Date Noted  ? Cerebral embolism with cerebral infarction 06/09/2021  ? NSTEMI (non-ST elevated myocardial infarction) ([place]) 06/07/2021  ? 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 Heart block AV second degree   ? Ventilator dependence ([place])   ? Ischemic cardiomyopathy 12/01/2020  ? Non-ST elevation (NSTEMI) myocardial infarction ([place]) 11/29/2020  ? CVA (cerebral infarction) 10/24/2012  ? Dyslipidemia 10/24/2012  ? Essential hypertension, malignant 09/15/2012  ? Right sided weakness  09/15/2012  ? History of CVA (cerebrovascular accident) without residual deficits 09/15/2012  ? Acute left ACA ischemic stroke ([place]) 09/15/2012  ? Tobacco use 09/15/2012  ? Hypertension 10/26/2010  ? ?PCP:  Dixie Dials, MD ?Pharmacy:   ?[facility] [place] ?[place] [address] ?Phone: [phone] Fax: [phone] ? ?[facility] ?[address] ?[place] [address] ?Phone: [phone] Fax: [phone] ? ?[place] ([place]), [place] - [address] ?[address] ?[place] [address] ?Phone: [phone] Fax: [phone] ? ? ?Readmission Risk Interventions ?   ? View : No data to display.  ?  ?  ?  ? ? ? ?

## 2021-06-11 ENCOUNTER — Inpatient Hospital Stay (HOSPITAL_COMMUNITY): Payer: 59

## 2021-06-11 DIAGNOSIS — I214 Non-ST elevation (NSTEMI) myocardial infarction: Secondary | ICD-10-CM | POA: Diagnosis not present

## 2021-06-11 DIAGNOSIS — I442 Atrioventricular block, complete: Secondary | ICD-10-CM | POA: Diagnosis not present

## 2021-06-11 LAB — GLUCOSE, CAPILLARY
Glucose-Capillary: 122 mg/dL — ABNORMAL HIGH (ref 70–99)
Glucose-Capillary: 124 mg/dL — ABNORMAL HIGH (ref 70–99)
Glucose-Capillary: 129 mg/dL — ABNORMAL HIGH (ref 70–99)
Glucose-Capillary: 133 mg/dL — ABNORMAL HIGH (ref 70–99)
Glucose-Capillary: 144 mg/dL — ABNORMAL HIGH (ref 70–99)
Glucose-Capillary: 95 mg/dL (ref 70–99)

## 2021-06-11 LAB — BASIC METABOLIC PANEL
Anion gap: 9 (ref 5–15)
BUN: 21 mg/dL — ABNORMAL HIGH (ref 6–20)
CO2: 24 mmol/L (ref 22–32)
Calcium: 9.3 mg/dL (ref 8.9–10.3)
Chloride: 106 mmol/L (ref 98–111)
Creatinine, Ser: 1.32 mg/dL — ABNORMAL HIGH (ref 0.61–1.24)
GFR, Estimated: >60 mL/min (ref >60)
Glucose, Bld: 126 mg/dL — ABNORMAL HIGH (ref 70–99)
Potassium: 4.1 mmol/L (ref 3.5–5.1)
Sodium: 139 mmol/L (ref 135–145)

## 2021-06-11 LAB — CBC
HCT: 36.7 % — ABNORMAL LOW (ref 39.0–52.0)
Hemoglobin: 11.7 g/dL — ABNORMAL LOW (ref 13.0–17.0)
MCH: 28 pg (ref 26.0–34.0)
MCHC: 31.9 g/dL (ref 30.0–36.0)
MCV: 87.8 fL (ref 80.0–100.0)
Platelets: 144 10*3/uL — ABNORMAL LOW (ref 150–400)
RBC: 4.18 MIL/uL — ABNORMAL LOW (ref 4.22–5.81)
RDW: 14 % (ref 11.5–15.5)
WBC: 6.4 10*3/uL (ref 4.0–10.5)
nRBC: 0 % (ref 0.0–0.2)

## 2021-06-11 LAB — HEPARIN LEVEL (UNFRACTIONATED)
Heparin Unfractionated: 0.24 IU/mL — ABNORMAL LOW (ref 0.30–0.70)
Heparin Unfractionated: 0.47 IU/mL (ref 0.30–0.70)

## 2021-06-11 MED ORDER — ASCORBIC ACID 500 MG PO TABS
500.0000 mg | ORAL_TABLET | Freq: Every day | ORAL | Status: DC
  Administered 2021-06-11 – 2021-06-14 (×4): 500 mg via ORAL
  Filled 2021-06-11 (×3): qty 1

## 2021-06-11 MED ORDER — ATORVASTATIN CALCIUM 80 MG PO TABS
80.0000 mg | ORAL_TABLET | Freq: Every day | ORAL | Status: DC
  Administered 2021-06-11 – 2021-06-14 (×4): 80 mg via ORAL
  Filled 2021-06-11 (×3): qty 1

## 2021-06-11 MED ORDER — ISOSORBIDE DINITRATE 10 MG PO TABS
10.0000 mg | ORAL_TABLET | Freq: Two times a day (BID) | ORAL | Status: DC
Start: 2021-06-11 — End: 2021-06-14
  Administered 2021-06-11 – 2021-06-14 (×6): 10 mg via ORAL
  Filled 2021-06-11 (×6): qty 1

## 2021-06-11 MED ORDER — HYDROCHLOROTHIAZIDE 25 MG PO TABS
25.0000 mg | ORAL_TABLET | Freq: Every day | ORAL | Status: DC
  Administered 2021-06-11: 25 mg via ORAL
  Filled 2021-06-11: qty 1

## 2021-06-11 MED ORDER — LOSARTAN POTASSIUM 50 MG PO TABS
100.0000 mg | ORAL_TABLET | Freq: Every day | ORAL | Status: DC
  Administered 2021-06-11 – 2021-06-14 (×4): 100 mg via ORAL
  Filled 2021-06-11 (×3): qty 2

## 2021-06-11 MED ORDER — AMLODIPINE BESYLATE 10 MG PO TABS
10.0000 mg | ORAL_TABLET | Freq: Every day | ORAL | Status: DC
  Administered 2021-06-11: 10 mg via ORAL

## 2021-06-11 MED ORDER — HYDRALAZINE HCL 10 MG PO TABS
10.0000 mg | ORAL_TABLET | Freq: Two times a day (BID) | ORAL | Status: DC
  Administered 2021-06-11 – 2021-06-14 (×6): 10 mg via ORAL
  Filled 2021-06-11 (×6): qty 1

## 2021-06-11 MED ORDER — EMPAGLIFLOZIN 10 MG PO TABS
10.0000 mg | ORAL_TABLET | Freq: Every day | ORAL | Status: DC
  Administered 2021-06-12 – 2021-06-14 (×3): 10 mg via ORAL
  Filled 2021-06-11 (×3): qty 1

## 2021-06-11 MED ORDER — FUROSEMIDE 10 MG/ML IJ SOLN
20.0000 mg | Freq: Once | INTRAMUSCULAR | Status: AC
  Administered 2021-06-11: 20 mg via INTRAVENOUS
  Filled 2021-06-11: qty 2

## 2021-06-11 MED ORDER — AMLODIPINE BESYLATE 5 MG PO TABS
5.0000 mg | ORAL_TABLET | Freq: Every day | ORAL | Status: DC
  Administered 2021-06-12 – 2021-06-13 (×2): 5 mg via ORAL
  Filled 2021-06-11 (×3): qty 1

## 2021-06-11 MED ORDER — ASPIRIN 81 MG PO CHEW
81.0000 mg | CHEWABLE_TABLET | Freq: Every day | ORAL | Status: DC
  Administered 2021-06-11 – 2021-06-14 (×4): 81 mg via ORAL
  Filled 2021-06-11 (×3): qty 1

## 2021-06-11 MED ORDER — HYDROCHLOROTHIAZIDE 12.5 MG PO TABS
12.5000 mg | ORAL_TABLET | Freq: Every day | ORAL | Status: DC
  Administered 2021-06-12 – 2021-06-13 (×2): 12.5 mg via ORAL
  Filled 2021-06-11 (×3): qty 1

## 2021-06-11 MED ORDER — PANTOPRAZOLE SODIUM 40 MG PO TBEC
40.0000 mg | DELAYED_RELEASE_TABLET | Freq: Every day | ORAL | Status: DC
Start: 2021-06-11 — End: 2021-06-14
  Administered 2021-06-11 – 2021-06-14 (×4): 40 mg via ORAL
  Filled 2021-06-11 (×4): qty 1

## 2021-06-11 MED ORDER — HYDROCHLOROTHIAZIDE 25 MG PO TABS: 25.0000 mg | ORAL_TABLET | Freq: Every day | ORAL | Status: DC

## 2021-06-11 NOTE — Progress Notes (Signed)
ANTICOAGULATION CONSULT NOTE - Follow Up Consult ? ?Pharmacy Consult for heparin ?Indication:  NSTEMI in setting of CVA ? ?Labs: ?Recent Labs  ?  06/09/21 ?0440 06/10/21 ?0327 06/11/21 ?0107  ?HGB 11.7* 11.6* 11.7*  ?HCT 35.1* 35.8* 36.7*  ?PLT 130* 151 144*  ?HEPARINUNFRC 0.32 0.40 0.24*  ?CREATININE 1.04 0.97 1.32*  ? ? ?Assessment: ?51yo male subtherapeutic on heparin after two levels at goal; no infusion issues or signs of bleeding per RN. ? ?Goal of Therapy:  ?Heparin level 0.3-0.5 units/ml ?  ?Plan:  ?Will increase heparin infusion by ~1 units/kg/hr to 1600 units/hr and check level in 6 hours.   ? ?Wynona Neat, PharmD, BCPS  ?06/11/2021,3:17 AM ? ? ?
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
## 2021-06-11 NOTE — Progress Notes (Addendum)
0750 - Pt ate about half of meal, seemed to have some difficulty eat. Text page sent to weekend OT to ask about plate guard for meals. Pt interactive, aphasic but following commands. Does have difficulty trying to follow multi-step commands. Wife at bedside and supportive.  ? ?0905 - Cardiology at bedside, pacer turned down to 40 VVI, BP frequency increased to Q15 min to monitor.  ? ?1000 - Feeding tube seems to be either clogged or kinked. Attempted to flush without success. Unable to pull back any tube feed.Dr. Loanne Drilling notified. Getting KUB.  ? ?1030: Dr. Doylene Canard notified that inside of sheath covering for TVP fluid was seen and found to be leaking out. Concerned that it is urine. Dr. Doylene Canard to come to bedside to assess. Pt bathed at this time, dressing for TVP changed, purwick changed.  ? ?1115: Dr. Doylene Canard at bedside to assess TVP and pt. Dr. Doylene Canard wants to monitor HR for at least 1 hour more and then return to pull TVP and sheath out. Pt has been maintaining HR of above at least 60 BPM since EP turned pacer down to 60. BP has been stable and no changes noted with pt.  ? ?1215: TVP and sheath removed by Dr. Doylene Canard. Dr. Doylene Canard held pressure for 5 minutes, dressing applied. VSS during and after removal.  ? ?1300 - Sheath site soft and appropriate color, skin warm and dry. Dressing clean and dry. Pulse present distal to site, skin warm and dry distal to site.  ? ?1400 - sheath site continues to be clean dry and soft, skin warm and dry distal to site and pulses present distal to site.  ? ?1500 - family visiting with patient.  ? ?1630 - meal tray delivered to pt. Bed rest ended. Pt placed into chair position in bed. Pts meal tray set up, pt eating with little to no hands on assistance, most needing some vocal reminders.  ? ?1730 - Pt ate most of meal, family at bedside and supportive. Pts bed still in chair position, pt interactive with family at bedside.  ? ?[id number] - Pt with large BM, bed pad changed and pt  cleaned up.  ? ?

## 2021-06-11 NOTE — Plan of Care (Signed)
?  Problem: Safety: ?Goal: Non-violent Restraint(s) ?Outcome: Progressing ?  ?Problem: Education: ?Goal: Knowledge of General Education information will improve ?Description: Including pain rating scale, medication(s)/side effects and non-pharmacologic comfort measures ?Outcome: Progressing ?  ?Problem: Health Behavior/Discharge Planning: ?Goal: Ability to manage health-related needs will improve ?Outcome: Progressing ?  ?Problem: Clinical Measurements: ?Goal: Ability to maintain clinical measurements within normal limits will improve ?Outcome: Progressing ?Goal: Will remain free from infection ?Outcome: Progressing ?Goal: Diagnostic test results will improve ?Outcome: Progressing ?Goal: Respiratory complications will improve ?Outcome: Progressing ?Goal: Cardiovascular complication will be avoided ?Outcome: Progressing ?  ?Problem: Activity: ?Goal: Risk for activity intolerance will decrease ?Outcome: Progressing ?  ?Problem: Nutrition: ?Goal: Adequate nutrition will be maintained ?Outcome: Progressing ?  ?Problem: Coping: ?Goal: Level of anxiety will decrease ?Outcome: Progressing ?  ?Problem: Elimination: ?Goal: Will not experience complications related to bowel motility ?Outcome: Progressing ?Goal: Will not experience complications related to urinary retention ?Outcome: Progressing ?  ?Problem: Pain Managment: ?Goal: General experience of comfort will improve ?Outcome: Progressing ?  ?Problem: Safety: ?Goal: Ability to remain free from injury will improve ?Outcome: Progressing ?  ?Problem: Education: ?Goal: Knowledge of disease or condition will improve ?Outcome: Progressing ?Goal: Knowledge of secondary prevention will improve (SELECT ALL) ?Outcome: Progressing ?Goal: Knowledge of patient specific risk factors will improve (INDIVIDUALIZE FOR PATIENT) ?Outcome: Progressing ?Goal: Individualized Educational Video(s) ?Outcome: Progressing ?  ?

## 2021-06-11 NOTE — Progress Notes (Signed)
ANTICOAGULATION CONSULT NOTE - Follow-up Consult ? ?Pharmacy Consult for heparin ?Indication: chest pain/ACS ? ?No Known Allergies ? ?Patient Measurements: ?Height: 6\' 2"  (188 cm) ?Weight: 86.7 kg (191 lb 2.2 oz) ?IBW/kg (Calculated) : 82.2 ?Heparin Dosing Weight: 103.1 kg  ? ?Vital Signs: ?Temp: 98 ?F (36.7 ?C) (04/29 0715) ?Temp Source: Oral (04/29 0715) ?BP: 127/84 (04/29 [id number]) ?Pulse Rate: 59 (04/29 0819) ? ?Labs: ?Recent Labs  ?  06/09/21 ?0440 06/10/21 ?0327 06/11/21 ?0107 06/11/21 ?[id number]  ?HGB 11.7* 11.6* 11.7*  --   ?HCT 35.1* 35.8* 36.7*  --   ?PLT 130* 151 144*  --   ?HEPARINUNFRC 0.32 0.40 0.24* 0.47  ?CREATININE 1.04 0.97 1.32*  --   ? ? ? ?Estimated Creatinine Clearance: 76.1 mL/min (A) (by C-G formula based on SCr of 1.32 mg/dL (H)). ? ? ?Assessment: ?73 YOM with NSTEMI and CVA. Will target a more conservative heparin level.    ? ?Heparin level 0.47 at goal on 1,600 units/hr. CBC stable. No bleeding or issues with infusion noted.  ?CBC stable  ? ?Goal of Therapy:  ?Heparin level 0.3-0.5 units/mL ?Monitor platelets by anticoagulation protocol: Yes ?  ?Plan:  ?Continue heparin at 1,600 units/hr  ?Daily HL and CBC  ? ? ? ?Bonnita Nasuti Pharm.D. CPP, BCPS ?Clinical Pharmacist ?[phone] ?06/11/2021 10:22 AM  ? ? ? ? ? ?

## 2021-06-11 NOTE — Progress Notes (Signed)
Ref: Orpah Cobb, MD ? ? ?Subjective:  ?Awake. Normal sinus rhythm without pacer support need. ?Moving right leg some against gravity. Double vision continues. ? ?Objective:  ?Vital Signs in the last 24 hours: ?Temp:  [98 ?F (36.7 ?C)-98.9 ?F (37.2 ?C)] 98.9 ?F (37.2 ?C) (04/29 1126) ?Pulse Rate:  [59-99] 88 (04/29 1100) ?Cardiac Rhythm: Ventricular paced (04/29 0800) ?Resp:  [0-28] 16 (04/29 1100) ?BP: (118-155)/(70-111) 125/85 (04/29 1100) ?SpO2:  [92 %-100 %] 97 % (04/29 1100) ?Arterial Line BP: (132-159)/(58-68) 144/66 (04/28 1650) ? ?Physical Exam: ?BP Readings from Last 1 Encounters:  ?06/11/21 125/85  ?   ?Wt Readings from Last 1 Encounters:  ?06/09/21 86.7 kg  ?  Weight change:  Body mass index is 24.54 kg/m?. ?HEENT: [place]/AT, Eyes-Brown, Conjunctiva-Pink, Sclera-Non-icteric ?Neck: No JVD, No bruit, Trachea midline. ?Lungs:  Clear, Bilateral. ?Cardiac:  Regular rhythm, normal S1 and S2, no S3. II/VI systolic murmur. ?Abdomen:  Soft, non-tender. BS present. ?Extremities:  No edema present. No cyanosis. No clubbing. ?CNS: AxOx2, Cranial nerves grossly intact, right sided hemiplegia with occasional right leg movement.  ?Skin: Warm and dry. ? ? ?Intake/Output from previous day: ?04/28 0701 - 04/29 0700 ?In: 3016.8 [I.V.:504.4; NG/GT:2160; IV Piggyback:352.5] ?Out: 1100 [Urine:1100] ? ? ? ?Lab Results: ?BMET ?   ?Component Value Date/Time  ? NA 139 06/11/2021 0107  ? NA 140 06/10/2021 0327  ? NA 140 06/09/2021 0440  ? K 4.1 06/11/2021 0107  ? K 3.3 (L) 06/10/2021 0327  ? K 3.6 06/09/2021 0440  ? CL 106 06/11/2021 0107  ? CL 109 06/10/2021 0327  ? CL 109 06/09/2021 0440  ? CO2 24 06/11/2021 0107  ? CO2 23 06/10/2021 0327  ? CO2 23 06/09/2021 0440  ? GLUCOSE 126 (H) 06/11/2021 0107  ? GLUCOSE 167 (H) 06/10/2021 0327  ? GLUCOSE 142 (H) 06/09/2021 0440  ? BUN 21 (H) 06/11/2021 0107  ? BUN 10 06/10/2021 0327  ? BUN 14 06/09/2021 0440  ? CREATININE 1.32 (H) 06/11/2021 0107  ? CREATININE 0.97 06/10/2021 0327  ?  CREATININE 1.04 06/09/2021 0440  ? CALCIUM 9.3 06/11/2021 0107  ? CALCIUM 8.7 (L) 06/10/2021 0327  ? CALCIUM 8.7 (L) 06/09/2021 0440  ? GFRNONAA >60 06/11/2021 0107  ? GFRNONAA >60 06/10/2021 0327  ? GFRNONAA >60 06/09/2021 0440  ? GFRAA 79 (L) 10/25/2012 2355  ? GFRAA 86 (L) 10/24/2012 1954  ? GFRAA 90 (L) 10/24/2012 0957  ? ?CBC ?   ?Component Value Date/Time  ? WBC 6.4 06/11/2021 0107  ? RBC 4.18 (L) 06/11/2021 0107  ? HGB 11.7 (L) 06/11/2021 0107  ? HCT 36.7 (L) 06/11/2021 0107  ? PLT 144 (L) 06/11/2021 0107  ? MCV 87.8 06/11/2021 0107  ? MCH 28.0 06/11/2021 0107  ? MCHC 31.9 06/11/2021 0107  ? RDW 14.0 06/11/2021 0107  ? LYMPHSABS 0.7 06/08/2021 0501  ? MONOABS 0.9 06/08/2021 0501  ? EOSABS 0.0 06/08/2021 0501  ? BASOSABS 0.0 06/08/2021 0501  ? ?HEPATIC Function Panel ?Recent Labs  ?  11/29/20 ?1258 06/07/21 ?0903  ?PROT 7.7 6.8  ? ?HEMOGLOBIN A1C ?No components found for: HGA1C,  MPG ?CARDIAC ENZYMES ?Lab Results  ?Component Value Date  ? CKTOTAL 319 (H) 09/18/2010  ? CKMB 2.0 09/18/2010  ? TROPONINI <0.30 10/24/2012  ? TROPONINI <0.30 09/15/2012  ? TROPONINI <0.30 09/18/2010  ? ?BNP ?No results for input(s): PROBNP in the last 8760 hours. ?TSH ?Recent Labs  ?  11/30/20 ?0305  ?TSH 1.016  ? ?CHOLESTEROL ?Recent Labs  ?  11/30/20 ?0305 06/08/21 ?0501  ?CHOL 157 173  ? ? ?Scheduled Meds: ? amLODipine  10 mg Oral Daily  ? vitamin C  500 mg Oral Daily  ? aspirin  81 mg Oral Daily  ? atorvastatin  80 mg Oral Daily  ? chlorhexidine  15 mL Mouth Rinse BID  ? Chlorhexidine Gluconate Cloth  6 each Topical Daily  ? feeding supplement (PROSource TF)  45 mL Per Tube BID  ? hydrALAZINE  10 mg Oral BID  ? hydrochlorothiazide  25 mg Oral Daily  ? insulin aspart  0-15 Units Subcutaneous Q4H  ? isosorbide dinitrate  10 mg Oral BID  ? losartan  100 mg Oral Daily  ? mouth rinse  15 mL Mouth Rinse BID  ? pantoprazole  40 mg Oral Daily  ? sodium chloride flush  3 mL Intravenous Q12H  ? ?Continuous Infusions: ? sodium chloride    ?  sodium chloride    ? feeding supplement (OSMOLITE 1.5 CAL) Stopped (06/11/21 0948)  ? heparin 1,600 Units/hr (06/11/21 1100)  ? ?PRN Meds:.sodium chloride, sodium chloride, acetaminophen **OR** acetaminophen (TYLENOL) oral liquid 160 mg/5 mL **OR** acetaminophen, nitroGLYCERIN, ondansetron (ZOFRAN) IV, sodium chloride flush ? ?Assessment/Plan: ? NSTEMI ?Stroke with right hemiplegia and double vision ?3rd degree heart block to sinus rhythm ?CAD ?S/P LAD stent ?HTN ?HLD ?HCM ?PVD ? ?Plan: ?DC temporary pacer wire. ?Increase activity as tolerated. ?Transient AV block has resolved and may not need pacemaker. ?Decrease amlodipine to 5 mg. Daily and continue low dose hydralazine and isosorbide. ? ? LOS: 4 days  ? ?Time spent including chart review, lab review, examination, discussion with patient/Nurse/Family : 30 min ? ? ?Orpah Cobb  MD  ?06/11/2021, 12:15 PM ? ? ? ? ?

## 2021-06-11 NOTE — Progress Notes (Signed)
? ?Progress Note ? ?Patient Name: Henry Simmons ?Date of Encounter: 06/11/2021 ? ?[facility] Cardiologist: Dr. Doylene Canard ? ?Subjective  ? ?Awake and alert.  Remains aphasic with dense right-sided hemiparesis ? ?Inpatient Medications  ?  ?Scheduled Meds: ? amLODipine  10 mg Per Tube Daily  ? vitamin C  500 mg Per Tube Daily  ? aspirin  81 mg Per Tube Daily  ? atorvastatin  80 mg Per Tube Daily  ? chlorhexidine  15 mL Mouth Rinse BID  ? Chlorhexidine Gluconate Cloth  6 each Topical Daily  ? feeding supplement (PROSource TF)  45 mL Per Tube BID  ? hydrALAZINE  10 mg Per Tube BID  ? [START ON 06/12/2021] hydrochlorothiazide  25 mg Per Tube Daily  ? insulin aspart  0-15 Units Subcutaneous Q4H  ? isosorbide dinitrate  10 mg Per Tube BID  ? losartan  100 mg Per Tube Daily  ? mouth rinse  15 mL Mouth Rinse BID  ? pantoprazole sodium  40 mg Per Tube Daily  ? sodium chloride flush  3 mL Intravenous Q12H  ? ?Continuous Infusions: ? sodium chloride    ? sodium chloride    ? feeding supplement (OSMOLITE 1.5 CAL) 60 mL/hr at 06/11/21 0600  ? heparin 1,600 Units/hr (06/11/21 0800)  ? ?PRN Meds: ?sodium chloride, sodium chloride, acetaminophen **OR** acetaminophen (TYLENOL) oral liquid 160 mg/5 mL **OR** acetaminophen, nitroGLYCERIN, ondansetron (ZOFRAN) IV, sodium chloride flush  ? ?Vital Signs  ?  ?Vitals:  ? 06/11/21 0700 06/11/21 0715 06/11/21 0800 06/11/21 0819  ?BP: (!) 150/79  118/88 127/84  ?Pulse: 70  61 (!) 59  ?Resp: (!) 21  10 14   ?Temp:  98 ?F (36.7 ?C)    ?TempSrc:  Oral    ?SpO2:   98% 98%  ?Weight:      ?Height:      ? ? ?Intake/Output Summary (Last 24 hours) at 06/11/2021 0921 ?Last data filed at 06/11/2021 [id number] ?Gross per 24 hour  ?Intake 1898.1 ml  ?Output 1100 ml  ?Net 798.1 ml  ? ? ? ?  06/09/2021  ?  4:00 AM 06/07/2021  ?  8:42 AM 12/01/2020  ?  4:26 AM  ?Last 3 Weights  ?Weight (lbs) 191 lb 2.2 oz 229 lb 4.5 oz 180 lb 8.9 oz  ?Weight (kg) 86.7 kg 104 kg 81.9 kg  ?   ? ?Telemetry  ?  ?Sinus rhythm, Mobitz 1 AV  block ? ?ECG  ?  ?None new ? ?Physical Exam  ? ?GEN: Well nourished, well developed, in no acute distress  ?HEENT: normal  ?Neck: no JVD, carotid bruits, or masses ?Cardiac: irregular; no murmurs, rubs, or gallops,no edema  ?Respiratory:  clear to auscultation bilaterally, normal work of breathing ?GI: soft, nontender, nondistended, + BS ?MS: no deformity or atrophy  ?Skin: warm and dry ?Neuro: Aphasic, dense right-sided hemiparesis ?Psych: euthymic mood, full affect  ? ?Labs  ?  ?High Sensitivity Troponin:   ?Recent Labs  ?Lab 06/07/21 ?0903 06/07/21 ?1100  ?TROPONINIHS [id number]* 12,571*  ? ?   ?Chemistry ?Recent Labs  ?Lab 06/07/21 ?0903 06/07/21 ?2159 06/09/21 ?0440 06/09/21 ?1718 06/10/21 ?0327 06/11/21 ?0107  ?NA 140   < > 140  --  140 139  ?K 4.0   < > 3.6  --  3.3* 4.1  ?CL 107   < > 109  --  109 106  ?CO2 24   < > 23  --  23 24  ?GLUCOSE 119*   < >  142*  --  167* 126*  ?BUN 18   < > 14  --  10 21*  ?CREATININE 1.51*   < > 1.04  --  0.97 1.32*  ?CALCIUM 9.3   < > 8.7*  --  8.7* 9.3  ?MG 2.2   < > 2.3 2.2 2.1  --   ?PROT 6.8  --   --   --   --   --   ?ALBUMIN 3.7  --   --   --   --   --   ?AST 81*  --   --   --   --   --   ?ALT 20  --   --   --   --   --   ?ALKPHOS 46  --   --   --   --   --   ?BILITOT 1.3*  --   --   --   --   --   ?GFRNONAA 55*   < > >60  --  >60 >60  ?ANIONGAP 9   < > 8  --  8 9  ? < > = values in this interval not displayed.  ? ?  ?Lipids  ?Recent Labs  ?Lab 06/08/21 ?0501  ?CHOL 173  ?TRIG 171*  175*  ?HDL 21*  ?LDLCALC 118*  ?CHOLHDL 8.2  ? ?  ?Hematology ?Recent Labs  ?Lab 06/09/21 ?0440 06/10/21 ?0327 06/11/21 ?0107  ?WBC 9.7 8.1 6.4  ?RBC 4.07* 4.14* 4.18*  ?HGB 11.7* 11.6* 11.7*  ?HCT 35.1* 35.8* 36.7*  ?MCV 86.2 86.5 87.8  ?MCH 28.7 28.0 28.0  ?MCHC 33.3 32.4 31.9  ?RDW 14.0 13.7 14.0  ?PLT 130* 151 144*  ? ? ?Thyroid No results for input(s): TSH, FREET4 in the last 168 hours.  ?BNPNo results for input(s): BNP, PROBNP in the last 168 hours.  ?DDimer No results for input(s): DDIMER in  the last 168 hours.  ? ?Radiology  ?  ?Cardiac Studies  ? ?06/07/21: TTE ?IMPRESSIONS  ? 1. Left ventricular ejection fraction, by estimation, is 40 to 45%. The  ?left ventricle has mildly decreased function. The left ventricle  ?demonstrates regional wall motion abnormalities (see scoring  ?diagram/findings for description). There is severe  ?concentric left ventricular hypertrophy. Left ventricular diastolic  ?parameters are consistent with Grade I diastolic dysfunction (impaired  ?relaxation). There is severe hypokinesis of the left ventricular, entire  ?inferior wall and inferoseptal wall.  ? 2. Right ventricular systolic function is normal. The right ventricular  ?size is normal. There is normal pulmonary artery systolic pressure.  ? 3. Left atrial size was mildly dilated.  ? 4. Right atrial size was mildly dilated.  ? 5. The mitral valve is normal in structure. Moderate mitral valve  ?regurgitation.  ? 6. The aortic valve is tricuspid. Aortic valve regurgitation is not  ?visualized.  ? 7. The inferior vena cava is normal in size with <50% respiratory  ?variability, suggesting right atrial pressure of 8 mmHg.  ? 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 Cardiac interventions.  ?  ?  ?06/07/2021: aborted LHC ?  Mid LM to Dist LM lesion is 25% stenosed. ?  Ramus lesion is 80% stenosed. ?  2nd Diag lesion is 20% stenosed. ?  Prox LAD lesion is 60% stenosed. ?  Dist LAD lesion is 80% stenosed. ?  1st Mrg lesion is 80% stenosed. ?  Previously placed Mid LAD stent (unknown type) is  widely patent. ?  The left ventricular  ejection fraction is 35-45% by visual estimate. ?  ?Stroke work up in progress. ?Per wife patient had stroke symptoms wax and wane since this morning prior to arrival to ER. ?Medical treatment for now. ?  ?Cath: 11/30/2020 ?  Mid LM to Dist LM lesion is 25% stenosed. ?  Mid LAD lesion is 90% stenosed. ?  2nd Diag lesion is 20% stenosed. ?  1st Mrg lesion is  100% stenosed. ?  Mid Cx to Dist Cx lesion is 100% stenosed. ?  Prox RCA lesion is 50% stenosed. ?  Mid RCA to Dist RCA lesion is 40% stenosed. ?  Dist RCA lesion is 65% stenosed. ?  Ramus lesion is 80% stenosed. ?  A drug-eluting stent was successfully placed using a STENT ONYX FRONTIER [id number]. ?  Post intervention, there is a 0% residual stenosis. ?  LV end diastolic pressure is normal. ?  ?3 vessel CAD. The culprit lesion is the first OM which is occluded. The distal LCx is also occluded. There is a segmental 90% mid LAD stenosis. 80% ramus intermediate which is small to moderate in size. 65% distal RCA. ?Normal LVEDP ?Successful PCI of the mid LAD with 2.75 x 38 mm Onyx Frontier with IVUS guidance ?  ?Plan: DAPT for one year. Aggressive risk factor modification. Miyo Aina treat residual disease medically. Anticipate DC tomorrow.  ?  ?  ?Echo: 11/29/20 ?IMPRESSIONS  ? 1. Consider cardiac amyloid.  ? 2. Left ventricular ejection fraction, by estimation, is 35 to 40%. The  ?left ventricle has severely decreased function. The left ventricle  ?demonstrates global hypokinesis. There is severe concentric left  ?ventricular hypertrophy. Left ventricular  ?diastolic parameters are consistent with Grade II diastolic dysfunction  ?(pseudonormalization).  ? 3. Right ventricular systolic function is normal. The right ventricular  ?size is normal.  ? 4. The pericardial effusion is anterior to the right ventricle.  ? 5. The mitral valve is normal in structure. Trivial mitral valve  ?regurgitation. No evidence of mitral stenosis.  ? 6. The aortic valve is normal in structure. Aortic valve regurgitation is  ?not visualized. No aortic stenosis is present.  ? 7. The inferior vena cava is normal in size with greater than 50%  ?respiratory variability, suggesting right atrial pressure of 3 mmHg.  ? 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  ? ?Patient Profile  ?   ?51 y.o. male HTN, prior  strokes (2014. 2015), HLD, smoker, CAD admitted 05/07/21   ? ?while getting ready for work having difficulty with usual tasks like getting dressed, putting on his belt, some degree of confusin his wife t

## 2021-06-12 DIAGNOSIS — I442 Atrioventricular block, complete: Secondary | ICD-10-CM | POA: Diagnosis not present

## 2021-06-12 DIAGNOSIS — I214 Non-ST elevation (NSTEMI) myocardial infarction: Secondary | ICD-10-CM | POA: Diagnosis not present

## 2021-06-12 LAB — GLUCOSE, CAPILLARY
Glucose-Capillary: 119 mg/dL — ABNORMAL HIGH (ref 70–99)
Glucose-Capillary: 118 mg/dL — ABNORMAL HIGH (ref 70–99)
Glucose-Capillary: 110 mg/dL — ABNORMAL HIGH (ref 70–99)
Glucose-Capillary: 108 mg/dL — ABNORMAL HIGH (ref 70–99)
Glucose-Capillary: 127 mg/dL — ABNORMAL HIGH (ref 70–99)

## 2021-06-12 LAB — CBC
HCT: 37.1 % — ABNORMAL LOW (ref 39.0–52.0)
Hemoglobin: 12.3 g/dL — ABNORMAL LOW (ref 13.0–17.0)
MCH: 28.5 pg (ref 26.0–34.0)
MCHC: 33.2 g/dL (ref 30.0–36.0)
MCV: 86.1 fL (ref 80.0–100.0)
Platelets: 172 10*3/uL (ref 150–400)
RBC: 4.31 MIL/uL (ref 4.22–5.81)
RDW: 13.8 % (ref 11.5–15.5)
WBC: 6.6 10*3/uL (ref 4.0–10.5)
nRBC: 0 % (ref 0.0–0.2)

## 2021-06-12 LAB — HEPARIN LEVEL (UNFRACTIONATED): Heparin Unfractionated: 0.48 IU/mL (ref 0.30–0.70)

## 2021-06-12 MED ORDER — ENOXAPARIN SODIUM 40 MG/0.4ML IJ SOSY
40.0000 mg | PREFILLED_SYRINGE | INTRAMUSCULAR | Status: DC
  Administered 2021-06-13 – 2021-06-14 (×2): 40 mg via SUBCUTANEOUS
  Filled 2021-06-12 (×2): qty 0.4

## 2021-06-12 MED ORDER — CLOPIDOGREL BISULFATE 75 MG PO TABS
75.0000 mg | ORAL_TABLET | Freq: Every day | ORAL | Status: DC
Start: 2021-06-12 — End: 2021-06-14
  Administered 2021-06-12 – 2021-06-14 (×3): 75 mg via ORAL
  Filled 2021-06-12 (×3): qty 1

## 2021-06-12 NOTE — Progress Notes (Signed)
? ?Progress Note ? ?Patient Name: Henry Simmons ?Date of Encounter: 06/12/2021 ? ?[facility] Cardiologist: Dr. Doylene Canard ? ?Subjective  ? ?Awake and alert.  Remains aphasic with dense right-sided hemiparesis.  AV conduction has returned. ? ?Inpatient Medications  ?  ?Scheduled Meds: ? amLODipine  5 mg Oral Daily  ? vitamin C  500 mg Oral Daily  ? aspirin  81 mg Oral Daily  ? atorvastatin  80 mg Oral Daily  ? chlorhexidine  15 mL Mouth Rinse BID  ? Chlorhexidine Gluconate Cloth  6 each Topical Daily  ? empagliflozin  10 mg Oral Daily  ? feeding supplement (PROSource TF)  45 mL Per Tube BID  ? hydrALAZINE  10 mg Oral BID  ? hydrochlorothiazide  12.5 mg Oral Daily  ? insulin aspart  0-15 Units Subcutaneous Q4H  ? isosorbide dinitrate  10 mg Oral BID  ? losartan  100 mg Oral Daily  ? mouth rinse  15 mL Mouth Rinse BID  ? pantoprazole  40 mg Oral Daily  ? sodium chloride flush  3 mL Intravenous Q12H  ? ?Continuous Infusions: ? sodium chloride    ? sodium chloride    ? feeding supplement (OSMOLITE 1.5 CAL) Stopped (06/11/21 0948)  ? heparin 1,600 Units/hr (06/12/21 0700)  ? ?PRN Meds: ?sodium chloride, sodium chloride, acetaminophen **OR** acetaminophen (TYLENOL) oral liquid 160 mg/5 mL **OR** acetaminophen, nitroGLYCERIN, ondansetron (ZOFRAN) IV, sodium chloride flush  ? ?Vital Signs  ?  ?Vitals:  ? 06/12/21 0400 06/12/21 0500 06/12/21 0600 06/12/21 0700  ?BP: 125/78 (!) 133/91 124/75 126/74  ?Pulse: 72 73 67 79  ?Resp: (!) 22 18 11 13   ?Temp:    98.4 ?F (36.9 ?C)  ?TempSrc:    Oral  ?SpO2: 94% 95% 98% 98%  ?Weight:  88.6 kg    ?Height:      ? ? ?Intake/Output Summary (Last 24 hours) at 06/12/2021 0744 ?Last data filed at 06/12/2021 0730 ?Gross per 24 hour  ?Intake 1113.01 ml  ?Output 2600 ml  ?Net -1486.99 ml  ? ? ? ?  06/12/2021  ?  5:00 AM 06/09/2021  ?  4:00 AM 06/07/2021  ?  8:42 AM  ?Last 3 Weights  ?Weight (lbs) 195 lb 5.2 oz 191 lb 2.2 oz 229 lb 4.5 oz  ?Weight (kg) 88.6 kg 86.7 kg 104 kg  ?   ? ?Telemetry  ?   ?Sinus rhythm, intermittent Mobitz 1 AV block-personally reviewed ? ?ECG  ?  ?None new ? ?Physical Exam  ? ?GEN: Well nourished, well developed, in no acute distress  ?HEENT: normal  ?Neck: no JVD, carotid bruits, or masses ?Cardiac: RRR; no murmurs, rubs, or gallops,no edema  ?Respiratory:  clear to auscultation bilaterally, normal work of breathing ?GI: soft, nontender, nondistended, + BS ?MS: no deformity or atrophy  ?Skin: warm and dry ?Neuro: Aphasic, right-sided hemiparesis ?Psych: euthymic mood, full affect  ? ?Labs  ?  ?High Sensitivity Troponin:   ?Recent Labs  ?Lab 06/07/21 ?0903 06/07/21 ?1100  ?TROPONINIHS [id number]* 12,571*  ? ?   ?Chemistry ?Recent Labs  ?Lab 06/07/21 ?0903 06/07/21 ?2159 06/09/21 ?0440 06/09/21 ?1718 06/10/21 ?0327 06/11/21 ?0107  ?NA 140   < > 140  --  140 139  ?K 4.0   < > 3.6  --  3.3* 4.1  ?CL 107   < > 109  --  109 106  ?CO2 24   < > 23  --  23 24  ?GLUCOSE 119*   < >  142*  --  167* 126*  ?BUN 18   < > 14  --  10 21*  ?CREATININE 1.51*   < > 1.04  --  0.97 1.32*  ?CALCIUM 9.3   < > 8.7*  --  8.7* 9.3  ?MG 2.2   < > 2.3 2.2 2.1  --   ?PROT 6.8  --   --   --   --   --   ?ALBUMIN 3.7  --   --   --   --   --   ?AST 81*  --   --   --   --   --   ?ALT 20  --   --   --   --   --   ?ALKPHOS 46  --   --   --   --   --   ?BILITOT 1.3*  --   --   --   --   --   ?GFRNONAA 55*   < > >60  --  >60 >60  ?ANIONGAP 9   < > 8  --  8 9  ? < > = values in this interval not displayed.  ? ?  ?Lipids  ?Recent Labs  ?Lab 06/08/21 ?0501  ?CHOL 173  ?TRIG 171*  175*  ?HDL 21*  ?LDLCALC 118*  ?CHOLHDL 8.2  ? ?  ?Hematology ?Recent Labs  ?Lab 06/10/21 ?0327 06/11/21 ?0107 06/12/21 ?0139  ?WBC 8.1 6.4 6.6  ?RBC 4.14* 4.18* 4.31  ?HGB 11.6* 11.7* 12.3*  ?HCT 35.8* 36.7* 37.1*  ?MCV 86.5 87.8 86.1  ?MCH 28.0 28.0 28.5  ?MCHC 32.4 31.9 33.2  ?RDW 13.7 14.0 13.8  ?PLT 151 144* 172  ? ? ?Thyroid No results for input(s): TSH, FREET4 in the last 168 hours.  ?BNPNo results for input(s): BNP, PROBNP in the last 168  hours.  ?DDimer No results for input(s): DDIMER in the last 168 hours.  ? ?Radiology  ?  ?Cardiac Studies  ? ?06/07/21: TTE ?IMPRESSIONS  ? 1. Left ventricular ejection fraction, by estimation, is 40 to 45%. The  ?left ventricle has mildly decreased function. The left ventricle  ?demonstrates regional wall motion abnormalities (see scoring  ?diagram/findings for description). There is severe  ?concentric left ventricular hypertrophy. Left ventricular diastolic  ?parameters are consistent with Grade I diastolic dysfunction (impaired  ?relaxation). There is severe hypokinesis of the left ventricular, entire  ?inferior wall and inferoseptal wall.  ? 2. Right ventricular systolic function is normal. The right ventricular  ?size is normal. There is normal pulmonary artery systolic pressure.  ? 3. Left atrial size was mildly dilated.  ? 4. Right atrial size was mildly dilated.  ? 5. The mitral valve is normal in structure. Moderate mitral valve  ?regurgitation.  ? 6. The aortic valve is tricuspid. Aortic valve regurgitation is not  ?visualized.  ? 7. The inferior vena cava is normal in size with <50% respiratory  ?variability, suggesting right atrial pressure of 8 mmHg.  ? 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 Cardiac interventions.  ?  ?  ?06/07/2021: aborted LHC ?  Mid LM to Dist LM lesion is 25% stenosed. ?  Ramus lesion is 80% stenosed. ?  2nd Diag lesion is 20% stenosed. ?  Prox LAD lesion is 60% stenosed. ?  Dist LAD lesion is 80% stenosed. ?  1st Mrg lesion is 80% stenosed. ?  Previously placed Mid LAD stent (unknown type) is  widely patent. ?  The left ventricular  ejection fraction is 35-45% by visual estimate. ?  ?Stroke work up in progress. ?Per wife patient had stroke symptoms wax and wane since this morning prior to arrival to ER. ?Medical treatment for now. ?  ?Cath: 11/30/2020 ?  Mid LM to Dist LM lesion is 25% stenosed. ?  Mid LAD lesion is 90% stenosed. ?  2nd  Diag lesion is 20% stenosed. ?  1st Mrg lesion is 100% stenosed. ?  Mid Cx to Dist Cx lesion is 100% stenosed. ?  Prox RCA lesion is 50% stenosed. ?  Mid RCA to Dist RCA lesion is 40% stenosed. ?  Dist RCA lesion is 65% stenosed. ?  Ramus lesion is 80% stenosed. ?  A drug-eluting stent was successfully placed using a STENT ONYX FRONTIER [id number]. ?  Post intervention, there is a 0% residual stenosis. ?  LV end diastolic pressure is normal. ?  ?3 vessel CAD. The culprit lesion is the first OM which is occluded. The distal LCx is also occluded. There is a segmental 90% mid LAD stenosis. 80% ramus intermediate which is small to moderate in size. 65% distal RCA. ?Normal LVEDP ?Successful PCI of the mid LAD with 2.75 x 38 mm Onyx Frontier with IVUS guidance ?  ?Plan: DAPT for one year. Aggressive risk factor modification. Kailynne Ferrington treat residual disease medically. Anticipate DC tomorrow.  ?  ?  ?Echo: 11/29/20 ?IMPRESSIONS  ? 1. Consider cardiac amyloid.  ? 2. Left ventricular ejection fraction, by estimation, is 35 to 40%. The  ?left ventricle has severely decreased function. The left ventricle  ?demonstrates global hypokinesis. There is severe concentric left  ?ventricular hypertrophy. Left ventricular  ?diastolic parameters are consistent with Grade II diastolic dysfunction  ?(pseudonormalization).  ? 3. Right ventricular systolic function is normal. The right ventricular  ?size is normal.  ? 4. The pericardial effusion is anterior to the right ventricle.  ? 5. The mitral valve is normal in structure. Trivial mitral valve  ?regurgitation. No evidence of mitral stenosis.  ? 6. The aortic valve is normal in structure. Aortic valve regurgitation is  ?not visualized. No aortic stenosis is present.  ? 7. The inferior vena cava is normal in size with greater than 50%  ?respiratory variability, suggesting right atrial pressure of 3 mmHg.  ? 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   ? ?Patient Profile  ?   ?51 y.o. male HTN, prior strokes (2014. 2015), HLD, smoker, CAD admitted 05/07/21   ? ?while getting ready for work having difficulty with usual tasks like getting dressed, putting on h

## 2021-06-12 NOTE — Progress Notes (Signed)
Ref: Orpah Cobb, MD ? ? ?Subjective:  ?Awake. VS stable. ?Normal sinus rhythm. ?Right sided hemiparesis and aphasia continues. ? ?Objective:  ?Vital Signs in the last 24 hours: ?Temp:  [98.2 ?F (36.8 ?C)-98.9 ?F (37.2 ?C)] 98.4 ?F (36.9 ?C) (04/30 0700) ?Pulse Rate:  [67-99] 79 (04/30 0700) ?Cardiac Rhythm: Ventricular paced (04/29 1930) ?Resp:  [0-28] 13 (04/30 0700) ?BP: (102-155)/(73-111) 126/74 (04/30 0700) ?SpO2:  [91 %-100 %] 98 % (04/30 0700) ?Weight:  [88.6 kg] 88.6 kg (04/30 0500) ? ?Physical Exam: ?BP Readings from Last 1 Encounters:  ?06/12/21 126/74  ?   ?Wt Readings from Last 1 Encounters:  ?06/12/21 88.6 kg  ?  Weight change:  Body mass index is 25.08 kg/m?. ?HEENT: [place]/AT, Eyes-Brown, Conjunctiva-Pink, Sclera-Non-icteric ?Neck: No JVD, No bruit, Trachea midline. ?Lungs:  Clear, Bilateral. ?Cardiac:  Regular rhythm, normal S1 and S2, no S3. II/VI systolic murmur. ?Abdomen:  Soft, non-tender. BS present. ?Extremities:  No edema present. No cyanosis. No clubbing. ?CNS: AxOx2 Cranial nerves grossly intact, moves left side well with minimal right sided movement on stimulation.  ?Skin: Warm and dry. ? ? ?Intake/Output from previous day: ?04/29 0701 - 04/30 0700 ?In: 993 [P.O.:600; I.V.:393] ?Out: 2600 [Urine:2600] ? ? ? ?Lab Results: ?BMET ?   ?Component Value Date/Time  ? NA 139 06/11/2021 0107  ? NA 140 06/10/2021 0327  ? NA 140 06/09/2021 0440  ? K 4.1 06/11/2021 0107  ? K 3.3 (L) 06/10/2021 0327  ? K 3.6 06/09/2021 0440  ? CL 106 06/11/2021 0107  ? CL 109 06/10/2021 0327  ? CL 109 06/09/2021 0440  ? CO2 24 06/11/2021 0107  ? CO2 23 06/10/2021 0327  ? CO2 23 06/09/2021 0440  ? GLUCOSE 126 (H) 06/11/2021 0107  ? GLUCOSE 167 (H) 06/10/2021 0327  ? GLUCOSE 142 (H) 06/09/2021 0440  ? BUN 21 (H) 06/11/2021 0107  ? BUN 10 06/10/2021 0327  ? BUN 14 06/09/2021 0440  ? CREATININE 1.32 (H) 06/11/2021 0107  ? CREATININE 0.97 06/10/2021 0327  ? CREATININE 1.04 06/09/2021 0440  ? CALCIUM 9.3 06/11/2021 0107  ?  CALCIUM 8.7 (L) 06/10/2021 0327  ? CALCIUM 8.7 (L) 06/09/2021 0440  ? GFRNONAA >60 06/11/2021 0107  ? GFRNONAA >60 06/10/2021 0327  ? GFRNONAA >60 06/09/2021 0440  ? GFRAA 79 (L) 10/25/2012 5462  ? GFRAA 86 (L) 10/24/2012 1954  ? GFRAA 90 (L) 10/24/2012 0957  ? ?CBC ?   ?Component Value Date/Time  ? WBC 6.6 06/12/2021 0139  ? RBC 4.31 06/12/2021 0139  ? HGB 12.3 (L) 06/12/2021 0139  ? HCT 37.1 (L) 06/12/2021 0139  ? PLT 172 06/12/2021 0139  ? MCV 86.1 06/12/2021 0139  ? MCH 28.5 06/12/2021 0139  ? MCHC 33.2 06/12/2021 0139  ? RDW 13.8 06/12/2021 0139  ? LYMPHSABS 0.7 06/08/2021 0501  ? MONOABS 0.9 06/08/2021 0501  ? EOSABS 0.0 06/08/2021 0501  ? BASOSABS 0.0 06/08/2021 0501  ? ?HEPATIC Function Panel ?Recent Labs  ?  11/29/20 ?1258 06/07/21 ?0903  ?PROT 7.7 6.8  ? ?HEMOGLOBIN A1C ?No components found for: HGA1C,  MPG ?CARDIAC ENZYMES ?Lab Results  ?Component Value Date  ? CKTOTAL 319 (H) 09/18/2010  ? CKMB 2.0 09/18/2010  ? TROPONINI <0.30 10/24/2012  ? TROPONINI <0.30 09/15/2012  ? TROPONINI <0.30 09/18/2010  ? ?BNP ?No results for input(s): PROBNP in the last 8760 hours. ?TSH ?Recent Labs  ?  11/30/20 ?0305  ?TSH 1.016  ? ?CHOLESTEROL ?Recent Labs  ?  11/30/20 ?0305 06/08/21 ?0501  ?  CHOL 157 173  ? ? ?Scheduled Meds: ? amLODipine  5 mg Oral Daily  ? vitamin C  500 mg Oral Daily  ? aspirin  81 mg Oral Daily  ? atorvastatin  80 mg Oral Daily  ? chlorhexidine  15 mL Mouth Rinse BID  ? Chlorhexidine Gluconate Cloth  6 each Topical Daily  ? empagliflozin  10 mg Oral Daily  ? feeding supplement (PROSource TF)  45 mL Per Tube BID  ? hydrALAZINE  10 mg Oral BID  ? hydrochlorothiazide  12.5 mg Oral Daily  ? insulin aspart  0-15 Units Subcutaneous Q4H  ? isosorbide dinitrate  10 mg Oral BID  ? losartan  100 mg Oral Daily  ? mouth rinse  15 mL Mouth Rinse BID  ? pantoprazole  40 mg Oral Daily  ? sodium chloride flush  3 mL Intravenous Q12H  ? ?Continuous Infusions: ? sodium chloride    ? sodium chloride    ? feeding  supplement (OSMOLITE 1.5 CAL) Stopped (06/11/21 0948)  ? heparin 1,600 Units/hr (06/12/21 0700)  ? ?PRN Meds:.sodium chloride, sodium chloride, acetaminophen **OR** acetaminophen (TYLENOL) oral liquid 160 mg/5 mL **OR** acetaminophen, nitroGLYCERIN, ondansetron (ZOFRAN) IV, sodium chloride flush ? ?Assessment/Plan: ? NSTEMI ?Stroke with right hemiparesis and aphasia and vision disturbance ?CAD ?S/P LAD stent ?HTN ?HLD ?HCM ?PVD ? ?Plan: ?DC heaprin and start Plavix. ? ? LOS: 5 days  ? ?Time spent including chart review, lab review, examination, discussion with patient :  min ? ? ?Orpah Cobb  MD  ?06/12/2021, 9:58 AM ? ? ? ? ?

## 2021-06-12 NOTE — Plan of Care (Signed)
?  Problem: Safety: ?Goal: Non-violent Restraint(s) ?Outcome: Progressing ?  ?Problem: Clinical Measurements: ?Goal: Respiratory complications will improve ?Outcome: Progressing ?  ?Problem: Coping: ?Goal: Level of anxiety will decrease ?Outcome: Progressing ?  ?Problem: Elimination: ?Goal: Will not experience complications related to urinary retention ?Outcome: Progressing ?  ?Problem: Activity: ?Goal: Risk for activity intolerance will decrease ?Outcome: Not Progressing ?  ?Problem: Nutrition: ?Goal: Adequate nutrition will be maintained ?Outcome: Not Progressing ?  ?Problem: Self-Care: ?Goal: Ability to communicate needs accurately will improve ?Outcome: Not Progressing ?  ?Problem: Activity: ?Goal: Ability to tolerate increased activity will improve ?Outcome: Not Progressing ?  ?

## 2021-06-12 NOTE — Progress Notes (Signed)
ANTICOAGULATION CONSULT NOTE - Follow-up Consult ? ?Pharmacy Consult for heparin ?Indication: chest pain/ACS ? ?No Known Allergies ? ?Patient Measurements: ?Height: 6\' 2"  (188 cm) ?Weight: 88.6 kg (195 lb 5.2 oz) ?IBW/kg (Calculated) : 82.2 ?Heparin Dosing Weight: 103.1 kg  ? ?Vital Signs: ?Temp: 98.4 ?F (36.9 ?C) (04/30 0700) ?Temp Source: Oral (04/30 0700) ?BP: 126/74 (04/30 0700) ?Pulse Rate: 79 (04/30 0700) ? ?Labs: ?Recent Labs  ?  06/10/21 ?0327 06/11/21 ?0107 06/11/21 ?0928 06/12/21 ?0139  ?HGB 11.6* 11.7*  --  12.3*  ?HCT 35.8* 36.7*  --  37.1*  ?PLT 151 144*  --  172  ?HEPARINUNFRC 0.40 0.24* 0.47 0.48  ?CREATININE 0.97 1.32*  --   --   ? ? ? ?Estimated Creatinine Clearance: 76.1 mL/min (A) (by C-G formula based on SCr of 1.32 mg/dL (H)). ? ? ?Assessment: ?89 YOM with NSTEMI and CVA. Will target a more conservative heparin level.    ? ?Heparin level 0.48 at goal on 1,600 units/hr. CBC stable. No bleeding or issues with infusion noted.  ?CBC stable  ? ?Goal of Therapy:  ?Heparin level 0.3-0.5 units/mL ?Monitor platelets by anticoagulation protocol: Yes ?  ?Plan:  ?Continue heparin at 1,600 units/hr  ?Daily HL and CBC  ? ? ? ?44 Pharm.D. CPP, BCPS ?Clinical Pharmacist ?[phone] ?06/12/2021 8:08 AM  ? ? ? ? ? ?

## 2021-06-13 LAB — CBC
HCT: 39.3 % (ref 39.0–52.0)
Hemoglobin: 12.8 g/dL — ABNORMAL LOW (ref 13.0–17.0)
MCH: 28.2 pg (ref 26.0–34.0)
MCHC: 32.6 g/dL (ref 30.0–36.0)
MCV: 86.6 fL (ref 80.0–100.0)
Platelets: 218 10*3/uL (ref 150–400)
RBC: 4.54 MIL/uL (ref 4.22–5.81)
RDW: 13.7 % (ref 11.5–15.5)
WBC: 6.8 10*3/uL (ref 4.0–10.5)
nRBC: 0 % (ref 0.0–0.2)

## 2021-06-13 LAB — BASIC METABOLIC PANEL
Anion gap: 10 (ref 5–15)
BUN: 20 mg/dL (ref 6–20)
CO2: 26 mmol/L (ref 22–32)
Calcium: 9.2 mg/dL (ref 8.9–10.3)
Chloride: 100 mmol/L (ref 98–111)
Creatinine, Ser: 1.28 mg/dL — ABNORMAL HIGH (ref 0.61–1.24)
GFR, Estimated: >60 mL/min (ref >60)
Glucose, Bld: 127 mg/dL — ABNORMAL HIGH (ref 70–99)
Potassium: 4.2 mmol/L (ref 3.5–5.1)
Sodium: 136 mmol/L (ref 135–145)

## 2021-06-13 LAB — GLUCOSE, CAPILLARY
Glucose-Capillary: 103 mg/dL — ABNORMAL HIGH (ref 70–99)
Glucose-Capillary: 112 mg/dL — ABNORMAL HIGH (ref 70–99)
Glucose-Capillary: 122 mg/dL — ABNORMAL HIGH (ref 70–99)
Glucose-Capillary: 122 mg/dL — ABNORMAL HIGH (ref 70–99)
Glucose-Capillary: 127 mg/dL — ABNORMAL HIGH (ref 70–99)
Glucose-Capillary: 130 mg/dL — ABNORMAL HIGH (ref 70–99)

## 2021-06-13 MED ORDER — ENSURE ENLIVE PO LIQD
237.0000 mL | Freq: Three times a day (TID) | ORAL | Status: DC
  Administered 2021-06-13 – 2021-06-14 (×3): 237 mL via ORAL

## 2021-06-13 NOTE — Progress Notes (Signed)
Physical Therapy Treatment ?Patient Details ?Name: Henry Simmons ?MRN: [id number] ?DOB: 21-Nov-1970 ?Today's Date: 06/13/2021 ? ? ?History of Present Illness 51 yo male presented to ED on 4/25 with a chief complaint of weakness and confusion. Found to be bradycardic in 2nd degree heart block, with elevated trop, EKG suggested recent inferior wall MI vs STEMI.  During the cath he was noted to be agitated, aphasic and weak on the R side.  CT without acute findings, was concern for LVO, so was taken to IR where a temporary Pacer was placed. Intubated 4/25-4/26. CT revealed small old left pericallosal infarct, small old infarct in the anterior left frontal lobe, and old bilateral basal ganglia lacunar infarcts. Repeat CT of head 4/27 revealed moderately large area of infarct in the left posterior frontal lobe extending down to the frontal operculum and insula. PMH including CAD, HTN, stroke 2014, ischemic cm, hyperlipidemia, and tobacco abuse. ? ?  ?PT Comments  ? ? Pt making excellent progress towards his physical therapy goals, exhibiting good motivation and activity tolerance throughout session today. Focus of session on bed mobility, transfer and pre gait training, strengthening, and static/dynamic sitting balance. Pt requiring up to two person moderate assist for functional mobility. Stood from edge of bed x 3 with right knee block. Emphasis on midline posture and right sided attention. Pt demonstrating continued right hemiparesis, neglect, diplopia (occluded glasses donned), left sided apraxia, and aphasia. Pt is an excellent candidate for AIR based on age, PLOF, motivation. ?   ?Recommendations for follow up therapy are one component of a multi-disciplinary discharge planning process, led by the attending physician.  Recommendations may be updated based on patient status, additional functional criteria and insurance authorization. ? ?Follow Up Recommendations ? Acute inpatient rehab (3hours/day) ?  ?  ?Assistance  Recommended at Discharge Frequent or constant Supervision/Assistance  ?Patient can return home with the following Two people to help with walking and/or transfers;Two people to help with bathing/dressing/bathroom;Assistance with cooking/housework;Direct supervision/assist for medications management;Assistance with feeding;Direct supervision/assist for financial management;Assist for transportation;Help with stairs or ramp for entrance ?  ?Equipment Recommendations ? BSC/3in1;Wheelchair (measurements PT);Wheelchair cushion (measurements PT)  ?  ?Recommendations for Other Services   ? ? ?  ?Precautions / Restrictions Precautions ?Precautions: Fall;Other (comment) ?Precaution Comments: R hemiparesis, neglect, diplopia ?Restrictions ?Weight Bearing Restrictions: No  ?  ? ?Mobility ? Bed Mobility ?Overal bed mobility: Needs Assistance ?Bed Mobility: Supine to Sit, Sit to Supine ?  ?  ?Supine to sit: Mod assist, +2 for safety/equipment ?Sit to supine: Min assist, +2 for safety/equipment ?  ?General bed mobility comments: Pt initiating well, exiting towards left side of bed, assist for RLE and trunk to midline. MinA for LE management back into bed (+2 safety due to decreased eccentric control to guard head from hitting bed rail) ?  ? ?Transfers ?Overall transfer level: Needs assistance ?Equipment used: None ?Transfers: Sit to/from Stand ?Sit to Stand: Mod assist, +2 physical assistance ?  ?  ?  ?  ?  ?General transfer comment: ModA + 2 to rise from edge of bed x 3, right knee block provided with min-mod buckle. Tactile facilitation for R quad activation, midline. Verbal cues for cervical extension. Pt performing weight shifting x 10 reps to R/L ?  ? ?Ambulation/Gait ?  ?  ?  ?  ?  ?  ?  ?General Gait Details: unable currently ? ? ?Stairs ?  ?  ?  ?  ?  ? ? ?Wheelchair Mobility ?  ? ?  Modified Rankin (Stroke Patients Only) ?Modified Rankin (Stroke Patients Only) ?Pre-Morbid Rankin Score: No symptoms ?Modified Rankin: Severe  disability ? ? ?  ?Balance Overall balance assessment: Needs assistance ?Sitting-balance support: No upper extremity supported, Feet supported ?Sitting balance-Leahy Scale: Poor ?Sitting balance - Comments: Pt requiring min guard-modA, tendency for right lateral lean. Able to correct with multimodal cueing but worsens with fatigue ?  ?Standing balance support: Single extremity supported ?Standing balance-Leahy Scale: Poor ?Standing balance comment: Min-modA +2 for static standing balance ?  ?  ?  ?  ?  ?  ?  ?  ?  ?  ?  ?  ? ?  ?Cognition Arousal/Alertness: Awake/alert ?Behavior During Therapy: Flat affect ?Overall Cognitive Status: Difficult to assess ?  ?  ?  ?  ?  ?  ?  ?  ?  ?  ?  ?  ?  ?  ?  ?  ?General Comments: Pt nonverbal, inaccurate with yes/no responses, follows left sided commands. appears very motivated ?  ?  ? ?  ?Exercises General Exercises - Lower Extremity ?Heel Slides: PROM, Right, 5 reps, Supine ?Other Exercises ?Other Exercises: Supine: bridging x 3 with RLE tactile facilitation ?Other Exercises: Sitting: functional reaching with LUE and then bimanual task with assist for RUE grip, lateral leans onto R elbow x 3 ? ?  ?General Comments   ?  ?  ? ?Pertinent Vitals/Pain Pain Assessment ?Pain Assessment: Faces ?Faces Pain Scale: No hurt  ? ? ?Home Living   ?  ?  ?  ?  ?  ?  ?  ?  ?  ?   ?  ?Prior Function    ?  ?  ?   ? ?PT Goals (current goals can now be found in the care plan section) Acute Rehab PT Goals ?Patient Stated Goal: unable to state ?Potential to Achieve Goals: Good ?Progress towards PT goals: Progressing toward goals ? ?  ?Frequency ? ? ? Min 4X/week ? ? ? ?  ?PT Plan Current plan remains appropriate  ? ? ?Co-evaluation PT/OT/SLP Co-Evaluation/Treatment: Yes ?Reason for Co-Treatment: Necessary to address cognition/behavior during functional activity;For patient/therapist safety;To address functional/ADL transfers ?PT goals addressed during session: Mobility/safety with  mobility;Balance;Strengthening/ROM ?  ?  ? ?  ?AM-PAC PT "6 Clicks" Mobility   ?Outcome Measure ? Help needed turning from your back to your side while in a flat bed without using bedrails?: A Lot ?Help needed moving from lying on your back to sitting on the side of a flat bed without using bedrails?: A Lot ?Help needed moving to and from a bed to a chair (including a wheelchair)?: A Lot ?Help needed standing up from a chair using your arms (e.g., wheelchair or bedside chair)?: A Lot ?Help needed to walk in hospital room?: Total ?Help needed climbing 3-5 steps with a railing? : Total ?6 Click Score: 10 ? ?  ?End of Session Equipment Utilized During Treatment: Gait belt ?Activity Tolerance: Patient tolerated treatment well ?Patient left: in bed;with call bell/phone within reach;with bed alarm set (in chair position) ?Nurse Communication: Mobility status ?PT Visit Diagnosis: Muscle weakness (generalized) (M62.81);Difficulty in walking, not elsewhere classified (R26.2);Apraxia (R48.2);Other symptoms and signs involving the nervous system (R29.898) ?  ? ? ?Time: 6283-6629 ?PT Time Calculation (min) (ACUTE ONLY): 29 min ? ?Charges:  $Therapeutic Activity: 8-22 mins          ?          ? ?Lillia Pauls, PT, DPT ?Acute Rehabilitation  Services ?Pager [phone] ?Office [phone] ? ? ? ?Carloine Ernestina Penna Brown ?06/13/2021, 9:11 AM ? ?

## 2021-06-13 NOTE — Progress Notes (Signed)
Occupational Therapy Treatment ?Patient Details ?Name: Henry Simmons ?MRN: [id number] ?DOB: October 22, 1970 ?Today's Date: 06/13/2021 ? ? ?History of present illness 51 yo male presented to ED on 4/25 with a chief complaint of weakness and confusion. Found to be bradycardic in 2nd degree heart block, with elevated trop, EKG suggested recent inferior wall MI vs STEMI.  During the cath he was noted to be agitated, aphasic and weak on the R side.  CT without acute findings, was concern for LVO, so was taken to IR where a temporary Pacer was placed. Intubated 4/25-4/26. CT revealed small old left pericallosal infarct, small old infarct in the anterior left frontal lobe, and old bilateral basal ganglia lacunar infarcts. Repeat CT of head 4/27 revealed moderately large area of infarct in the left posterior frontal lobe extending down to the frontal operculum and insula. PMH including CAD, HTN, stroke 2014, ischemic cm, hyperlipidemia, and tobacco abuse. ?  ?OT comments ? Patient continues to make steady progress towards goals in skilled OT session. Patient's session encompassed neuro-muscular re-education, further assessment of vision and cognition, and PROM to RUE. Patient continues to demonstrate diploplia as evidenced by keeping L eye closed upon OT and PT entry into room. Patient able to complete reaching task with minimal undershooting and overshooting on EOB with occlusion glasses provided. Patient noted to have trace activation at the R trapezius, but does not recoil to pain stimuli provided at nailbed of RUE (does respond on R toe). Patient continues to have no vocalizations and inconsistent nodding (always nodding yes) when prompted with simple responses and demonstrates apraxic movement with LUE as when each prompt is provided, patient elevates LUE in response. With max cues, patient is able to attempt motor planning sequence to attempt to align in midline sitting EOB, or completing tasks in standing (mod of 2 to come  into standing) such as swaying back and forth to promote weight bearing into RLE. Patient left in chair position at end of session. OT continuing to recommending AIR placement due to multi-systematic involvement and great rehab potential. OT will continue to follow acutely.   ? ?Recommendations for follow up therapy are one component of a multi-disciplinary discharge planning process, led by the attending physician.  Recommendations may be updated based on patient status, additional functional criteria and insurance authorization. ?   ?Follow Up Recommendations ? Acute inpatient rehab (3hours/day)  ?  ?Assistance Recommended at Discharge Frequent or constant Supervision/Assistance  ?Patient can return home with the following ? Two people to help with walking and/or transfers;Two people to help with bathing/dressing/bathroom;Direct supervision/assist for medications management;Help with stairs or ramp for entrance;Assistance with feeding;Assistance with cooking/housework;Direct supervision/assist for financial management;Assist for transportation ?  ?Equipment Recommendations ? Other (comment) (Defer to next venue)  ?  ?Recommendations for Other Services   ? ?  ?Precautions / Restrictions Precautions ?Precautions: Fall;Other (comment) ?Precaution Comments: R hemiparesis, neglect, diplopia ?Restrictions ?Weight Bearing Restrictions: No  ? ? ?  ? ?Mobility Bed Mobility ?Overal bed mobility: Needs Assistance ?Bed Mobility: Supine to Sit, Sit to Supine ?  ?  ?Supine to sit: Mod assist, +2 for safety/equipment ?Sit to supine: Min assist, +2 for safety/equipment ?  ?General bed mobility comments: Pt initiating well, exiting towards left side of bed, assist for RLE and trunk to midline. MinA for LE management back into bed (+2 safety due to decreased eccentric control to guard head from hitting bed rail) ?  ? ?Transfers ?Overall transfer level: Needs assistance ?Equipment used: None ?Transfers: Sit  to/from Stand ?Sit to  Stand: Mod assist, +2 physical assistance ?  ?  ?  ?  ?  ?General transfer comment: ModA + 2 to rise from edge of bed x 3, right knee block provided with min-mod buckle. Tactile facilitation for R quad activation, midline. Verbal cues for cervical extension. Pt performing weight shifting x 10 reps to R/L ?  ?  ?Balance Overall balance assessment: Needs assistance ?Sitting-balance support: No upper extremity supported, Feet supported ?Sitting balance-Leahy Scale: Poor ?Sitting balance - Comments: Pt requiring min guard-modA, tendency for right lateral lean. Able to correct with multimodal cueing but worsens with fatigue ?  ?Standing balance support: Single extremity supported ?Standing balance-Leahy Scale: Poor ?Standing balance comment: Min-modA +2 for static standing balance ?  ?  ?  ?  ?  ?  ?  ?  ?  ?  ?  ?   ? ?ADL either performed or assessed with clinical judgement  ? ?ADL Overall ADL's : Needs assistance/impaired ?  ?  ?  ?  ?  ?  ?  ?  ?  ?  ?  ?  ?  ?  ?  ?  ?  ?  ?Functional mobility during ADLs: Maximal assistance ?General ADL Comments: Remains max A for ADLs, often attempting to raise his L arm in response to all cues, session focus primarily on neuromuscular re-ed sitting EOB ?  ? ?Extremity/Trunk Assessment   ?  ?  ?  ?  ?  ? ?Vision Patient Visual Report: Diplopia ?Alignment/Gaze Preference: Gaze left;Head turned ?Visual Fields: Right visual field deficit ?Diplopia Assessment: Other (comment) (Continues to close L eye when occlusion glasses are off. Confirming glasses improve diplopia) ?Additional Comments: Occlusion glasses continue to provide benefit, able to grab item with L hand with minimal overshooting and undershooting with glasses on, improved horizontal tracking crossing midline when assessed ?  ?Perception   ?  ?Praxis   ?  ? ?Cognition Arousal/Alertness: Awake/alert ?Behavior During Therapy: Flat affect ?Overall Cognitive Status: Difficult to assess ?  ?  ?  ?  ?  ?  ?  ?  ?  ?  ?  ?  ?  ?   ?  ?  ?General Comments: Pt nonverbal, inaccurate with yes/no responses, follows left sided commands. appears very motivated ?  ?  ?   ?Exercises General Exercises - Upper Extremity ?Elbow Flexion: PROM, Right, 10 reps ?Elbow Extension: PROM, Right, 10 reps ?Digit Composite Flexion: PROM, Both, 10 reps ?Composite Extension: PROM, Both, 10 reps ?Other Exercises ?Other Exercises: Supine: bridging x 3 with RLE tactile facilitation ?Other Exercises: Sitting: functional reaching with LUE and then bimanual task with assist for RUE grip, lateral leans onto R elbow x 3 ? ?  ?Shoulder Instructions   ? ? ?  ?General Comments    ? ? ?Pertinent Vitals/ Pain       Pain Assessment ?Pain Assessment: Faces ?Faces Pain Scale: No hurt ? ?Home Living   ?  ?  ?  ?  ?  ?  ?  ?  ?  ?  ?  ?  ?  ?  ?  ?  ?  ?  ? ?  ?Prior Functioning/Environment    ?  ?  ?  ?   ? ?Frequency ? Min 3X/week  ? ? ? ? ?  ?Progress Toward Goals ? ?OT Goals(current goals can now be found in the care plan section) ? Progress towards OT goals: Progressing  toward goals ? ?Acute Rehab OT Goals ?Patient Stated Goal: remains motivated (unable to vocalize of yet) ?OT Goal Formulation: With patient ?Time For Goal Achievement: 06/23/21 ?Potential to Achieve Goals: Good  ?Plan Discharge plan remains appropriate   ? ?Co-evaluation ? ? ?   ?  ?  ?OT goals addressed during session: ADL's and self-care;Strengthening/ROM ?  ? ?  ?AM-PAC OT "6 Clicks" Daily Activity     ?Outcome Measure ? ? Help from another person eating meals?: A Little ?Help from another person taking care of personal grooming?: A Little ?Help from another person toileting, which includes using toliet, bedpan, or urinal?: A Lot ?Help from another person bathing (including washing, rinsing, drying)?: A Lot ?Help from another person to put on and taking off regular upper body clothing?: A Lot ?Help from another person to put on and taking off regular lower body clothing?: Total ?6 Click Score: 13 ? ?  ?End of  Session Equipment Utilized During Treatment: Gait belt ? ?OT Visit Diagnosis: Unsteadiness on feet (R26.81);Other abnormalities of gait and mobility (R26.89);Muscle weakness (generalized) (M62.81) ?  ?Act

## 2021-06-13 NOTE — Care Management Note (Signed)
I removed Henry Simmons's coretrak tube per verbal order from Dr Orpah Cobb. ? ?[name], RN ?

## 2021-06-13 NOTE — Progress Notes (Signed)
Speech Language Pathology Treatment: Dysphagia;Cognitive-Linquistic  ?Patient Details ?Name: Henry Simmons ?MRN: [id number] ?DOB: 12/04/1970 ?Today's Date: 06/13/2021 ?Time: 1027-2536 ?SLP Time Calculation (min) (ACUTE ONLY): 30 min ? ?Assessment / Plan / Recommendation ?Clinical Impression ? SWALLOWING ?Pt tolerating diet per RN report.  Today pt tolerated all consistencies trailed. There were no clinical s/s of aspiration with regular solid, nectar thick liquid by straw, or thin liquid by straw. There was mild-moderate palatal residue with regular solid.  Pt was able to clear independently with cuing and additional time.  Pt exhibited some impulsivity with feeding, taking large bites.  Pt had some anterior loss on R side with regular solid.  With verbal and tactile cuing to alert pt to bolus loss, pt was able to manipulate cracker back into oral cavity with lips and tongue.  Although regular textures do not necessarily appear unsafe, recommend continuing mechanical soft solids at this time for ease of intake and to minimize oral residuals.  Given that pt was observed to be sensate to penetration during FEES on 4/28 and there were no clinical s/s of aspiration with trials of thin liquid, recommend advancing pt to thin liquid. ? ?Recommend mechanical soft solids with thin liquid. ? ?COMMUNICATION ?Pt continues to present with severe expressive language impairment.  Pt vocalized x1 with SLP today at end of session, when SLP returned for forgotten item and pt laughed.  Pt's receptive language appears relatively spared in comparison, but pt also has difficulty demonstrated receptive language competency.  With Y/N questions, it is difficult to establish reliable response method.  Pt has a good head nod, but does not shake head.  Thumbs up/down trials were unsuccessful.  Finger squeeze was unsuccessful.  At present, best method for communication appears to be paired yes/no questions (see communication strategies below for  additional information).  Pt will answer positively with head nod; he does nothing for a negative response. With object identification, pt performed at 1 above chance.  Pt responded by selecting item on L on all but 1 trial where he correctly identified item on R.  Suspect visual issues.  Pt needed significant verbal and visual cuing to attend to left side with SLP.  Pt followed 1 step directions with greater facility.  Pt completed direction with 80% accuracy with tactile cuing.  Suspect that apraxia may interfere with ability to execute commands.  Pt demonstrates excellent communicative intent through facial expressions.  He appears motivated and is an excellent candidate for IPR.   ? ?Communication Strategies ?Use paired yes/no questions with opposite meaning to establish pt response.  A lack of response may indicate a negative answer, but to confirm, please ask opposite question to confirm with a positive response (head nod).  Ex: Are you hot?/Are you cold?; Are you in pain?/Are you comfortable?; Are you full?/Are you hungry. ? ?Cue pt to attend to R side with verbal, visual, and tactile cuing.  Pt benefited from motion on R side to locate target item that he could not find when it was still.  Pt benefited from verbal instruction and touch on R side to locate and manipulate bolus on R side of mouth. ?  ?HPI HPI: 51 yo man with CAD, HTN, stroke 2014, ischemic cm, hyperlipidemia, and tobacco abuse, presented with a chief complaint of weakness and confusion. Found to be bradycardic in 2nd degree heart block, with elevated trop, EKG suggested recent inferior wall MI vs STEMI.  During the cath he was noted to be agitated, aphasic and  weak on the R side.  CT without acute findings, was concern for LVO, so was taken to IR where a temporary Pacer was placed. Intubated 1 day. CT revealed small old left pericallosal infarct, small old infarct in the anterior left frontal lobe, and old bilateral basal ganglia lacunar  infarcts. Dr. Pearlean Brownie impression as follows: "Possible left MCA ,right MCA and brainstem infarcts likely secondary cardiac catheterization in the setting of extensive intracranial stenosis". ?  ?   ?SLP Plan ? Continue with current plan of care ? ?  ?  ?Recommendations for follow up therapy are one component of a multi-disciplinary discharge planning process, led by the attending physician.  Recommendations may be updated based on patient status, additional functional criteria and insurance authorization. ?  ? ?Recommendations  ?Diet recommendations: Dysphagia 3 (mechanical soft);Thin liquid ?Liquids provided via: Straw ?Medication Administration: Whole meds with liquid ?Supervision: Intermittent supervision to cue for compensatory strategies ?Compensations: Slow rate;Small sips/bites ?Postural Changes and/or Swallow Maneuvers: Seated upright 90 degrees  ?   ?  Communication strategies: paired y/n questions; verbal/tactile/visual cuing (see explanation above)  ?   ? ? ? ? Oral Care Recommendations: Oral care BID ?Follow Up Recommendations: Acute inpatient rehab (3hours/day) ?Assistance recommended at discharge: Frequent or constant Supervision/Assistance ?SLP Visit Diagnosis: Dysphagia, oropharyngeal phase (R13.12);Apraxia (R48.2);Aphasia (R47.01) ?Plan: Continue with current plan of care ? ? ? ? ?  ?  ? ? ?Lorie Cleckley E Vadhir Mcnay, MA, CCC-SLP ?Acute Rehabilitation Services ?Office: [phone] ?06/13/2021, 10:58 AM ?
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
## 2021-06-13 NOTE — Progress Notes (Signed)
Ref: Orpah Cobb, MD ? ? ?Subjective:  ?Awake. VS stable with sinus rhythm. ?Oral intake good per nurse. ? ?Objective:  ?Vital Signs in the last 24 hours: ?Temp:  [98.4 ?F (36.9 ?C)-98.7 ?F (37.1 ?C)] 98.5 ?F (36.9 ?C) (05/01 0417) ?Pulse Rate:  [75-97] 97 (05/01 0417) ?Cardiac Rhythm: Normal sinus rhythm;Bundle branch block (05/01 0739) ?Resp:  [10-20] 20 (05/01 0417) ?BP: (124-146)/(85-98) 146/98 (05/01 0417) ?SpO2:  [95 %-100 %] 100 % (05/01 0417) ?Weight:  [87.6 kg] 87.6 kg (05/01 0417) ? ?Physical Exam: ?BP Readings from Last 1 Encounters:  ?06/13/21 (!) 146/98  ?   ?Wt Readings from Last 1 Encounters:  ?06/13/21 87.6 kg  ?  Weight change: -1 kg Body mass index is 24.8 kg/m?. ?HEENT: [place]/AT, Eyes-Brown, Conjunctiva-Pink, Sclera-Non-icteric ?Neck: No JVD, No bruit, Trachea midline. ?Lungs:  Clear, Bilateral. ?Cardiac:  Regular rhythm, normal S1 and S2, no S3. II/VI systolic murmur. ?Abdomen:  Soft, non-tender. BS present. ?Extremities:  No edema present. No cyanosis. No clubbing. ?CNS: AxOx3, Cranial nerves grossly intact, right sided hemiplegia continues.[name]  ?Skin: Warm and dry. ? ? ?Intake/Output from previous day: ?04/30 0701 - 05/01 0700 ?In: 647.8 [P.O.:600; I.V.:47.8] ?Out: 2100 [Urine:2100] ? ? ? ?Lab Results: ?BMET ?   ?Component Value Date/Time  ? NA 136 06/13/2021 0028  ? NA 139 06/11/2021 0107  ? NA 140 06/10/2021 0327  ? K 4.2 06/13/2021 0028  ? K 4.1 06/11/2021 0107  ? K 3.3 (L) 06/10/2021 0327  ? CL 100 06/13/2021 0028  ? CL 106 06/11/2021 0107  ? CL 109 06/10/2021 0327  ? CO2 26 06/13/2021 0028  ? CO2 24 06/11/2021 0107  ? CO2 23 06/10/2021 0327  ? GLUCOSE 127 (H) 06/13/2021 0028  ? GLUCOSE 126 (H) 06/11/2021 0107  ? GLUCOSE 167 (H) 06/10/2021 0327  ? BUN 20 06/13/2021 0028  ? BUN 21 (H) 06/11/2021 0107  ? BUN 10 06/10/2021 0327  ? CREATININE 1.28 (H) 06/13/2021 0028  ? CREATININE 1.32 (H) 06/11/2021 0107  ? CREATININE 0.97 06/10/2021 0327  ? CALCIUM 9.2 06/13/2021 0028  ? CALCIUM 9.3 06/11/2021  0107  ? CALCIUM 8.7 (L) 06/10/2021 0327  ? GFRNONAA >60 06/13/2021 0028  ? GFRNONAA >60 06/11/2021 0107  ? GFRNONAA >60 06/10/2021 0327  ? GFRAA 79 (L) 10/25/2012 2878  ? GFRAA 86 (L) 10/24/2012 1954  ? GFRAA 90 (L) 10/24/2012 0957  ? ?CBC ?   ?Component Value Date/Time  ? WBC 6.8 06/13/2021 0028  ? RBC 4.54 06/13/2021 0028  ? HGB 12.8 (L) 06/13/2021 0028  ? HCT 39.3 06/13/2021 0028  ? PLT 218 06/13/2021 0028  ? MCV 86.6 06/13/2021 0028  ? MCH 28.2 06/13/2021 0028  ? MCHC 32.6 06/13/2021 0028  ? RDW 13.7 06/13/2021 0028  ? LYMPHSABS 0.7 06/08/2021 0501  ? MONOABS 0.9 06/08/2021 0501  ? EOSABS 0.0 06/08/2021 0501  ? BASOSABS 0.0 06/08/2021 0501  ? ?HEPATIC Function Panel ?Recent Labs  ?  11/29/20 ?1258 06/07/21 ?0903  ?PROT 7.7 6.8  ? ?HEMOGLOBIN A1C ?No components found for: HGA1C,  MPG ?CARDIAC ENZYMES ?Lab Results  ?Component Value Date  ? CKTOTAL 319 (H) 09/18/2010  ? CKMB 2.0 09/18/2010  ? TROPONINI <0.30 10/24/2012  ? TROPONINI <0.30 09/15/2012  ? TROPONINI <0.30 09/18/2010  ? ?BNP ?No results for input(s): PROBNP in the last 8760 hours. ?TSH ?Recent Labs  ?  11/30/20 ?0305  ?TSH 1.016  ? ?CHOLESTEROL ?Recent Labs  ?  11/30/20 ?0305 06/08/21 ?0501  ?CHOL 157 173  ? ? ?  Scheduled Meds: ? amLODipine  5 mg Oral Daily  ? vitamin C  500 mg Oral Daily  ? aspirin  81 mg Oral Daily  ? atorvastatin  80 mg Oral Daily  ? chlorhexidine  15 mL Mouth Rinse BID  ? Chlorhexidine Gluconate Cloth  6 each Topical Daily  ? clopidogrel  75 mg Oral Daily  ? empagliflozin  10 mg Oral Daily  ? enoxaparin (LOVENOX) injection  40 mg Subcutaneous Q24H  ? feeding supplement  237 mL Oral TID BM  ? hydrALAZINE  10 mg Oral BID  ? hydrochlorothiazide  12.5 mg Oral Daily  ? insulin aspart  0-15 Units Subcutaneous Q4H  ? isosorbide dinitrate  10 mg Oral BID  ? losartan  100 mg Oral Daily  ? mouth rinse  15 mL Mouth Rinse BID  ? pantoprazole  40 mg Oral Daily  ? sodium chloride flush  3 mL Intravenous Q12H  ? ?Continuous Infusions: ? sodium  chloride    ? sodium chloride    ? ?PRN Meds:.sodium chloride, sodium chloride, acetaminophen **OR** acetaminophen (TYLENOL) oral liquid 160 mg/5 mL **OR** acetaminophen, nitroGLYCERIN, ondansetron (ZOFRAN) IV, sodium chloride flush ? ?Assessment/Plan: ? NSTEMI ?Stroke with right hemiplegia ?Aphasia and vision disturbance ?CAD ?S/P LAD ?HTN ?HLD ?HCM ?PVD  ? ?Plan: ?Awaiting rehab admission. ?May DC NG feeding tube. ? ? LOS: 6 days  ? ?Time spent including chart review, lab review, examination, discussion with patient :  min ? ? ?Orpah Cobb  MD  ?06/13/2021, 4:04 PM ? ? ? ? ?

## 2021-06-13 NOTE — Progress Notes (Signed)
Inpatient Rehabilitation Admissions Coordinator  ? ?I met with patient , his wife and his Mom at bedside. We discussed goals and expectations of a possible Cir admit. They prefer CIR. I await updated OT notes from today and then will begin insurance Auth with [place] [facility] for CIR admit. ? ?Danne Baxter, RN, MSN ?Rehab Admissions Coordinator ?([phone] ?06/13/2021 12:38 PM ? ?

## 2021-06-14 ENCOUNTER — Other Ambulatory Visit: Payer: Self-pay

## 2021-06-14 ENCOUNTER — Inpatient Hospital Stay (HOSPITAL_COMMUNITY)
Admission: RE | Admit: 2021-06-14 | Discharge: 2021-07-07 | DRG: 057 | Disposition: A | Discharge status: Home / Self Care | Payer: 59 | Source: Intra-hospital | Attending: Physical Medicine & Rehabilitation | Admitting: Physical Medicine & Rehabilitation

## 2021-06-14 DIAGNOSIS — I639 Cerebral infarction, unspecified: Secondary | ICD-10-CM | POA: Diagnosis present

## 2021-06-14 DIAGNOSIS — I69320 Aphasia following cerebral infarction: Secondary | ICD-10-CM | POA: Diagnosis not present

## 2021-06-14 DIAGNOSIS — Z87891 Personal history of nicotine dependence: Secondary | ICD-10-CM

## 2021-06-14 DIAGNOSIS — E785 Hyperlipidemia, unspecified: Secondary | ICD-10-CM | POA: Diagnosis present

## 2021-06-14 DIAGNOSIS — Z79899 Other long term (current) drug therapy: Secondary | ICD-10-CM

## 2021-06-14 DIAGNOSIS — Z7902 Long term (current) use of antithrombotics/antiplatelets: Secondary | ICD-10-CM

## 2021-06-14 DIAGNOSIS — R1313 Dysphagia, pharyngeal phase: Secondary | ICD-10-CM | POA: Diagnosis present

## 2021-06-14 DIAGNOSIS — I69351 Hemiplegia and hemiparesis following cerebral infarction affecting right dominant side: Principal | ICD-10-CM

## 2021-06-14 DIAGNOSIS — I422 Other hypertrophic cardiomyopathy: Secondary | ICD-10-CM | POA: Diagnosis present

## 2021-06-14 DIAGNOSIS — N183 Chronic kidney disease, stage 3 unspecified: Secondary | ICD-10-CM | POA: Diagnosis present

## 2021-06-14 DIAGNOSIS — Z8249 Family history of ischemic heart disease and other diseases of the circulatory system: Secondary | ICD-10-CM | POA: Diagnosis not present

## 2021-06-14 DIAGNOSIS — Z7982 Long term (current) use of aspirin: Secondary | ICD-10-CM | POA: Diagnosis not present

## 2021-06-14 DIAGNOSIS — I10 Essential (primary) hypertension: Secondary | ICD-10-CM | POA: Diagnosis not present

## 2021-06-14 DIAGNOSIS — I69390 Apraxia following cerebral infarction: Secondary | ICD-10-CM | POA: Diagnosis not present

## 2021-06-14 DIAGNOSIS — I69391 Dysphagia following cerebral infarction: Secondary | ICD-10-CM

## 2021-06-14 DIAGNOSIS — I63512 Cerebral infarction due to unspecified occlusion or stenosis of left middle cerebral artery: Secondary | ICD-10-CM

## 2021-06-14 DIAGNOSIS — Z955 Presence of coronary angioplasty implant and graft: Secondary | ICD-10-CM

## 2021-06-14 DIAGNOSIS — I252 Old myocardial infarction: Secondary | ICD-10-CM | POA: Diagnosis not present

## 2021-06-14 DIAGNOSIS — I129 Hypertensive chronic kidney disease with stage 1 through stage 4 chronic kidney disease, or unspecified chronic kidney disease: Secondary | ICD-10-CM | POA: Diagnosis present

## 2021-06-14 DIAGNOSIS — I2510 Atherosclerotic heart disease of native coronary artery without angina pectoris: Secondary | ICD-10-CM | POA: Diagnosis present

## 2021-06-14 DIAGNOSIS — R4701 Aphasia: Secondary | ICD-10-CM | POA: Diagnosis not present

## 2021-06-14 DIAGNOSIS — K5900 Constipation, unspecified: Secondary | ICD-10-CM | POA: Diagnosis not present

## 2021-06-14 DIAGNOSIS — R1311 Dysphagia, oral phase: Secondary | ICD-10-CM | POA: Diagnosis not present

## 2021-06-14 DIAGNOSIS — G8191 Hemiplegia, unspecified affecting right dominant side: Secondary | ICD-10-CM

## 2021-06-14 DIAGNOSIS — M79603 Pain in arm, unspecified: Secondary | ICD-10-CM | POA: Diagnosis not present

## 2021-06-14 DIAGNOSIS — R1310 Dysphagia, unspecified: Secondary | ICD-10-CM | POA: Diagnosis not present

## 2021-06-14 LAB — CREATININE, SERUM
Creatinine, Ser: 1.39 mg/dL — ABNORMAL HIGH (ref 0.61–1.24)
GFR, Estimated: >60 mL/min (ref >60)

## 2021-06-14 LAB — CBC
HCT: 39.9 % (ref 39.0–52.0)
HCT: 38.4 % — ABNORMAL LOW (ref 39.0–52.0)
Hemoglobin: 13 g/dL (ref 13.0–17.0)
Hemoglobin: 12.7 g/dL — ABNORMAL LOW (ref 13.0–17.0)
MCH: 28 pg (ref 26.0–34.0)
MCH: 28.5 pg (ref 26.0–34.0)
MCHC: 32.6 g/dL (ref 30.0–36.0)
MCHC: 33.1 g/dL (ref 30.0–36.0)
MCV: 86 fL (ref 80.0–100.0)
MCV: 86.3 fL (ref 80.0–100.0)
Platelets: 259 10*3/uL (ref 150–400)
Platelets: 281 10*3/uL (ref 150–400)
RBC: 4.64 MIL/uL (ref 4.22–5.81)
RBC: 4.45 MIL/uL (ref 4.22–5.81)
RDW: 13.8 % (ref 11.5–15.5)
RDW: 13.8 % (ref 11.5–15.5)
WBC: 6.3 10*3/uL (ref 4.0–10.5)
WBC: 6.5 10*3/uL (ref 4.0–10.5)
nRBC: 0 % (ref 0.0–0.2)
nRBC: 0 % (ref 0.0–0.2)

## 2021-06-14 LAB — BASIC METABOLIC PANEL
Anion gap: 12 (ref 5–15)
BUN: 28 mg/dL — ABNORMAL HIGH (ref 6–20)
CO2: 24 mmol/L (ref 22–32)
Calcium: 9.6 mg/dL (ref 8.9–10.3)
Chloride: 100 mmol/L (ref 98–111)
Creatinine, Ser: 1.29 mg/dL — ABNORMAL HIGH (ref 0.61–1.24)
GFR, Estimated: >60 mL/min (ref >60)
Glucose, Bld: 116 mg/dL — ABNORMAL HIGH (ref 70–99)
Potassium: 4.3 mmol/L (ref 3.5–5.1)
Sodium: 136 mmol/L (ref 135–145)

## 2021-06-14 LAB — GLUCOSE, CAPILLARY
Glucose-Capillary: 107 mg/dL — ABNORMAL HIGH (ref 70–99)
Glucose-Capillary: 112 mg/dL — ABNORMAL HIGH (ref 70–99)
Glucose-Capillary: 114 mg/dL — ABNORMAL HIGH (ref 70–99)
Glucose-Capillary: 118 mg/dL — ABNORMAL HIGH (ref 70–99)
Glucose-Capillary: 126 mg/dL — ABNORMAL HIGH (ref 70–99)
Glucose-Capillary: 140 mg/dL — ABNORMAL HIGH (ref 70–99)

## 2021-06-14 MED ORDER — ENSURE ENLIVE PO LIQD
237.0000 mL | Freq: Three times a day (TID) | ORAL | Status: DC
  Administered 2021-06-14 – 2021-07-06 (×63): 237 mL via ORAL

## 2021-06-14 MED ORDER — LOSARTAN POTASSIUM 100 MG PO TABS: 100.0000 mg | ORAL_TABLET | Freq: Every day | ORAL | 3 refills | Status: DC

## 2021-06-14 MED ORDER — CHLORHEXIDINE GLUCONATE 0.12 % MT SOLN: 15.0000 mL | Freq: Two times a day (BID) | OROMUCOSAL | 0 refills | Status: DC

## 2021-06-14 MED ORDER — AMLODIPINE BESYLATE 2.5 MG PO TABS
2.5000 mg | ORAL_TABLET | Freq: Every day | ORAL | Status: DC
  Administered 2021-06-14: 2.5 mg via ORAL
  Filled 2021-06-14: qty 1

## 2021-06-14 MED ORDER — LOSARTAN POTASSIUM 50 MG PO TABS
100.0000 mg | ORAL_TABLET | Freq: Every day | ORAL | Status: DC
  Administered 2021-06-15: 100 mg via ORAL
  Filled 2021-06-14: qty 2

## 2021-06-14 MED ORDER — ACETAMINOPHEN 650 MG RE SUPP
650.0000 mg | RECTAL | Status: DC | PRN
Start: 2021-06-14 — End: 2021-07-07

## 2021-06-14 MED ORDER — AMLODIPINE BESYLATE 2.5 MG PO TABS
2.5000 mg | ORAL_TABLET | Freq: Every day | ORAL | Status: DC
Start: 2021-06-15 — End: 2021-07-07
  Administered 2021-06-15 – 2021-07-07 (×23): 2.5 mg via ORAL
  Filled 2021-06-14 (×24): qty 1

## 2021-06-14 MED ORDER — PANTOPRAZOLE SODIUM 40 MG PO TBEC: 40.0000 mg | DELAYED_RELEASE_TABLET | Freq: Every day | ORAL | 3 refills | Status: DC

## 2021-06-14 MED ORDER — CLOPIDOGREL BISULFATE 75 MG PO TABS
75.0000 mg | ORAL_TABLET | Freq: Every day | ORAL | Status: DC
  Administered 2021-06-15 – 2021-07-07 (×23): 75 mg via ORAL
  Filled 2021-06-14 (×24): qty 1

## 2021-06-14 MED ORDER — ACETAMINOPHEN 160 MG/5ML PO SOLN
650.0000 mg | ORAL | Status: DC | PRN
  Administered 2021-07-01: 650 mg
  Filled 2021-06-14: qty 20.3

## 2021-06-14 MED ORDER — ACETAMINOPHEN 325 MG PO TABS
650.0000 mg | ORAL_TABLET | ORAL | Status: DC | PRN
Start: 2021-06-14 — End: 2021-07-07
  Administered 2021-06-18 – 2021-07-04 (×4): 650 mg via ORAL
  Filled 2021-06-14 (×4): qty 2

## 2021-06-14 MED ORDER — INSULIN ASPART 100 UNIT/ML IJ SOLN: 0.0000 [IU] | INTRAMUSCULAR | 11 refills | Status: DC

## 2021-06-14 MED ORDER — ASPIRIN 81 MG PO CHEW
81.0000 mg | CHEWABLE_TABLET | Freq: Every day | ORAL | Status: DC
  Administered 2021-06-15 – 2021-07-07 (×23): 81 mg via ORAL
  Filled 2021-06-14 (×24): qty 1

## 2021-06-14 MED ORDER — ENOXAPARIN SODIUM 40 MG/0.4ML IJ SOSY
40.0000 mg | PREFILLED_SYRINGE | INTRAMUSCULAR | Status: DC
Start: 2021-06-14 — End: 2021-06-14

## 2021-06-14 MED ORDER — METOPROLOL TARTRATE 25 MG PO TABS: 12.5000 mg | ORAL_TABLET | Freq: Two times a day (BID) | ORAL | 3 refills | Status: DC

## 2021-06-14 MED ORDER — AMLODIPINE BESYLATE 2.5 MG PO TABS: 2.5000 mg | ORAL_TABLET | Freq: Every day | ORAL | 3 refills | Status: DC

## 2021-06-14 MED ORDER — EMPAGLIFLOZIN 10 MG PO TABS
10.0000 mg | ORAL_TABLET | Freq: Every day | ORAL | Status: DC
  Administered 2021-06-15 – 2021-07-07 (×23): 10 mg via ORAL
  Filled 2021-06-14 (×26): qty 1

## 2021-06-14 MED ORDER — HYDRALAZINE HCL 10 MG PO TABS: 10.0000 mg | ORAL_TABLET | Freq: Two times a day (BID) | ORAL | 3 refills | Status: DC

## 2021-06-14 MED ORDER — PANTOPRAZOLE SODIUM 40 MG PO TBEC
40.0000 mg | DELAYED_RELEASE_TABLET | Freq: Every day | ORAL | Status: DC
  Administered 2021-06-15 – 2021-06-17 (×3): 40 mg via ORAL
  Filled 2021-06-14 (×4): qty 1

## 2021-06-14 MED ORDER — ENSURE ENLIVE PO LIQD: 237.0000 mL | Freq: Three times a day (TID) | ORAL | 12 refills | Status: DC

## 2021-06-14 MED ORDER — ISOSORBIDE DINITRATE 10 MG PO TABS: 10.0000 mg | ORAL_TABLET | Freq: Two times a day (BID) | ORAL | 3 refills | Status: DC

## 2021-06-14 MED ORDER — NITROGLYCERIN 0.4 MG SL SUBL: 0.4000 mg | SUBLINGUAL_TABLET | SUBLINGUAL | Status: DC | PRN

## 2021-06-14 MED ORDER — METOPROLOL TARTRATE 12.5 MG HALF TABLET
12.5000 mg | ORAL_TABLET | Freq: Two times a day (BID) | ORAL | Status: DC
  Administered 2021-06-14 – 2021-07-07 (×46): 12.5 mg via ORAL
  Filled 2021-06-14 (×47): qty 1

## 2021-06-14 MED ORDER — ISOSORBIDE DINITRATE 10 MG PO TABS
10.0000 mg | ORAL_TABLET | Freq: Two times a day (BID) | ORAL | Status: DC
  Administered 2021-06-14 – 2021-07-07 (×46): 10 mg via ORAL
  Filled 2021-06-14 (×50): qty 1

## 2021-06-14 MED ORDER — ATORVASTATIN CALCIUM 80 MG PO TABS
80.0000 mg | ORAL_TABLET | Freq: Every day | ORAL | Status: DC
  Administered 2021-06-15 – 2021-07-07 (×23): 80 mg via ORAL
  Filled 2021-06-14 (×23): qty 1

## 2021-06-14 MED ORDER — ENOXAPARIN SODIUM 40 MG/0.4ML IJ SOSY
40.0000 mg | PREFILLED_SYRINGE | INTRAMUSCULAR | Status: DC
  Administered 2021-06-15 – 2021-07-06 (×22): 40 mg via SUBCUTANEOUS
  Filled 2021-06-14 (×20): qty 0.4

## 2021-06-14 MED ORDER — HYDRALAZINE HCL 10 MG PO TABS
10.0000 mg | ORAL_TABLET | Freq: Two times a day (BID) | ORAL | Status: DC
  Administered 2021-06-14 – 2021-06-26 (×24): 10 mg via ORAL
  Filled 2021-06-14 (×25): qty 1

## 2021-06-14 MED ORDER — METOPROLOL TARTRATE 12.5 MG HALF TABLET
12.5000 mg | ORAL_TABLET | Freq: Two times a day (BID) | ORAL | Status: DC
Start: 2021-06-14 — End: 2021-06-14
  Administered 2021-06-14: 12.5 mg via ORAL
  Filled 2021-06-14: qty 1

## 2021-06-14 MED ORDER — ACETAMINOPHEN 325 MG PO TABS: 650.0000 mg | ORAL_TABLET | ORAL | Status: DC | PRN

## 2021-06-14 MED ORDER — ASCORBIC ACID 500 MG PO TABS
500.0000 mg | ORAL_TABLET | Freq: Every day | ORAL | Status: DC
  Administered 2021-06-15 – 2021-07-07 (×23): 500 mg via ORAL
  Filled 2021-06-14 (×24): qty 1

## 2021-06-14 NOTE — Progress Notes (Signed)
Physical Therapy Treatment ?Patient Details ?Name: PRANIT OWENSBY ?MRN: [id number] ?DOB: 08/16/1970 ?Today's Date: 06/14/2021 ? ? ?History of Present Illness 51 yo male presented to ED on 4/25 with a chief complaint of weakness and confusion. Found to be bradycardic in 2nd degree heart block, with elevated trop, EKG suggested recent inferior wall MI vs STEMI.  During the cath he was noted to be agitated, aphasic and weak on the R side.  CT without acute findings, was concern for LVO, so was taken to IR where a temporary Pacer was placed. Intubated 4/25-4/26. CT revealed small old left pericallosal infarct, small old infarct in the anterior left frontal lobe, and old bilateral basal ganglia lacunar infarcts. Repeat CT of head 4/27 revealed moderately large area of infarct in the left posterior frontal lobe extending down to the frontal operculum and insula. PMH including CAD, HTN, stroke 2014, ischemic cm, hyperlipidemia, and tobacco abuse. ? ?  ?PT Comments  ? ? Pt is making excellent progress towards his physical therapy goals and remains motivated to participate. Session focused on static sitting and standing balance, transfer and pre gait training. Pt transferred from bed to chair and then performed subsequent four stands pulling up on left railing in hallway. Multimodal cueing for midline positioning, truncal/core activation, and right knee block provided. Pt continues with right hemiplegia, neglect, impaired vision, cognition, aphasia, and balance deficits. He is an excellent candidate for AIR based on age, motivation, PLOF, and family support. Recommend in order to maximize functional mobility and decrease caregiver burden. ?   ?Recommendations for follow up therapy are one component of a multi-disciplinary discharge planning process, led by the attending physician.  Recommendations may be updated based on patient status, additional functional criteria and insurance authorization. ? ?Follow Up Recommendations ?  Acute inpatient rehab (3hours/day) ?  ?  ?Assistance Recommended at Discharge Frequent or constant Supervision/Assistance  ?Patient can return home with the following Two people to help with walking and/or transfers;Two people to help with bathing/dressing/bathroom;Assistance with cooking/housework;Direct supervision/assist for medications management;Assistance with feeding;Direct supervision/assist for financial management;Assist for transportation;Help with stairs or ramp for entrance ?  ?Equipment Recommendations ? BSC/3in1;Wheelchair (measurements PT);Wheelchair cushion (measurements PT)  ?  ?Recommendations for Other Services   ? ? ?  ?Precautions / Restrictions Precautions ?Precautions: Fall;Other (comment) ?Precaution Comments: R hemiparesis, neglect, diplopia ?Restrictions ?Weight Bearing Restrictions: No  ?  ? ?Mobility ? Bed Mobility ?Overal bed mobility: Needs Assistance ?Bed Mobility: Supine to Sit ?  ?  ?Supine to sit: Mod assist, +2 for safety/equipment ?  ?  ?General bed mobility comments: Pt initiating well, exiting towards left side of bed, assist for RLE and trunk to midline. ?  ? ?Transfers ?Overall transfer level: Needs assistance ?Equipment used: None ?Transfers: Sit to/from Stand, Bed to chair/wheelchair/BSC ?Sit to Stand: Mod assist, +2 safety/equipment ?Stand pivot transfers: Mod assist, +2 physical assistance ?  ?  ?  ?  ?General transfer comment: ModA to rise with tactile facilitation for R quad activation, pivoting towards left to chair with R knee block. Pt completing x 4 sit to stands pulling up on L rail in hallway. Max multimodal cues for midline, weight shifting, upright posture, cervical extension with right knee block provided consistently. Pt with moderate right knee buckle, but accepting some weight in standing position ?  ? ?Ambulation/Gait ?  ?  ?  ?  ?  ?  ?  ?General Gait Details: unable currently ? ? ?Stairs ?  ?  ?  ?  ?  ? ? ?  Wheelchair Mobility ?  ? ?Modified Rankin  (Stroke Patients Only) ?Modified Rankin (Stroke Patients Only) ?Pre-Morbid Rankin Score: No symptoms ?Modified Rankin: Severe disability ? ? ?  ?Balance Overall balance assessment: Needs assistance ?Sitting-balance support: No upper extremity supported, Feet supported ?Sitting balance-Leahy Scale: Poor ?Sitting balance - Comments: Pt requiring min guard-modA, tendency for right lateral lean. Able to correct with multimodal cueing but worsens with fatigue ?  ?Standing balance support: Single extremity supported ?Standing balance-Leahy Scale: Poor ?Standing balance comment: Min-modA +2 for static standing balance ?  ?  ?  ?  ?  ?  ?  ?  ?  ?  ?  ?  ? ?  ?Cognition Arousal/Alertness: Awake/alert ?Behavior During Therapy: Flat affect ?Overall Cognitive Status: Difficult to assess ?  ?  ?  ?  ?  ?  ?  ?  ?  ?  ?  ?  ?  ?  ?  ?  ?General Comments: Pt nonverbal, will nod yes to questions, follows left sided commands. appears very motivated ?  ?  ? ?  ?Exercises   ? ?  ?General Comments   ?  ?  ? ?Pertinent Vitals/Pain Pain Assessment ?Pain Assessment: Faces ?Faces Pain Scale: No hurt  ? ? ?Home Living   ?  ?  ?  ?  ?  ?  ?  ?  ?  ?   ?  ?Prior Function    ?  ?  ?   ? ?PT Goals (current goals can now be found in the care plan section) Acute Rehab PT Goals ?Patient Stated Goal: unable to state ?Potential to Achieve Goals: Good ?Progress towards PT goals: Progressing toward goals ? ?  ?Frequency ? ? ? Min 4X/week ? ? ? ?  ?PT Plan Current plan remains appropriate  ? ? ?Co-evaluation   ?  ?  ?  ?  ? ?  ?AM-PAC PT "6 Clicks" Mobility   ?Outcome Measure ? Help needed turning from your back to your side while in a flat bed without using bedrails?: A Lot ?Help needed moving from lying on your back to sitting on the side of a flat bed without using bedrails?: A Lot ?Help needed moving to and from a bed to a chair (including a wheelchair)?: A Lot ?Help needed standing up from a chair using your arms (e.g., wheelchair or bedside  chair)?: A Lot ?Help needed to walk in hospital room?: Total ?Help needed climbing 3-5 steps with a railing? : Total ?6 Click Score: 10 ? ?  ?End of Session Equipment Utilized During Treatment: Gait belt ?Activity Tolerance: Patient tolerated treatment well ?Patient left: with call bell/phone within reach;in chair;with chair alarm set ?Nurse Communication: Mobility status ?PT Visit Diagnosis: Muscle weakness (generalized) (M62.81);Difficulty in walking, not elsewhere classified (R26.2);Apraxia (R48.2);Other symptoms and signs involving the nervous system (R29.898) ?  ? ? ?Time: 0825-0902 ?PT Time Calculation (min) (ACUTE ONLY): 37 min ? ?Charges:  $Therapeutic Activity: 8-22 mins ?$Neuromuscular Re-education: 8-22 mins          ?          ? ?Lillia Pauls, PT, DPT ?Acute Rehabilitation Services ?Pager [phone] ?Office [phone] ? ? ? ?Norval Morton ?06/14/2021, 11:49 AM ? ?

## 2021-06-14 NOTE — Progress Notes (Signed)
Ref: Orpah Cobb, MD ? ? ?Subjective:  ?Tolerating solid food and thick liquids. ?BP on low side. HR 80-90's/min. ?Sitting up.  ? ?Objective:  ?Vital Signs in the last 24 hours: ?Temp:  [98.3 ?F (36.8 ?C)-98.9 ?F (37.2 ?C)] 98.6 ?F (37 ?C) (05/02 0700) ?Pulse Rate:  [79-87] 80 (05/02 0411) ?Cardiac Rhythm: Normal sinus rhythm;Heart block (05/01 2045) ?Resp:  [14-20] 15 (05/02 0700) ?BP: (99-123)/(68-85) 123/83 (05/02 0700) ?SpO2:  [98 %-100 %] 98 % (05/02 0411) ?Weight:  [83.7 kg] 83.7 kg (05/02 0426) ? ?Physical Exam: ?BP Readings from Last 1 Encounters:  ?06/14/21 123/83  ?   ?Wt Readings from Last 1 Encounters:  ?06/14/21 83.7 kg  ?  Weight change: -3.9 kg Body mass index is 23.69 kg/m?. ?HEENT: [place]/AT, Eyes-Brown, Conjunctiva-Pink, Sclera-Non-icteric ?Neck: No JVD, No bruit, Trachea midline. ?Lungs:  Clear, Bilateral. ?Cardiac:  Regular rhythm, normal S1 and S2, no S3. II/VI systolic murmur. ?Abdomen:  Soft, non-tender. BS present. ?Extremities:  No edema present. No cyanosis. No clubbing. ?CNS: AxOx2, Aphasia and right sided hemiplegia.  ?Skin: Warm and dry. ? ? ?Intake/Output from previous day: ?05/01 0701 - 05/02 0700 ?In: 245 [P.O.:240; I.V.:5] ?Out: 1450 [Urine:1450] ? ? ? ?Lab Results: ?BMET ?   ?Component Value Date/Time  ? NA 136 06/14/2021 0107  ? NA 136 06/13/2021 0028  ? NA 139 06/11/2021 0107  ? K 4.3 06/14/2021 0107  ? K 4.2 06/13/2021 0028  ? K 4.1 06/11/2021 0107  ? CL 100 06/14/2021 0107  ? CL 100 06/13/2021 0028  ? CL 106 06/11/2021 0107  ? CO2 24 06/14/2021 0107  ? CO2 26 06/13/2021 0028  ? CO2 24 06/11/2021 0107  ? GLUCOSE 116 (H) 06/14/2021 0107  ? GLUCOSE 127 (H) 06/13/2021 0028  ? GLUCOSE 126 (H) 06/11/2021 0107  ? BUN 28 (H) 06/14/2021 0107  ? BUN 20 06/13/2021 0028  ? BUN 21 (H) 06/11/2021 0107  ? CREATININE 1.29 (H) 06/14/2021 0107  ? CREATININE 1.28 (H) 06/13/2021 0028  ? CREATININE 1.32 (H) 06/11/2021 0107  ? CALCIUM 9.6 06/14/2021 0107  ? CALCIUM 9.2 06/13/2021 0028  ? CALCIUM 9.3  06/11/2021 0107  ? GFRNONAA >60 06/14/2021 0107  ? GFRNONAA >60 06/13/2021 0028  ? GFRNONAA >60 06/11/2021 0107  ? GFRAA 79 (L) 10/25/2012 9528  ? GFRAA 86 (L) 10/24/2012 1954  ? GFRAA 90 (L) 10/24/2012 0957  ? ?CBC ?   ?Component Value Date/Time  ? WBC 6.3 06/14/2021 0107  ? RBC 4.64 06/14/2021 0107  ? HGB 13.0 06/14/2021 0107  ? HCT 39.9 06/14/2021 0107  ? PLT 259 06/14/2021 0107  ? MCV 86.0 06/14/2021 0107  ? MCH 28.0 06/14/2021 0107  ? MCHC 32.6 06/14/2021 0107  ? RDW 13.8 06/14/2021 0107  ? LYMPHSABS 0.7 06/08/2021 0501  ? MONOABS 0.9 06/08/2021 0501  ? EOSABS 0.0 06/08/2021 0501  ? BASOSABS 0.0 06/08/2021 0501  ? ?HEPATIC Function Panel ?Recent Labs  ?  11/29/20 ?1258 06/07/21 ?0903  ?PROT 7.7 6.8  ? ?HEMOGLOBIN A1C ?No components found for: HGA1C,  MPG ?CARDIAC ENZYMES ?Lab Results  ?Component Value Date  ? CKTOTAL 319 (H) 09/18/2010  ? CKMB 2.0 09/18/2010  ? TROPONINI <0.30 10/24/2012  ? TROPONINI <0.30 09/15/2012  ? TROPONINI <0.30 09/18/2010  ? ?BNP ?No results for input(s): PROBNP in the last 8760 hours. ?TSH ?Recent Labs  ?  11/30/20 ?0305  ?TSH 1.016  ? ?CHOLESTEROL ?Recent Labs  ?  11/30/20 ?0305 06/08/21 ?0501  ?CHOL 157 173  ? ? ?  Scheduled Meds: ? amLODipine  2.5 mg Oral Daily  ? vitamin C  500 mg Oral Daily  ? aspirin  81 mg Oral Daily  ? atorvastatin  80 mg Oral Daily  ? chlorhexidine  15 mL Mouth Rinse BID  ? clopidogrel  75 mg Oral Daily  ? empagliflozin  10 mg Oral Daily  ? enoxaparin (LOVENOX) injection  40 mg Subcutaneous Q24H  ? feeding supplement  237 mL Oral TID BM  ? hydrALAZINE  10 mg Oral BID  ? insulin aspart  0-15 Units Subcutaneous Q4H  ? isosorbide dinitrate  10 mg Oral BID  ? losartan  100 mg Oral Daily  ? mouth rinse  15 mL Mouth Rinse BID  ? metoprolol tartrate  12.5 mg Oral BID  ? pantoprazole  40 mg Oral Daily  ? sodium chloride flush  3 mL Intravenous Q12H  ? ?Continuous Infusions: ? sodium chloride    ? sodium chloride    ? ?PRN Meds:.sodium chloride, sodium chloride,  acetaminophen **OR** acetaminophen (TYLENOL) oral liquid 160 mg/5 mL **OR** acetaminophen, nitroGLYCERIN, ondansetron (ZOFRAN) IV, sodium chloride flush ? ?Assessment/Plan: ? NSTEMI ?Stroke with right sided hemiplegia ?Aphasia ?Vision disturbance ?CAD ?S/P LAD stent ?HTN ?HLD ?HCM ?PVD ? ?Plan: ?Decrease amlodipine 50 % to 2.5 mg. Daily. ?Discontinue HCTZ. ?Add small dose metoprolol 12.5 mg. One twice daily. ?Increase activity as tolerated. ?Awaiting IP rehab. ? ? LOS: 7 days  ? ?Time spent including chart review, lab review, examination, discussion with patient/Nurse : 30 min ? ? ?Orpah Cobb  MD  ?06/14/2021, 9:22 AM ? ? ? ? ?

## 2021-06-14 NOTE — Progress Notes (Signed)
Inpatient Rehabilitation Admissions Coordinator  ? ?I await insurance approval for possible Cir admit. I contacted his wife by phone and we will meet at 1230 today to review estimated cost of care if CIR approved. ? ?Ottie Glazier, RN, MSN ?Rehab Admissions Coordinator ?[phone] ?06/14/2021 11:27 AM ? ?
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
## 2021-06-14 NOTE — Progress Notes (Signed)
Inpatient Rehabilitation Admission Medication Review by a Pharmacist ? ?A complete drug regimen review was completed for this patient to identify any potential clinically significant medication issues. ? ?High Risk Drug Classes Is patient taking? Indication by Medication  ?Antipsychotic No   ?Anticoagulant Yes Lovenox-VTE ppx  ?Antibiotic    ?Opioid No   ?Antiplatelet Yes Aspirin/clopidrogel-CAD/LAD stent/non-STEMI  ?Hypoglycemics/insulin Yes Jardiance-Hypertrophic cardiomyopathy  ?Vasoactive Medication Yes Norvasc/hydralazine/Isordil/Lopressor/Cozaar-HTN  ?Chemotherapy No   ?Other Yes Lipitor-HLD ?Prootonix-GERD ?Tylenol-pain ?NTG-prn chest pain ?Vitamin C-supplement  ? ? ? ?Type of Medication Issue Identified Description of Issue Recommendation(s)  ?Drug Interaction(s) (clinically significant) ?    ?Duplicate Therapy ?    ?Allergy ?    ?No Medication Administration End Date ?    ?Incorrect Dose ?    ?Additional Drug Therapy Needed ?    ?Significant med changes from prior encounter (inform family/care partners about these prior to discharge). Amlodipine decreased to 2.5 mg. Daily. ?HCTZ discontinued. ?Added metoprolol 12.5 mg twice daily.   ?Other ?    ? ? ?Clinically significant medication issues were identified that warrant physician communication and completion of prescribed/recommended actions by midnight of the next day:  No ? ? ?Time spent performing this drug regimen review (minutes):  10 ? ? ?Legrand Pitts ?06/14/2021 7:17 PM ?

## 2021-06-14 NOTE — Progress Notes (Signed)
Inpatient Rehabilitation Admissions Coordinator   ? ? I have insurance approval and Cir bed to admit him to today. I contacted Dr Doylene Canard for clearance to admit today. I spoke with pt's wife and she is in agreement. Acute team and TOC made aware. I will make the arrangements to admit today. ? ?Danne Baxter, RN, MSN ?Rehab Admissions Coordinator ?[phone] ?06/14/2021 3:43 PM ? ?
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
## 2021-06-14 NOTE — Progress Notes (Signed)
INPATIENT REHABILITATION ADMISSION NOTE ? ? ?Arrival Method: Bed ? ?   ?Mental Orientation: Alert. Unable to assess orientation due to aphasia. Nods "yes" to all questions.  ? ? ?Assessment: done ? ? ?Skin: done ? ? ?IV'S: R. AC ? ? ?Pain: none  ? ? ?Tubes and Drains: Male purewick in place ? ? ?Safety Measures: reviewed with family ? ? ?Vital Signs: done ? ? ?Height and Weight: done ? ? ?Rehab Orientation: done  ? ? ?Family: at bedside ? ? ? ?Notes: Done. ? ?Marylu Lund, RN ?  ?
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
## 2021-06-14 NOTE — Discharge Summary (Signed)
Physician Discharge Summary  ?Patient ID: ?Henry Simmons ?MRN: [id number] ?DOB/AGE: March 11, 1970 51 y.o. ? ?Admit date: 06/07/2021 ?Discharge date: 06/14/2021 ? ?Admission Diagnoses: ?NSTEMI ?2nd to 3rd degree AV block ?CAD ?SP LAD stent ?HCM ?HTN ?HLD ? ?Discharge Diagnoses:  ?Principal Problem: ?  NSTEMI (non-ST elevated myocardial infarction) (HCC) ?Active Problems: ?  Middle cerebral artery syndrome ?  Heart block AV third degree ?  Ventilator dependence (HCC) ?  Cerebral embolism with cerebral infarction ?  Right sided hemiplegia ?  Aphasia ?  Visual disturbance ?  CAD ?  Patent LAD stent ?  HTN ?  HLD ? ?Discharged Condition: fair ? ?Hospital Course: 51 years old black male with PMH of HCM, HTN, CAD, LAD stenting, Old Stroke had weakness, confusion, 2nd to 3rd degree AV block and elevated cardiac troponin Is. EKG was suggestive of recent inferior wall MI. ?He underwent urgent cardiac catheterization and his 36 month old LAD stent was patent. Prior to completion of RCA angiography patient had stroke with right sided hemiplegia and aphasia.  ?Code stroke ws called but patient had right sided weakness prior to arrival to the hospital hence TPA/TNK was not given. He underwent urgent cerebral angiography which showed diffuse lesions and as such angioplasty was not done. He had emergent transvenous pacemaker placed for his 3rd degree heart block with improved heart rate and blood pressure. ?He was also intubated prior to cerebral angiography and extubated in less than 48 hours. His temporary pacemaker on 4th day when he converted to normal sinus sinus rhythm without betablocker use. ?He was considered for inpatient rehab and as such will be discharged and readmiited to rehab service. ? ?Consults: cardiology, pulmonary/intensive care, and neurology ? ?Significant Diagnostic Studies:  ?labs: Near normal CBC, electrolytes and improving LFTs compared to 6 months ago. Creatinine was 1.51 mg. ?Troponin I was trending down to  12,571 ng.  ?Lipid panel showed 173 mg. Total, 21 mg. HDL and 118 mg. LDL cholesterol. Triglyceride was mildly elevated at 171 mg/dL. ?Hgb A1C was normal at 5.3 % ? ?EKG: 3rd degree AV block with recent or old inferior wall MI. ? ?CXR: No active disease. Repeat CXR: Pacer wire in IVC  ?and RV. ? ?X-ray abdomen: : enteric tube in distal stomach or proximal duodenum. ? ?CT head x 2: bilateral basal ganglia lacunar infarcts and chronic microvascular ischemic changes and old left pericallosal infarct and ASPECTS score of 10. ? ?IR angi vertebral artery: Severe intracranial arteriosclerotic changes. Severe stenosis of proximal basilar artery. ?Unsuccessful attempts to open severely diseased M3 region of left MCA. ? ?MRI and MRA brain : Large left MCA territory infarct and small right parietal infarct. ? ?Treatments: cardiac meds: metoprolol and anticoagulation: ASA and Plavix, and respiratory therapy: O2 and Ventilator support for short period. ?Inpatient rehab recommended and approved. ? ?Discharge Exam: ?Blood pressure 99/73, pulse 86, temperature 97.8 ?F (36.6 ?C), temperature source Oral, resp. rate 20, height 6\' 2"  (1.88 m), weight 83.7 kg, SpO2 97 %. ?General appearance: alert, cooperative and appears stated age. ?Head: Normocephalic, atraumatic. ?Eyes: Brown eyes, pink conjunctiva, corneas clear. Double vision.  ?Neck: No adenopathy, no carotid bruit, no JVD, supple, symmetrical, trachea midline and thyroid not enlarged. ?Resp: Clear to auscultation bilaterally. ?Cardio: Regular rate and rhythm, S1, S2 normal, II/VI systolic murmur, no click, rub or gallop. ?GI: Soft, non-tender; bowel sounds normal; no organomegaly. ?Extremities: No edema, cyanosis or clubbing. ?Skin: Warm and dry.  ?Neurologic: Alert and oriented X 2, Aphasia and right sided  hemiplegia continues. ? ?Disposition: Discharge disposition: 02-Transferred to Short Term Hospital/IP rehab. ? ? ? ? ? ? ? ?Allergies as of 06/14/2021   ?No Known Allergies ?   ? ?  ?Medication List  ?  ? ?STOP taking these medications   ? ?Brilinta 90 MG Tabs tablet ?Generic drug: ticagrelor ?  ?carvedilol 25 MG tablet ?Commonly known as: COREG ?  ?co-enzyme Q-10 30 MG capsule ?  ?Fish Oil 1000 MG Caps ?  ?IRON PO ?  ?meclizine 25 MG tablet ?Commonly known as: ANTIVERT ?  ?nicotine 14 mg/24hr patch ?Commonly known as: NICODERM CQ - dosed in mg/24 hours ?  ?Turmeric 500 MG Caps ?  ?Vitamin D3 10 MCG (400 UNIT) tablet ?  ? ?  ? ?TAKE these medications   ? ?acetaminophen 325 MG tablet ?Commonly known as: TYLENOL ?Take 2 tablets (650 mg total) by mouth every 4 (four) hours as needed for mild pain (or temp > 37.5 C (99.5 F)). ?  ?amLODipine 2.5 MG tablet ?Commonly known as: NORVASC ?Take 1 tablet (2.5 mg total) by mouth daily. ?Start taking on: Jun 15, 2021 ?What changed:  ?medication strength ?how much to take ?  ?Aspirin Low Dose 81 MG EC tablet ?Generic drug: aspirin ?Take 1 tablet (81 mg total) by mouth daily. Swallow whole. ?  ?atorvastatin 80 MG tablet ?Commonly known as: LIPITOR ?Take 1 tablet (80 mg total) by mouth daily. ?  ?chlorhexidine 0.12 % solution ?Commonly known as: PERIDEX ?15 mLs by Mouth Rinse route 2 (two) times daily. ?  ?clopidogrel 75 MG tablet ?Commonly known as: PLAVIX ?Take 75 mg by mouth daily. ?  ?feeding supplement Liqd ?Take 237 mLs by mouth 3 (three) times daily between meals. ?  ?hydrALAZINE 10 MG tablet ?Commonly known as: APRESOLINE ?Take 1 tablet (10 mg total) by mouth 2 (two) times daily. ?  ?insulin aspart 100 UNIT/ML injection ?Commonly known as: novoLOG ?Inject 0-15 Units into the skin every 4 (four) hours. ?  ?isosorbide dinitrate 10 MG tablet ?Commonly known as: ISORDIL ?Take 1 tablet (10 mg total) by mouth 2 (two) times daily. ?  ?Jardiance 10 MG Tabs tablet ?Generic drug: empagliflozin ?Take 1 tablet (10 mg total) by mouth daily. ?  ?losartan 100 MG tablet ?Commonly known as: COZAAR ?Take 1 tablet (100 mg total) by mouth daily. ?Start taking on: Jun 15, 2021 ?What changed:  ?medication strength ?how much to take ?  ?metoprolol tartrate 25 MG tablet ?Commonly known as: LOPRESSOR ?Take 0.5 tablets (12.5 mg total) by mouth 2 (two) times daily. ?  ?nitroGLYCERIN 0.4 MG SL tablet ?Commonly known as: NITROSTAT ?Place 1 tablet (0.4 mg total) under the tongue every 5 (five) minutes as needed for chest pain. ?  ?pantoprazole 40 MG tablet ?Commonly known as: PROTONIX ?Take 1 tablet (40 mg total) by mouth daily. ?Start taking on: Jun 15, 2021 ?  ?vitamin C 500 MG tablet ?Commonly known as: ASCORBIC ACID ?Take 500 mg by mouth daily. ?  ? ?  ? ? Follow-up Information   ? ? Orpah Cobb, MD Follow up in 1 month(s).   ?Specialty: Cardiology ?Contact information: ?[address] ?[address] ?[name] [address] ?[phone] ? ? ?  ?  ? ?  ?  ? ?  ? ? ?Time spent: Review of old chart, current chart, lab, x-ray, cardiac tests and discussion with patient over 60 minutes. ? ?Signed: ?Ricki Rodriguez ?06/14/2021, 3:51 PM ? ? ?

## 2021-06-15 DIAGNOSIS — I63512 Cerebral infarction due to unspecified occlusion or stenosis of left middle cerebral artery: Secondary | ICD-10-CM | POA: Diagnosis not present

## 2021-06-15 LAB — CBC WITH DIFFERENTIAL/PLATELET
Abs Immature Granulocytes: 0.07 10*3/uL (ref 0.00–0.07)
Basophils Absolute: 0 10*3/uL (ref 0.0–0.1)
Basophils Relative: 0 %
Eosinophils Absolute: 0.2 10*3/uL (ref 0.0–0.5)
Eosinophils Relative: 2 %
HCT: 38.5 % — ABNORMAL LOW (ref 39.0–52.0)
Hemoglobin: 12.5 g/dL — ABNORMAL LOW (ref 13.0–17.0)
Immature Granulocytes: 1 %
Lymphocytes Relative: 21 %
Lymphs Abs: 1.4 10*3/uL (ref 0.7–4.0)
MCH: 28 pg (ref 26.0–34.0)
MCHC: 32.5 g/dL (ref 30.0–36.0)
MCV: 86.3 fL (ref 80.0–100.0)
Monocytes Absolute: 0.8 10*3/uL (ref 0.1–1.0)
Monocytes Relative: 12 %
Neutro Abs: 4.2 10*3/uL (ref 1.7–7.7)
Neutrophils Relative %: 64 %
Platelets: 280 10*3/uL (ref 150–400)
RBC: 4.46 MIL/uL (ref 4.22–5.81)
RDW: 13.9 % (ref 11.5–15.5)
WBC: 6.7 10*3/uL (ref 4.0–10.5)
nRBC: 0 % (ref 0.0–0.2)

## 2021-06-15 LAB — COMPREHENSIVE METABOLIC PANEL
ALT: 251 U/L — ABNORMAL HIGH (ref 0–44)
AST: 149 U/L — ABNORMAL HIGH (ref 15–41)
Albumin: 3.3 g/dL — ABNORMAL LOW (ref 3.5–5.0)
Alkaline Phosphatase: 57 U/L (ref 38–126)
Anion gap: 9 (ref 5–15)
BUN: 35 mg/dL — ABNORMAL HIGH (ref 6–20)
CO2: 25 mmol/L (ref 22–32)
Calcium: 9.6 mg/dL (ref 8.9–10.3)
Chloride: 104 mmol/L (ref 98–111)
Creatinine, Ser: 1.37 mg/dL — ABNORMAL HIGH (ref 0.61–1.24)
GFR, Estimated: >60 mL/min (ref >60)
Glucose, Bld: 115 mg/dL — ABNORMAL HIGH (ref 70–99)
Potassium: 4.4 mmol/L (ref 3.5–5.1)
Sodium: 138 mmol/L (ref 135–145)
Total Bilirubin: 0.6 mg/dL (ref 0.3–1.2)
Total Protein: 6.9 g/dL (ref 6.5–8.1)

## 2021-06-15 LAB — GLUCOSE, CAPILLARY
Glucose-Capillary: 121 mg/dL — ABNORMAL HIGH (ref 70–99)
Glucose-Capillary: 110 mg/dL — ABNORMAL HIGH (ref 70–99)

## 2021-06-15 MED ORDER — LOSARTAN POTASSIUM 50 MG PO TABS
50.0000 mg | ORAL_TABLET | Freq: Every day | ORAL | Status: DC
  Administered 2021-06-16 – 2021-06-17 (×2): 50 mg via ORAL
  Filled 2021-06-15 (×2): qty 1

## 2021-06-15 NOTE — Progress Notes (Signed)
?                                                       PROGRESS NOTE ? ? ?Subjective/Complaints: ? ?Aphasic, but alert , follows simple commands such as close eyes, raise Left arm or Left leg  ? ?ROS- aphasic  ? ?Objective: ?  ?No results found. ?Recent Labs  ?  06/14/21 ?1857 06/15/21 ?[id number]  ?WBC 6.5 6.7  ?HGB 12.7* 12.5*  ?HCT 38.4* 38.5*  ?PLT 281 280  ? ?Recent Labs  ?  06/14/21 ?0107 06/14/21 ?1857 06/15/21 ?[id number]  ?NA 136  --  138  ?K 4.3  --  4.4  ?CL 100  --  104  ?CO2 24  --  25  ?GLUCOSE 116*  --  115*  ?BUN 28*  --  35*  ?CREATININE 1.29* 1.39* 1.37*  ?CALCIUM 9.6  --  9.6  ? ? ?Intake/Output Summary (Last 24 hours) at 06/15/2021 0917 ?Last data filed at 06/15/2021 0818 ?Gross per 24 hour  ?Intake 120 ml  ?Output --  ?Net 120 ml  ?  ? ?  ? ?Physical Exam: ?Vital Signs ?Blood pressure 107/81, pulse 81, temperature 99 ?F (37.2 ?C), temperature source Oral, resp. rate 16, height 6\' 2"  (1.88 m), weight 82.1 kg, SpO2 100 %. ? ? ?General: No acute distress ?Mood and affect are appropriate ?Heart: Regular rate and rhythm no rubs murmurs or extra sounds ?Lungs: Clear to auscultation, breathing unlabored, no rales or wheezes ?Abdomen: Positive bowel sounds, soft nontender to palpation, nondistended ?Extremities: No clubbing, cyanosis, or edema ?Skin: No evidence of breakdown, no evidence of rash ?Neurologic: Cranial nerves II through XII intact, motor strength is 5/5 in left and 0/5 right deltoid, bicep, tricep, grip, hip flexor, knee extensors, ankle dorsiflexor and plantar flexor ?Sensory exam cannot assess due to aphasia  ? ?Musculoskeletal: No pain with RUE or LE PROM or LUE, LLE AROM  ? ? ?Assessment/Plan: ?1. Functional deficits which require 3+ hours per day of interdisciplinary therapy in a comprehensive inpatient rehab setting. ?Physiatrist is providing close team supervision and 24 hour management of active medical problems listed below. ?Physiatrist and rehab team continue to assess barriers to  discharge/monitor patient progress toward functional and medical goals ? ?Care Tool: ? ?Bathing ?   ?   ?   ?  ?  ?Bathing assist   ?  ?  ?Upper Body Dressing/Undressing ?Upper body dressing   ?  ?   ?Upper body assist   ?   ?Lower Body Dressing/Undressing ?Lower body dressing ? ? ?   ?  ? ?  ? ?Lower body assist   ?   ? ?Toileting ?Toileting    ?Toileting assist   ?  ?  ?Transfers ?Chair/bed transfer ? ?Transfers assist ?   ? ?  ?  ?  ?Locomotion ?Ambulation ? ? ?Ambulation assist ? ?   ? ?  ?  ?   ? ?Walk 10 feet activity ? ? ?Assist ?   ? ?  ?   ? ?Walk 50 feet activity ? ? ?Assist   ? ?  ?   ? ? ?Walk 150 feet activity ? ? ?Assist   ? ?  ?  ?  ? ?Walk 10 feet on uneven surface  ?activity ? ? ?Assist   ? ? ?  ?   ? ?  Wheelchair ? ? ? ? ?Assist   ?  ?  ? ?  ?   ? ? ?Wheelchair 50 feet with 2 turns activity ? ? ? ?Assist ? ?  ?  ? ? ?   ? ?Wheelchair 150 feet activity  ? ? ? ?Assist ?   ? ? ?   ? ?Blood pressure 107/81, pulse 81, temperature 99 ?F (37.2 ?C), temperature source Oral, resp. rate 16, height 6\' 2"  (1.88 m), weight 82.1 kg, SpO2 100 %. ? ?Medical Problem List and Plan: ?1. 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            -patient may  shower ?            -ELOS/Goals: 14-20 days min A for SLP,PT and OT ?Team conf today  ?2.  Antithrombotics: ?-DVT/anticoagulation:  Pharmaceutical: Lovenox ?            -antiplatelet therapy: Aspirin 81 mg daily and Plavix 75 mg daily ?3. Pain Management: Tylenol as needed ?4. Mood: Provide emotional support ?            -antipsychotic agents: N/A ?5. Neuropsych: This patient is not capable of making decisions on his own behalf. ?6. Skin/Wound Care: Routine skin checks ?7. Fluids/Electrolytes/Nutrition: Routine in and outs with follow-up chemistries ?8.  CAD/LAD stent/non-STEMI.  Continue aspirin and Plavix.  Follow-up per cardiology services ?9.   Hypertrophic cardiomyopathy/bradycardia.  Initially with temporary pacer.  No current plan for permanent pacemaker.  Continue Jardiance 10 mg daily. ?10.  Hypertension.  Norvasc 2.5 mg daily, hydralazine 10 mg twice daily, Isordil 10 mg twice daily,Lopressor 12.5 mg BID, Cozaar 100 mg daily. Monitor with increased mobility ?Vitals:  ? 06/14/21 2054 06/15/21 0519  ?BP: 107/74 107/81  ?Pulse: 73 81  ?Resp: 18 16  ?Temp: 98.6 ?F (37 ?C) 99 ?F (37.2 ?C)  ?SpO2: 100% 100%  ?Given intracranial stenosis would allow BP to run upper normal 130-140s, will decrease Cozaar  ? ?11.  CKD stage III.  Follow-up chemistries- ? ?  Latest Ref Rng & Units 06/15/2021  ?  5:23 AM 06/14/2021  ?  6:57 PM 06/14/2021  ?  1:07 AM  ?BMP  ?Glucose 70 - 99 mg/dL 115    116    ?BUN 6 - 20 mg/dL 35    28    ?Creatinine 0.61 - 1.24 mg/dL 1.37   1.39   1.29    ?Sodium 135 - 145 mmol/L 138    136    ?Potassium 3.5 - 5.1 mmol/L 4.4    4.3    ?Chloride 98 - 111 mmol/L 104    100    ?CO2 22 - 32 mmol/L 25    24    ?Calcium 8.9 - 10.3 mg/dL 9.6    9.6    ?BUN increasing will enc po fluids, check otho vitals ? ?12.  Hyperlipidemia.  Lipitor ?  ? ?LOS: ?1 days ?A FACE TO FACE EVALUATION WAS PERFORMED ? ?Henry Simmons ?06/15/2021, 9:17 AM  ? ? ? ?

## 2021-06-15 NOTE — Progress Notes (Signed)
Inpatient Rehabilitation  Patient information reviewed and entered into eRehab system by Uzoma Vivona M. Gracen Ringwald, M.A., CCC/SLP, PPS Coordinator.  Information including medical coding, functional ability and quality indicators will be reviewed and updated through discharge.    

## 2021-06-15 NOTE — Progress Notes (Signed)
Inpatient Rehabilitation Center ?Individual Statement of Services ? ?Patient Name:  Henry Simmons  ?Date:  06/15/2021 ? ?Welcome to the [facility].  Our goal is to provide you with an individualized program based on your diagnosis and situation, designed to meet your specific needs.  With this comprehensive rehabilitation program, you will be expected to participate in at least 3 hours of rehabilitation therapies Monday-Friday, with modified therapy programming on the weekends. ? ?Your rehabilitation program will include the following services:  Physical Therapy (PT), Occupational Therapy (OT), Speech Therapy (ST), 24 hour per day rehabilitation nursing, Therapeutic Recreaction (TR), Neuropsychology, Care Coordinator, Rehabilitation Medicine, Nutrition Services, Pharmacy Services, and Other ? ?Weekly team conferences will be held on Wednesdays to discuss your progress.  Your Inpatient Rehabilitation Care Coordinator will talk with you frequently to get your input and to update you on team discussions.  Team conferences with you and your family in attendance may also be held. ? ?Expected length of stay: 14-20 Days  Overall anticipated outcome:  Min A ? ?Depending on your progress and recovery, your program may change. Your Inpatient Rehabilitation Care Coordinator will coordinate services and will keep you informed of any changes. Your Inpatient Rehabilitation Care Coordinator's name and contact numbers are listed  below. ? ?The following services may also be recommended but are not provided by the [facility]:  ? ?Home Health Rehabiltiation Services ?Outpatient Rehabilitation Services ? ?  ?Arrangements will be made to provide these services after discharge if needed.  Arrangements include referral to agencies that provide these services. ? ?Your insurance has been verified to be:   [employer]  ?Your primary doctor is:  Dixie Dials, MD ? ?Pertinent information will be shared with  your doctor and your insurance company. ? ?Inpatient Rehabilitation Care Coordinator:  Erlene Quan, [place] or ([phone] ? ?Information discussed with and copy given to patient by: Dyanne Iha, 06/15/2021, 11:17 AM    ?

## 2021-06-15 NOTE — Plan of Care (Signed)
?  Problem: RH Balance ?Goal: LTG Patient will maintain dynamic sitting balance (PT) ?Description: LTG:  Patient will maintain dynamic sitting balance with assistance during mobility activities (PT) ?Flowsheets (Taken 06/15/2021 2310) ?LTG: Pt will maintain dynamic sitting balance during mobility activities with:: Supervision/Verbal cueing ?Goal: LTG Patient will maintain dynamic standing balance (PT) ?Description: LTG:  Patient will maintain dynamic standing balance with assistance during mobility activities (PT) ?Flowsheets (Taken 06/15/2021 2310) ?LTG: Pt will maintain dynamic standing balance during mobility activities with:: Moderate Assistance - Patient 50 - 74% ?  ?Problem: RH Bed Mobility ?Goal: LTG Patient will perform bed mobility with assist (PT) ?Description: LTG: Patient will perform bed mobility with assistance, with/without cues (PT). ?Flowsheets (Taken 06/15/2021 2310) ?LTG: Pt will perform bed mobility with assistance level of: Supervision/Verbal cueing ?  ?Problem: RH Bed to Chair Transfers ?Goal: LTG Patient will perform bed/chair transfers w/assist (PT) ?Description: LTG: Patient will perform bed to chair transfers with assistance (PT). ?Flowsheets (Taken 06/15/2021 2310) ?LTG: Pt will perform Bed to Chair Transfers with assistance level: Minimal Assistance - Patient > 75% ?  ?Problem: RH Ambulation ?Goal: LTG Patient will ambulate in controlled environment (PT) ?Description: LTG: Patient will ambulate in a controlled environment, # of feet with assistance (PT). ?Flowsheets (Taken 06/15/2021 2310) ?LTG: Pt will ambulate in controlled environ  assist needed:: Moderate Assistance - Patient 50 - 74% ?LTG: Ambulation distance in controlled environment: 44ft with LRAD ?Goal: LTG Patient will ambulate in home environment (PT) ?Description: LTG: Patient will ambulate in home environment, # of feet with assistance (PT). ?Flowsheets (Taken 06/15/2021 2310) ?LTG: Pt will ambulate in home environ  assist needed::  Moderate Assistance - Patient 50 - 74% ?LTG: Ambulation distance in home environment: 71ft with LRAD ?  ?Problem: RH Wheelchair Mobility ?Goal: LTG Patient will propel w/c in controlled environment (PT) ?Description: LTG: Patient will propel wheelchair in controlled environment, # of feet with assist (PT) ?Flowsheets (Taken 06/15/2021 2310) ?LTG: Pt will propel w/c in controlled environ  assist needed:: Supervision/Verbal cueing ?LTG: Propel w/c distance in controlled environment: 112ft ?Goal: LTG Patient will propel w/c in home environment (PT) ?Description: LTG: Patient will propel wheelchair in home environment, # of feet with assistance (PT). ?Flowsheets (Taken 06/15/2021 2310) ?LTG: Pt will propel w/c in home environ  assist needed:: Supervision/Verbal cueing ?Distance: wheelchair distance in controlled environment: 50 ?  ?Problem: RH Stairs ?Goal: LTG Patient will ambulate up and down stairs w/assist (PT) ?Description: LTG: Patient will ambulate up and down # of stairs with assistance (PT) ?Flowsheets (Taken 06/15/2021 2310) ?LTG: Pt will ambulate up/down stairs assist needed:: Moderate Assistance - Patient 50 - 74% ?LTG: Pt will  ambulate up and down number of stairs: 4 steps with 1 rail ?  ?
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
## 2021-06-15 NOTE — Discharge Instructions (Addendum)
Inpatient Rehab Discharge Instructions  Henry Simmons Discharge date and time: No discharge date for patient encounter.   Activities/Precautions/ Functional Status: Activity: activity as tolerated Diet: Soft Wound Care: Routine skin checks Functional status:  ___ No restrictions     ___ Walk up steps independently ___ 24/7 supervision/assistance   ___ Walk up steps with assistance ___ Intermittent supervision/assistance  ___ Bathe/dress independently ___ Walk with walker     _x__ Bathe/dress with assistance ___ Walk Independently    ___ Shower independently ___ Walk with assistance    ___ Shower with assistance ___ No alcohol     ___ Return to work/school ________  COMMUNITY REFERRALS UPON DISCHARGE:    Home Health:   PT     OT     ST                   Agency: [facility] Phone: [phone]    Medical Equipment/Items Ordered:Bari Drop Arm Bedside Commode, Hospital Bed                                                 Agency/Supplier: [employer] [phone]  Special Instructions:  No driving smoking or alcoholSTROKE/TIA DISCHARGE INSTRUCTIONS SMOKING Cigarette smoking nearly doubles your risk of having a stroke & is the single most alterable risk factor  If you smoke or have smoked in the last 12 months, you are advised to quit smoking for your health. Most of the excess cardiovascular risk related to smoking disappears within a year of stopping. Ask you doctor about anti-smoking medications [facility] Quit Line: 1-800-QUIT NOW Free Smoking Cessation Classes [phone]  CHOLESTEROL Know your levels; limit fat & cholesterol in your diet  Lipid Panel     Component Value Date/Time   CHOL 173 06/08/2021 0501   TRIG 171 (H) 06/08/2021 0501   TRIG 175 (H) 06/08/2021 0501   HDL 21 (L) 06/08/2021 0501   CHOLHDL 8.2 06/08/2021 0501   VLDL 34 06/08/2021 0501   LDLCALC 118 (H) 06/08/2021 0501     Many patients benefit from treatment even if their cholesterol is at goal. Goal: Total  Cholesterol (CHOL) less than 160 Goal:  Triglycerides (TRIG) less than 150 Goal:  HDL greater than 40 Goal:  LDL (LDLCALC) less than 100   BLOOD PRESSURE American Stroke Association blood pressure target is less that 120/80 mm/Hg  Your discharge blood pressure is:  BP: 107/81 Monitor your blood pressure Limit your salt and alcohol intake Many individuals will require more than one medication for high blood pressure  DIABETES (A1c is a blood sugar average for last 3 months) Goal HGBA1c is under 7% (HBGA1c is blood sugar average for last 3 months)  Diabetes: No known diagnosis of diabetes    Lab Results  Component Value Date   HGBA1C 5.3 06/08/2021    Your HGBA1c can be lowered with medications, healthy diet, and exercise. Check your blood sugar as directed by your physician Call your physician if you experience unexplained or low blood sugars.  PHYSICAL ACTIVITY/REHABILITATION Goal is 30 minutes at least 4 days per week  Activity: Increase activity slowly, Therapies: Physical Therapy: Home Health Return to work:  Activity decreases your risk of heart attack and stroke and makes your heart stronger.  It helps control your weight and blood pressure; helps you relax and can improve your mood. Participate  in a regular exercise program. Talk with your doctor about the best form of exercise for you (dancing, walking, swimming, cycling).  DIET/WEIGHT Goal is to maintain a healthy weight  Your discharge diet is:  Diet Order             DIET DYS 3 Room service appropriate? Yes; Fluid consistency: Thin  Diet effective now                   liquids Your height is:  Height: 6\' 2"  (188 cm) Your current weight is: Weight: 79.9 kg Your Body Mass Index (BMI) is:  BMI (Calculated): 22.61 Following the type of diet specifically designed for you will help prevent another stroke. Your goal weight range is:   Your goal Body Mass Index (BMI) is 19-24. Healthy food habits can help reduce 3 risk  factors for stroke:  High cholesterol, hypertension, and excess weight.  RESOURCES Stroke/Support Group:  Call [phone]   STROKE EDUCATION PROVIDED/REVIEWED AND GIVEN TO PATIENT Stroke warning signs and symptoms How to activate emergency medical system (call 911). Medications prescribed at discharge. Need for follow-up after discharge. Personal risk factors for stroke. Pneumonia vaccine given: No Flu vaccine given: No My questions have been answered, the writing is legible, and I understand these instructions.  I will adhere to these goals & educational materials that have been provided to me after my discharge from the hospital.      My questions have been answered and I understand these instructions. I will adhere to these goals and the provided educational materials after my discharge from the hospital.  Patient/Caregiver Signature _______________________________ Date __________  Clinician Signature _______________________________________ Date __________  Please bring this form and your medication list with you to all your follow-up doctor's appointments.

## 2021-06-15 NOTE — Patient Care Conference (Signed)
Inpatient RehabilitationTeam Conference and Plan of Care Update ?Date: 06/15/2021   Time: 10:20 AM  ? ? ?Patient Name: Henry Simmons      ?Medical Record Number: [id number]  ?Date of Birth: 1970-04-30 ?Sex: Male         ?Room/Bed: 5C08C/5C08C-01 ?Payor Info: Payor: Advertising copywriter / Plan: Advertising copywriter OTHER / Product Type: *No Product type* /   ? ?Admit Date/Time:  06/14/2021  6:16 PM ? ?Primary Diagnosis:  Acute ischemic left MCA stroke ([facility]) ? ?Hospital Problems: Principal Problem: ?  Acute ischemic left MCA stroke ([facility]) ?Active Problems: ?  Right hemiplegia ([facility]) ?  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 [facility]) ?  Left middle cerebral artery stroke ([facility]) ? ? ? ?Expected Discharge Date: Expected Discharge Date:  (evals pending) ? ?Team Members Present: ?Physician leading conference: Dr. Claudette Laws ?Social Worker Present: Lavera Guise, BSW ?Nurse Present: Chana Bode, RN ?PT Present: Grier Rocher, PT ?OT Present: Annye English, OT ?SLP Present: Eilene Ghazi, SLP ?PPS Coordinator present : Fae Pippin, SLP ? ?   Current Status/Progress Goal Weekly Team Focus  ?Bowel/Bladder ? ?   Continent        ?Swallow/Nutrition/ Hydration ? ? Eval pending         ?ADL's ? ? eval pending  eval pending  eval pending   ?Mobility ? ? Eval pending  Eval pending  Eval pending   ?Communication ? ? Eval pending         ?Safety/Cognition/ Behavioral Observations ? Eval pending         ?Pain ? ?   N/A        ?Skin ? ?   N/A        ? ? ?Discharge Planning:  ?Discharing home with spouse as primary caregiver (works M-W 8:30-10:30 AM & 12-3PM). 2 steps to enter back door and home is DME acessible   ?Team Discussion: ?Patient with severe aphasia and apraxia post Right MCA; hx of cranial stenosis with orthostatic blood pressure changes.  ?Patient on target to meet rehab goals: ?yes, currently needs max assist for static standing and squat pivot transfers. Needs Total assist for ADLs ? ?*See Care Plan and progress notes for long  and short-term goals.  ? ?Revisions to Treatment Plan:  ?N/A ?  ?Teaching Needs: ?Safety, transfers, toileting, medications, dietary modifications, etc.  ?Current Barriers to Discharge: ?Decreased caregiver support and Home enviroment access/layout ?Wife works and he has 2ste to home ? ?Possible Resolutions to Barriers: ?Family education ?FMLA paperwork for wife ?HH follow up services ?  ? ? Medical Summary ?Current Status: Severe aphasia right hemiplegia, history of prior strokes, severe intracranial stenosis ? Barriers to Discharge: Medical stability ?  ?Possible Resolutions to [employer]: We will need to allow blood pressure to run on the high side to avoid reduced cerebral perfusion pressure ? ? ?Continued Need for Acute Rehabilitation Level of Care: The patient requires daily medical management by a physician with specialized training in physical medicine and rehabilitation for the following reasons: ?Direction of a multidisciplinary physical rehabilitation program to maximize functional independence : Yes ?Medical management of patient stability for increased activity during participation in an intensive rehabilitation regime.: Yes ?Analysis of laboratory values and/or radiology reports with any subsequent need for medication adjustment and/or medical intervention. : Yes ? ? ?I attest that I was present, lead the team conference, and concur with the assessment and plan of the team. ? ? ?Chana Bode B ?06/15/2021, 3:54 PM  ? ? ? ? ? ? ?
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
## 2021-06-15 NOTE — Plan of Care (Signed)
?Problem: RH Balance ?Goal: LTG: Patient will maintain dynamic sitting balance (OT) ?Description: LTG:  Patient will maintain dynamic sitting balance with assistance during activities of daily living (OT) ?Flowsheets (Taken 06/15/2021 1239) ?LTG: Pt will maintain dynamic sitting balance during ADLs with: Contact Guard/Touching assist ?Goal: LTG Patient will maintain dynamic standing with ADLs (OT) ?Description: LTG:  Patient will maintain dynamic standing balance with assist during activities of daily living (OT)  ?Flowsheets (Taken 06/15/2021 1239) ?LTG: Pt will maintain dynamic standing balance during ADLs with: Minimal Assistance - Patient > 75% ?  ?Problem: Sit to Stand ?Goal: LTG:  Patient will perform sit to stand in prep for activites of daily living with assistance level (OT) ?Description: LTG:  Patient will perform sit to stand in prep for activites of daily living with assistance level (OT) ?Flowsheets (Taken 06/15/2021 1239) ?LTG: PT will perform sit to stand in prep for activites of daily living with assistance level: Minimal Assistance - Patient > 75% ?  ?Problem: RH Eating ?Goal: LTG Patient will perform eating w/assist, cues/equip (OT) ?Description: LTG: Patient will perform eating with assist, with/without cues using equipment (OT) ?Flowsheets (Taken 06/15/2021 1239) ?LTG: Pt will perform eating with assistance level of: Supervision/Verbal cueing ?  ?Problem: RH Grooming ?Goal: LTG Patient will perform grooming w/assist,cues/equip (OT) ?Description: LTG: Patient will perform grooming with assist, with/without cues using equipment (OT) ?Flowsheets (Taken 06/15/2021 1239) ?LTG: Pt will perform grooming with assistance level of: Supervision/Verbal cueing ?  ?Problem: RH Bathing ?Goal: LTG Patient will bathe all body parts with assist levels (OT) ?Description: LTG: Patient will bathe all body parts with assist levels (OT) ?Flowsheets (Taken 06/15/2021 1239) ?LTG: Pt will perform bathing with assistance  level/cueing: Minimal Assistance - Patient > 75% ?  ?Problem: RH Dressing ?Goal: LTG Patient will perform upper body dressing (OT) ?Description: LTG Patient will perform upper body dressing with assist, with/without cues (OT). ?Flowsheets (Taken 06/15/2021 1239) ?LTG: Pt will perform upper body dressing with assistance level of: Minimal Assistance - Patient > 75% ?Goal: LTG Patient will perform lower body dressing w/assist (OT) ?Description: LTG: Patient will perform lower body dressing with assist, with/without cues in positioning using equipment (OT) ?Flowsheets (Taken 06/15/2021 1239) ?LTG: Pt will perform lower body dressing with assistance level of: Moderate Assistance - Patient 50 - 74% ?  ?Problem: RH Toileting ?Goal: LTG Patient will perform toileting task (3/3 steps) with assistance level (OT) ?Description: LTG: Patient will perform toileting task (3/3 steps) with assistance level (OT)  ?Flowsheets (Taken 06/15/2021 1239) ?LTG: Pt will perform toileting task (3/3 steps) with assistance level: Moderate Assistance - Patient 50 - 74% ?  ?Problem: RH Vision ?Goal: RH LTG Vision (Specify) ?Flowsheets (Taken 06/15/2021 1239) ?LTG: Vision Goals: Pt will locate all desired ADL items on sink during morning routine with no more than min VCs using visual scanning strategies. ?  ?Problem: RH Functional Use of Upper Extremity ?Goal: LTG Patient will use RT/LT upper extremity as a (OT) ?Description: LTG: Patient will use right/left upper extremity as a stabilizer/gross assist/diminished/nondominant/dominant level with assist, with/without cues during functional activity (OT) ?Flowsheets (Taken 06/15/2021 1239) ?LTG: Use of upper extremity in functional activities: RUE as a stabilizer ?LTG: Pt will use upper extremity in functional activity with assistance level of: Total Assistance - Patient < 25% ?  ?Problem: RH Toilet Transfers ?Goal: LTG Patient will perform toilet transfers w/assist (OT) ?Description: LTG: Patient will perform  toilet transfers with assist, with/without cues using equipment (OT) ?Flowsheets (Taken 06/15/2021 1239) ?LTG: Pt  will perform toilet transfers with assistance level of: Moderate Assistance - Patient 50 - 74% ?  ?Problem: RH Tub/Shower Transfers ?Goal: LTG Patient will perform tub/shower transfers w/assist (OT) ?Description: LTG: Patient will perform tub/shower transfers with assist, with/without cues using equipment (OT) ?Flowsheets (Taken 06/15/2021 1239) ?LTG: Pt will perform tub/shower stall transfers with assistance level of: Moderate Assistance - Patient 50 - 74% ?  ?

## 2021-06-15 NOTE — Plan of Care (Signed)
?  Problem: RH Swallowing ?Goal: LTG Patient will consume least restrictive diet using compensatory strategies with assistance (SLP) ?Description: LTG:  Patient will consume least restrictive diet using compensatory strategies with assistance (SLP) ?Flowsheets (Taken 06/15/2021 2129) ?LTG: Pt Patient will consume least restrictive diet using compensatory strategies with assistance of (SLP): Minimal Assistance - Patient > 75% ?  ?Problem: RH Comprehension Communication ?Goal: LTG Patient will comprehend basic/complex auditory (SLP) ?Description: LTG: Patient will comprehend basic/complex auditory information with cues (SLP). ?Flowsheets (Taken 06/15/2021 2129) ?LTG: Patient will comprehend: Basic auditory information ?LTG: Patient will comprehend auditory information with cueing (SLP): Moderate Assistance - Patient 50 - 74% ?  ?Problem: RH Expression Communication ?Goal: LTG Patient will express needs/wants via multi-modal(SLP) ?Description: LTG:  Patient will express needs/wants via multi-modal communication (gestures/written, etc) with cues (SLP) ?Flowsheets (Taken 06/15/2021 2129) ?LTG: Patient will express needs/wants via multimodal communication (gestures/written, etc) with cueing (SLP): Maximal Assistance - Patient 25 - 49% ?  ?
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
## 2021-06-16 DIAGNOSIS — I63512 Cerebral infarction due to unspecified occlusion or stenosis of left middle cerebral artery: Secondary | ICD-10-CM | POA: Diagnosis not present

## 2021-06-16 LAB — GLUCOSE, CAPILLARY
Glucose-Capillary: 113 mg/dL — ABNORMAL HIGH (ref 70–99)
Glucose-Capillary: 129 mg/dL — ABNORMAL HIGH (ref 70–99)

## 2021-06-16 NOTE — Progress Notes (Signed)
Patient ID: Henry Simmons, male   DOB: 06/11/1970, 51 y.o.   MRN: [id number] ?Met with the patient and wife to review rehab process, team conference and plan of care. Discussed current situation and secondary risk management including HTN, HLD (LDL 118/Trig 175), CAD (ASA + Plavix) and Heart block ([place]) and smoking (patch). Reviewed HH dietary modifications; poor appetite, wife to order food items patient likes to eat. Wife expressed concerns about communication with the team regarding progress with therapy. Reviewed my chart access and latest therapy notes. Continue to follow along to discharge to address educational needs to facilitate preparation for discharge home. [name] ? ?

## 2021-06-16 NOTE — Progress Notes (Signed)
?                                                       PROGRESS NOTE ? ? ?Subjective/Complaints: ? ?Aphasic, but alert , follows simple commands such as close eyes, raise Left arm or Left leg  ? ?ROS- aphasic  ? ?Objective: ?  ?No results found. ?Recent Labs  ?  06/14/21 ?1857 06/15/21 ?[id number]  ?WBC 6.5 6.7  ?HGB 12.7* 12.5*  ?HCT 38.4* 38.5*  ?PLT 281 280  ? ? ?Recent Labs  ?  06/14/21 ?0107 06/14/21 ?1857 06/15/21 ?[id number]  ?NA 136  --  138  ?K 4.3  --  4.4  ?CL 100  --  104  ?CO2 24  --  25  ?GLUCOSE 116*  --  115*  ?BUN 28*  --  35*  ?CREATININE 1.29* 1.39* 1.37*  ?CALCIUM 9.6  --  9.6  ? ? ? ?Intake/Output Summary (Last 24 hours) at 06/16/2021 0836 ?Last data filed at 06/16/2021 [id number] ?Gross per 24 hour  ?Intake 700 ml  ?Output --  ?Net 700 ml  ? ?  ? ?  ? ?Physical Exam: ?Vital Signs ?Blood pressure 108/82, pulse 85, temperature 98.6 ?F (37 ?C), temperature source Oral, resp. rate 18, height 6\' 2"  (1.88 m), weight 79.6 kg, SpO2 100 %. ? ? ?General: No acute distress ?Mood and affect are appropriate ?Heart: Regular rate and rhythm no rubs murmurs or extra sounds ?Lungs: Clear to auscultation, breathing unlabored, no rales or wheezes ?Abdomen: Positive bowel sounds, soft nontender to palpation, nondistended ?Extremities: No clubbing, cyanosis, or edema ?Skin: No evidence of breakdown, no evidence of rash ?Neurologic: Cranial nerves II through XII intact, motor strength is 5/5 in left and 0/5 right deltoid, bicep, tricep, grip, hip flexor, knee extensors, ankle dorsiflexor and plantar flexor ?Sensory exam cannot assess due to aphasia  ? ?Musculoskeletal: No pain with RUE or LE PROM or LUE, LLE AROM  ? ? ?Assessment/Plan: ?1. Functional deficits which require 3+ hours per day of interdisciplinary therapy in a comprehensive inpatient rehab setting. ?Physiatrist is providing close team supervision and 24 hour management of active medical problems listed below. ?Physiatrist and rehab team continue to assess barriers to  discharge/monitor patient progress toward functional and medical goals ? ?Care Tool: ? ?Bathing ?   ?Body parts bathed by patient: Face  ?   ?  ?  ?Bathing assist Assist Level: Moderate Assistance - Patient 50 - 74% ?  ?  ?Upper Body Dressing/Undressing ?Upper body dressing   ?What is the patient wearing?: Pull over shirt ?   ?Upper body assist Assist Level: Maximal Assistance - Patient 25 - 49% ?   ?Lower Body Dressing/Undressing ?Lower body dressing ? ? ?   ?What is the patient wearing?: Pants ? ?  ? ?Lower body assist Assist for lower body dressing: Total Assistance - Patient < 25% ?   ? ?Toileting ?Toileting    ?Toileting assist Assist for toileting: Dependent - Patient 0% (stedy) ?  ?  ?Transfers ?Chair/bed transfer ? ?Transfers assist ?   ? ?Chair/bed transfer assist level: Maximal Assistance - Patient 25 - 49% ?  ?  ?Locomotion ?Ambulation ? ? ?Ambulation assist ? ? Ambulation activity did not occur: Safety/medical concerns ? ?  ?  ?   ? ?Walk 10 feet activity ? ? ?  Assist ? Walk 10 feet activity did not occur: Safety/medical concerns ? ?  ?   ? ?Walk 50 feet activity ? ? ?Assist Walk 50 feet with 2 turns activity did not occur: Safety/medical concerns ? ?  ?   ? ? ?Walk 150 feet activity ? ? ?Assist Walk 150 feet activity did not occur: Safety/medical concerns ? ?  ?  ?  ? ?Walk 10 feet on uneven surface  ?activity ? ? ?Assist Walk 10 feet on uneven surfaces activity did not occur: Safety/medical concerns ? ? ?  ?   ? ?Wheelchair ? ? ? ? ?Assist Is the patient using a wheelchair?: Yes ?Type of Wheelchair: Manual ?  ? ?Wheelchair assist level: Maximal Assistance - Patient 25 - 49% ?Max wheelchair distance: 150  ? ? ?Wheelchair 50 feet with 2 turns activity ? ? ? ?Assist ? ?  ?  ? ? ?Assist Level: Maximal Assistance - Patient 25 - 49%  ? ?Wheelchair 150 feet activity  ? ? ? ?Assist ?   ? ? ?Assist Level: Maximal Assistance - Patient 25 - 49%  ? ?Blood pressure 108/82, pulse 85, temperature 98.6 ?F (37 ?C),  temperature source Oral, resp. rate 18, height 6\' 2"  (1.88 m), weight 79.6 kg, SpO2 100 %. ? ?Medical Problem List and Plan: ?1. 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            -patient may  shower ?            -ELOS/Goals: 14-20 days min A for SLP,PT and OT ? ?2.  Antithrombotics: ?-DVT/anticoagulation:  Pharmaceutical: Lovenox ?            -antiplatelet therapy: Aspirin 81 mg daily and Plavix 75 mg daily ?3. Pain Management: Tylenol as needed ?4. Mood: Provide emotional support ?            -antipsychotic agents: N/A ?5. Neuropsych: This patient is not capable of making decisions on his own behalf. ?6. Skin/Wound Care: Routine skin checks ?7. Fluids/Electrolytes/Nutrition: Routine in and outs with follow-up chemistries ?8.  CAD/LAD stent/non-STEMI.  Continue aspirin and Plavix.  Follow-up per cardiology services ?9.  Hypertrophic cardiomyopathy/bradycardia.  Initially with temporary pacer.  No current plan for permanent pacemaker.  Continue Jardiance 10 mg daily. ?10.  Hypertension.  Norvasc 2.5 mg daily, hydralazine 10 mg twice daily, Isordil 10 mg twice daily,Lopressor 12.5 mg BID, Cozaar 100 mg daily. Monitor with increased mobility ?Vitals:  ? 06/15/21 2104 06/16/21 0658  ?BP: 130/83 108/82  ?Pulse: 81 85  ?Resp: 17 18  ?Temp: 98.7 ?F (37.1 ?C) 98.6 ?F (37 ?C)  ?SpO2: 100% 100%  ?Given intracranial stenosis would allow BP to run upper normal 130-140s, will decrease Cozaar  ?No sig change lying to sitting  ? ?11.  CKD stage III.  Follow-up chemistries- ? ?  Latest Ref Rng & Units 06/15/2021  ?  5:23 AM 06/14/2021  ?  6:57 PM 06/14/2021  ?  1:07 AM  ?BMP  ?Glucose 70 - 99 mg/dL 115    116    ?BUN 6 - 20 mg/dL 35    28    ?Creatinine 0.61 - 1.24 mg/dL 1.37   1.39   1.29    ?Sodium 135 - 145 mmol/L 138    136    ?Potassium 3.5 - 5.1 mmol/L 4.4    4.3    ?Chloride 98 -  111 mmol/L 104    100    ?CO2  22 - 32 mmol/L 25    24    ?Calcium 8.9 - 10.3 mg/dL 9.6    9.6    ?BUN increasing will enc po fluids, recheck in am  ? ?12.  Hyperlipidemia.  Lipitor ?  ? ?LOS: ?2 days ?A FACE TO FACE EVALUATION WAS PERFORMED ? ?Henry Simmons ?06/16/2021, 8:36 AM  ? ? ? ?

## 2021-06-16 NOTE — Progress Notes (Signed)
Speech Language Pathology Daily Session Note ? ?Patient Details  ?Name: Henry Simmons ?MRN: [id number] ?Date of Birth: May 07, 1970 ? ?Today's Date: 06/16/2021 ?SLP Individual Time: 2707-8675 ?SLP Individual Time Calculation (min): 30 min ? ?Short Term Goals: ?Week 1: SLP Short Term Goal 1 (Week 1): Pt will answer basic yes/no questions with 50% accuracy via multimodal means with Max A ?SLP Short Term Goal 2 (Week 1): Pt will follow one-step commands within context of functional task on 50% of trials given Max A. ?SLP Short Term Goal 3 (Week 1): Pt will complete receptive ID of common objects and pictures with 80% accuracy given Min A. ?SLP Short Term Goal 4 (Week 1): Pt will vocalize/phonation x 1 given Max A. ?SLP Short Term Goal 5 (Week 1): Pt will utilize multimodal communication methods to communicate wants, needs, thoughts, and ideas x 5 with Max A. ? ?Skilled Therapeutic Interventions: Skilled treatment session focused on functional communication goals. SLP facilitated session by providing Max A multimodal cues for initiation of vocalizing/phonation on command. Patient unable to produce volitionally but vocalizations were made when patient laughed and cleared his throat X 1. Patient answered basic questions regarding biographical information and orientation from choices that were written vertically from a field of 2  with 80% accuracy.  SLP also attempted to establish a reliable "no" response. Max tactile and visual cues were needed for patient to nod his "no" and for attempting to use a "thumbs down." Patient would essentially place his "thumbs up" sideways or place his hand back on the bed to indicate "no", but again, it was unreliable. Patient left upright in bed with alarm on and all needs within reach. Continue with current plan of care.  ?   ? ?Pain ?No indications of pain  ? ?Therapy/Group: Individual Therapy ? ?Kiri Hinderliter ?06/16/2021, 12:49 PM ?

## 2021-06-16 NOTE — Progress Notes (Signed)
Physical Therapy Session Note ? ?Patient Details  ?Name: Henry Simmons ?MRN: [id number] ?Date of Birth: Mar 14, 1970 ? ?Today's Date: 06/16/2021 ?PT Individual Time: 0850-1000 ?PT Individual Time Calculation (min): 70 min  ? ?Short Term Goals: ?Week 1:  PT Short Term Goal 1 (Week 1): pt will transfer to [facility] with mod assist ?PT Short Term Goal 2 (Week 1): Pt will ambulate 68f with max assist and LRAD ?PT Short Term Goal 3 (Week 1): Pt pt will propell WC 1086fwith min assist ? ?Skilled Therapeutic Interventions/Progress Updates:  ? ?Pt received sitting in recliner and agreeable to PT. Pt performed stedy transfer with mod-max assist to prevent LOB to the R. Visual feedback from mirror  for midline  ? ?[facility] at lite gait with mod assist and RLE blocked. Standing tolerance/balance to don harness x 3 minutes.  ? ?BWSTT 3 min x 2 totaling 50029f.4-0.5 mph. Total A to facilities step length and knee/hip extension on the RLE. Therapeutic rest break between bouts.  ?Gait training at rail in hall x 39f4fth max assist +2 with noted improvement in activation in  limb advancment and intermittent knee extension on the RLE. Contiues to required max assist to block RLE throughout ? ?Pt returned to room and performed stand pivot transfer to bed with Max assist. Sit>supine completed with mod assist from PT on the R and left supine in bed with call bell in reach and all needs met.  ? ? ? ?   ? ?Therapy Documentation ?Precautions:  ?Precautions ?Precaution Comments: R hemiparesis, apraxic, global aphasia, diplopia ?Restrictions ?Weight Bearing Restrictions: No ? ?Vital Signs: ?Therapy Vitals ?Temp: 98.6 ?F (37 ?C) ?Temp Source: Oral ?Pulse Rate: 85 ?Resp: 18 ?BP: 108/82 ?Patient Position (if appropriate): Lying ?Oxygen Therapy ?SpO2: 100 % ?O2 Device: Room Air ?Pain: ?Pain Assessment ?Pain Scale: 0-10 ?Pain Score: 0-No pain ? ? ?Therapy/Group: Individual Therapy ? ?AustLorie Phenix4/2023, 10:05 AM   ?

## 2021-06-16 NOTE — Progress Notes (Signed)
Occupational Therapy Session Note ? ?Patient Details  ?Name: Henry Simmons ?MRN: [id number] ?Date of Birth: [birth year] ? ?Today's Date: 06/16/2021 ?OT Individual Time: [phone] ?OT Individual Time Calculation (min): 53 min  ? ? ?Short Term Goals: ?Week 1:  OT Short Term Goal 1 (Week 1): Pt will complete ADL task EOB with no more than min A for balance. ?OT Short Term Goal 2 (Week 1): Pt will don shirt in supported sitting with min A. ?OT Short Term Goal 3 (Week 1): Pt will complete grooming task with no more than min A. ?OT Short Term Goal 4 (Week 1): Pt will complete toilet transfer with max A and LRAD. ? ?Skilled Therapeutic Interventions/Progress Updates:  ?  Session 1 [phone]): Pt received semi-reclined in bed with wife present, no s/sx pain throughout, agreeable to therapy. Session focus on self-care retraining, activity tolerance, transfer retraining, R attention, midline orientation in prep for improved ADL/IADL/func mobility performance + decreased caregiver burden. Came to sitting EOB with mod A to progress RLE off bed and to lift trunk. Mod to max A for static sitting balance without LUE support on rail due to R LOB. Mod A for balance and to pull dirty shirt off over head, donned new shirt with max A for balance and to thread RUE. Total A with use of stedy to don pants. Powers up in stedy with mod A, but mod to max A to prevent pushing to the R.  Stedy transfer > recliner. Completed oral care seated with overall S , consistent cues to expel water from oral cavity and for thoroughness. Perseverates on returning to brush teeth, was brushing side of cup with no awareness. ? ?Midline orientation in stedy and in recliner improved with cues to look in mirror for visual feedback. Self-fed breakfast with overall close S to min A to achieve sufficient grasp with nondominant L hand on utensils. Cues for small/slow bites and to clear oral cavity/R pocketing per SLP swallowing sheet. Food rearranged to be on R side  to force R visual attention, although pt with poor intake overall and did not initiate eating anything on the R despite cuing/encouragement.  ? ?R forearm IV noted to have potentially slipped out when doffing shirt - RN notified and later present to remove. ? ?Pt left seated in recliner with safety belt alarm engaged, call bell in reach, and all immediate needs met.  ? ? ?Therapy Documentation ?Precautions:  ?Precautions ?Precaution Comments: R hemiparesis, apraxic, global aphasia, diplopia ?Restrictions ?Weight Bearing Restrictions: No ? ?Pain: No s/sx throughout ?ADL: See Care Tool for more details. ? ? ?Therapy/Group: Individual Therapy ? ?Volanda Napoleon MS, OTR/L ? ?06/16/2021, 6:53 AM ?

## 2021-06-17 DIAGNOSIS — I63512 Cerebral infarction due to unspecified occlusion or stenosis of left middle cerebral artery: Secondary | ICD-10-CM | POA: Diagnosis not present

## 2021-06-17 MED ORDER — LOSARTAN POTASSIUM 50 MG PO TABS
25.0000 mg | ORAL_TABLET | Freq: Every day | ORAL | Status: DC
  Administered 2021-06-18 – 2021-06-20 (×3): 25 mg via ORAL
  Filled 2021-06-17 (×3): qty 1

## 2021-06-17 MED ORDER — SODIUM CHLORIDE 0.45 % IV SOLN: INTRAVENOUS | Status: DC

## 2021-06-17 NOTE — Progress Notes (Signed)
?                                                       PROGRESS NOTE ? ? ?Subjective/Complaints: ? ?Aphasic, but alert , working with SLP, can raise arm to command but unable to ID body parts, needs hand over hand for apraxia  ? ?ROS- aphasic  ? ?Objective: ?  ?No results found. ?Recent Labs  ?  06/14/21 ?1857 06/15/21 ?[id number]  ?WBC 6.5 6.7  ?HGB 12.7* 12.5*  ?HCT 38.4* 38.5*  ?PLT 281 280  ? ? ?Recent Labs  ?  06/14/21 ?1857 06/15/21 ?[id number]  ?NA  --  138  ?K  --  4.4  ?CL  --  104  ?CO2  --  25  ?GLUCOSE  --  115*  ?BUN  --  35*  ?CREATININE 1.39* 1.37*  ?CALCIUM  --  9.6  ? ? ? ?Intake/Output Summary (Last 24 hours) at 06/17/2021 0843 ?Last data filed at 06/16/2021 1820 ?Gross per 24 hour  ?Intake 240 ml  ?Output --  ?Net 240 ml  ? ?  ? ?  ? ?Physical Exam: ?Vital Signs ?Blood pressure 112/77, pulse 75, temperature 98.4 ?F (36.9 ?C), temperature source Oral, resp. rate 14, height 6\' 2"  (1.88 m), weight 79.6 kg, SpO2 100 %. ? ? ?General: No acute distress ?Mood and affect are appropriate ?Heart: Regular rate and rhythm no rubs murmurs or extra sounds ?Lungs: Clear to auscultation, breathing unlabored, no rales or wheezes ?Abdomen: Positive bowel sounds, soft nontender to palpation, nondistended ?Extremities: No clubbing, cyanosis, or edema ?Skin: No evidence of breakdown, no evidence of rash ?Neurologic: Cranial nerves II through XII intact, motor strength is 5/5 in left and 0/5 right deltoid, bicep, tricep, grip, hip flexor, knee extensors, ankle dorsiflexor and plantar flexor ?Sensory exam cannot assess due to aphasia  ? ?Musculoskeletal: No pain with RUE or LE PROM or LUE, LLE AROM  ? ? ?Assessment/Plan: ?1. Functional deficits which require 3+ hours per day of interdisciplinary therapy in a comprehensive inpatient rehab setting. ?Physiatrist is providing close team supervision and 24 hour management of active medical problems listed below. ?Physiatrist and rehab team continue to assess barriers to discharge/monitor  patient progress toward functional and medical goals ? ?Care Tool: ? ?Bathing ?   ?Body parts bathed by patient: Face  ?   ?  ?  ?Bathing assist Assist Level: Moderate Assistance - Patient 50 - 74% ?  ?  ?Upper Body Dressing/Undressing ?Upper body dressing   ?What is the patient wearing?: Pull over shirt ?   ?Upper body assist Assist Level: Maximal Assistance - Patient 25 - 49% ?   ?Lower Body Dressing/Undressing ?Lower body dressing ? ? ?   ?What is the patient wearing?: Pants ? ?  ? ?Lower body assist Assist for lower body dressing: Total Assistance - Patient < 25% ?   ? ?Toileting ?Toileting    ?Toileting assist Assist for toileting: Moderate Assistance - Patient 50 - 74% ?  ?  ?Transfers ?Chair/bed transfer ? ?Transfers assist ?   ? ?Chair/bed transfer assist level: Maximal Assistance - Patient 25 - 49% ?  ?  ?Locomotion ?Ambulation ? ? ?Ambulation assist ? ? Ambulation activity did not occur: Safety/medical concerns ? ?  ?  ?   ? ?Walk 10 feet activity ? ? ?  Assist ? Walk 10 feet activity did not occur: Safety/medical concerns ? ?  ?   ? ?Walk 50 feet activity ? ? ?Assist Walk 50 feet with 2 turns activity did not occur: Safety/medical concerns ? ?  ?   ? ? ?Walk 150 feet activity ? ? ?Assist Walk 150 feet activity did not occur: Safety/medical concerns ? ?  ?  ?  ? ?Walk 10 feet on uneven surface  ?activity ? ? ?Assist Walk 10 feet on uneven surfaces activity did not occur: Safety/medical concerns ? ? ?  ?   ? ?Wheelchair ? ? ? ? ?Assist Is the patient using a wheelchair?: Yes ?Type of Wheelchair: Manual ?  ? ?Wheelchair assist level: Maximal Assistance - Patient 25 - 49% ?Max wheelchair distance: 150  ? ? ?Wheelchair 50 feet with 2 turns activity ? ? ? ?Assist ? ?  ?  ? ? ?Assist Level: Maximal Assistance - Patient 25 - 49%  ? ?Wheelchair 150 feet activity  ? ? ? ?Assist ?   ? ? ?Assist Level: Maximal Assistance - Patient 25 - 49%  ? ?Blood pressure 112/77, pulse 75, temperature 98.4 ?F (36.9 ?C), temperature  source Oral, resp. rate 14, height 6\' 2"  (1.88 m), weight 79.6 kg, SpO2 100 %. ? ?Medical Problem List and Plan: ?1. 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            -patient may  shower ?            -ELOS/Goals: 14-20 days min A for SLP,PT and OT ? ?2.  Antithrombotics: ?-DVT/anticoagulation:  Pharmaceutical: Lovenox ?            -antiplatelet therapy: Aspirin 81 mg daily and Plavix 75 mg daily ?3. Pain Management: Tylenol as needed ?4. Mood: Provide emotional support ?            -antipsychotic agents: N/A ?5. Neuropsych: This patient is not capable of making decisions on his own behalf. ?6. Skin/Wound Care: Routine skin checks ?7. Fluids/Electrolytes/Nutrition: Routine in and outs with follow-up chemistries ?8.  CAD/LAD stent/non-STEMI.  Continue aspirin and Plavix.  Follow-up per cardiology services ?9.  Hypertrophic cardiomyopathy/bradycardia.  Initially with temporary pacer.  No current plan for permanent pacemaker.  Continue Jardiance 10 mg daily. ?10.  Hypertension.  Norvasc 2.5 mg daily, hydralazine 10 mg twice daily, Isordil 10 mg twice daily,Lopressor 12.5 mg BID, Cozaar 100 mg daily. Monitor with increased mobility ?Vitals:  ? 06/16/21 1948 06/17/21 0437  ?BP: 119/82 112/77  ?Pulse: 78 75  ?Resp: 14 14  ?Temp: 98.3 ?F (36.8 ?C) 98.4 ?F (36.9 ?C)  ?SpO2: 99% 100%  ?Given intracranial stenosis would allow BP to run upper normal 130-140s, will decrease Cozaar to 25mg  starting 5/6 ?No sig change lying to sitting  ? ?11.  CKD stage III.  Follow-up chemistries- ? ?  Latest Ref Rng & Units 06/15/2021  ?  5:23 AM 06/14/2021  ?  6:57 PM 06/14/2021  ?  1:07 AM  ?BMP  ?Glucose 70 - 99 mg/dL 115    116    ?BUN 6 - 20 mg/dL 35    28    ?Creatinine 0.61 - 1.24 mg/dL 1.37   1.39   1.29    ?Sodium 135 - 145 mmol/L 138    136    ?Potassium 3.5 - 5.1 mmol/L 4.4    4.3    ?  Chloride 98 - 111 mmol/L 104     100    ?CO2 22 - 32 mmol/L 25    24    ?Calcium 8.9 - 10.3 mg/dL 9.6    9.6    ? ?BUN/Creat creeping up but GFR still normal , reducing Cozaar should help , enc po fluid , if intake < 1061ml needs IV overnite  ?12.  Hyperlipidemia.  Lipitor ?  ? ?LOS: ?3 days ?A FACE TO FACE EVALUATION WAS PERFORMED ? ?Luanna Salk Nikeia Henkes ?06/17/2021, 8:43 AM  ? ? ? ?

## 2021-06-17 NOTE — IPOC Note (Signed)
Overall Plan of Care (IPOC) ?Patient Details ?Name: Henry Simmons ?MRN: [id number] ?DOB: 10/24/1970 ? ?Admitting Diagnosis: Acute ischemic left MCA stroke (HCC) ? ?Hospital Problems: Principal Problem: ?  Acute ischemic left MCA stroke (HCC) ?Active Problems: ?  Right hemiplegia (HCC) ?  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  Left middle cerebral artery stroke (HCC) ? ? ? ? Functional Problem List: ?Nursing Bowel, Bladder, Endurance, Pain, Medication Management, Safety  ?PT Balance, Behavior, Edema, Endurance, Motor, Nutrition, Pain, Perception, Safety, Sensory  ?OT Balance, Sensory, Cognition, Behavior, Endurance, Motor, Perception, Safety, Vision  ?SLP Linguistic, Motor  ?TR    ?    ? Basic ADL?s: ?OT Eating, Grooming, Bathing, Dressing, Toileting  ? ?  Advanced  ADL?s: ?OT    ?   ?Transfers: ?PT Bed Mobility, Bed to Chair, Car, Furniture, Floor  ?OT Toilet, Tub/Shower  ? ?  Locomotion: ?PT Ambulation, Wheelchair Mobility, Stairs  ? ?  Additional Impairments: ?OT Fuctional Use of Upper Extremity  ?SLP Swallowing, Communication ?comprehension, expression ?   ?TR    ? ? ?Anticipated Outcomes ?Item Anticipated Outcome  ?Self Feeding S  ?Swallowing ? Min A for least restrictive diet and use of safe swallowing strategies ?  ?Basic self-care ? min A  ?Toileting ? mod A ?  ?Bathroom Transfers mod A  ?Bowel/Bladder ? manage bowel w mod I and bladder w toileting  ?Transfers ? min assist with LRAD  ?Locomotion ? min assist with LRAD  ?Communication ? Mod A for auditory comprehension; Max A for expression of wants/needs via multimodal means  ?Cognition ? N/A  ?Pain ? pain at or below level 4 w prns  ?Safety/Judgment ? maintain safety w cues  ? ?Therapy Plan: ?PT Intensity: Minimum of 1-2 x/day ,45 to 90 minutes ?PT Frequency: 5 out of 7 days ?PT Duration Estimated Length of Stay: 30-32 days ?OT Intensity: Minimum of 1-2 x/day, 45 to 90 minutes ?OT Frequency: 5 out of 7 days ?OT Duration/Estimated Length of Stay: 4-4.45  weeks ?SLP Intensity: Minumum of 1-2 x/day, 30 to 90 minutes ?SLP Frequency: 3 to 5 out of 7 days ?SLP Duration/Estimated Length of Stay: 4 weeks  ? ?Due to the current state of emergency, patients may not be receiving their 3-hours of Medicare-mandated therapy. ? ? Team Interventions: ?Nursing Interventions Bladder Management, Disease Management/Prevention, Medication Management, Pain Management, Bowel Management, Patient/Family Education  ?PT interventions Ambulation/gait training, Cognitive remediation/compensation, Warden/ranger, Community reintegration, Discharge planning, Disease management/prevention, Functional mobility training, Functional electrical stimulation, DME/adaptive equipment instruction, Neuromuscular re-education, Pain management, Patient/family education, Therapeutic Exercise, Splinting/orthotics, Skin care/wound management, Psychosocial support, Stair training, Therapeutic Activities, UE/LE Strength taining/ROM, UE/LE Coordination activities, Visual/perceptual remediation/compensation, Wheelchair propulsion/positioning  ?OT Interventions Balance/vestibular training, Disease mangement/prevention, Neuromuscular re-education, Self Care/advanced ADL retraining, Wheelchair propulsion/positioning, Therapeutic Exercise, UE/LE Strength taining/ROM, Skin care/wound managment, Pain management, DME/adaptive equipment instruction, Cognitive remediation/compensation, Community reintegration, Functional electrical stimulation, Patient/family education, UE/LE Coordination activities, Splinting/orthotics, Psychosocial support, Functional mobility training, Discharge planning, Therapeutic Activities, Visual/perceptual remediation/compensation  ?SLP Interventions Cueing hierarchy, Dysphagia/aspiration precaution training, Environmental controls, Functional tasks, Multimodal communication approach, Patient/family education, Speech/Language facilitation  ?TR Interventions    ?SW/CM Interventions  Discharge Planning, Psychosocial Support, Patient/Family Education, Disease Management/Prevention  ? ?Barriers to Discharge ?MD  Medical stability and severe aphasia and apraxia  ?Nursing Decreased caregiver support, Home environment access/layout ?1 leve  2ste no rails w spouse; working PTA  ?PT Inaccessible home environment, Incontinence, Insurance for SNF coverage ?   ?OT Inaccessible home environment, Decreased caregiver  support, Home environment access/layout, Incontinence ?   ?SLP   ?   ?SW Insurance for SNF coverage, Lack of/limited family support, Decreased caregiver support ?   ? ?Team Discharge Planning: ?Destination: PT-Home ,OT- Home , SLP-Home ?Projected Follow-up: PT-Home health PT, OT-  24 hour supervision/assistance, SLP-Outpatient SLP, Home Health SLP, 24 hour supervision/assistance ?Projected Equipment Needs: PT-To be determined, Wheelchair (measurements), Wheelchair cushion (measurements), Rolling walker with 5" wheels, OT- To be determined, SLP-None recommended by SLP ?Equipment Details: PT- , OT-  ?Patient/family involved in discharge planning: PT- Patient,  OT-Patient unable/family or caregiver not available, SLP-Patient unable/family or caregive not available ? ?MD ELOS:21-23d ?Medical Rehab Prognosis:  Good ?Assessment: The patient has been admitted for CIR therapies with the diagnosis of Left MCA infarct . The team will be addressing functional mobility, strength, stamina, balance, safety, adaptive techniques and equipment, self-care, bowel and bladder mgt, patient and caregiver education, communication . Goals have been set at min A. Anticipated discharge destination is Home . ? ? ? ?  ? ? ?See Team Conference Notes for weekly updates to the plan of care  ?

## 2021-06-17 NOTE — Progress Notes (Signed)
Physical Therapy Session Note ? ?Patient Details  ?Name: Henry Simmons ?MRN: [id number] ?Date of Birth: [birth year] ? ?Today's Date: 06/17/2021 ?PT Individual Time: 1405-1500 ?PT Individual Time Calculation (min): 55 min  ? ?Short Term Goals: ?Week 1:  PT Short Term Goal 1 (Week 1): pt [facility] to [facility] with mod assist ?PT Short Term Goal 2 (Week 1): Pt will ambulate 31f with max assist and LRAD ?PT Short Term Goal 3 (Week 1): Pt pt will propell WC 1039fwith min assist ?Week 2:    ? ?Skilled Therapeutic Interventions/Progress Updates:  ? ?Pt received supine in bed and agreeable to PT. Supine>sit transfer with max assist and cues for log roll technique. [facility] due to motor planning deficits. [facility] 3 musketeer x 5 with max assist +2.  Gait training with 3 msuketeer [id number] max assist +2 for forced use of RLE with intermittent knee extension on the RLE. Standing balance to performed foot tap on 6 inch step with the LLE to force WBthrough the RLE with max-total A to facilitate knee AND HIP extension in three musketeer. Nustep reciprocal movement training x 8 minutes BLE only with min=mod assist for neutral hip ER/IR and to prevent use of LUE. WC mobility LLE only x [id number] min assist due to poor motor planning.  Pt returned to room and performed stand pivot transfer to bed with max assist on LUE on bed rail. Sit>supine completed with max assist, and left supine in bed with call bell in reach and all needs met.  ? ? ? ?   ? ?Therapy Documentation ?Precautions:  ?Precautions ?Precaution Comments: R hemiparesis, apraxic, global aphasia, diplopia ?Restrictions ?Weight Bearing Restrictions: No ? ?Pain: ?denies ? ? ? ?Therapy/Group: Individual Therapy ? ?AustiLorie Phenix/2023, 3:30 PM  ?

## 2021-06-17 NOTE — Progress Notes (Signed)
Speech Language Pathology Daily Session Note ? ?Patient Details  ?Name: RHYLEN SHAHEEN ?MRN: [id number] ?Date of Birth: June 11, 1970 ? ?Today's Date: 06/17/2021 ?SLP Individual Time: 8588-5027 ?SLP Individual Time Calculation (min): 60 min ? ?Short Term Goals: ?Week 1: SLP Short Term Goal 1 (Week 1): Pt will answer basic yes/no questions with 50% accuracy via multimodal means with Max A. - ~75% accuracy via multimodal means (established designated system (thumbs up for "yes" and head shake for "no") given Max multimodal A to include tactile cues to each shoulder for shaking head "no" and tapping thumb for upward movement to indicate "yes." ? ?SLP Short Term Goal 2 (Week 1): Pt will follow one-step commands within context of functional task on 50% of trials given Max A. -  ~50% accuracy with melody and Max multimodal A. ? ?SLP Short Term Goal 3 (Week 1): Pt will complete receptive ID of common objects and pictures with 80% accuracy given Min A. - ~60% of pictures of common nouns with Min A for attention to R field. ? ?SLP Short Term Goal 4 (Week 1): Pt will vocalize/phonation x 1 given Max A. - Produced involuntary sigh x 1 and laugh x 1. No voluntary vocalization/phonation despite Max multimodal A to include tactile cues and MIT techniques. ? ?SLP Short Term Goal 5 (Week 1): Pt will utilize multimodal communication methods to communicate wants, needs, thoughts, and ideas x 5 with Max A. - x 3 with Max multimodal A. ? ?Skilled Therapeutic Interventions: ?Pt seen this date for skilled ST intervention targeting communication goals outlined above. Pt received awake/alert and sitting upright in bed. Agreeable to ST intervention via head nod.  ? ?SLP facilitated today's session by providing the following ST interventions during functional and therapeutic tx tasks: supported communication interventions (written binary choice prompts, gesture use, simple language), Max multimodal A, MIT interventions, and tactile cueing + model  prompts/demonstration for motor planning/execution.  ? ?Please see above for objective data collected during today's session. Benefited from [place] A for encouraging fluids due to diminished motor apraxia and initiation. ? ?Pt left in bed, bed alarm on, call bell within reach, and all immediate needs met. Continue per ST POC.  ? ?Pain ?NAD; pt unable to reliably communicate pain. ? ?Therapy/Group: Individual Therapy ? ?Sheppard Luckenbach A Brittie Whisnant ?06/17/2021, 12:35 PM ?

## 2021-06-17 NOTE — Progress Notes (Signed)
Occupational Therapy Session Note ? ?Patient Details  ?Name: Henry Simmons ?MRN: [id number] ?Date of Birth: 13-Aug-1970 ? ?Today's Date: 06/17/2021 ?OT Individual Time: 3009-2330 ?OT Individual Time Calculation (min): 69 min  ? ? ?Short Term Goals: ?Week 1:  OT Short Term Goal 1 (Week 1): Pt will complete ADL task EOB with no more than min A for balance. ?OT Short Term Goal 2 (Week 1): Pt will don shirt in supported sitting with min A. ?OT Short Term Goal 3 (Week 1): Pt will complete grooming task with no more than min A. ?OT Short Term Goal 4 (Week 1): Pt will complete toilet transfer with max A and LRAD. ? ?Skilled Therapeutic Interventions/Progress Updates:  ?  Pt received semi-reclined in bed, leaning to his R, no s/sx pain, agreeable to therapy. Session focus on self-care retraining, activity tolerance, transfer retraining, midline orientation, RUE/RLE WB in prep for improved ADL/IADL/func mobility performance + decreased caregiver burden. Came to sitting EOB with mod A to progress hemi body and to lift trunk. Total A to don pants with use of stedy and mod A for balance and to thread RUE into shirt this date. Stedy transfer > w/c with mod A to power up , mod to max to maintain static balance due to increasing pushing with LUE. Total A w/c transport > gym.  ? ?Squat-pivot to his L with mod A and plus 2 present for safety / to stabilize w/c, pt does tend to come up into standing despite cues to squat. Squat-pivot back to his R with heavy max A of 2 due to increased pushing.  ? ?Blocked practice of static standing balance in stedy to target midline orientation and RUE/RLE WB, improved midline and visual attention with use of mirror for visual feedback. Does require max A to prevent R LOB when LUE engaged in activity in standing, min A for balance when in perched position. Pt engaged in placing/removing various items from L > R to force R visual attention. Required hand over hand assist at times due to LUE apraxia with  how to manipulate horse shoes/bean bags. ? ?Pt left seated in w/c with safety belt alarm engaged, call bell in reach, and all immediate needs met.  ? ? ?Therapy Documentation ?Precautions:  ?Precautions ?Precaution Comments: R hemiparesis, apraxic, global aphasia, diplopia ?Restrictions ?Weight Bearing Restrictions: No ? ?Pain: no s/sx throughout ?  ?ADL: See Care Tool for more details. ? ? ?Therapy/Group: Individual Therapy ? ?Volanda Napoleon MS, OTR/L ? ?06/17/2021, 6:58 AM ?

## 2021-06-18 DIAGNOSIS — G8191 Hemiplegia, unspecified affecting right dominant side: Secondary | ICD-10-CM | POA: Diagnosis not present

## 2021-06-18 DIAGNOSIS — I639 Cerebral infarction, unspecified: Secondary | ICD-10-CM | POA: Diagnosis not present

## 2021-06-18 DIAGNOSIS — R4701 Aphasia: Secondary | ICD-10-CM

## 2021-06-18 DIAGNOSIS — R1311 Dysphagia, oral phase: Secondary | ICD-10-CM | POA: Diagnosis not present

## 2021-06-18 DIAGNOSIS — I63512 Cerebral infarction due to unspecified occlusion or stenosis of left middle cerebral artery: Secondary | ICD-10-CM | POA: Diagnosis not present

## 2021-06-18 MED ORDER — LIP MEDEX EX OINT
TOPICAL_OINTMENT | CUTANEOUS | Status: DC | PRN
  Filled 2021-06-18: qty 7

## 2021-06-18 MED ORDER — PANTOPRAZOLE 2 MG/ML SUSPENSION
40.0000 mg | Freq: Every day | ORAL | Status: DC
  Administered 2021-06-18 – 2021-06-19 (×2): 40 mg
  Filled 2021-06-18: qty 20

## 2021-06-18 MED ORDER — PANTOPRAZOLE SODIUM 20 MG PO TBEC: 40.0000 mg | DELAYED_RELEASE_TABLET | Freq: Every day | ORAL | Status: DC

## 2021-06-18 NOTE — Progress Notes (Signed)
Physical Therapy Session Note ? ?Patient Details  ?Name: Henry Simmons ?MRN: [id number] ?Date of Birth: 1970/11/23 ? ?Today's Date: 06/18/2021 ?PT Individual Time: 1102-1200 ?PT Individual Time Calculation (min): 58 min  ? ?Short Term Goals: ?Week 1:  PT Short Term Goal 1 (Week 1): pt will transfer to [facility] with mod assist ?PT Short Term Goal 2 (Week 1): Pt will ambulate 23f with max assist and LRAD ?PT Short Term Goal 3 (Week 1): Pt pt will propell WC 1041fwith min assist ?Week 2:    ? ?Skilled Therapeutic Interventions/Progress Updates:  ?Session 1  ?Pt received sitting in WC and agreeable to PT ? ?Stedy transfer to toilet with stedy mod-max assist for balance. Pt abl eto void bladder and small BM sitting on toilet. [facility] mod- max assist for safety. Pt performed sit<>stand in paralell bars with mod and max assist to then performed stepping the LLE to target with RLE blocked into extension 2 x 5 with cues for posture and trunkal alignment. No noted activation of RLE throughout ? ?Gait training at rail in hall 1547f 2 and then forward reverse 12f59f with max assist +2 for WC follow. PT required to advance the RLE on first 3 bouts of gait training but noted to have intermittent activation of R hip flexors with forward gait and hip/knee extension in forward and retrogait on 4th bout. But assist continued to be required to prevent buckling and upright posture.  ? ?Pt returned to room and performed squat pivot transfer to bed with max assist to the R with RLE blocked. Sit>supine completed with mod assist on the R side, and left supine in bed with call bell in reach and all needs met.  ? ?Session 2.  ? ?Pt received supine in bed and agreeable to PT. Supine>sit transfer with max assist on the R side of the bed with cues for safety as pt performed through long sitting. Squat/stand pivot to the [facility] with max assist to block the RLE. Trace hip extension noted. Pt transported orthogym. PT  treatment focused on postural control and visual scanning on BITS: user paced 2 min x 2 with LEU and x 2 min on the RUE cues initially for scanning the R with LUE. Max-total A for use of RUE with max cues for attempting to use RUE. Performed sequence with 10 for mubers, unable to properly identify any number reduced to4 stimuli with 50% accuracy. Patient returned to room and [facility] reach and all needs met.   ? ? ?   ? ?Therapy Documentation ?Precautions:  ?Precautions ?Precaution Comments: R hemiparesis, apraxic, global aphasia, diplopia ?Restrictions ?Weight Bearing Restrictions: No ? ?  ?Pain: ?Session 1  ?Pain Assessment ?Pain Scale: Faces ?Faces Pain Scale: No hurt  ?Session 2 ?Faces: no hurt  ? ? ? ?Therapy/Group: Individual Therapy ? ?AustLorie Phenix6/2023, 12:49 PM  ?

## 2021-06-18 NOTE — Progress Notes (Signed)
Speech Language Pathology Daily Session Note ? ?Patient Details  ?Name: Henry Simmons ?MRN: [id number] ?Date of Birth: [birth year] ? ?Today's Date: 06/18/2021 ?SLP Individual Time: [phone] ?SLP Individual Time Calculation (min): 20 min and Today's Date: 06/18/2021 ?SLP Missed Time: 25 Minutes ?Missed Time Reason: Nursing care ? ?Short Term Goals: ?Week 1: SLP Short Term Goal 1 (Week 1): Pt will answer basic yes/no questions with 50% accuracy via multimodal means with Max A ?SLP Short Term Goal 2 (Week 1): Pt will follow one-step commands within context of functional task on 50% of trials given Max A. ?SLP Short Term Goal 3 (Week 1): Pt will complete receptive ID of common objects and pictures with 80% accuracy given Min A. ?SLP Short Term Goal 4 (Week 1): Pt will vocalize/phonation x 1 given Max A. ?SLP Short Term Goal 5 (Week 1): Pt will utilize multimodal communication methods to communicate wants, needs, thoughts, and ideas x 5 with Max A. ? ?Skilled Therapeutic Interventions: ?Pt seen for skilled ST with focus on swallowing and communication goals, initially missing 25 minutes of treatment 2' nursing care. Spouse leaving as ST entered and pt desiring to eat AM meal. Pt motivated for self feeding this date, did require set up and Min A for utensil management and bolus size control on spoon/fork. Pt requiring initial Total A to slow rate of intake and alternate solids and liquids to clear moderately severe diffuse oral stasis. This faded to Mod A as meal continued. Pt demonstrates limited awareness of build up of reside in oral cavity, continue to recommend full supervision. Pt with no overt s/s aspiration with 75% Dys 2/thin AM meal, noted loud audible swallow with thin liquids via straw. SLP facilitating communication of basic wants/needs by providing choices written on white board vertically in field of 2. Pt continues with "yes" bias to questions, less emphatic "yes" coupled with hand waving appears to indicate  "NO" response this date. Pt tray removed when finished and pt left in bed with alarm set and all needs met and within reach. Cont ST POC.  ? ?Pain ?Pain Assessment ?Pain Scale: Faces ?Faces Pain Scale: No hurt ? ?Therapy/Group: Individual Therapy ? ?Dewaine Conger ?06/18/2021, 9:46 AM ?

## 2021-06-18 NOTE — Progress Notes (Signed)
?                                                       PROGRESS NOTE ? ? ?Subjective/Complaints: ? ?Pt with slp eating breakfast. Having trouble with pocketing but does well with pharyngeal phase of swallowing.  ? ?ROS: limited due to language/communication  ? ?Objective: ?  ?No results found. ?No results for input(s): WBC, HGB, HCT, PLT in the last 72 hours. ?No results for input(s): NA, K, CL, CO2, GLUCOSE, BUN, CREATININE, CALCIUM in the last 72 hours. ? ?Intake/Output Summary (Last 24 hours) at 06/18/2021 1125 ?Last data filed at 06/18/2021 0630 ?Gross per 24 hour  ?Intake 1437 ml  ?Output 1000 ml  ?Net 437 ml  ?  ? ?  ? ?Physical Exam: ?Vital Signs ?Blood pressure 130/66, pulse 74, temperature 98.5 ?F (36.9 ?C), temperature source Oral, resp. rate 17, height 6\' 2"  (1.88 m), weight 79.6 kg, SpO2 100 %. ? ? ?Constitutional: No distress . Vital signs reviewed. ?HEENT: NCAT, EOMI, oral membranes moist ?Neck: supple ?Cardiovascular: RRR without murmur. No JVD    ?Respiratory/Chest: CTA Bilaterally without wheezes or rales. Normal effort    ?GI/Abdomen: BS +, non-tender, non-distended ?Ext: no clubbing, cyanosis, or edema ?Psych: pleasant and cooperative  ?Skin: No evidence of breakdown, no evidence of rash ?Neurologic: Cranial nerves II through XII intact, motor strength is 5/5 in left and 0/5 right deltoid, bicep, tricep, grip, hip flexor, knee extensors, ankle dorsiflexor and plantar flexor ?Sensory exam cannot assess due to aphasia  ? ?Musculoskeletal: No pain with RUE or LE PROM or LUE, LLE AROM  ? ? ?Assessment/Plan: ?1. Functional deficits which require 3+ hours per day of interdisciplinary therapy in a comprehensive inpatient rehab setting. ?Physiatrist is providing close team supervision and 24 hour management of active medical problems listed below. ?Physiatrist and rehab team continue to assess barriers to discharge/monitor patient progress toward functional and medical goals ? ?Care Tool: ? ?Bathing ?    ?Body parts bathed by patient: Face  ?   ?  ?  ?Bathing assist Assist Level: Moderate Assistance - Patient 50 - 74% ?  ?  ?Upper Body Dressing/Undressing ?Upper body dressing   ?What is the patient wearing?: Pull over shirt ?   ?Upper body assist Assist Level: Maximal Assistance - Patient 25 - 49% ?   ?Lower Body Dressing/Undressing ?Lower body dressing ? ? ?   ?What is the patient wearing?: Pants ? ?  ? ?Lower body assist Assist for lower body dressing: Total Assistance - Patient < 25% ?   ? ?Toileting ?Toileting    ?Toileting assist Assist for toileting: Moderate Assistance - Patient 50 - 74% ?  ?  ?Transfers ?Chair/bed transfer ? ?Transfers assist ?   ? ?Chair/bed transfer assist level: Maximal Assistance - Patient 25 - 49% ?  ?  ?Locomotion ?Ambulation ? ? ?Ambulation assist ? ? Ambulation activity did not occur: Safety/medical concerns ? ?  ?  ?   ? ?Walk 10 feet activity ? ? ?Assist ? Walk 10 feet activity did not occur: Safety/medical concerns ? ?  ?   ? ?Walk 50 feet activity ? ? ?Assist Walk 50 feet with 2 turns activity did not occur: Safety/medical concerns ? ?  ?   ? ? ?Walk 150 feet activity ? ? ?Assist Walk  150 feet activity did not occur: Safety/medical concerns ? ?  ?  ?  ? ?Walk 10 feet on uneven surface  ?activity ? ? ?Assist Walk 10 feet on uneven surfaces activity did not occur: Safety/medical concerns ? ? ?  ?   ? ?Wheelchair ? ? ? ? ?Assist Is the patient using a wheelchair?: Yes ?Type of Wheelchair: Manual ?  ? ?Wheelchair assist level: Maximal Assistance - Patient 25 - 49% ?Max wheelchair distance: 150  ? ? ?Wheelchair 50 feet with 2 turns activity ? ? ? ?Assist ? ?  ?  ? ? ?Assist Level: Maximal Assistance - Patient 25 - 49%  ? ?Wheelchair 150 feet activity  ? ? ? ?Assist ?   ? ? ?Assist Level: Maximal Assistance - Patient 25 - 49%  ? ?Blood pressure 130/66, pulse 74, temperature 98.5 ?F (36.9 ?C), temperature source Oral, resp. rate 17, height 6\' 2"  (1.88 m), weight 79.6 kg, SpO2 100  %. ? ?Medical Problem List and Plan: ?1. 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            -patient may  shower ?            -ELOS/Goals: 14-20 days min A for SLP,PT and OT ? -Continue CIR therapies including PT, OT, and SLP  ?2.  Antithrombotics: ?-DVT/anticoagulation:  Pharmaceutical: Lovenox ?            -antiplatelet therapy: Aspirin 81 mg daily and Plavix 75 mg daily ?3. Pain Management: Tylenol as needed ?4. Mood: Provide emotional support ?            -antipsychotic agents: N/A ?5. Neuropsych: This patient is not capable of making decisions on his own behalf. ?6. Skin/Wound Care: Routine skin checks ?7. Fluids/Electrolytes/Nutrition: Routine in and outs with follow-up chemistries ?8.  CAD/LAD stent/non-STEMI.  Continue aspirin and Plavix.  Follow-up per cardiology services ?9.  Hypertrophic cardiomyopathy/bradycardia.  Initially with temporary pacer.  No current plan for permanent pacemaker.  Continue Jardiance 10 mg daily. ?10.  Hypertension.  Norvasc 2.5 mg daily, hydralazine 10 mg twice daily, Isordil 10 mg twice daily,Lopressor 12.5 mg BID, Cozaar 100 mg daily. Monitor with increased mobility ?Vitals:  ? 06/18/21 0522 06/18/21 0837  ?BP: 126/69 130/66  ?Pulse: 76 74  ?Resp: 17   ?Temp: 98.5 ?F (36.9 ?C)   ?SpO2: 100%   ?Given intracranial stenosis would allow BP to run upper normal 130-140s, will decrease Cozaar to 25mg  starting 5/6 ?5/6 bp's in range  ? ?11.  CKD stage III.  Follow-up chemistries- ? ?  Latest Ref Rng & Units 06/15/2021  ?  5:23 AM 06/14/2021  ?  6:57 PM 06/14/2021  ?  1:07 AM  ?BMP  ?Glucose 70 - 99 mg/dL 115    116    ?BUN 6 - 20 mg/dL 35    28    ?Creatinine 0.61 - 1.24 mg/dL 1.37   1.39   1.29    ?Sodium 135 - 145 mmol/L 138    136    ?Potassium 3.5 - 5.1 mmol/L 4.4    4.3    ?Chloride 98 - 111 mmol/L 104    100    ?CO2 22 - 32 mmol/L 25    24    ?Calcium 8.9 - 10.3  mg/dL 9.6    9.6    ? ?BUN/Creat creeping up but GFR still normal ,  reducing Cozaar should help , enc po fluid  ?-if intake < 1083ml needs IV overnite  ?1200cc in 5/5 ?12.  Hyperlipidemia.  Lipitor ? 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 -primarily oral ? -D2 thin diet ? -has good appetite ? ?LOS: ?4 days ?A FACE TO FACE EVALUATION WAS PERFORMED ? ?Meredith Staggers ?06/18/2021, 11:25 AM  ? ? ? ?

## 2021-06-18 NOTE — Progress Notes (Signed)
Occupational Therapy Session Note ? ?Patient Details  ?Name: Henry Simmons ?MRN: [id number] ?Date of Birth: 08-19-1970 ? ?Today's Date: 06/18/2021 ?OT Individual Time: 5374-8270 ?OT Individual Time Calculation (min): 55 min  ? ? ?Short Term Goals: ?Week 1:  OT Short Term Goal 1 (Week 1): Pt will complete ADL task EOB with no more than min A for balance. ?OT Short Term Goal 2 (Week 1): Pt will don shirt in supported sitting with min A. ?OT Short Term Goal 3 (Week 1): Pt will complete grooming task with no more than min A. ?OT Short Term Goal 4 (Week 1): Pt will complete toilet transfer with max A and LRAD. ? ?Skilled Therapeutic Interventions/Progress Updates:  ?  Pt received semi-reclined in bed, no s/sx pain and appears agreeable to therapy. Session focus on self-care retraining, activity tolerance, RUE NMR, R visual scanning in prep for improved ADL/IADL/func mobility performance + decreased caregiver burden. Came to sitting EOB on his R with mod A to progress RLE off bed and to lift trunk. Several LOB to his R but pt able to recover with max A. Squat-pivot to his L with heavier min A to fully pivot hips and to manage hemi body. Completed oral care seated at sink with close S for thoroughness and termination of task. ? ?Total A W/C transport to and from gym. ? ?Pt completed the following games on BITS to target R visual scanning and trunk control. ? ?Game:Single Target ?Accuracy: 58.33% Reaction Time: 8.42 Duration Time: 3 Hits: 21 ?Accuracy: 90% Reaction Time:  6.26  Duration Time: 2 Hits: 18 / improved performance with increased circle size, intermittent cuing and assist to hit targets due to dysmetria ? Vs blurred vision vs apraxia? But overall able to locate all targets on R himself. ?Attempted Bell Cancellation task, but pt continued to hit incorrect targets despite max multimodal cuing, needed step by step visual cues to hit bells. ? ? ?Pt participated in massed practiced of forward slides with use of BZ board,  chest press, and B shoulder flexion with use of 1 lb dowel rod and ace wrap to facilitate R grip. Needed hand over hand assistance to appropriately complete therex and cues to attend to RUE throughout. ? ?Pt left seated in w/c with safety belt alarm engaged, call bell in reach, and all immediate needs met.  ? ? ?Therapy Documentation ?Precautions:  ?Precautions ?Precaution Comments: R hemiparesis, apraxic, global aphasia, diplopia ?Restrictions ?Weight Bearing Restrictions: No ? ?Pain: no s/sx throughout ?ADL: See Care Tool for more details. ? ? ?Therapy/Group: Individual Therapy ? ?Volanda Napoleon MS, OTR/L ? ?06/18/2021, 7:02 AM ?

## 2021-06-19 DIAGNOSIS — R1311 Dysphagia, oral phase: Secondary | ICD-10-CM | POA: Diagnosis not present

## 2021-06-19 DIAGNOSIS — G8191 Hemiplegia, unspecified affecting right dominant side: Secondary | ICD-10-CM | POA: Diagnosis not present

## 2021-06-19 DIAGNOSIS — I639 Cerebral infarction, unspecified: Secondary | ICD-10-CM | POA: Diagnosis not present

## 2021-06-19 DIAGNOSIS — I63512 Cerebral infarction due to unspecified occlusion or stenosis of left middle cerebral artery: Secondary | ICD-10-CM | POA: Diagnosis not present

## 2021-06-19 NOTE — Progress Notes (Signed)
?                                                       PROGRESS NOTE ? ? ?Subjective/Complaints: ? ?Pt without new complaints. Slept well. Appears comfortable ? ?ROS: limited due to language/communication  ? ? ?Objective: ?  ?No results found. ?No results for input(s): WBC, HGB, HCT, PLT in the last 72 hours. ?No results for input(s): NA, K, CL, CO2, GLUCOSE, BUN, CREATININE, CALCIUM in the last 72 hours. ? ?Intake/Output Summary (Last 24 hours) at 06/19/2021 1049 ?Last data filed at 06/19/2021 1000 ?Gross per 24 hour  ?Intake 1303 ml  ?Output 350 ml  ?Net 953 ml  ?  ? ?  ? ?Physical Exam: ?Vital Signs ?Blood pressure 126/80, pulse 96, temperature 98.5 ?F (36.9 ?C), resp. rate 18, height 6\' 2"  (123XX123 m), weight 79.6 kg, SpO2 100 %. ? ? ?Constitutional: No distress . Vital signs reviewed. ?HEENT: NCAT, EOMI, oral membranes moist ?Neck: supple ?Cardiovascular: RRR without murmur. No JVD    ?Respiratory/Chest: CTA Bilaterally without wheezes or rales. Normal effort    ?GI/Abdomen: BS +, non-tender, non-distended ?Ext: no clubbing, cyanosis, or edema ?Psych: pleasant and cooperative  ?Skin: No evidence of breakdown, no evidence of rash ?Neurologic: globally aphasic.  ?Cranial nerves II through XII intact, motor strength is 5/5 in left and 0/5 right deltoid, bicep, tricep, grip, hip flexor, knee extensors, ankle dorsiflexor and plantar flexor ?Sensory exam cannot assess due to aphasia  ? ?Musculoskeletal: No pain with RUE or LE PROM or LUE, LLE AROM  ? ? ?Assessment/Plan: ?1. Functional deficits which require 3+ hours per day of interdisciplinary therapy in a comprehensive inpatient rehab setting. ?Physiatrist is providing close team supervision and 24 hour management of active medical problems listed below. ?Physiatrist and rehab team continue to assess barriers to discharge/monitor patient progress toward functional and medical goals ? ?Care Tool: ? ?Bathing ?   ?Body parts bathed by patient: Face  ?   ?  ?  ?Bathing  assist Assist Level: Moderate Assistance - Patient 50 - 74% ?  ?  ?Upper Body Dressing/Undressing ?Upper body dressing   ?What is the patient wearing?: Pull over shirt ?   ?Upper body assist Assist Level: Maximal Assistance - Patient 25 - 49% ?   ?Lower Body Dressing/Undressing ?Lower body dressing ? ? ?   ?What is the patient wearing?: Pants ? ?  ? ?Lower body assist Assist for lower body dressing: Total Assistance - Patient < 25% ?   ? ?Toileting ?Toileting    ?Toileting assist Assist for toileting: Moderate Assistance - Patient 50 - 74% ?  ?  ?Transfers ?Chair/bed transfer ? ?Transfers assist ?   ? ?Chair/bed transfer assist level: Maximal Assistance - Patient 25 - 49% ?  ?  ?Locomotion ?Ambulation ? ? ?Ambulation assist ? ? Ambulation activity did not occur: Safety/medical concerns ? ?  ?  ?   ? ?Walk 10 feet activity ? ? ?Assist ? Walk 10 feet activity did not occur: Safety/medical concerns ? ?  ?   ? ?Walk 50 feet activity ? ? ?Assist Walk 50 feet with 2 turns activity did not occur: Safety/medical concerns ? ?  ?   ? ? ?Walk 150 feet activity ? ? ?Assist Walk 150 feet activity did not occur: Safety/medical concerns ? ?  ?  ?  ? ?  Walk 10 feet on uneven surface  ?activity ? ? ?Assist Walk 10 feet on uneven surfaces activity did not occur: Safety/medical concerns ? ? ?  ?   ? ?Wheelchair ? ? ? ? ?Assist Is the patient using a wheelchair?: Yes ?Type of Wheelchair: Manual ?  ? ?Wheelchair assist level: Maximal Assistance - Patient 25 - 49% ?Max wheelchair distance: 150  ? ? ?Wheelchair 50 feet with 2 turns activity ? ? ? ?Assist ? ?  ?  ? ? ?Assist Level: Maximal Assistance - Patient 25 - 49%  ? ?Wheelchair 150 feet activity  ? ? ? ?Assist ?   ? ? ?Assist Level: Maximal Assistance - Patient 25 - 49%  ? ?Blood pressure 126/80, pulse 96, temperature 98.5 ?F (36.9 ?C), resp. rate 18, height 6\' 2"  (1.88 m), weight 79.6 kg, SpO2 100 %. ? ?Medical Problem List and Plan: ?1. 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            -patient may  shower ?            -ELOS/Goals: 14-20 days min A for SLP,PT and OT ? -Continue CIR therapies including PT, OT, and SLP  ?2.  Antithrombotics: ?-DVT/anticoagulation:  Pharmaceutical: Lovenox ?            -antiplatelet therapy: Aspirin 81 mg daily and Plavix 75 mg daily ?3. Pain Management: Tylenol as needed ?4. Mood: Provide emotional support ?            -antipsychotic agents: N/A ?5. Neuropsych: This patient is not capable of making decisions on his own behalf. ?6. Skin/Wound Care: Routine skin checks ?7. Fluids/Electrolytes/Nutrition: Routine in and outs with follow-up chemistries ?8.  CAD/LAD stent/non-STEMI.  Continue aspirin and Plavix.  Follow-up per cardiology services ?9.  Hypertrophic cardiomyopathy/bradycardia.  Initially with temporary pacer.  No current plan for permanent pacemaker.  Continue Jardiance 10 mg daily. ?10.  Hypertension.  Norvasc 2.5 mg daily, hydralazine 10 mg twice daily, Isordil 10 mg twice daily,Lopressor 12.5 mg BID, Cozaar 100 mg daily. Monitor with increased mobility ?Vitals:  ? 06/19/21 0447 06/19/21 0808  ?BP: 123/82 126/80  ?Pulse: 99 96  ?Resp: 18   ?Temp: 98.5 ?F (36.9 ?C)   ?SpO2: 100%   ?Given intracranial stenosis would allow BP to run upper normal 130-140s, will decrease Cozaar to 25mg  starting 5/6 ?5/7 bp's in range  ? ?11.  CKD stage III.  Follow-up chemistries- ? ?  Latest Ref Rng & Units 06/15/2021  ?  5:23 AM 06/14/2021  ?  6:57 PM 06/14/2021  ?  1:07 AM  ?BMP  ?Glucose 70 - 99 mg/dL 115    116    ?BUN 6 - 20 mg/dL 35    28    ?Creatinine 0.61 - 1.24 mg/dL 1.37   1.39   1.29    ?Sodium 135 - 145 mmol/L 138    136    ?Potassium 3.5 - 5.1 mmol/L 4.4    4.3    ?Chloride 98 - 111 mmol/L 104    100    ?CO2 22 - 32 mmol/L 25    24    ?Calcium 8.9 - 10.3 mg/dL 9.6    9.6    ? ?BUN/Creat creeping up but GFR still normal , reducing Cozaar should  help , enc po fluid  ?-if intake < 1030ml needs IV overnite  ?1400cc  on 5/6 ?12.  Hyperlipidemia.  Lipitor ? 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 -primarily oral ? -D2 thin diet ? -has good appetite ? ?LOS: ?5 days ?A FACE TO FACE EVALUATION WAS PERFORMED ? ?Meredith Staggers ?06/19/2021, 10:49 AM  ? ? ? ?

## 2021-06-20 DIAGNOSIS — I63512 Cerebral infarction due to unspecified occlusion or stenosis of left middle cerebral artery: Secondary | ICD-10-CM | POA: Diagnosis not present

## 2021-06-20 MED ORDER — LOSARTAN POTASSIUM 25 MG PO TABS
12.5000 mg | ORAL_TABLET | Freq: Every day | ORAL | Status: DC
  Administered 2021-06-21 – 2021-06-27 (×6): 12.5 mg via ORAL
  Filled 2021-06-20 (×8): qty 0.5

## 2021-06-20 MED ORDER — PANTOPRAZOLE SODIUM 40 MG PO TBEC
40.0000 mg | DELAYED_RELEASE_TABLET | Freq: Every day | ORAL | Status: DC
  Administered 2021-06-20 – 2021-07-07 (×18): 40 mg via ORAL
  Filled 2021-06-20 (×18): qty 1

## 2021-06-20 NOTE — Progress Notes (Signed)
Occupational Therapy Session Note ? ?Patient Details  ?Name: Henry Simmons ?MRN: [id number] ?Date of Birth: [birth year] ? ?Today's Date: 06/20/2021 ?OT Individual Time: 4656-8127 ?OT Individual Time Calculation (min): 72 min  ? ? ?Short Term Goals: ?Week 1:  OT Short Term Goal 1 (Week 1): Pt will complete ADL task EOB with no more than min A for balance. ?OT Short Term Goal 2 (Week 1): Pt will don shirt in supported sitting with min A. ?OT Short Term Goal 3 (Week 1): Pt will complete grooming task with no more than min A. ?OT Short Term Goal 4 (Week 1): Pt will complete toilet transfer with max A and LRAD. ? ?Skilled Therapeutic Interventions/Progress Updates:  ?  Patient received supine awake, reclined supine in bed.  Patient agreeable to getting up out of bed for therapy session.  Patient required moderate assistance to transition from supine to sitting (rolling to left)  Patient falling to right and posterior when upright - with facilitation of BLE feet on floor and weight even between two hips - able to sit upright at edge of bed.  Patient able to squat pivot transfer to wheelchair with multiple scoots and mod/max assistance.  Patient with tendency to move very quickly thus losing balance, much safer performance when slowed.   ?Transitioned to sink, patient perseverative on washing face.  With physical and verbal cue able to move to next body part.  Step by step directives needed.  Patient following 75% simple verbal/gestural cues in familiar functional task.  Patient apraxic, and inattentive to RUE - Donning pull over shirt challenging, and patient unable to recognize errors.  If assisted RUE into shirt - able to continue with cueing.   ?Worked on sit to stand with alignment and facilitation of RLE, using foot of bed for support.  After initial trials, able to transition from sitting to standing with min assist, and complete subtle L/R weight shifts,  and even take LUE out of support as he needs to do for ADL.   Wife, [name], arrived midway through session, and was encouraging to patient.  Set patient up for breakfast, and patient had difficulty orienting cup to mouth.  Did best with assistance for initiation of movement - then he could continue - same with fork.   ? ?Therapy Documentation ?Precautions:  ?Precautions ?Precaution Comments: R hemiparesis, apraxic, global aphasia, diplopia ?Restrictions ?Weight Bearing Restrictions: No ?  ?  ?Pain: ?Pain Assessment ?Pain Scale: 0-10 ?Pain Score: 0-No pain ? ? ? ? ?Therapy/Group: Individual Therapy ? ?[name] ?06/20/2021, 12:21 PM ?

## 2021-06-20 NOTE — Progress Notes (Addendum)
?                                                       PROGRESS NOTE ? ? ?Subjective/Complaints: ? ?No issues overnite , discussed rehab process with wife and need for 24/7 sup post d/c ?Wife is CNA ? ?ROS: limited due to language/communication  ? ? ?Objective: ?  ?No results found. ?No results for input(s): WBC, HGB, HCT, PLT in the last 72 hours. ?No results for input(s): NA, K, CL, CO2, GLUCOSE, BUN, CREATININE, CALCIUM in the last 72 hours. ? ?Intake/Output Summary (Last 24 hours) at 06/20/2021 0849 ?Last data filed at 06/20/2021 0630 ?Gross per 24 hour  ?Intake 1419 ml  ?Output 350 ml  ?Net 1069 ml  ? ?  ? ?  ? ?Physical Exam: ?Vital Signs ?Blood pressure 115/88, pulse 80, temperature 98.3 ?F (36.8 ?C), temperature source Oral, resp. rate 16, height 6\' 2"  (1.88 m), weight 79.6 kg, SpO2 99 %. ? ? ? ?General: No acute distress ?Mood and affect are appropriate ?Heart: Regular rate and rhythm no rubs murmurs or extra sounds ?Lungs: Clear to auscultation, breathing unlabored, no rales or wheezes ?Abdomen: Positive bowel sounds, soft nontender to palpation, nondistended ?Extremities: No clubbing, cyanosis, or edema ?Skin: No evidence of breakdown, no evidence of rash ? ?Neurologic: globally aphasic.  ?Cranial nerves II through XII intact, motor strength is 5/5 in left and 0/5 right deltoid, bicep, tricep, grip, hip flexor, knee extensors, ankle dorsiflexor and plantar flexor ?Sensory exam cannot assess due to aphasia  ? ?Musculoskeletal: No pain with RUE or LE PROM or LUE, LLE AROM  ? ? ?Assessment/Plan: ?1. Functional deficits which require 3+ hours per day of interdisciplinary therapy in a comprehensive inpatient rehab setting. ?Physiatrist is providing close team supervision and 24 hour management of active medical problems listed below. ?Physiatrist and rehab team continue to assess barriers to discharge/monitor patient progress toward functional and medical goals ? ?Care Tool: ? ?Bathing ?   ?Body parts bathed by  patient: Face  ?   ?  ?  ?Bathing assist Assist Level: Moderate Assistance - Patient 50 - 74% ?  ?  ?Upper Body Dressing/Undressing ?Upper body dressing   ?What is the patient wearing?: Pull over shirt ?   ?Upper body assist Assist Level: Maximal Assistance - Patient 25 - 49% ?   ?Lower Body Dressing/Undressing ?Lower body dressing ? ? ?   ?What is the patient wearing?: Pants ? ?  ? ?Lower body assist Assist for lower body dressing: Total Assistance - Patient < 25% ?   ? ?Toileting ?Toileting    ?Toileting assist Assist for toileting: Moderate Assistance - Patient 50 - 74% ?  ?  ?Transfers ?Chair/bed transfer ? ?Transfers assist ?   ? ?Chair/bed transfer assist level: Maximal Assistance - Patient 25 - 49% ?  ?  ?Locomotion ?Ambulation ? ? ?Ambulation assist ? ? Ambulation activity did not occur: Safety/medical concerns ? ?  ?  ?   ? ?Walk 10 feet activity ? ? ?Assist ? Walk 10 feet activity did not occur: Safety/medical concerns ? ?  ?   ? ?Walk 50 feet activity ? ? ?Assist Walk 50 feet with 2 turns activity did not occur: Safety/medical concerns ? ?  ?   ? ? ?Walk 150 feet activity ? ? ?Assist  Walk 150 feet activity did not occur: Safety/medical concerns ? ?  ?  ?  ? ?Walk 10 feet on uneven surface  ?activity ? ? ?Assist Walk 10 feet on uneven surfaces activity did not occur: Safety/medical concerns ? ? ?  ?   ? ?Wheelchair ? ? ? ? ?Assist Is the patient using a wheelchair?: Yes ?Type of Wheelchair: Manual ?  ? ?Wheelchair assist level: Maximal Assistance - Patient 25 - 49% ?Max wheelchair distance: 150  ? ? ?Wheelchair 50 feet with 2 turns activity ? ? ? ?Assist ? ?  ?  ? ? ?Assist Level: Maximal Assistance - Patient 25 - 49%  ? ?Wheelchair 150 feet activity  ? ? ? ?Assist ?   ? ? ?Assist Level: Maximal Assistance - Patient 25 - 49%  ? ?Blood pressure 115/88, pulse 80, temperature 98.3 ?F (36.8 ?C), temperature source Oral, resp. rate 16, height 6\' 2"  (1.88 m), weight 79.6 kg, SpO2 99 %. ? ?Medical Problem List  and Plan: ?1. 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            -patient may  shower ?            -ELOS/Goals: 14-20 days min A for SLP,PT and OT ? -Continue CIR therapies including PT, OT, and SLP  ?2.  Antithrombotics: ?-DVT/anticoagulation:  Pharmaceutical: Lovenox ?            -antiplatelet therapy: Aspirin 81 mg daily and Plavix 75 mg daily ?3. Pain Management: Tylenol as needed ?4. Mood: Provide emotional support ?            -antipsychotic agents: N/A ?5. Neuropsych: This patient is not capable of making decisions on his own behalf. ?6. Skin/Wound Care: Routine skin checks ?7. Fluids/Electrolytes/Nutrition: Routine in and outs with follow-up chemistries ?8.  CAD/LAD stent/non-STEMI.  Continue aspirin and Plavix.  Follow-up per cardiology services ?9.  Hypertrophic cardiomyopathy/bradycardia.  Initially with temporary pacer.  No current plan for permanent pacemaker.  Continue Jardiance 10 mg daily. ?10.  Hypertension.  Norvasc 2.5 mg daily, hydralazine 10 mg twice daily, Isordil 10 mg twice daily,Lopressor 12.5 mg BID, Cozaar 100 mg daily. Monitor with increased mobility ?Vitals:  ? 06/19/21 2002 06/20/21 0542  ?BP: 121/73 115/88  ?Pulse: 79 80  ?Resp: 15 16  ?Temp: 98.7 ?F (37.1 ?C) 98.3 ?F (36.8 ?C)  ?SpO2: 100% 99%  ?Given intracranial stenosis would allow BP to run upper normal 130-140s, will decrease Cozaar to 12.5mg  starting on 5/9 ? ? ?11.  CKD stage III.  Follow-up chemistries- ? ?  Latest Ref Rng & Units 06/15/2021  ?  5:23 AM 06/14/2021  ?  6:57 PM 06/14/2021  ?  1:07 AM  ?BMP  ?Glucose 70 - 99 mg/dL 115    116    ?BUN 6 - 20 mg/dL 35    28    ?Creatinine 0.61 - 1.24 mg/dL 1.37   1.39   1.29    ?Sodium 135 - 145 mmol/L 138    136    ?Potassium 3.5 - 5.1 mmol/L 4.4    4.3    ?Chloride 98 - 111 mmol/L 104    100    ?CO2 22 - 32 mmol/L 25    24    ?Calcium 8.9 - 10.3 mg/dL 9.6    9.6     ? ?BUN/Creat creeping up but GFR still  normal , reducing Cozaar should help , enc po fluid  ?-if intake < 1025ml needs IV overnite  ?1400-1657ml last 2 d ?12.  Hyperlipidemia.  Lipitor ? 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 -primarily oral ? -D2 thin diet ? -has good appetite ? ?LOS: ?6 days ?A FACE TO FACE EVALUATION WAS PERFORMED ? ?Luanna Salk Eliyah Bazzi ?06/20/2021, 8:49 AM  ? ? ? ?

## 2021-06-20 NOTE — Progress Notes (Signed)
Speech Language Pathology Daily Session Note ? ?Patient Details  ?Name: ADONYS WILDES ?MRN: [id number] ?Date of Birth: 03/14/1970 ? ?Today's Date: 06/20/2021 ?SLP Individual Time: 9311-2162 ?SLP Individual Time Calculation (min): 60 min ? ?Short Term Goals: ?Week 1: SLP Short Term Goal 1 (Week 1): Pt will answer basic yes/no questions with 50% accuracy via multimodal means with Max A. - Pt answered biographical + basic yes/no questions with ~50% accuracy via thumbs up for "yes" and head shake for "no" given Mod-Max A for motor initiation/planning.  PROGRESSING ? ?SLP Short Term Goal 2 (Week 1): Pt will follow one-step commands within context of functional task on 50% of trials given Max A. - Pt followed one-step commands to perform functional gestures with verb cards with 30% accuracy given Max multimodal A; 100% with Total (HOH) A. PROGRESSING ? ?SLP Short Term Goal 3 (Week 1): Pt will complete receptive ID of common objects and pictures with 80% accuracy given Min A. - Pt completed receptive of common pictures of nouns in field of 2 with 70% accuracy given Min-Mod A. 80% for verbs in field of 2 and 4 with Min A. PROGRESSING ? ?SLP Short Term Goal 4 (Week 1): Pt will vocalize/phonation x 1 given Max A. - x 0 despite Max multimodal A. NOT PROGRESSING ? ?SLP Short Term Goal 5 (Week 1): Pt will utilize multimodal communication methods to communicate wants, needs, thoughts, and ideas x 5 with Max A. - Pt communicated preferences x 3 via multimodal communication methods (written choice prompts, yes/no questions, and gestures). PROGRESSING ? ?Skilled Therapeutic Interventions: ?Pt seen this date for skilled ST intervention targeting communication goals outlined above. Pt received awake/alert and OOB in w/c. Wife present initially for today's session and left shortly after session began. Agreeable to ST intervention via head nod.  ?  ?SLP facilitated today's session by providing the following ST interventions during  functional and therapeutic tx tasks: supported communication interventions (written binary choice prompts, gesture use, establishment of yes/no system, simple language), Max multimodal A for expression of wants/needs, MIT interventions/use of melody, and and tactile cueing + model prompts/demonstration for motor planning/execution.  ?  ?Please see above for objective data collected during today's session. Initiated family education with pt's wife re: ST POC, establishment of yes/no system, apraxia/motor planning + initiation deficits, use of written choice prompts when unable to determine wants/needs/ideas, asking yes/no questions she can confirm the answers to, and current performance; she verbalized understanding, though will need reinforcement. ?  ?Pt left in w/c with safety belt donned, call bell reviewed and within reach, and all immediate needs met. Continue per ST POC.  ? ?Pain ?Pt unable to reliably report, though did not appear in any distress via non-verbal communication means. ? ?Therapy/Group: Individual Therapy ? ?Makenzee Choudhry A Don Giarrusso ?06/20/2021, 1:09 PM ?

## 2021-06-20 NOTE — Progress Notes (Signed)
Physical Therapy Session Note ? ?Patient Details  ?Name: Henry Simmons ?MRN: [id number] ?Date of Birth: Oct 30, 1970 ? ?Today's Date: 06/20/2021 ?PT Individual Time: 8768-1157 ?PT Individual Time Calculation (min): 44 min  ? ?Short Term Goals: ?Week 1:  PT Short Term Goal 1 (Week 1): pt will transfer to [facility] with mod assist ?PT Short Term Goal 2 (Week 1): Pt will ambulate 70ft with max assist and LRAD ?PT Short Term Goal 3 (Week 1): Pt pt will propell WC 142ft with min assist ? ?Skilled Therapeutic Interventions/Progress Updates:  ?Patient seated upright in w/c on entrance to room. Patient alert and agreeable to PT session.  ? ?Patient with no pain complaint throughout session. ? ?Therapeutic Activity: ?Transfers: Patient performed squat pivot transfers bed<> w/c<>mat table to L side with Min/ ModA after full explanation of expected movements in order to decrease pt's impulsivity and fast movements.  ? ?sit<>stand transfers impulsive  and requires vc prior to performance for slower movements in rise and descent to sit. Performed overall with MinA but requires Mod/ Max A for R knee block to maintain standing balance. Provided verbal cues throughout for technique. ? ? ?Neuromuscular Re-ed: ?NMR facilitated during session with focus on sitting and standing balance, midline orientation, proprioception, and muscle fiber facilitation. Pt guided in sitting and standing balance activities on EOM with mirror for visual feedback re: positioning. Up to ModA to maintain balance as pt is still inattentive to R side. Seated reaching activity with pt grabbing for rehab tech's hands/ targets. Unable to follow verbal instructions for break down of leg therex but requires tactile and visual cueing to participate. Focus on AROM for LLE and PROM/ AAROM for RLE. Consistent cueing to decrease push with placement of L hand on thigh.  ? ?Once instance of cocontraction with increased tone in HS and RLE flexing during stance. Pt with short lived  pain complaint in L thigh/ knee d/t overuse. NMR performed for improvements in motor control and coordination, balance, sequencing, judgement, and self confidence/ efficacy in performing all aspects of mobility at highest level of independence.  ? ?Patient seated upright  in w/c at end of session with brakes locked, belt alarm set, and all needs within reach. Setup for lunch arrival.  ? ? ?Therapy Documentation ?Precautions:  ?Precautions ?Precaution Comments: R hemiparesis, apraxic, global aphasia, diplopia ?Restrictions ?Weight Bearing Restrictions: No ?General: ?  ?Vital Signs: ?  ?Pain: ? One brief instance of L knee/ thigh pain during session. Unable to relate numerical rating. Address and improved with repositioning.  ? ?Therapy/Group: Individual Therapy ? ?Loel Dubonnet PT, DPT ?06/20/2021, 1:01 PM  ?

## 2021-06-21 DIAGNOSIS — I63512 Cerebral infarction due to unspecified occlusion or stenosis of left middle cerebral artery: Secondary | ICD-10-CM | POA: Diagnosis not present

## 2021-06-21 LAB — CREATININE, SERUM
Creatinine, Ser: 1.34 mg/dL — ABNORMAL HIGH (ref 0.61–1.24)
GFR, Estimated: >60 mL/min (ref >60)

## 2021-06-21 NOTE — Progress Notes (Addendum)
Speech Language Pathology Daily Session Note ? ?Patient Details  ?Name: Henry Simmons ?MRN: [id number] ?Date of Birth: 05-May-1970 ? ?Today's Date: 06/21/2021 ?SLP Individual Time: 4098-1191 ?SLP Individual Time Calculation (min): 60 min ? ?Short Term Goals: ?Week 1: SLP Short Term Goal 1 (Week 1): Pt will answer basic yes/no questions with 50% accuracy via multimodal means with Max Simmons. - Pt answered biographical + basic yes/no questions with ~30% accuracy via thumbs up for "yes" and head shake for "no" given Max Simmons for motor initiation/planning. Continues to bias "yes" response. PROGRESSING ? ?SLP Short Term Goal 2 (Week 1): Pt will follow one-step commands within context of functional task on 50% of trials given Max Simmons. - Pt followed one-step commands within therapeutic task with music/melody with ~50% accuracy given Mod visual and verbal Simmons. PROGRESSING ? ?SLP Short Term Goal 3 (Week 1): Pt will complete receptive ID of common objects and pictures with 80% accuracy given Min Simmons. - 70% accuracy given Min Simmons for common pictures of nouns (body parts and clothing); 80% with common pictures of nouns (body parts) in field of 3 with Min Simmons for visual attention to R field. PROGRESSING ? ?SLP Short Term Goal 4 (Week 1): Pt will vocalize/phonation x 1 given Max Simmons. ?x 1 - stated "wife" when asked who was with him this morning. PROGRESSING ? ?SLP Short Term Goal 5 (Week 1): Pt will utilize multimodal communication methods to communicate wants, needs, thoughts, and ideas x 5 with Max Simmons. - Pt communicated preferences x 5 via multimodal communication methods (written choice prompts + verbalization of written words and head and hand gestures) with Max Simmons. PROGRESSING ? ?SLP Short Term Goal 6 (Week 1): Will tolerate dysphagia 2 and thin liquid consistencies with Min Simmons for use of safe swallow strategies and minimal s/sx concerning for aspiration. - Pt consumed dysphagia 2 and thin liquid consistencies with Min Simmons for use of safe swallow  strategies and minimal s/sx concerning for aspiration. Only s/sx noted was audible swallow; therefore, NTL tested at bedside with similar outcome. Decrease in audible swallow with thin liquid via cup. Per chart review, VSS + "Lungs: Clear to auscultation, breathing unlabored, no rales or wheezes" (from MD note). PROGRESSING ? ?Skilled Therapeutic Interventions: ?Pt seen this date for skilled ST intervention targeting dysphagia and communication goals outlined above. Pt received awake/alert and sitting upright in bed self-administering AM meal with wife supervising and assisting as needed. Wife present initially for today's session and left shortly after session began. Agreeable to ST intervention via head nod.  ?  ?SLP facilitated today's session by providing the following ST interventions during functional and therapeutic tx tasks: supported communication interventions (written binary choice prompts, gesture use, establishment of yes/no system, simple language), Max multimodal Simmons for expression of wants/needs, MIT interventions/use of melody, tactile cueing + model prompts/demonstration for motor planning/execution, and Sup to Min Simmons for implementation of safe swallow strategies (particularly to clear oral residuals).  ? ?Remains stimulable and is demonstrating progress as evident by subtle improvement in basic auditory comprehension and use of basic yes/no system and written + verbal choice prompts for functional communication. SLP provided ongoing pt and family education re: aspiration precautions, rationale for modified diet textures, and supportive communication interventions/techniques to aid in functional communication of wants, needs, and ideas. Please see above for objective data collected during today's session.  ?  ?Pt left in bed with bed alarm on, call bell reviewed and within reach, and all immediate needs  met. Continue per ST POC.  ? ?Pain ?  ? ?Therapy/Group: Individual Therapy ? ?Henry Simmons  Henry Simmons ?06/21/2021, 9:03 AM ?

## 2021-06-21 NOTE — Progress Notes (Signed)
Occupational Therapy Session Note ? ?Patient Details  ?Name: Henry Simmons ?MRN: [id number] ?Date of Birth: 1970/07/06 ? ?Today's Date: 06/21/2021 ?OT Individual Time: 4142-3953 ?OT Individual Time Calculation (min): 68 min  ? ? ?Short Term Goals: ?Week 1:  OT Short Term Goal 1 (Week 1): Pt will complete ADL task EOB with no more than min A for balance. ?OT Short Term Goal 2 (Week 1): Pt will don shirt in supported sitting with min A. ?OT Short Term Goal 3 (Week 1): Pt will complete grooming task with no more than min A. ?OT Short Term Goal 4 (Week 1): Pt will complete toilet transfer with max A and LRAD. ? ?Skilled Therapeutic Interventions/Progress Updates:  ?  Pt received asleep in bed but easily awakened to voice, no s/sx pain and appears agreeable to therapy. Session focus on self-care retraining, activity tolerance, RUE NMR, transfer retraining  in prep for improved ADL/IADL/func mobility performance + decreased caregiver burden.   ? ?Came to sitting EOB with heavy min A to progress RLE off bed and for scooting RLE forward. Much improved static sitting balance this date. Squat-pivot to his R with max A to fully clear hips/for correct sequencing due to tendency to stand vs squat.  ? ?Doffed shirt with mod A to pull over head and to remove RUE. Bathed UB with min A to bathe LUE, total A to incorporate RUE into bathing. Pt initially with tendency to perseverate washing face/hair requiring multimodal cuing to continue to other body parts. Donned shirt with min A to thread RUE, total A to don pants but pt does assist by pulling over knees and attempting to pull up on his L. Total A to don teds and B shoes. Sit to stand with LUE on bed rail, multiple attempts and cues for safety as pt would tend to slide forward in w/c.  ? ?Massed practice of placing/removing resistive clothes pins on horizontal bar on his R, assist to facilitate RUE WB and to correct LOB. Intermittent assist to facilitate LUE pincer grasp due to  apraxia. Squat-pivot max of 2 > w/c > recliner going towards his R. ? ?Pt left seated in recliner with safety belt alarm engaged, wife present, call bell in reach, and all immediate needs met.  ? ? ?Therapy Documentation ?Precautions:  ?Precautions ?Precaution Comments: R hemiparesis, apraxic, global aphasia, diplopia ?Restrictions ?Weight Bearing Restrictions: No ? ?Pain: no s/sx throughout ?  ?ADL: See Care Tool for more details. ? ? ?Therapy/Group: Individual Therapy ? ?Volanda Napoleon MS, OTR/L ? ?06/21/2021, 6:58 AM ?

## 2021-06-21 NOTE — Progress Notes (Signed)
Physical Therapy Session Note ? ?Patient Details  ?Name: Henry Simmons ?MRN: [id number] ?Date of Birth: 11-Aug-1970 ? ?Today's Date: 06/21/2021 ?PT Individual Time: 1505-1600 ?PT Individual Time Calculation (min): 55 min  ? ?Short Term Goals: ?Week 1:  PT Short Term Goal 1 (Week 1): pt will transfer to [facility] with mod assist ?PT Short Term Goal 2 (Week 1): Pt will ambulate 37ft with max assist and LRAD ?PT Short Term Goal 3 (Week 1): Pt pt will propell WC 128ft with min assist ? ?Skilled Therapeutic Interventions/Progress Updates:  ?Patient seated with BLE elevated in recliner on entrance to room. Patient alert and agreeable to PT session. Wife present in room.  ? ?Patient with no pain complaint throughout session. ? ?Therapeutic Activity: ?Bed Mobility: Patient performed sit--> supine at end of session with Min/ ModA. Requires vc/ tc for technique to assist with bed positioning toward [facility] using RLE push and pull with RUE at headboard. ?Transfers: Patient performed squat/ stand pivot to R from recliner to w/c requiring MaxA for balance and positioning. Squat pivot transfers to L side improving to MinA although requiring max cues for correct technique and decreased impulsivity. Sit<>stand transfers also require instruction for technique prior to performance to reduce impulsivity and increase safety. Stand pivot transfer performed to R with MaxA from therapist for RLE control and MinA from +2 for balance. Wife guided at end of session in squat pivot transfer to L from w/c to bed. Demonstrated technique first and then wife able to return demo providing Min/ ModA to pt and CGA/ supervision from therapist ? ?Wheelchair Mobility:  ?Initiated wheelchair propel with use of hemitechnique. Starts with LUE only and pt very fast to push with little awareness of continued turn to R and inability to return demo vc. Requires ModA to maintain straight path for 80'. Then instructed in LE use only requiring vc/ tc and hand over foot to  instruct pt. Requires ModA to maintain for 100'. Will require continued training. Attempt to locate improved size of w/c for pt's taller size. Will need to continue to look for taller seat height and leg rests.  ? ?Neuromuscular Re-ed: ?NMR facilitated during session with focus on standing balance. Pt guided in sit <>stands with focus on technique and decreasing impulsivity and speed of rise to stand. Mod/ MaxA to maintain R knee extension with WB. Guided in lateral weight shifting for RLE joint approximation and muscle fiber facilitation. Then minisquats for continued muscle activation. Fwd/ bkwd stepping for pre-gait training. Standing at [facility] with reduced push with LUE. Minimal intermittent push noted and pt tending to release handle of walker with vc to reduce pushing. NMR performed for improvements in motor control and coordination, balance, sequencing, judgement, and self confidence/ efficacy in performing all aspects of mobility at highest level of independence.  ? ?Patient supine  in bed at end of session with brakes locked, bed alarm set, and all needs within reach. Wife and friend in room.  ? ?Therapy Documentation ?Precautions:  ?Precautions ?Precaution Comments: R hemiparesis, apraxic, global aphasia, diplopia ?Restrictions ?Weight Bearing Restrictions: No ?General: ?  ?Vital Signs: ?  ?Pain: ?No pain complaint this day. ? ?Therapy/Group: Individual Therapy ? ?Alger Simons PT, DPT ?06/21/2021, 7:05 PM  ?

## 2021-06-21 NOTE — Progress Notes (Signed)
?                                                       PROGRESS NOTE ? ? ?Subjective/Complaints: ? ?Y/N responses inaccurate , can mimic gestures but cannot do this to verbal command  ? ?ROS: limited due to language/communication  ? ? ?Objective: ?  ?No results found. ?No results for input(s): WBC, HGB, HCT, PLT in the last 72 hours. ?Recent Labs  ?  06/21/21 ?[id number]  ?CREATININE 1.34*  ? ? ?Intake/Output Summary (Last 24 hours) at 06/21/2021 0839 ?Last data filed at 06/21/2021 0740 ?Gross per 24 hour  ?Intake 1050 ml  ?Output 825 ml  ?Net 225 ml  ? ?  ? ?  ? ?Physical Exam: ?Vital Signs ?Blood pressure 133/87, pulse 73, temperature 98.3 ?F (36.8 ?C), temperature source Oral, resp. rate 17, height 6\' 2"  (1.88 m), weight 79.6 kg, SpO2 100 %. ? ? ? ?General: No acute distress ?Mood and affect are appropriate ?Heart: Regular rate and rhythm no rubs murmurs or extra sounds ?Lungs: Clear to auscultation, breathing unlabored, no rales or wheezes ?Abdomen: Positive bowel sounds, soft nontender to palpation, nondistended ?Extremities: No clubbing, cyanosis, or edema ?Skin: No evidence of breakdown, no evidence of rash ? ?Neurologic: globally aphasic.  ?Cranial nerves II through XII intact, motor strength is 5/5 in left and 0/5 right deltoid, bicep, tricep, grip, hip flexor, knee extensors, ankle dorsiflexor and plantar flexor ?Sensory exam cannot assess due to aphasia  ? ?Musculoskeletal: No pain with RUE or LE PROM or LUE, LLE AROM  ? ? ?Assessment/Plan: ?1. Functional deficits which require 3+ hours per day of interdisciplinary therapy in a comprehensive inpatient rehab setting. ?Physiatrist is providing close team supervision and 24 hour management of active medical problems listed below. ?Physiatrist and rehab team continue to assess barriers to discharge/monitor patient progress toward functional and medical goals ? ?Care Tool: ? ?Bathing ?   ?Body parts bathed by patient: Chest, Abdomen, Right arm, Right upper leg, Left  upper leg, Face  ? Body parts bathed by helper: Left arm, Right lower leg, Left lower leg, Buttocks, Front perineal area ?  ?  ?Bathing assist Assist Level: Moderate Assistance - Patient 50 - 74% ?  ?  ?Upper Body Dressing/Undressing ?Upper body dressing   ?What is the patient wearing?: Pull over shirt ?   ?Upper body assist Assist Level: Maximal Assistance - Patient 25 - 49% ?   ?Lower Body Dressing/Undressing ?Lower body dressing ? ? ?   ?What is the patient wearing?: Incontinence brief, Pants ? ?  ? ?Lower body assist Assist for lower body dressing: Maximal Assistance - Patient 25 - 49% ?   ? ?Toileting ?Toileting    ?Toileting assist Assist for toileting: Moderate Assistance - Patient 50 - 74% ?  ?  ?Transfers ?Chair/bed transfer ? ?Transfers assist ?   ? ?Chair/bed transfer assist level: Moderate Assistance - Patient 50 - 74% ?  ?  ?Locomotion ?Ambulation ? ? ?Ambulation assist ? ? Ambulation activity did not occur: Safety/medical concerns ? ?  ?  ?   ? ?Walk 10 feet activity ? ? ?Assist ? Walk 10 feet activity did not occur: Safety/medical concerns ? ?  ?   ? ?Walk 50 feet activity ? ? ?Assist Walk 50 feet with 2 turns activity did  not occur: Safety/medical concerns ? ?  ?   ? ? ?Walk 150 feet activity ? ? ?Assist Walk 150 feet activity did not occur: Safety/medical concerns ? ?  ?  ?  ? ?Walk 10 feet on uneven surface  ?activity ? ? ?Assist Walk 10 feet on uneven surfaces activity did not occur: Safety/medical concerns ? ? ?  ?   ? ?Wheelchair ? ? ? ? ?Assist Is the patient using a wheelchair?: Yes ?Type of Wheelchair: Manual ?  ? ?Wheelchair assist level: Maximal Assistance - Patient 25 - 49% ?Max wheelchair distance: 150  ? ? ?Wheelchair 50 feet with 2 turns activity ? ? ? ?Assist ? ?  ?  ? ? ?Assist Level: Maximal Assistance - Patient 25 - 49%  ? ?Wheelchair 150 feet activity  ? ? ? ?Assist ?   ? ? ?Assist Level: Maximal Assistance - Patient 25 - 49%  ? ?Blood pressure 133/87, pulse 73, temperature 98.3 ?F  (36.8 ?C), temperature source Oral, resp. rate 17, height 6\' 2"  (1.88 m), weight 79.6 kg, SpO2 100 %. ? ?Medical Problem List and Plan: ?1. 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            -patient may  shower ?            -ELOS/Goals: 14-20 days min A for SLP,PT and OT- team conf in am  ? -Continue CIR therapies including PT, OT, and SLP  ?2.  Antithrombotics: ?-DVT/anticoagulation:  Pharmaceutical: Lovenox ?            -antiplatelet therapy: Aspirin 81 mg daily and Plavix 75 mg daily ?3. Pain Management: Tylenol as needed ?4. Mood: Provide emotional support ?            -antipsychotic agents: N/A ?5. Neuropsych: This patient is not capable of making decisions on his own behalf. ?6. Skin/Wound Care: Routine skin checks ?7. Fluids/Electrolytes/Nutrition: Routine in and outs with follow-up chemistries ?8.  CAD/LAD stent/non-STEMI.  Continue aspirin and Plavix.  Follow-up per cardiology services ?9.  Hypertrophic cardiomyopathy/bradycardia.  Initially with temporary pacer.  No current plan for permanent pacemaker.  Continue Jardiance 10 mg daily. ?10.  Hypertension.  Norvasc 2.5 mg daily, hydralazine 10 mg twice daily, Isordil 10 mg twice daily,Lopressor 12.5 mg BID, Cozaar 100 mg daily. Monitor with increased mobility ?Vitals:  ? 06/20/21 2000 06/21/21 0532  ?BP: 114/82 133/87  ?Pulse: 81 73  ?Resp: 17 17  ?Temp: 98.7 ?F (37.1 ?C) 98.3 ?F (36.8 ?C)  ?SpO2: 100% 100%  ?Given intracranial stenosis would allow BP to run upper normal 130-140s, will decrease Cozaar to 12.5mg  starting on 5/9- getting in desired range  ? ? ?11.  CKD stage III.  Follow-up chemistries- ? ?  Latest Ref Rng & Units 06/21/2021  ?  5:48 AM 06/15/2021  ?  5:23 AM 06/14/2021  ?  6:57 PM  ?BMP  ?Glucose 70 - 99 mg/dL  115     ?BUN 6 - 20 mg/dL  35     ?Creatinine 0.61 - 1.24 mg/dL 1.34   1.37   1.39    ?Sodium 135 - 145 mmol/L  138      ?Potassium 3.5 - 5.1 mmol/L  4.4     ?Chloride 98 - 111 mmol/L  104     ?CO2 22 - 32 mmol/L  25     ?Calcium 8.9 -  10.3 mg/dL  9.6     ? ?BUN/Creat creeping up but GFR still normal , reducing Cozaar should help , enc po fluid  ?-if intake < 1092ml needs IV overnite  ?1400-1657ml last 2 d ?12.  Hyperlipidemia.  Lipitor ? 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 -primarily oral ? -D2 thin diet ? -has good appetite ? ?LOS: ?7 days ?A FACE TO FACE EVALUATION WAS PERFORMED ? ?Henry Simmons ?06/21/2021, 8:39 AM  ? ? ? ?

## 2021-06-22 DIAGNOSIS — I63512 Cerebral infarction due to unspecified occlusion or stenosis of left middle cerebral artery: Secondary | ICD-10-CM | POA: Diagnosis not present

## 2021-06-22 NOTE — Progress Notes (Signed)
Physical Therapy Session Note ? ?Patient Details  ?Name: Henry Simmons ?MRN: [id number] ?Date of Birth: [birth year] ? ?Today's Date: 06/22/2021 ?PT Individual Time: 7591-6384 ?PT Individual Time Calculation (min): 30 min  ? ?Short Term Goals: ?Week 1:  PT Short Term Goal 1 (Week 1): pt will transfer to [facility] with mod assist ?PT Short Term Goal 2 (Week 1): Pt will ambulate 41ft with max assist and LRAD ?PT Short Term Goal 3 (Week 1): Pt pt will propell WC 183ft with min assist ? ?Skilled Therapeutic Interventions/Progress Updates:  ?  Patient received sitting up in recliner, agreeable to PT. He shook his head "no" when asked if he had pain. Patient able to give patient "thumbs up" with hand over hand assist to lift thumb and not tuck thumb into fist. Patient able to come to stand with tactile cues for L hand placement and facilitation of anterior shift. ModA needed to come to stand. In standing, patient noted to have strong R lateral push/lean with limited ability to "turn on" R quads. Multimodal cuing provided and patient able to engage R quads and shift back to midline. Patient completed this x8 progressing to ~MinA to come to stand. Patient noted to have 2+ tone in his R hamstring. Patient remaining up in recliner, seatbelt alarm on, call light within reach.  ? ?Therapy Documentation ?Precautions:  ?Precautions ?Precaution Comments: R hemiparesis, apraxic, global aphasia, diplopia ?Restrictions ?Weight Bearing Restrictions: No ? ? ?Therapy/Group: Individual Therapy ? ?Henry Simmons ?Henry Simmons, PT, DPT, CBIS ? ?06/22/2021, 7:48 AM  ?

## 2021-06-22 NOTE — Progress Notes (Signed)
Orthopedic Tech Progress Note ?Patient Details:  ?Henry Simmons ?[birth year] ?[id number] ? ?Called in order to [facility] for a REHAB COMBO  ? ?Patient ID: Henry Simmons, male   DOB: [birth year], 51 y.o.   MRN: [id number] ? ?Henry Simmons ?06/22/2021, 3:26 PM ? ?

## 2021-06-22 NOTE — Progress Notes (Signed)
?                                                       PROGRESS NOTE ? ? ?Subjective/Complaints: ? ?Discussed communication and swallow with SLP , pt able to say "wife" once yesterday  ? ?ROS: limited due to language/communication  ? ? ?Objective: ?  ?No results found. ?No results for input(s): WBC, HGB, HCT, PLT in the last 72 hours. ?Recent Labs  ?  06/21/21 ?5284  ?CREATININE 1.34*  ? ? ? ?Intake/Output Summary (Last 24 hours) at 06/22/2021 0923 ?Last data filed at 06/22/2021 0754 ?Gross per 24 hour  ?Intake 480 ml  ?Output 925 ml  ?Net -445 ml  ? ?  ? ?  ? ?Physical Exam: ?Vital Signs ?Blood pressure 116/69, pulse 92, temperature 98.3 ?F (36.8 ?C), resp. rate 17, height 6' 2"  (1.88 m), weight 79.6 kg, SpO2 100 %. ? ? ? ?General: No acute distress ?Mood and affect are appropriate ?Heart: Regular rate and rhythm no rubs murmurs or extra sounds ?Lungs: Clear to auscultation, breathing unlabored, no rales or wheezes ?Abdomen: Positive bowel sounds, soft nontender to palpation, nondistended ?Extremities: No clubbing, cyanosis, or edema ?Skin: No evidence of breakdown, no evidence of rash ? ?Neurologic: globally aphasic. But able to close eyes to command , imitates gestures but perseverates, unable to perform body part ID  ?Cranial nerves II through XII intact, motor strength is 5/5 in left and 0/5 right deltoid, bicep, tricep, grip, hip flexor, knee extensors, ankle dorsiflexor and plantar flexor ?Sensory exam cannot assess due to aphasia  ? ?Musculoskeletal: No pain with RUE or LE PROM or LUE, LLE AROM  ? ? ?Assessment/Plan: ?1. Functional deficits which require 3+ hours per day of interdisciplinary therapy in a comprehensive inpatient rehab setting. ?Physiatrist is providing close team supervision and 24 hour management of active medical problems listed below. ?Physiatrist and rehab team continue to assess barriers to discharge/monitor patient progress toward functional and medical goals ? ?Care Tool: ? ?Bathing ?    ?Body parts bathed by patient: Chest, Abdomen, Right arm, Right upper leg, Left upper leg, Face  ? Body parts bathed by helper: Left arm, Right lower leg, Left lower leg, Buttocks, Front perineal area ?  ?  ?Bathing assist Assist Level: Moderate Assistance - Patient 50 - 74% ?  ?  ?Upper Body Dressing/Undressing ?Upper body dressing   ?What is the patient wearing?: Pull over shirt ?   ?Upper body assist Assist Level: Maximal Assistance - Patient 25 - 49% ?   ?Lower Body Dressing/Undressing ?Lower body dressing ? ? ?   ?What is the patient wearing?: Incontinence brief, Pants ? ?  ? ?Lower body assist Assist for lower body dressing: Maximal Assistance - Patient 25 - 49% ?   ? ?Toileting ?Toileting    ?Toileting assist Assist for toileting: Moderate Assistance - Patient 50 - 74% ?  ?  ?Transfers ?Chair/bed transfer ? ?Transfers assist ?   ? ?Chair/bed transfer assist level: Moderate Assistance - Patient 50 - 74% ?  ?  ?Locomotion ?Ambulation ? ? ?Ambulation assist ? ? Ambulation activity did not occur: Safety/medical concerns ? ?  ?  ?   ? ?Walk 10 feet activity ? ? ?Assist ? Walk 10 feet activity did not occur: Safety/medical concerns ? ?  ?   ? ?  Walk 50 feet activity ? ? ?Assist Walk 50 feet with 2 turns activity did not occur: Safety/medical concerns ? ?  ?   ? ? ?Walk 150 feet activity ? ? ?Assist Walk 150 feet activity did not occur: Safety/medical concerns ? ?  ?  ?  ? ?Walk 10 feet on uneven surface  ?activity ? ? ?Assist Walk 10 feet on uneven surfaces activity did not occur: Safety/medical concerns ? ? ?  ?   ? ?Wheelchair ? ? ? ? ?Assist Is the patient using a wheelchair?: Yes ?Type of Wheelchair: Manual ?  ? ?Wheelchair assist level: Maximal Assistance - Patient 25 - 49% ?Max wheelchair distance: 150  ? ? ?Wheelchair 50 feet with 2 turns activity ? ? ? ?Assist ? ?  ?  ? ? ?Assist Level: Maximal Assistance - Patient 25 - 49%  ? ?Wheelchair 150 feet activity  ? ? ? ?Assist ?   ? ? ?Assist Level: Maximal  Assistance - Patient 25 - 49%  ? ?Blood pressure 116/69, pulse 92, temperature 98.3 ?F (36.8 ?C), resp. rate 17, height 6' 2"  (1.88 m), weight 79.6 kg, SpO2 100 %. ? ?Medical Problem List and Plan: ?1. 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            -patient may  shower ?            -ELOS/Goals: 14-20 days min A for SLP,PT and OT- Team conference today please see physician documentation under team conference tab, met with team  to discuss problems,progress, and goals. Formulized individual treatment plan based on medical history, underlying problem and comorbidities. ? ? -Continue CIR therapies including PT, OT, and SLP  ?2.  Antithrombotics: ?-DVT/anticoagulation:  Pharmaceutical: Lovenox ?            -antiplatelet therapy: Aspirin 81 mg daily and Plavix 75 mg daily ?3. Pain Management: Tylenol as needed ?4. Mood: Provide emotional support ?            -antipsychotic agents: N/A ?5. Neuropsych: This patient is not capable of making decisions on his own behalf. ?6. Skin/Wound Care: Routine skin checks ?7. Fluids/Electrolytes/Nutrition: Routine in and outs with follow-up chemistries ?8.  CAD/LAD stent/non-STEMI.  Continue aspirin and Plavix.  Follow-up per cardiology services ?9.  Hypertrophic cardiomyopathy/bradycardia.  Initially with temporary pacer.  No current plan for permanent pacemaker.  Continue Jardiance 10 mg daily. ?10.  Hypertension.  Norvasc 2.5 mg daily, hydralazine 10 mg twice daily, Isordil 10 mg twice daily,Lopressor 12.5 mg BID, Cozaar 100 mg daily. Monitor with increased mobility ?Vitals:  ? 06/21/21 1945 06/22/21 0414  ?BP: 128/80 116/69  ?Pulse: 77 92  ?Resp: 18 17  ?Temp: 98.3 ?F (36.8 ?C) 98.3 ?F (36.8 ?C)  ?SpO2: 100% 100%  ?Given intracranial stenosis would allow BP to run upper normal 130-140s, will decrease Cozaar to 12.57m starting on 5/9- may need 2-3 d to  equilibrate  ? ? ?11.  CKD stage III.  Follow-up chemistries- ? ?  Latest Ref Rng & Units 06/21/2021  ?  5:48 AM 06/15/2021  ?  5:23 AM 06/14/2021  ?  6:57 PM  ?BMP  ?Glucose 70 - 99 mg/dL  115     ?BUN 6 - 20 mg/dL  35     ?Creatinine 0.61 - 1.24 mg/dL 1.34   1.37   1.39    ?Sodium 135 - 145 mmol/L  138     ?Potassium 3.5 - 5.1 mmol/L  4.4     ?Chloride 98 - 111 mmol/L  104     ?CO2 22 - 32 mmol/L  25     ?Calcium 8.9 - 10.3 mg/dL  9.6     ? ?BUN/Creat creeping up but GFR still normal , reducing Cozaar should help , enc po fluid  ?-if intake < 1045m needs IV overnite  ?Inconsistent intake  ?12.  Hyperlipidemia.  Lipitor ? 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 -primarily oral ? -D2 thin diet ? -has good appetite ? ?LOS: ?8 days ?A FACE TO FACE EVALUATION WAS PERFORMED ? ?ALuanna SalkKirsteins ?06/22/2021, 9:23 AM  ? ? ? ?

## 2021-06-22 NOTE — Progress Notes (Signed)
Physical Therapy Session Note ? ?Patient Details  ?Name: Henry Simmons ?MRN: [id number] ?Date of Birth: Sep 14, 1970 ? ?Today's Date: 06/22/2021 ?PT Individual Time: 1355-1450 ?PT Individual Time Calculation (min): 55 min  ? ?Short Term Goals: ?Week 1:  PT Short Term Goal 1 (Week 1): pt will transfer to [facility] with mod assist ?PT Short Term Goal 2 (Week 1): Pt will ambulate 59f with max assist and LRAD ?PT Short Term Goal 3 (Week 1): Pt pt will propell WC 1097fwith min assist ? ?Skilled Therapeutic Interventions/Progress Updates:  ? ?Pt received supine in bed and agreeable to PT. Supine>sit transfer with mod assist and cues for attention to the RLE. Sitting balance EOB x 5 min to don shoes with min assist for balance to prevent LOB to the R intermittently. Total A for shoe management. [facility] and [facility]. Sit<>stand at lite gait with mod assist to block the RLE. BWSTT 3 min +1.5 min +2 min total 40531ft 0.5 mph. Total A for RLE management with cues for full step length and knee extension on the RLE. Overground BWS gait training with max assist +2 for steering and total A for RLE management. Pt returned to room and performed squat pivot transfer to bed with mod assist. Sit>supine completed with mod assist for R side management. Pt   left supine in bed with call bell in reach and all needs met.  ? ? ? ? ?   ? ?Therapy Documentation ?Precautions:  ?Precautions ?Precaution Comments: R hemiparesis, apraxic, global aphasia, diplopia ?Restrictions ?Weight Bearing Restrictions: No ? ?  ?Vital Signs: ?Therapy Vitals ?Temp: 98.3 ?F (36.8 ?C) ?Pulse Rate: 75 ?Resp: 18 ?BP: 109/81 ?Patient Position (if appropriate): Lying ?Oxygen Therapy ?SpO2: 100 % ?O2 Device: Room Air ?Pain: ? Faces: none.  ? ? ?Therapy/Group: Individual Therapy ? ?AusLorie Phenix/11/2021, 2:51 PM  ?

## 2021-06-22 NOTE — Progress Notes (Signed)
Occupational Therapy Session Note ? ?Patient Details  ?Name: Henry Simmons ?MRN: [id number] ?Date of Birth: [birth year] ? ?Today's Date: 06/22/2021 ?OT Individual Time: 0881-1031 ?OT Individual Time Calculation (min): 41 min  ? ? ?Short Term Goals: ?Week 1:  OT Short Term Goal 1 (Week 1): Pt will complete ADL task EOB with no more than min A for balance. ?OT Short Term Goal 2 (Week 1): Pt will don shirt in supported sitting with min A. ?OT Short Term Goal 3 (Week 1): Pt will complete grooming task with no more than min A. ?OT Short Term Goal 4 (Week 1): Pt will complete toilet transfer with max A and LRAD. ? ?Skilled Therapeutic Interventions/Progress Updates:  ?  Pt received awake semi-reclined in bed, no s/sx pain and appears agreeable to therapy. Session focus on self-care retraining, activity tolerance, RUE NMR in prep for improved ADL/IADL/func mobility performance + decreased caregiver burden. ? ?Came to sitting EOB on his L with min A to progress RLE. Multiple slow LOB posteriorly onto bed when engaged in LBD. Min A to remove/thread RUE when completing UBD, pt does not follow multimodal cues to allow for therapist to assist RUE first prior to attempting donning remainder of shirt. Total A to don teds, shoes and pants, sit to stand in stedy with min A to prevent R/anterior LOB. Stedy transfer > w/c > recliner. ? ?Attempted to engaged pt in self PROM utilizing LUE. Pt able to assist R elbow flexion 2x10 reps, but unable to execute passive stretch in elbow extension. ? ?Pt left seated in recliner with safety belt alarm engaged, call bell in reach, and all immediate needs met.  ? ? ?Therapy Documentation ?Precautions:  ?Precautions ?Precaution Comments: R hemiparesis, apraxic, global aphasia, diplopia ?Restrictions ?Weight Bearing Restrictions: No ? ?Pain: no s/sx throughout ?  ?ADL: See Care Tool for more details. ? ? ?Therapy/Group: Individual Therapy ? ?Volanda Napoleon MS, OTR/L ? ? ?06/22/2021, 6:59 AM ?

## 2021-06-22 NOTE — Progress Notes (Signed)
Patient ID: Henry Simmons, male   DOB: [birth year], 51 y.o.   MRN: [id number] ?Team Conference Report to Patient/Family ? ?Team Conference discussion was reviewed with the patient and caregiver, including goals, any changes in plan of care and target discharge date.  Patient and caregiver express understanding and are in agreement.  The patient has a target discharge date of 07/07/21. ? ?Sw spoke with patient and called spouse by phone. SW provided team conference updates. Patient spouse requesting HH. No additional questions or concerns. ? ?Andria Rhein ?06/22/2021, 2:46 PM  ?

## 2021-06-22 NOTE — Progress Notes (Addendum)
Speech Language Pathology Weekly Progress and Session Note ? ?Patient Details  ?Name: Henry Simmons ?MRN: [id number] ?Date of Birth: 1970-10-07 ? ?Beginning of progress report period: Jun 15, 2021 ?End of progress report period: Jun 22, 2021 ? ?Today's Date: 06/22/2021 ?SLP Individual Time: 4696-2952 ?SLP Individual Time Calculation (min): 60 min ? ?Short Term Goals: ?Week 1: SLP Short Term Goal 1 (Week 1): Pt will answer basic yes/no questions with 50% accuracy via multimodal means with Max A.  - Pt answered yes/no questions re: personal/factual information with ~70% accuracy Max multimodal A for side to side head movement for "no" and tactile cue to thumb to produce thumbs up for "yes" + written choice prompts. Benefited from asking close-ended question re: self, providing written choices in field of 3, and having pt select correct answer. Then confirmed answer via yes/no responses. GOAL MET ? ?SLP Short Term Goal 2 (Week 1): Pt will follow one-step commands within context of functional task on 50% of trials given Max A. - Followed consistent one-step command to touch specific body parts on self on ~50% of trials given Mod-Max A; 100% accuracy with Total A to include mirror for visual feedback. GOAL MET ? ?SLP Short Term Goal 3 (Week 1): Pt will complete receptive ID of common objects and pictures with 80% accuracy given Min A. - Did not formally address this date, though did accurately select written words to answer questions re: personal information, orientation questions (oriented x 3), and questions re: musicians in field of 3 with ~90% accuracy given Min A. GOAL MET ? ?SLP Short Term Goal 4 (Week 1): Pt will vocalize/phonation x 1 given Max A. - Laughed with phonation x 1. NOT MET ? ?SLP Short Term Goal 5 (Week 1): Pt will utilize multimodal communication methods to communicate wants, needs, thoughts, and ideas x 5 with Max A. - x 8 with Max multimodal A.  GOAL MET ? ?SLP Short Term Goal 6 (Week 1): Will  tolerate dysphagia 2 and thin liquid consistencies with Min A for use of safe swallow strategies and minimal s/sx concerning for aspiration. Cough x 1 with thin liquid via cup, no additional s/sx concerning for aspiration. Did not trial solid textures. GOAL MET ? ?  ?New Short Term Goals: ?Week 2: SLP Short Term Goal 1 (Week 2): Pt will answer basic yes/no questions with 70% accuracy via multimodal means with Max A. ?SLP Short Term Goal 2 (Week 2): Pt will follow one-step commands within context of functional task on 50% of trials given Min A. ?SLP Short Term Goal 3 (Week 2): Pt will complete receptive ID of common + functional words with 80% accuracy given Min A. ?SLP Short Term Goal 4 (Week 2): Pt will vocalize/phonation x 1 given Max A. ?SLP Short Term Goal 5 (Week 2): Pt will utilize multimodal communication methods to communicate wants, needs, thoughts, and ideas x 10 with written choice prompts. ?SLP Short Term Goal 6 (Week 2): Will consume trials of Dysphagia 3 textures with minimal oral residuals and no evidence of aspiration given Min A for safe swallow strategies. ? ?Weekly Progress Updates: ?Pt has made subtle gains in auditory comprehension and verbal expression as evident by meeting 5 out of 6 goals this past week. Pt remains limited by severity of communication and motor deficits. Family education has been initiated re: aspiration precautions, diet modifications, establishment of yes/no responses, and use of simple language. Pt continues to present with severe aphasia; therefore, continues to benefit from skilled  ST intervention targeting communication and oral deglutition deficits to maximize independence and decrease caregiver burden. Will continue per updated ST POC. ? ?Pt presents with severe, nonfluent aphasia (receptive > expressive abilities). ? ? ?Intensity: Minumum of 1-2 x/day, 30 to 90 minutes ?Frequency: 3 to 5 out of 7 days ?Duration/Length of Stay: 4 weeks ?Treatment/Interventions: Development worker, international aid;Dysphagia/aspiration precaution training;Environmental controls;Functional tasks;Multimodal communication approach;Patient/family education;Speech/Language facilitation ? ? ?Daily Session ?Skilled Therapeutic Interventions:  ?Pt seen this date for skilled ST intervention targeting communication goals outlined above. Pt received awake/alert and lying semi-reclined in bed. 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 Agreeable to ST intervention via head nod.  ?  ?SLP facilitated today's session by continuing to provide the following ST interventions during functional and therapeutic tx tasks: supported communication interventions (written binary choice prompts, picture cues, gesture use, establishment + reinforcement of yes/no system (head side to side for "no" and thumbs up for "yes" responses), simple language), Max multimodal A for expression of wants/needs, and tactile cueing + model prompts/demonstration for motor planning/execution. ?  ?Remains stimulable for skilled interventions and is demonstrating progress as evident by subtle improvement in basic auditory comprehension, use of basic yes/no system, and written + verbal choice prompts on whiteboard for functional communication. Please see above for objective data collected during today's session.  ?  ?Pt left in bed with bed alarm on, call bell reviewed and within reach, and all immediate needs met. Continue per ST POC.  ?    ?Pain ?Pt unable to reliably communicate pain, though NAD noted. ? ?Therapy/Group: Individual Therapy ? ?Scheryl Sanborn A Aliea Bobe ?06/22/2021, 1:20 PM ? ? ? ? ? ? ?

## 2021-06-22 NOTE — Patient Care Conference (Signed)
Inpatient RehabilitationTeam Conference and Plan of Care Update ?Date: 06/22/2021   Time: 10:24 AM  ? ? ?Patient Name: Henry Simmons      ?Medical Record Number: [id number]  ?Date of Birth: Aug 14, 1970 ?Sex: Male         ?Room/Bed: 2A44L/7N30Y-51 ?Payor Info: Payor: Advertising copywriter / Plan: Advertising copywriter OTHER / Product Type: *No Product type* /   ? ?Admit Date/Time:  06/14/2021  6:16 PM ? ?Primary Diagnosis:  Acute ischemic left MCA stroke (HCC) ? ?Hospital Problems: Principal Problem: ?  Acute ischemic left MCA stroke (HCC) ?Active Problems: ?  Right hemiplegia (HCC) ?  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 [facility]) ?  Left middle cerebral artery stroke (HCC) ? ? ? ?Expected Discharge Date: Expected Discharge Date: 07/07/21 ? ?Team Members Present: ?Physician leading conference: Dr. Claudette Laws ?Social Worker Present: Lavera Guise, BSW ?Nurse Present: Chana Bode, RN ?PT Present: Grier Rocher, PT ?OT Present: Annye English, OT ?SLP Present: Eilene Ghazi, SLP ?PPS Coordinator present : Fae Pippin, SLP ? ?   Current Status/Progress Goal Weekly Team Focus  ?Bowel/Bladder ? ? Incontinent of B/B/ LBM 5/9  Regain continence.  Toilet Q3 while awake, Q4 at hs   ?Swallow/Nutrition/ Hydration ? ? 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 Dysphagia 3 trials   ?ADL's ? ? stedy of 1 > toilet, total A for toileting tasks, LBD, mod A for bathing at sink, mod A UBD, min A for oral care/self feeding, apraxia, developing tone R biceps, R digit flexors, no activiation noted so far  min to mod  RUE NMR, self-care/transfer/balance retraining, midline orientation, pt/family/DME/AE education   ?Mobility ? ? Requires MAX cues prior to moving d/t high impulsivity ::: Bed mobility = MinA; Squat pivot transfer to R = MinA, stand  pivot to L = Mod/ MaxA; Ambulation = short distance MaxA for knee block and RLE advancement using L HR  Bed mobility at SUP level, Transfers at minA, Ambulation with modA, stairs with ModA, w/c mobility with SUP  sitting and standing balance, R attn, transfers to R and L, NMR for R hemibody, family education with wife   ?Communication ? ? Max A for auditory comprehension and verbal expression  Mod A for auditory comprehension; Max A for verbal expression  yes/no responses, selection of written choices, phonation, and command following   ?Safety/Cognition/ Behavioral Observations ? Unable to target at this time due to severity of aphasia + apraxia  N/A  N/A   ?Pain ? ? No c/o pain  Pain </=3/10  Assess Qshift and prn   ?Skin ? ? Masd to groin/thigh area.  No new skin breakdown  Assess Qshift and prn   ? ? ?Discharge Planning:  ?Discharging home with spouse works (8:30-3PM). SW will follow up with spouse to spouse on 24/7 supervision   ?Team Discussion: ?Patient  with right hemiplegia, apraxia, tone in right bicep post left MCA CVA. ? ?Patient on target to meet rehab goals: ?yes, currently needs mod assist for bathing at the sink. Requires min - mod assist for upper body care and total assist for toileting. Needs mod assist to the right for squat pivots to/from a DA-BSC and +2 for safety to the left. Squat pivots to the left can be mod - max assist. Needs max assist for expression with audibile  comprehension does better with written and Verbal cues and able to follow 1 step commands. Anticipate he will be w/c level at discharge; ambulating with therapy only. Goals for discharge set for min - mod assist. ? ?*See Care Plan and progress notes for long and short-term goals.  ? ?Revisions to Treatment Plan:  ?Body supported treadmill ?Resting hand splint ?D3 diet trials ?  ?Teaching Needs: ?Safety, dietary modifications, medications, transfers, toileting, etc  ?Current Barriers to Discharge: ?Decreased caregiver support  and Home enviroment access/layout ? ?Possible Resolutions to Barriers: ?Family education ?  ? ? Medical Summary ?  ?  ?  ?  ? ? ?  ? ? ?I attest that I was present, lead the team conference, and concur with the assessment and plan of the team. ? ? ?Chana Bode B ?06/22/2021, 4:49 PM  ? ? ? ? ? ? ?

## 2021-06-23 DIAGNOSIS — N183 Chronic kidney disease, stage 3 unspecified: Secondary | ICD-10-CM

## 2021-06-23 DIAGNOSIS — R1310 Dysphagia, unspecified: Secondary | ICD-10-CM

## 2021-06-23 LAB — GLUCOSE, CAPILLARY: Glucose-Capillary: 148 mg/dL — ABNORMAL HIGH (ref 70–99)

## 2021-06-23 NOTE — Progress Notes (Signed)
Speech Language Pathology Daily Session Note ? ?Patient Details  ?Name: Henry Simmons ?MRN: [id number] ?Date of Birth: February 26, 1970 ? ?Today's Date: 06/23/2021 ?SLP Individual Time: 1300-1400 ?SLP Individual Time Calculation (min): 60 min ? ?Short Term Goals: ?Week 2: SLP Short Term Goal 1 (Week 2): Pt will answer basic yes/no questions with 70% accuracy via multimodal means with Max A. - Answered basic yes/no questions re: personal information with ~80% accuracy given low-tech AAC yes/no board (written choices). ? ?SLP Short Term Goal 2 (Week 2): Pt will follow one-step commands within context of functional task on 50% of trials given Min A. - Did not formally address this session.  ? ?SLP Short Term Goal 3 (Week 2): Pt will complete receptive ID of common + functional words with 80% accuracy given Min A.- Completed receptive ID of common and functional words with 80% accuracy given overall Sup A. ? ?SLP Short Term Goal 4 (Week 2): Pt will vocalize/phonation x 1 given Max A. - Verbalized "yeah" x 1 given extended wait time. ? ?SLP Short Term Goal 5 (Week 2): Pt will utilize multimodal communication methods to communicate wants, needs, thoughts, and ideas x 10 with written choice prompts. - x 10 with written choice prompts (field of 2-3) and overall Min A. ? ?SLP Short Term Goal 6 (Week 2): Will consume trials of Dysphagia 3 textures with minimal oral residuals and no evidence of aspiration given Min A for safe swallow strategies. - Did not formally address this session.  ? ?Skilled Therapeutic Interventions: ?Pt seen this date for skilled ST intervention targeting communication goals outlined above. Pt received lethargic, OOB lying semi-reclined in recliner chair. Recently finished lunch and ate ~80%. 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 Agreeable to ST intervention via head nod.  ?  ?SLP facilitated today's session by continuing to provide the  following ST interventions during functional and therapeutic tx tasks: supported communication interventions (written binary choice prompts, picture cues, gesture use, establishment + reinforcement of yes/no system (head side to side for "no" and thumbs up for "yes" responses), simple language), Max multimodal A for expression of wants/needs, and tactile cueing + model prompts/demonstration for motor planning/execution to include articulatory placement during phonation tasks. ?  ?Remains stimulable for skilled interventions and is demonstrating progress as evident by subtle improvement in basic auditory comprehension, use of basic yes/no system, and written + verbal choice prompts on whiteboard for functional communication. Please see above for objective data collected during today's session. Education and update provided to RN re: pt's current level of communication support and review of written communication/AAC aids left in room. Reviewed with pt's son upon delivery of aids; he verbalized understanding.  ?  ?Pt left in bed with bed alarm on, call bell reviewed and within reach, and all immediate needs met. Continue per ST POC.  ? ?Pain ?Pt denied pain when provided with written communication supports; NAD ? ?Therapy/Group: Individual Therapy ? ?Henry Simmons ?06/23/2021, 4:07 PM ?

## 2021-06-23 NOTE — Progress Notes (Signed)
?                                                       PROGRESS NOTE ? ? ?Subjective/Complaints: ? ?Pt in [facility] working with therapy.  Severe global aphasia limits communication. No new concerns noted.  ? ?ROS: limited due to language/communication  ? ? ?Objective: ?  ?No results found. ?No results for input(s): WBC, HGB, HCT, PLT in the last 72 hours. ?Recent Labs  ?  06/21/21 ?5093  ?CREATININE 1.34*  ? ? ? ?Intake/Output Summary (Last 24 hours) at 06/23/2021 0947 ?Last data filed at 06/23/2021 0700 ?Gross per 24 hour  ?Intake 960 ml  ?Output --  ?Net 960 ml  ? ?  ? ?  ? ?Physical Exam: ?Vital Signs ?Blood pressure 132/84, pulse 74, temperature 98 ?F (36.7 ?C), temperature source Oral, resp. rate 18, height _0  (1.88 m), weight 79.6 kg, SpO2 100 %. ? ? ? ?General: No acute distress, sitting in WC ?Mood and affect are appropriate ?Heart: Regular rate and rhythm no rubs murmurs or extra sounds ?Lungs: Clear to auscultation, breathing unlabored, no rales or wheezes ?Abdomen: Positive bowel sounds, soft nontender to palpation, nondistended ?Extremities: No clubbing, cyanosis, or edema ?Skin: No evidence of breakdown, no evidence of rash. Warm and dry ? ?Neurologic: globally aphasic. But able to close eyes to command , imitates gestures but perseverates, unable to perform body part ID  ?Cranial nerves II through XII intact, motor strength is 5/5 in left and 0/5 right deltoid, bicep, tricep, grip, hip flexor, knee extensors, ankle dorsiflexor and plantar flexor ?Sensory exam cannot assess due to aphasia  ? ?Musculoskeletal: No pain with RUE or LE PROM or LUE, LLE AROM . No joint swelling noted ? ? ?Assessment/Plan: ?1. Functional deficits which require 3+ hours per day of interdisciplinary therapy in a comprehensive inpatient rehab setting. ?Physiatrist is providing close team supervision and 24 hour management of active medical problems listed below. ?Physiatrist and rehab team continue to assess barriers to  discharge/monitor patient progress toward functional and medical goals ? ?Care Tool: ? ?Bathing ?   ?Body parts bathed by patient: Chest, Abdomen, Right arm, Right upper leg, Left upper leg, Face  ? Body parts bathed by helper: Left arm, Right lower leg, Left lower leg, Buttocks, Front perineal area ?  ?  ?Bathing assist Assist Level: Moderate Assistance - Patient 50 - 74% ?  ?  ?Upper Body Dressing/Undressing ?Upper body dressing   ?What is the patient wearing?: Pull over shirt ?   ?Upper body assist Assist Level: Maximal Assistance - Patient 25 - 49% ?   ?Lower Body Dressing/Undressing ?Lower body dressing ? ? ?   ?What is the patient wearing?: Incontinence brief, Pants ? ?  ? ?Lower body assist Assist for lower body dressing: Maximal Assistance - Patient 25 - 49% ?   ? ?Toileting ?Toileting    ?Toileting assist Assist for toileting: Moderate Assistance - Patient 50 - 74% ?  ?  ?Transfers ?Chair/bed transfer ? ?Transfers assist ?   ? ?Chair/bed transfer assist level: Moderate Assistance - Patient 50 - 74% ?  ?  ?Locomotion ?Ambulation ? ? ?Ambulation assist ? ? Ambulation activity did not occur: Safety/medical concerns ? ?  ?  ?   ? ?Walk 10 feet activity ? ? ?Assist ? Walk  10 feet activity did not occur: Safety/medical concerns ? ?  ?   ? ?Walk 50 feet activity ? ? ?Assist Walk 50 feet with 2 turns activity did not occur: Safety/medical concerns ? ?  ?   ? ? ?Walk 150 feet activity ? ? ?Assist Walk 150 feet activity did not occur: Safety/medical concerns ? ?  ?  ?  ? ?Walk 10 feet on uneven surface  ?activity ? ? ?Assist Walk 10 feet on uneven surfaces activity did not occur: Safety/medical concerns ? ? ?  ?   ? ?Wheelchair ? ? ? ? ?Assist Is the patient using a wheelchair?: Yes ?Type of Wheelchair: Manual ?  ? ?Wheelchair assist level: Maximal Assistance - Patient 25 - 49% ?Max wheelchair distance: 150  ? ? ?Wheelchair 50 feet with 2 turns activity ? ? ? ?Assist ? ?  ?  ? ? ?Assist Level: Maximal Assistance -  Patient 25 - 49%  ? ?Wheelchair 150 feet activity  ? ? ? ?Assist ?   ? ? ?Assist Level: Maximal Assistance - Patient 25 - 49%  ? ?Blood pressure 132/84, pulse 74, temperature 98 ?F (36.7 ?C), temperature source Oral, resp. rate 18, height _0  (1.88 m), weight 79.6 kg, SpO2 100 %. ? ?Medical Problem List and Plan: ?1. 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            -patient may  shower ?            -ELOS/Goals: Expected discharge 07/07/21 min A/Mod A for SLP,PT and OT- Team conference today please see physician documentation under team conference tab, met with team  to discuss problems,progress, and goals. Formulized individual treatment plan based on medical history, underlying problem and comorbidities. ? -Continue CIR therapies including PT, OT, and SLP  ? -Severe global aphasia ?2.  Antithrombotics: ?-DVT/anticoagulation:  Pharmaceutical: Lovenox ?            -antiplatelet therapy: Aspirin 81 mg daily and Plavix 75 mg daily ?3. Pain Management: Tylenol as needed ?4. Mood: Provide emotional support ?            -antipsychotic agents: N/A ?5. Neuropsych: This patient is not capable of making decisions on his own behalf. ?6. Skin/Wound Care: Routine skin checks ?7. Fluids/Electrolytes/Nutrition: Routine in and outs with follow-up chemistries ?8.  CAD/LAD stent/non-STEMI.  Continue aspirin and Plavix.  Follow-up per cardiology services ?9.  Hypertrophic cardiomyopathy/bradycardia.  Initially with temporary pacer.  No current plan for permanent pacemaker.  Continue Jardiance 10 mg daily. ?10.  Hypertension.  Norvasc 2.5 mg daily, hydralazine 10 mg twice daily, Isordil 10 mg twice daily,Lopressor 12.5 mg BID, Cozaar 100 mg daily. Monitor with increased mobility ?Vitals:  ? 06/22/21 1933 06/23/21 0421  ?BP: 105/83 132/84  ?Pulse: 83 74  ?Resp: 18 18  ?Temp: 98.7 ?F (37.1 ?C) 98 ?F (36.7 ?C)   ?SpO2: 100% 100%  ?Given intracranial stenosis would allow BP to run upper normal 130-140s, will decrease Cozaar to 12.31m starting on 5/9- may need 2-3 d to equilibrate ?5/11 BP overall well controlled,  sometimes bellow goal 130s-140s, Continue decreasing cozaar further ? ? ?11.  CKD stage III.  Follow-up chemistries- ? ?  Latest Ref Rng & Units 06/21/2021  ?  5:48 AM 06/15/2021  ?  5:23 AM 06/14/2021  ?  6:57 PM  ?BMP  ?Glucose 70 - 99 mg/dL  115     ?BUN 6 - 20 mg/dL  35     ?Creatinine 0.61 - 1.24 mg/dL 1.34   1.37   1.39    ?Sodium 135 - 145 mmol/L  138     ?Potassium 3.5 - 5.1 mmol/L  4.4     ?Chloride 98 - 111 mmol/L  104     ?CO2 22 - 32 mmol/L  25     ?Calcium 8.9 - 10.3 mg/dL  9.6     ? ?BUN/Creat creeping up but GFR still normal , reducing Cozaar should help , enc po fluid  ?-if intake < 1067m needs IV overnite  ?Inconsistent intake  ?-Continue to encourage fluid intake ?12.  Hyperlipidemia.  Lipitor ? 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 -primarily oral ? -D2 thin diet ? -has good appetite ?-5/11 appears to be eating most of his meals ? ?LOS: ?9 days ?A FACE TO FACE EVALUATION WAS PERFORMED ? ?YJennye Boroughs?06/23/2021, 9:47 AM  ? ? ? ?

## 2021-06-23 NOTE — Progress Notes (Signed)
Physical Therapy Weekly Progress Note ? ?Patient Details  ?Name: Henry Simmons ?MRN: [id number] ?Date of Birth: [birth year] ? ?Beginning of progress report period: Jun 15, 2021 ?End of progress report period: May 24, 2021 ? ?Today's Date: 06/23/2021 ?PT Individual Time: 1103-1200 ?PT Individual Time Calculation (min): 57 min  ? ?Patient has met 3 of 3 short term goals.  Pt is making slow progress towards goals of min assist transfers, supervision assist WC mobility, and mod assist gait. Currently performing transfers with mod assist and gait with max A* +2 for safety to control the RLE, and BUE support. Parents have been present for some education, but will not be primary care giver. This PT has not met pt's SO. Continues to be extremely limited by R hemiplegia and R inattention.  ? ?Patient continues to demonstrate the following deficits muscle weakness, muscle joint tightness, and muscle paralysis, decreased cardiorespiratoy endurance, abnormal tone, unbalanced muscle activation, motor apraxia, decreased coordination, and decreased motor planning, decreased visual perceptual skills, decreased visual motor skills, field cut, and hemianopsia, decreased midline orientation and decreased attention to right, decreased initiation, decreased attention, decreased awareness, decreased problem solving, decreased safety awareness, decreased memory, and delayed processing, and decreased sitting balance, decreased standing balance, decreased postural control, hemiplegia, and decreased balance strategies and therefore will continue to benefit from skilled PT intervention to increase functional independence with mobility. ? ?Patient progressing toward long term goals..  Continue plan of care. ? ?PT Short Term Goals ?Week 1:  PT Short Term Goal 1 (Week 1): pt will transfer to [facility] with mod assist ?PT Short Term Goal 1 - Progress (Week 1): Met ?PT Short Term Goal 2 (Week 1): Pt will ambulate 90f with max assist and LRAD ?PT Short  Term Goal 2 - Progress (Week 1): Met ?PT Short Term Goal 3 (Week 1): Pt pt will propell WC 1068fwith min assist ?PT Short Term Goal 3 - Progress (Week 1): Met ?Week 2:  PT Short Term Goal 1 (Week 2): [facility] ?PT Short Term Goal 2 (Week 2): Pt will propell WC 1501fith min assist ?PT Short Term Goal 3 (Week 2): Pt will ambulate 26f33fth LRAD and max assist +2 consistently ? ?Skilled Therapeutic Interventions/Progress Updates:  ? ?Pt received sitting in WC and agreeable to PT. Pt transported to rail in hall Gait training with Max A* +2. X 30ft79f then LUE HHA and Max-total A R side 2 x 75ft.25frequired to block the RLE, perform advancement 50% of the time and max cues for trunkal and hip extension to advance the LLE. Sit<>stand x 10 with LEU supports on +2 or HW; RLE blocked throughout. Weight shift R and L 2 x 10 Bil with max assist on the R side. Stepping the LLE 2 x 10 with RLE blocked with max assist +2 for safety and cues for posture and hip/trunk extension. Patient returned to room and [facility] and all needs met.  Unable to locate R side lap tray on this day.  ? ? ? ?   ? ?Therapy Documentation ?Precautions:  ?Precautions ?Precaution Comments: R hemiparesis, apraxic, global aphasia, diplopia ?Restrictions ?Weight Bearing Restrictions: No ?RLE Weight Bearing: Non weight bearing ? ?  ?Pain: ? denies ? ? ?Therapy/Group: Individual Therapy ? ?AustinLorie Phenix/2023, 12:41 PM  ?

## 2021-06-23 NOTE — Plan of Care (Signed)
?  Problem: RH BOWEL ELIMINATION ?Goal: RH STG MANAGE BOWEL WITH ASSISTANCE ?Description: STG Manage Bowel with mod I Assistance. ?Outcome: Not Progressing; incontinence ?  ?Problem: RH PAIN MANAGEMENT ?Goal: RH STG PAIN MANAGED AT OR BELOW PT'S PAIN GOAL ?Description: At or below level 4 with prns ?Outcome: Progressing ?  ?Problem: RH KNOWLEDGE DEFICIT ?Goal: RH STG INCREASE KNOWLEDGE OF HYPERTENSION ?Description: Patient and spouse will be able to manage HTN with medications and dietary modifications using handouts and educational resources independently ?Outcome: Not Progressing; aphasic ?  ?

## 2021-06-23 NOTE — Progress Notes (Signed)
Physical Therapy Session Note ? ?Patient Details  ?Name: Henry Simmons ?MRN: [id number] ?Date of Birth: 03-23-1970 ? ?Today's Date: 06/23/2021 ?PT Individual Time: 2637-8588 ?PT Individual Time Calculation (min): 30 min  ? ?Short Term Goals: ?Week 1:  PT Short Term Goal 1 (Week 1): pt will transfer to [facility] with mod assist ?PT Short Term Goal 2 (Week 1): Pt will ambulate 27ft with max assist and LRAD ?PT Short Term Goal 3 (Week 1): Pt pt will propell WC 168ft with min assist ? ?Skilled Therapeutic Interventions/Progress Updates:  ?  Pt received supine in bed, agreeable to PT session. No complaints of pain. Assisted pt with donning knee high TEDs, pants, and shoes at bed level. Supine to sit with mod A needed for RLE management and some trunk control. Sit to stand with Supervision to stedy. Stedy transfer to w/c. Sit to stand from w/c to stedy with CGA in front of mirror for visual feedback for maintaining midline as pt tends to lean to the R. Standing mini-squats in front of mirror in stedy with mod cueing at hips to maintain midline and equal WB through BLE. Pt left seated in w/c in room with needs in reach, quick release belt and chair alarm in place at end of session. ? ?Therapy Documentation ?Precautions:  ?Precautions ?Precaution Comments: R hemiparesis, apraxic, global aphasia, diplopia ?Restrictions ?Weight Bearing Restrictions: No ?RLE Weight Bearing: Non weight bearing ? ? ? ? ? ? ?Therapy/Group: Individual Therapy ? ? ?Peter Congo, PT, DPT, CSRS ?06/23/2021, 12:08 PM  ?

## 2021-06-23 NOTE — Progress Notes (Signed)
Occupational Therapy Weekly Progress Note ? ?Patient Details  ?Name: Henry Simmons ?MRN: [id number] ?Date of Birth: 02-09-1971 ? ?Beginning of progress report period: Jun 16, 2019 ?End of progress report period: Jun 23, 2021 ? ?Today's Date: 06/23/2021 ?OT Individual Time: 7681-1572 ?OT Individual Time Calculation (min): 70 min  ? ? ?Patient has met 2 of 4 short term goals.  Pt has made slow but steady progress this week towards LTG. Pt has demonstrated improved midline orientation, R visual scanning, static sitting balance, but continues to require total A for LB ADL, and min A for supported sitting UB ADL. Pt cont to be primarily limited by dense R hemi and increased tone, global aphasia. RUE and hand currently assessed at Brummstrom level I and I respectively. Anticipate 24/7 S and min to mod physical assist required upon DC. Wife intermittently present for sessions, but unable to stay for more than several minutes as she works. ? ? ?Patient continues to demonstrate the following deficits: muscle weakness, decreased cardiorespiratoy endurance, impaired timing and sequencing, abnormal tone, unbalanced muscle activation, decreased coordination, and decreased motor planning, double vision, decreased midline orientation, decreased attention to right, right side neglect, decreased motor planning, and ideational apraxia, decreased attention, decreased awareness, decreased problem solving, and decreased safety awareness, and decreased sitting balance, decreased standing balance, decreased postural control, hemiplegia, and decreased balance strategies and therefore will continue to benefit from skilled OT intervention to enhance overall performance with BADL and Reduce care partner burden. ? ?Patient progressing toward long term goals..  Continue plan of care. ? ?OT Short Term Goals ?Week 1:  OT Short Term Goal 1 (Week 1): Pt will complete ADL task EOB with no more than min A for balance. ?OT Short Term Goal 1 - Progress  (Week 1): Not met ?OT Short Term Goal 2 (Week 1): Pt will don shirt in supported sitting with min A. ?OT Short Term Goal 2 - Progress (Week 1): Met ?OT Short Term Goal 3 (Week 1): Pt will complete grooming task with no more than min A. ?OT Short Term Goal 3 - Progress (Week 1): Met ?OT Short Term Goal 4 (Week 1): Pt will complete toilet transfer with max A and LRAD. ?OT Short Term Goal 4 - Progress (Week 1): Not met ?Week 2:  OT Short Term Goal 1 (Week 2): Pt will complete toilet transfer with max A and LRAD. ?OT Short Term Goal 2 (Week 2): Pt will complete ADL task EOB with no more than min A for balance. ?OT Short Term Goal 3 (Week 2): Pt will complete standing grooming task at sink with mod A of 2. ?OT Short Term Goal 4 (Week 2): Pt will don pants with max A. ? ?Skilled Therapeutic Interventions/Progress Updates:  ?  Pt received seated in w/c already dressed, agreeable to therapy. Session focus on activity tolerance, RUE NMR in prep for improved ADL/IADL/func mobility performance + decreased caregiver burden. ? ?MD in/out morning assessments. Utilizing thumbs up and yes/no board, seems to consistently indicate "yes" when asked if he has headache, made RN aware for pain med request. Total A w/c transport to and from gym. Squat-pivot to his L on mat table x2 with light mod A to safely pivot due to pt's tendency to move quickly, needs cues to wait for therapist assist.  ? ?Pt participated in various RUE NMR activities to promote functional return/RUE attention : ?-In L side-lying, completed 3x10 RUE should flexion, elbow extension/flexion with use of horizontal table and total A  to complete therex.  ?-Completed 5 min in 3 bouts of table top arm bike with ace wrap to maintain R grip.  ?-1:1 NMES applied to supraspinatus and middle deltoid to help approximate shoulder joint to reduce sublux and reduce pain.  ? ?Ratio 1:3 ?Rate 35 pps ?Waveform- Asymmetric ?Ramp 1.0 ?Pulse 300 ?Intensity-  24  ?Duration -   10  min ? ?No adverse reactions after treatment and is skin intact. No s/sx pain throughout. Total A to facilitaite grasp/release on theraputty. Pt tends to follow commands only using his L hand vs attending to his R. ? ?Pt left seated in w/c with safety belt alarm engaged, call bell in reach, and all immediate needs met.  ? ? ?Therapy Documentation ?Precautions:  ?Precautions ?Precaution Comments: R hemiparesis, apraxic, global aphasia, diplopia ?Restrictions ?Weight Bearing Restrictions: No ?RLE Weight Bearing: Non weight bearing ? ?Pain: headache, RN notified for pt request for pain meds ?  ?ADL: See Care Tool for more details. ? ? ?Therapy/Group: Individual Therapy ? ?Volanda Napoleon MS, OTR/L ? ?06/23/2021, 6:59 AM  ?

## 2021-06-24 ENCOUNTER — Encounter (HOSPITAL_COMMUNITY): Payer: Self-pay | Admitting: Physical Medicine & Rehabilitation

## 2021-06-24 DIAGNOSIS — I63512 Cerebral infarction due to unspecified occlusion or stenosis of left middle cerebral artery: Secondary | ICD-10-CM | POA: Diagnosis not present

## 2021-06-24 LAB — GLUCOSE, CAPILLARY: Glucose-Capillary: 120 mg/dL — ABNORMAL HIGH (ref 70–99)

## 2021-06-24 NOTE — Plan of Care (Signed)
?  Problem: RH PAIN MANAGEMENT ?Goal: RH STG PAIN MANAGED AT OR BELOW PT'S PAIN GOAL ?Description: At or below level 4 with prns ?Outcome: Progressing ?  ?

## 2021-06-24 NOTE — Progress Notes (Signed)
Occupational Therapy Session Note ? ?Patient Details  ?Name: Henry Simmons ?MRN: [id number] ?Date of Birth: [birth year] ? ?Today's Date: 06/24/2021 ?OT Individual Time: 7125-2712 ?OT Individual Time Calculation (min): 75 min  ? ? ?Short Term Goals: ?Week 1:  OT Short Term Goal 1 (Week 1): Pt will complete ADL task EOB with no more than min A for balance. ?OT Short Term Goal 1 - Progress (Week 1): Not met ?OT Short Term Goal 2 (Week 1): Pt will don shirt in supported sitting with min A. ?OT Short Term Goal 2 - Progress (Week 1): Met ?OT Short Term Goal 3 (Week 1): Pt will complete grooming task with no more than min A. ?OT Short Term Goal 3 - Progress (Week 1): Met ?OT Short Term Goal 4 (Week 1): Pt will complete toilet transfer with max A and LRAD. ?OT Short Term Goal 4 - Progress (Week 1): Not met ?Week 2:  OT Short Term Goal 1 (Week 2): Pt will complete toilet transfer with max A and LRAD. ?OT Short Term Goal 2 (Week 2): Pt will complete ADL task EOB with no more than min A for balance. ?OT Short Term Goal 3 (Week 2): Pt will complete standing grooming task at sink with mod A of 2. ?OT Short Term Goal 4 (Week 2): Pt will don pants with max A. ? ?Skilled Therapeutic Interventions/Progress Updates:  ?  Pt received seated in w/c, no s/sx pain and appears, agreeable to therapy. Session focus on self-care retraining, activity tolerance, RUE NMR, midline orientation in prep for improved ADL/IADL/func mobility performance + decreased caregiver burden. ?Total A w/c transport to and from gym.  ? ?Lateral scoot to his L to mat table with min A. Massed practice of sit to stand in stedy while engaged in task with LUE. Pt requires mod to max A to prevent R lean with only RUE support on rail with use of ace wrap. Requires only CGA to min A when in perched position. Able to remove/match velcro playing cards with overall max A for accuracy. Pt additionally able to complete 1x10 mini squats/push ups in stedy with max multimodal cuing  and therapist demonstrating movement for pt to complete. ? ?When prompted, with use of communication packet, pt consistently pointing to bathroom. Stedy transfer of 1 to toilet with max A to maintain RLE in alignment, tends to slide back off platform. Total A of 2 for toileting tasks post incontinent/continent void of BM.  ? ?Pt left seated in recliner, asleep with safety belt alarm engaged, call bell in reach, and all immediate needs met.  ? ? ?Therapy Documentation ?Precautions:  ?Precautions ?Precaution Comments: R hemiparesis, apraxic, global aphasia, diplopia ?Restrictions ?Weight Bearing Restrictions: No ?RLE Weight Bearing: Non weight bearing ? ?Pain: no s/sx throughout ?  ?ADL: See Care Tool for more details. ? ?Therapy/Group: Individual Therapy ? ?Volanda Napoleon MS, OTR/L ? ?06/24/2021, 6:55 AM ?

## 2021-06-24 NOTE — Progress Notes (Signed)
Speech Language Pathology Daily Session Note ? ?Patient Details  ?Name: Henry Simmons ?MRN: [id number] ?Date of Birth: [birth year] ? ?Today's Date: 06/24/2021 ?SLP Individual Time: 3646-8032 ?SLP Individual Time Calculation (min): 60 min ? ?Short Term Goals: ?Week 2: SLP Short Term Goal 1 (Week 2): Pt will answer basic yes/no questions with 70% accuracy via multimodal means with Max A. - Pt answered basic yes/no questions to indicate preferences during meals with ~80% accuracy given Min A via head nod for "yes" and head shake for "no." ? ?SLP Short Term Goal 2 (Week 2): Pt will follow one-step commands within context of functional task on 50% of trials given Min A. - Pt followed one-step commands within context of functional task (transferring from bed to w/c and with meal set-up) on ~50% of opportunities given Min A. ? ?SLP Short Term Goal 3 (Week 2): Pt will complete receptive ID of common + functional words with 80% accuracy given Min A. - 100% accuracy in field of 2-3 with Min A. ? ?SLP Short Term Goal 4 (Week 2): Pt will vocalize/phonation x 1 given Max A. - Verbalized approximation of "yeah" x 1 without prompts (automatic). Unable to elicit on command or following imitation.  ? ?SLP Short Term Goal 5 (Week 2): Pt will utilize multimodal communication methods to communicate wants, needs, thoughts, and ideas x 10 with written choice prompts. - x 10+ with use of multimodal communication methods to include written choice prompts, hand + head gestures, and yes/no questions. ? ?SLP Short Term Goal 6 (Week 2): Will consume trials of Dysphagia 3 (once upgraded) textures with minimal oral residuals and no evidence of aspiration given Min A for safe swallow strategies. - Pt consumed Dysphagia 2 textures and thin liquids given Min-Mod verbal A for adherence to aspiration precautions. No evidence concerning for aspiration observed. Mild oral residuals + R anterior oral spillage noted and cleared with Min verbal A and use of  mirror. ? ?Skilled Therapeutic Interventions: ?Pt seen this date for skilled ST intervention targeting communication goals outlined above. Pt received awake/alert and lying in bed. Smiled and nodded head when SLP arrived. Wife present at the onset of today's session. Agreeable to ST intervention.  ?  ?SLP facilitated today's session by continuing to provide the following ST interventions during functional and therapeutic tx tasks: supported communication interventions (written binary choice prompts, picture cues, gesture use, establishment + reinforcement of yes/no system (head side to side for "no" and thumbs up for "yes" responses), simple language), Max multimodal A for expression of wants/needs, biofeedback via mirror to bring awareness to oral residuals and anterior spillage on R, and tactile cueing + model prompts/demonstration for motor planning/execution to include articulatory placement during phonation tasks. ?  ?Continues to demonstrate stimulability for skilled interventions as evident by subtle improvement in basic auditory comprehension, use of basic yes/no system, and written + verbal choice prompts on whiteboard for functional communication. Please see above for objective data collected during today's session. Pt's wife present at the onset of today's session and provided with verbal education and model prompts for use of communication supports and ST POC; she verbalized understanding.  ?  ?Pt left in w/c with safety belt donned, call bell reviewed and within reach, and all immediate needs met. Continue per ST POC.  ? ?Pain ?Pt denied pain when provided with written communication supports; NAD ? ?Therapy/Group: Individual Therapy ? ?Shaheim Mahar A Kirk Basquez ?06/24/2021, 12:10 PM ?

## 2021-06-24 NOTE — Progress Notes (Signed)
?                                                       PROGRESS NOTE ? ? ?Subjective/Complaints: ?Severe global aphasia ?Patient's chart reviewed- No issues reported overnight ?Vitals signs stable  ? ?ROS: Limited due to language/communication  ? ? ?Objective: ?  ?No results found. ?No results for input(s): WBC, HGB, HCT, PLT in the last 72 hours. ?No results for input(s): NA, K, CL, CO2, GLUCOSE, BUN, CREATININE, CALCIUM in the last 72 hours. ? ?Intake/Output Summary (Last 24 hours) at 06/24/2021 1141 ?Last data filed at 06/24/2021 0700 ?Gross per 24 hour  ?Intake 592 ml  ?Output --  ?Net 592 ml  ?  ? ?  ? ?Physical Exam: ?Vital Signs ?Blood pressure 117/85, pulse 82, temperature 98.4 ?F (36.9 ?C), resp. rate 14, height 6' 2"  (1.88 m), weight 76.9 kg, SpO2 99 %. ?Gen: no distress, normal appearing ?HEENT: oral mucosa pink and moist, NCAT ?Cardio: Reg rate ?Chest: normal effort, normal rate of breathing ?Abd: soft, non-distended ?Ext: no edema ?Psych: pleasant, normal affect ?Skin: intact ? ?Neurologic: globally aphasic. But able to close eyes to command , imitates gestures but perseverates, unable to perform body part ID  ?Cranial nerves II through XII intact, motor strength is 5/5 in left and 0/5 right deltoid, bicep, tricep, grip, hip flexor, knee extensors, ankle dorsiflexor and plantar flexor ?Sensory exam cannot assess due to aphasia  ? ?Musculoskeletal: No pain with RUE or LE PROM or LUE, LLE AROM . No joint swelling noted ? ? ?Assessment/Plan: ?1. Functional deficits which require 3+ hours per day of interdisciplinary therapy in a comprehensive inpatient rehab setting. ?Physiatrist is providing close team supervision and 24 hour management of active medical problems listed below. ?Physiatrist and rehab team continue to assess barriers to discharge/monitor patient progress toward functional and medical goals ? ?Care Tool: ? ?Bathing ?   ?Body parts bathed by patient: Chest, Abdomen, Right arm, Right upper  leg, Left upper leg, Face  ? Body parts bathed by helper: Left arm, Right lower leg, Left lower leg, Buttocks, Front perineal area ?  ?  ?Bathing assist Assist Level: Moderate Assistance - Patient 50 - 74% ?  ?  ?Upper Body Dressing/Undressing ?Upper body dressing   ?What is the patient wearing?: Pull over shirt ?   ?Upper body assist Assist Level: Maximal Assistance - Patient 25 - 49% ?   ?Lower Body Dressing/Undressing ?Lower body dressing ? ? ?   ?What is the patient wearing?: Incontinence brief, Pants ? ?  ? ?Lower body assist Assist for lower body dressing: Maximal Assistance - Patient 25 - 49% ?   ? ?Toileting ?Toileting    ?Toileting assist Assist for toileting: Moderate Assistance - Patient 50 - 74% ?  ?  ?Transfers ?Chair/bed transfer ? ?Transfers assist ?   ? ?Chair/bed transfer assist level: Moderate Assistance - Patient 50 - 74% ?  ?  ?Locomotion ?Ambulation ? ? ?Ambulation assist ? ? Ambulation activity did not occur: Safety/medical concerns ? ?  ?  ?   ? ?Walk 10 feet activity ? ? ?Assist ? Walk 10 feet activity did not occur: Safety/medical concerns ? ?  ?   ? ?Walk 50 feet activity ? ? ?Assist Walk 50 feet with 2 turns activity did not occur: Safety/medical  concerns ? ?  ?   ? ? ?Walk 150 feet activity ? ? ?Assist Walk 150 feet activity did not occur: Safety/medical concerns ? ?  ?  ?  ? ?Walk 10 feet on uneven surface  ?activity ? ? ?Assist Walk 10 feet on uneven surfaces activity did not occur: Safety/medical concerns ? ? ?  ?   ? ?Wheelchair ? ? ? ? ?Assist Is the patient using a wheelchair?: Yes ?Type of Wheelchair: Manual ?  ? ?Wheelchair assist level: Maximal Assistance - Patient 25 - 49% ?Max wheelchair distance: 150  ? ? ?Wheelchair 50 feet with 2 turns activity ? ? ? ?Assist ? ?  ?  ? ? ?Assist Level: Maximal Assistance - Patient 25 - 49%  ? ?Wheelchair 150 feet activity  ? ? ? ?Assist ?   ? ? ?Assist Level: Maximal Assistance - Patient 25 - 49%  ? ?Blood pressure 117/85, pulse 82,  temperature 98.4 ?F (36.9 ?C), resp. rate 14, height 6' 2"  (1.88 m), weight 76.9 kg, SpO2 99 %. ? ?Medical Problem List and Plan: ?1. 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            -patient may  shower ?            -ELOS/Goals: Expected discharge 07/07/21 min A/Mod A for SLP,PT and OT- Team conference today please see physician documentation under team conference tab, met with team  to discuss problems,progress, and goals. Formulized individual treatment plan based on medical history, underlying problem and comorbidities. ? -Continue CIR therapies including PT, OT, and SLP  ? -Severe global aphasia ?2.  Antithrombotics: ?-DVT/anticoagulation:  Pharmaceutical: Lovenox ?            -antiplatelet therapy: Aspirin 81 mg daily and Plavix 75 mg daily ?3. Pain Management: Tylenol as needed ?4. Mood: Provide emotional support ?            -antipsychotic agents: N/A ?5. Neuropsych: This patient is not capable of making decisions on his own behalf. ?6. Skin/Wound Care: Routine skin checks ?7. Fluids/Electrolytes/Nutrition: Routine in and outs with follow-up chemistries ?8.  CAD/LAD stent/non-STEMI.  Continue aspirin and Plavix.  Follow-up per cardiology services ?9.  Hypertrophic cardiomyopathy/bradycardia.  Initially with temporary pacer.  No current plan for permanent pacemaker.  Continue Jardiance 10 mg daily. ?10.  Hypertension.  continue Norvasc 2.5 mg daily, hydralazine 10 mg twice daily, Isordil 10 mg twice daily,Lopressor 12.5 mg BID, Cozaar 100 mg daily. Monitor with increased mobility ?Vitals:  ? 06/23/21 2053 06/24/21 0449  ?BP: (!) 128/92 117/85  ?Pulse: 81 82  ?Resp: 18 14  ?Temp: 98.4 ?F (36.9 ?C) 98.4 ?F (36.9 ?C)  ?SpO2: 100% 99%  ?Given intracranial stenosis would allow BP to run upper normal 130-140s, will decrease Cozaar to 12.23m starting on 5/9- may need 2-3 d to equilibrate ?5/11  BP overall well controlled,  sometimes bellow goal 130s-140s, Continue decreasing cozaar further ? ? ?11.  CKD stage III.  Follow-up chemistries- ? ?  Latest Ref Rng & Units 06/21/2021  ?  5:48 AM 06/15/2021  ?  5:23 AM 06/14/2021  ?  6:57 PM  ?BMP  ?Glucose 70 - 99 mg/dL  115     ?BUN 6 - 20 mg/dL  35     ?Creatinine 0.61 - 1.24 mg/dL 1.34   1.37   1.39    ?Sodium 135 -  145 mmol/L  138     ?Potassium 3.5 - 5.1 mmol/L  4.4     ?Chloride 98 - 111 mmol/L  104     ?CO2 22 - 32 mmol/L  25     ?Calcium 8.9 - 10.3 mg/dL  9.6     ? ?BUN/Creat creeping up but GFR still normal , reducing Cozaar should help , enc po fluid  ?-if intake < 1059m needs IV overnite  ?Inconsistent intake  ?-Continue to encourage fluid intake ?12.  Hyperlipidemia.  continue Lipitor ? 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 -primarily oral ? -D2 thin diet ? -has good appetite ?-5/11 appears to be eating most of his meals ? ?LOS: ?10 days ?A FACE TO FACE EVALUATION WAS PERFORMED ? ?KMartha ClanP Demarious Kapur ?06/24/2021, 11:41 AM  ? ? ? ?

## 2021-06-24 NOTE — Progress Notes (Signed)
Physical Therapy Session Note ? ?Patient Details  ?Name: Henry Simmons ?MRN: [id number] ?Date of Birth: 06/21/1970 ? ?Today's Date: 06/24/2021 ?PT Individual Time: 7614-7092 ?PT Individual Time Calculation (min): 80 min  ? ?Short Term Goals: ?Week 2:  PT Short Term Goal 1 (Week 2): Pt will trasnfer to [facility] with min assist ?PT Short Term Goal 2 (Week 2): Pt will propell WC 165f with min assist ?PT Short Term Goal 3 (Week 2): Pt will ambulate 554fwith LRAD and max assist +2 consistently ? ?Skilled Therapeutic Interventions/Progress Updates:  ? ?Pt received supine in bed and agreeable to PT. Rolling R and L with mod-max assist on the R side to place and remove brief with NT present. Supine>sit transfer with mod  assist and cues  for safety to improve awareness of the RLE to protect hip. [facility]at [facility].  ? ?Pt transported to rehab gym. Donning AFo on the RLE stand pivot [facility] and RLE blocked. Pt performed sitting balance EOB x 5 minute with forward reaching to target with supervision assist on this day. Sit<>stand with LUE supported on HW x 5 with max assist to engaged the RLE into extension. Standing balance 3 x 30sec with max-total A* to block the RLE.  ? ?NMR Large muslce atrophy on the R quad 6 min rectus and medialis and 6 min rectus and lateralis with max-total A to perform SAQ through full Range while activated.  ? ?Gait training with LUE HHA and three musketeer on the R side x 10074for max-total A for control of the RLE to limb advancement and activation of knee/hip extension on R stance. Poor  attention to the RLE on this day with increasing pushers response with fatigue. Max cues  for erect posture as well  ? ?Pt returned to room and performed stand pivot transfer to bed with mod assist on the L. Sit>supine completed with mod assist for RLE management, and left supine in bed with call bell in reach and all needs met.  ? ? ?   ? ?Therapy  Documentation ?Precautions:  ?Precautions ?Precaution Comments: R hemiparesis, apraxic, global aphasia, diplopia ?Restrictions ?Weight Bearing Restrictions: No ?RLE Weight Bearing: Non weight bearing ? ?Vital Signs: ?Therapy Vitals ?Temp: 98.2 ?F (36.8 ?C) ?Temp Source: Oral ?Pulse Rate: 78 ?Resp: 18 ?BP: 116/81 ?Patient Position (if appropriate): Lying ?Oxygen Therapy ?SpO2: 100 % ?O2 Device: Room Air ?Pain: ?Faces: none ? ? ? ?Therapy/Group: Individual Therapy ? ?AusLorie Phenix/01/2022, 3:32 PM  ?

## 2021-06-25 DIAGNOSIS — M79603 Pain in arm, unspecified: Secondary | ICD-10-CM

## 2021-06-25 NOTE — Progress Notes (Addendum)
?                                                       PROGRESS NOTE ? ? ?Subjective/Complaints: ?No new complaints elicited. Global aphasia.  ? ?ROS: Limited due to language/communication  ? ? ?Objective: ?  ?No results found. ?No results for input(s): WBC, HGB, HCT, PLT in the last 72 hours. ?No results for input(s): NA, K, CL, CO2, GLUCOSE, BUN, CREATININE, CALCIUM in the last 72 hours. ? ?Intake/Output Summary (Last 24 hours) at 06/25/2021 1507 ?Last data filed at 06/25/2021 1249 ?Gross per 24 hour  ?Intake 238 ml  ?Output --  ?Net 238 ml  ? ?  ? ?  ? ?Physical Exam: ?Vital Signs ?Blood pressure 112/78, pulse 84, temperature 98.2 ?F (36.8 ?C), resp. rate 19, height _0  (1.88 m), weight 78.5 kg, SpO2 100 %. ?Gen: no distress, normal appearing, appears comfortable in bed ?HEENT: oral mucosa pink and moist, NCAT ?Cardio: Reg rate ?Chest: normal effort, normal rate of breathing, non-labored ?Abd: soft, non-distended, bowel sounds present ?Ext: no edema ?Psych: pleasant, normal affect ?Skin: intact ? ?Neurologic: globally aphasic. But able to close eyes to command , imitates gestures but perseverates, unable to perform body part ID  ?Cranial nerves II through XII intact, motor strength is 5/5 in left and 0/5 right deltoid, bicep, tricep, grip, hip flexor, knee extensors, ankle dorsiflexor and plantar flexor ?Sensory exam cannot assess due to aphasia  ? ?Musculoskeletal: No pain with RUE or LE PROM or LUE, LLE AROM . ? ? ?Assessment/Plan: ?1. Functional deficits which require 3+ hours per day of interdisciplinary therapy in a comprehensive inpatient rehab setting. ?Physiatrist is providing close team supervision and 24 hour management of active medical problems listed below. ?Physiatrist and rehab team continue to assess barriers to discharge/monitor patient progress toward functional and medical goals ? ?Care Tool: ? ?Bathing ?   ?Body parts bathed by patient: Chest, Abdomen, Right arm, Right upper leg, Left upper  leg, Face  ? Body parts bathed by helper: Left arm, Right lower leg, Left lower leg, Buttocks, Front perineal area ?  ?  ?Bathing assist Assist Level: Moderate Assistance - Patient 50 - 74% ?  ?  ?Upper Body Dressing/Undressing ?Upper body dressing   ?What is the patient wearing?: Pull over shirt ?   ?Upper body assist Assist Level: Maximal Assistance - Patient 25 - 49% ?   ?Lower Body Dressing/Undressing ?Lower body dressing ? ? ?   ?What is the patient wearing?: Incontinence brief, Pants ? ?  ? ?Lower body assist Assist for lower body dressing: Maximal Assistance - Patient 25 - 49% ?   ? ?Toileting ?Toileting    ?Toileting assist Assist for toileting: Moderate Assistance - Patient 50 - 74% ?  ?  ?Transfers ?Chair/bed transfer ? ?Transfers assist ?   ? ?Chair/bed transfer assist level: Moderate Assistance - Patient 50 - 74% ?  ?  ?Locomotion ?Ambulation ? ? ?Ambulation assist ? ? Ambulation activity did not occur: Safety/medical concerns ? ?  ?  ?   ? ?Walk 10 feet activity ? ? ?Assist ? Walk 10 feet activity did not occur: Safety/medical concerns ? ?  ?   ? ?Walk 50 feet activity ? ? ?Assist Walk 50 feet with 2 turns activity did not occur: Safety/medical concerns ? ?  ?   ? ? ?  Walk 150 feet activity ? ? ?Assist Walk 150 feet activity did not occur: Safety/medical concerns ? ?  ?  ?  ? ?Walk 10 feet on uneven surface  ?activity ? ? ?Assist Walk 10 feet on uneven surfaces activity did not occur: Safety/medical concerns ? ? ?  ?   ? ?Wheelchair ? ? ? ? ?Assist Is the patient using a wheelchair?: Yes ?Type of Wheelchair: Manual ?  ? ?Wheelchair assist level: Maximal Assistance - Patient 25 - 49% ?Max wheelchair distance: 150  ? ? ?Wheelchair 50 feet with 2 turns activity ? ? ? ?Assist ? ?  ?  ? ? ?Assist Level: Maximal Assistance - Patient 25 - 49%  ? ?Wheelchair 150 feet activity  ? ? ? ?Assist ?   ? ? ?Assist Level: Maximal Assistance - Patient 25 - 49%  ? ?Blood pressure 112/78, pulse 84, temperature 98.2 ?F (36.8  ?C), resp. rate 19, height _0  (1.88 m), weight 78.5 kg, SpO2 100 %. ? ?Medical Problem List and Plan: ?1. 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            -patient may  shower ?            -ELOS/Goals: Expected discharge 07/07/21 min A/Mod A for SLP,PT and OT- Team conference today please see physician documentation under team conference tab, met with team  to discuss problems,progress, and goals. Formulized individual treatment plan based on medical history, underlying problem and comorbidities. ? -Continue CIR therapies including PT, OT, and SLP  ? -Severe global aphasia ? -WC mobility 250 ft with min supervision and hemi technique ?2.  Antithrombotics: ?-DVT/anticoagulation:  Pharmaceutical: Lovenox ?            -antiplatelet therapy: Aspirin 81 mg daily and Plavix 75 mg daily ?3. Pain Management: Tylenol as needed ? -Not requiring frequent tylenol use, continue to monitor ?4. Mood: Provide emotional support ?            -antipsychotic agents: N/A ?5. Neuropsych: This patient is not capable of making decisions on his own behalf. ?6. Skin/Wound Care: Routine skin checks ?7. Fluids/Electrolytes/Nutrition: Routine in and outs with follow-up chemistries ?8.  CAD/LAD stent/non-STEMI.  Continue aspirin and Plavix.  Follow-up per cardiology services ?9.  Hypertrophic cardiomyopathy/bradycardia.  Initially with temporary pacer.  No current plan for permanent pacemaker.  Continue Jardiance 10 mg daily. ?10.  Hypertension.  continue Norvasc 2.5 mg daily, hydralazine 10 mg twice daily, Isordil 10 mg twice daily,Lopressor 12.5 mg BID, Cozaar 100 mg daily. Monitor with increased mobility ?Vitals:  ? 06/25/21 0516 06/25/21 1328  ?BP: 125/89 112/78  ?Pulse: 71 84  ?Resp: 14 19  ?Temp: 98.2 ?F (36.8 ?C) 98.2 ?F (36.8 ?C)  ?SpO2: 100% 100%  ?Given intracranial stenosis would allow BP to run upper normal  130-140s, will decrease Cozaar to 12.63m starting on 5/9- may need 2-3 d to equilibrate ?5/13 BP overall well controlled, continue current medications, continue to monitor ? ? ?11.  CKD stage III.  Follow-up chemistries- ? ?  Latest Ref Rng & Units 06/21/2021  ?  5:48 AM 06/15/2021  ?  5:23 AM 06/14/2021  ?  6:57 PM  ?BMP  ?Glucose 70 - 99 mg/dL  115     ?BUN 6 - 20 mg/dL  35     ?Creatinine 0.61 - 1.24 mg/dL 1.34   1.37  1.39    ?Sodium 135 - 145 mmol/L  138     ?Potassium 3.5 - 5.1 mmol/L  4.4     ?Chloride 98 - 111 mmol/L  104     ?CO2 22 - 32 mmol/L  25     ?Calcium 8.9 - 10.3 mg/dL  9.6     ? ?BUN/Creat creeping up but GFR still normal , reducing Cozaar should help , enc po fluid  ?-if intake < 1011m needs IV overnite  ?Inconsistent intake  ?-Continue to encourage fluid intake ?12.  Hyperlipidemia.  continue Lipitor ? 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 -primarily oral ? -D2 thin diet ? -has good appetite, 5/13continue to be eating most of his meals ? ?LOS: ?11 days ?A FACE TO FACE EVALUATION WAS PERFORMED ? ?YJennye Boroughs?06/25/2021, 3:07 PM  ? ? ? ?

## 2021-06-25 NOTE — Progress Notes (Signed)
Physical Therapy Session Note ? ?Patient Details  ?Name: Henry Simmons ?MRN: [id number] ?Date of Birth: 07-Mar-1970 ? ?Today's Date: 06/25/2021 ?PT Individual Time: 0905-1000 ?PT Individual Time Calculation (min): 55 min  ? ?Short Term Goals: ?Week 1:  PT Short Term Goal 1 (Week 1): pt [facility] transfer to [facility] with mod assist ?PT Short Term Goal 1 - Progress (Week 1): Met ?PT Short Term Goal 2 (Week 1): Pt will ambulate 19f with max assist and LRAD ?PT Short Term Goal 2 - Progress (Week 1): Met ?PT Short Term Goal 3 (Week 1): Pt pt will propell WC 1061fwith min assist ?PT Short Term Goal 3 - Progress (Week 1): Met ?Week 2:  PT Short Term Goal [facility] (Week 2): [facility] ?PT Short Term Goal 2 (Week 2): Pt will propell WC 15033fith min assist ?PT Short Term Goal 3 (Week 2): Pt will ambulate 8f42fth LRAD and max assist +2 consistently ?Week 3:    ? ?Skilled Therapeutic Interventions/Progress Updates:  ? ?Pt received sitting in WC and agreeable to PT. PT Donned AFO to the RLE. ? ?Pt transported to rehab gym. Sit<>stand at rail in hall x 6 with mod assist and RLE block. Pregait reciprocal stepping with BLE x 20bil with max cues foe sequencing and posture with visual feedback from mirror. Note to have intremittent hip extension activation on the R side and trace advancement on the RLE.  ? ?Gait training at Rail inhall x30ft67fh max assist from PT to facilitate limb advancement and WB through RLE with full knee extension. Transitioned to HHA on the LUE x 70ft 64f increasing pushers response with fatigue and only trace advancement of the RLE due to increasing R lateral lean. Additional gait training with three musketeer assist x 8ft w33fpt performing RLE advancement 75% of steps but total A to engage hip/knee extention on the R side for LLE limb advancement. Decreased pushers response in 3 musketeer assist.  ? ?WC mobility x 28ft wi10fin-supervision assist with hemi technique on the L sdie. Max  cues for use of LLE to steer WC to prevent hitting multiple objects on the R side max cues for visual scanning R to locate obstacles with poor cary over.  ? ?Patient returned to room and [facility] and all needs met.   ? ? ?   ? ?Therapy Documentation ?Precautions:  ?Precautions ?Precaution Comments: R hemiparesis, apraxic, global aphasia, diplopia ?Restrictions ?Weight Bearing Restrictions: No ? ? ?Pain: ?denies ? ? ? ?Therapy/Group: Individual Therapy ? ?Lakera Viall ELorie Phenix023, 10:09 AM  ?

## 2021-06-26 DIAGNOSIS — N189 Chronic kidney disease, unspecified: Secondary | ICD-10-CM

## 2021-06-26 DIAGNOSIS — I10 Essential (primary) hypertension: Secondary | ICD-10-CM

## 2021-06-26 NOTE — Progress Notes (Addendum)
?                                                       PROGRESS NOTE ? ? ?Subjective/Complaints: ?No new complaints elicited. Global aphasia.  Family in Room.  ? ?ROS: Limited due to language/communication  ? ? ?Objective: ?  ?No results found. ?No results for input(s): WBC, HGB, HCT, PLT in the last 72 hours. ?No results for input(s): NA, K, CL, CO2, GLUCOSE, BUN, CREATININE, CALCIUM in the last 72 hours. ? ?Intake/Output Summary (Last 24 hours) at 06/26/2021 1453 ?Last data filed at 06/26/2021 1300 ?Gross per 24 hour  ?Intake 600 ml  ?Output 1 ml  ?Net 599 ml  ?  ? ?  ? ?Physical Exam: ?Vital Signs ?Blood pressure 114/82, pulse 85, temperature 98.9 ?F (37.2 ?C), resp. rate 19, height 6\' 2"  (1.88 m), weight 78.5 kg, SpO2 99 %. ?Gen: no distress, normal appearing, appears comfortable in bed ?HEENT: oral mucosa pink and moist, NCAT ?Cardio: Reg rate ?Chest: normal effort, normal rate of breathing, non-labored ?Abd: soft, non-distended, bowel sounds present ?Ext: no edema ?Psych: pleasant, normal affect ?Skin: intact, warm and dry ? ?Neurologic: globally aphasic. But able to close eyes to command , imitates gestures but perseverates, unable to perform body part ID  ?Cranial nerves II through XII intact, motor strength is 5/5 in left and 0/5 right deltoid, bicep, tricep, grip, hip flexor, knee extensors, ankle dorsiflexor and plantar flexor ?Sensory exam cannot assess due to aphasia  ? ?Musculoskeletal: No pain with RUE or LE PROM or LUE, LLE AROM . ? ? ?Assessment/Plan: ?1. Functional deficits which require 3+ hours per day of interdisciplinary therapy in a comprehensive inpatient rehab setting. ?Physiatrist is providing close team supervision and 24 hour management of active medical problems listed below. ?Physiatrist and rehab team continue to assess barriers to discharge/monitor patient progress toward functional and medical goals ? ?Care Tool: ? ?Bathing ?   ?Body parts bathed by patient: Chest, Abdomen, Right  arm, Right upper leg, Left upper leg, Face  ? Body parts bathed by helper: Left arm, Right lower leg, Left lower leg, Buttocks, Front perineal area ?  ?  ?Bathing assist Assist Level: Moderate Assistance - Patient 50 - 74% ?  ?  ?Upper Body Dressing/Undressing ?Upper body dressing   ?What is the patient wearing?: Pull over shirt ?   ?Upper body assist Assist Level: Maximal Assistance - Patient 25 - 49% ?   ?Lower Body Dressing/Undressing ?Lower body dressing ? ? ?   ?What is the patient wearing?: Incontinence brief, Pants ? ?  ? ?Lower body assist Assist for lower body dressing: Maximal Assistance - Patient 25 - 49% ?   ? ?Toileting ?Toileting    ?Toileting assist Assist for toileting: Moderate Assistance - Patient 50 - 74% ?  ?  ?Transfers ?Chair/bed transfer ? ?Transfers assist ?   ? ?Chair/bed transfer assist level: Moderate Assistance - Patient 50 - 74% ?  ?  ?Locomotion ?Ambulation ? ? ?Ambulation assist ? ? Ambulation activity did not occur: Safety/medical concerns ? ?  ?  ?   ? ?Walk 10 feet activity ? ? ?Assist ? Walk 10 feet activity did not occur: Safety/medical concerns ? ?  ?   ? ?Walk 50 feet activity ? ? ?Assist Walk 50 feet with 2 turns  activity did not occur: Safety/medical concerns ? ?  ?   ? ? ?Walk 150 feet activity ? ? ?Assist Walk 150 feet activity did not occur: Safety/medical concerns ? ?  ?  ?  ? ?Walk 10 feet on uneven surface  ?activity ? ? ?Assist Walk 10 feet on uneven surfaces activity did not occur: Safety/medical concerns ? ? ?  ?   ? ?Wheelchair ? ? ? ? ?Assist Is the patient using a wheelchair?: Yes ?Type of Wheelchair: Manual ?  ? ?Wheelchair assist level: Maximal Assistance - Patient 25 - 49% ?Max wheelchair distance: 150  ? ? ?Wheelchair 50 feet with 2 turns activity ? ? ? ?Assist ? ?  ?  ? ? ?Assist Level: Maximal Assistance - Patient 25 - 49%  ? ?Wheelchair 150 feet activity  ? ? ? ?Assist ?   ? ? ?Assist Level: Maximal Assistance - Patient 25 - 49%  ? ?Blood pressure 114/82,  pulse 85, temperature 98.9 ?F (37.2 ?C), resp. rate 19, height 6\' 2"  (1.88 m), weight 78.5 kg, SpO2 99 %. ? ?Medical Problem List and Plan: ?1. 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            -patient may  shower ?            -ELOS/Goals: Expected discharge 07/07/21 min A/Mod A for SLP,PT and OT Family asked about planned DC date ? -Continue CIR therapies including PT, OT, and SLP  ? -WC mobility 250 ft with min supervision and hemi technique ?2.  Antithrombotics: ?-DVT/anticoagulation:  Pharmaceutical: Lovenox ?            -antiplatelet therapy: Aspirin 81 mg daily and Plavix 75 mg daily ?3. Pain Management: Tylenol as needed ? -Not requiring frequent tylenol use, continue to monitor ?4. Mood: Provide emotional support ?            -antipsychotic agents: N/A ?5. Neuropsych: This patient is not capable of making decisions on his own behalf. ?6. Skin/Wound Care: Routine skin checks ?7. Fluids/Electrolytes/Nutrition: Routine in and outs with follow-up chemistries ?8.  CAD/LAD stent/non-STEMI.  Continue aspirin and Plavix.  Follow-up per cardiology services ?9.  Hypertrophic cardiomyopathy/bradycardia.  Initially with temporary pacer.  No current plan for permanent pacemaker.  Continue Jardiance 10 mg daily. ?10.  Hypertension.  continue Norvasc 2.5 mg daily, hydralazine 10 mg twice daily, Isordil 10 mg twice daily,Lopressor 12.5 mg BID, Cozaar 100 mg daily. Monitor with increased mobility ?Vitals:  ? 06/26/21 0455 06/26/21 1417  ?BP: 121/82 114/82  ?Pulse: 85 85  ?Resp: 17 19  ?Temp: 98.6 ?F (37 ?C) 98.9 ?F (37.2 ?C)  ?SpO2: 100% 99%  ?Given intracranial stenosis would allow BP to run upper normal 130-140s, will decrease Cozaar to 12.5mg  starting on 5/9- may need 2-3 d to equilibrate ?5/14 Will stop hydralazine today, BP slightly below goal overall ? ? ?11.  CKD stage III.  Follow-up  chemistries- ? ?  Latest Ref Rng & Units 06/21/2021  ?  5:48 AM 06/15/2021  ?  5:23 AM 06/14/2021  ?  6:57 PM  ?BMP  ?Glucose 70 - 99 mg/dL  115     ?BUN 6 - 20 mg/dL  35     ?Creatinine 0.61 - 1.24 mg/dL 1.34   1.37   1.39    ?Sodium 135 - 145 mmol/L  138     ?Potassium 3.5 -  5.1 mmol/L  4.4     ?Chloride 98 - 111 mmol/L  104     ?CO2 22 - 32 mmol/L  25     ?Calcium 8.9 - 10.3 mg/dL  9.6     ? ?BUN/Creat creeping up but GFR still normal , reducing Cozaar should help , enc po fluid  ?-if intake < 10105ml needs IV overnite  ?-Continue to encourage fluid intake ?-Appears well hydrated, likely drinking more than recorded, Recheck labs tomorrow ?12.  Hyperlipidemia.  continue Lipitor ? 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 -primarily oral ? -D2 thin diet ? -has good appetite, 5/14 ate all his breakfast, continue to follow ? ?LOS: ?12 days ?A FACE TO FACE EVALUATION WAS PERFORMED ? ?Jennye Boroughs ?06/26/2021, 2:45 PM  ? ? ? ?

## 2021-06-27 DIAGNOSIS — I63512 Cerebral infarction due to unspecified occlusion or stenosis of left middle cerebral artery: Secondary | ICD-10-CM | POA: Diagnosis not present

## 2021-06-27 LAB — BASIC METABOLIC PANEL
Anion gap: 9 (ref 5–15)
BUN: 26 mg/dL — ABNORMAL HIGH (ref 6–20)
CO2: 20 mmol/L — ABNORMAL LOW (ref 22–32)
Calcium: 9.6 mg/dL (ref 8.9–10.3)
Chloride: 109 mmol/L (ref 98–111)
Creatinine, Ser: 1.29 mg/dL — ABNORMAL HIGH (ref 0.61–1.24)
GFR, Estimated: >60 mL/min (ref >60)
Glucose, Bld: 115 mg/dL — ABNORMAL HIGH (ref 70–99)
Potassium: 4.4 mmol/L (ref 3.5–5.1)
Sodium: 138 mmol/L (ref 135–145)

## 2021-06-27 LAB — CBC WITH DIFFERENTIAL/PLATELET
Abs Immature Granulocytes: 0.01 10*3/uL (ref 0.00–0.07)
Basophils Absolute: 0 10*3/uL (ref 0.0–0.1)
Basophils Relative: 1 %
Eosinophils Absolute: 0.1 10*3/uL (ref 0.0–0.5)
Eosinophils Relative: 1 %
HCT: 40.3 % (ref 39.0–52.0)
Hemoglobin: 13.2 g/dL (ref 13.0–17.0)
Immature Granulocytes: 0 %
Lymphocytes Relative: 20 %
Lymphs Abs: 1.2 10*3/uL (ref 0.7–4.0)
MCH: 28.3 pg (ref 26.0–34.0)
MCHC: 32.8 g/dL (ref 30.0–36.0)
MCV: 86.3 fL (ref 80.0–100.0)
Monocytes Absolute: 0.5 10*3/uL (ref 0.1–1.0)
Monocytes Relative: 8 %
Neutro Abs: 4.3 10*3/uL (ref 1.7–7.7)
Neutrophils Relative %: 70 %
Platelets: 266 10*3/uL (ref 150–400)
RBC: 4.67 MIL/uL (ref 4.22–5.81)
RDW: 13.8 % (ref 11.5–15.5)
WBC: 6 10*3/uL (ref 4.0–10.5)
nRBC: 0 % (ref 0.0–0.2)

## 2021-06-27 NOTE — Progress Notes (Signed)
Occupational Therapy Session Note ? ?Patient Details  ?Name: Henry Simmons ?MRN: [id number] ?Date of Birth: [birth year] ? ?Today's Date: 06/27/2021 ?OT Individual Time: 1100-1155 ?OT Individual Time Calculation (min): 55 min  ? ? ?Short Term Goals: ?Week 2:  OT Short Term Goal 1 (Week 2): Pt will complete toilet transfer with max A and LRAD. ?OT Short Term Goal 2 (Week 2): Pt will complete ADL task EOB with no more than min A for balance. ?OT Short Term Goal 3 (Week 2): Pt will complete standing grooming task at sink with mod A of 2. ?OT Short Term Goal 4 (Week 2): Pt will don pants with max A. ? ?Skilled Therapeutic Interventions/Progress Updates:  ?  Patient received up in wheelchair fully dressed.  Patient transported to gym for skilled OT intervention to address functional transitional movements, transfers, balance, and neuromuscular reeducation.  Patient remains globally aphasic and apraxic.  Worked to improve midline orientation in sitting and in standing.  Patient moves fast and then has loss of balance  or pushes to right. Patient pushes to right in transition to stand, and places left foot well out of base of support.  Worked to align base of support, and maintain trunk in midline with weight shift forward to stand, de-emphasizing the need to pull to stand with left hand.  Once in standing position, worked to maintain weight through BLE, maintain hip and knee/ and trunk extension, and even weight shift toward RLE as needed for LB dressing and bathing.   ?Transitioned to supine toward right side - worked on bridging to assist with dressing, hygiene as needed.  PROM to RUE.  Increased muscle tension noted in R elbow flexors.  Unable to elicit any volitional movement in this position.   ?Worked on level surface transfer L/R staying in squat position.  Max assist to move toward right, mod assist toward left.   ?Patient transported to room, safety belt in place and engaged, call bell in lap, half desk tray on  wheelchair for RUE support.   ? ?Therapy Documentation ?Precautions:  ?Precautions ?Precaution Comments: R hemiparesis, apraxic, global aphasia, diplopia ?Restrictions ?Weight Bearing Restrictions: No ?RLE Weight Bearing: Non weight bearing ? ?  ?Pain: ?Pain Assessment ?Pain Scale: 0-10 ?Pain Score: 0-No pain ? ? ? ? ? ?Therapy/Group: Individual Therapy ? ?Mariah Milling ?06/27/2021, 12:01 PM ?

## 2021-06-27 NOTE — Progress Notes (Signed)
?                                                       PROGRESS NOTE ? ? ?Subjective/Complaints: ? ?Aphasic/apraxic ?Drinks fluid without cough but cannot phonate to command  ? ?ROS: Limited due to language/communication  ? ? ?Objective: ?  ?No results found. ?Recent Labs  ?  06/27/21 ?0751  ?WBC 6.0  ?HGB 13.2  ?HCT 40.3  ?PLT 266  ? ?No results for input(s): NA, K, CL, CO2, GLUCOSE, BUN, CREATININE, CALCIUM in the last 72 hours. ? ?Intake/Output Summary (Last 24 hours) at 06/27/2021 0903 ?Last data filed at 06/27/2021 0830 ?Gross per 24 hour  ?Intake 600 ml  ?Output --  ?Net 600 ml  ? ?  ? ?  ? ?Physical Exam: ?Vital Signs ?Blood pressure 107/81, pulse 90, temperature 98.8 ?F (37.1 ?C), temperature source Oral, resp. rate 18, height 6\' 2"  (1.88 m), weight 78.7 kg, SpO2 98 %. ? ?General: No acute distress ?Mood and affect are appropriate ?Heart: Regular rate and rhythm no rubs murmurs or extra sounds ?Lungs: Clear to auscultation, breathing unlabored, no rales or wheezes ?Abdomen: Positive bowel sounds, soft nontender to palpation, nondistended ?Extremities: No clubbing, cyanosis, or edema ?Skin: No evidence of breakdown, no evidence of rash ? ? ?Neurologic: globally aphasic. But able to close eyes to command , imitates gestures but perseverates, unable to perform body part ID  ?Cranial nerves II through XII intact, motor strength is 5/5 in left and 0/5 right deltoid, bicep, tricep, grip, hip flexor, knee extensors, ankle dorsiflexor and plantar flexor ?Sensory exam cannot assess due to aphasia  ? ?Musculoskeletal: No pain with RUE or LE PROM or LUE, LLE AROM . ? ? ?Assessment/Plan: ?1. Functional deficits which require 3+ hours per day of interdisciplinary therapy in a comprehensive inpatient rehab setting. ?Physiatrist is providing close team supervision and 24 hour management of active medical problems listed below. ?Physiatrist and rehab team continue to assess barriers to discharge/monitor patient progress  toward functional and medical goals ? ?Care Tool: ? ?Bathing ?   ?Body parts bathed by patient: Chest, Abdomen, Right arm, Right upper leg, Left upper leg, Face  ? Body parts bathed by helper: Left arm, Right lower leg, Left lower leg, Buttocks, Front perineal area ?  ?  ?Bathing assist Assist Level: Moderate Assistance - Patient 50 - 74% ?  ?  ?Upper Body Dressing/Undressing ?Upper body dressing   ?What is the patient wearing?: Pull over shirt ?   ?Upper body assist Assist Level: Maximal Assistance - Patient 25 - 49% ?   ?Lower Body Dressing/Undressing ?Lower body dressing ? ? ?   ?What is the patient wearing?: Incontinence brief, Pants ? ?  ? ?Lower body assist Assist for lower body dressing: Maximal Assistance - Patient 25 - 49% ?   ? ?Toileting ?Toileting    ?Toileting assist Assist for toileting: Moderate Assistance - Patient 50 - 74% ?  ?  ?Transfers ?Chair/bed transfer ? ?Transfers assist ?   ? ?Chair/bed transfer assist level: Moderate Assistance - Patient 50 - 74% ?  ?  ?Locomotion ?Ambulation ? ? ?Ambulation assist ? ? Ambulation activity did not occur: Safety/medical concerns ? ?  ?  ?   ? ?Walk 10 feet activity ? ? ?Assist ? Walk 10 feet activity did not  occur: Safety/medical concerns ? ?  ?   ? ?Walk 50 feet activity ? ? ?Assist Walk 50 feet with 2 turns activity did not occur: Safety/medical concerns ? ?  ?   ? ? ?Walk 150 feet activity ? ? ?Assist Walk 150 feet activity did not occur: Safety/medical concerns ? ?  ?  ?  ? ?Walk 10 feet on uneven surface  ?activity ? ? ?Assist Walk 10 feet on uneven surfaces activity did not occur: Safety/medical concerns ? ? ?  ?   ? ?Wheelchair ? ? ? ? ?Assist Is the patient using a wheelchair?: Yes ?Type of Wheelchair: Manual ?  ? ?Wheelchair assist level: Maximal Assistance - Patient 25 - 49% ?Max wheelchair distance: 150  ? ? ?Wheelchair 50 feet with 2 turns activity ? ? ? ?Assist ? ?  ?  ? ? ?Assist Level: Maximal Assistance - Patient 25 - 49%  ? ?Wheelchair 150  feet activity  ? ? ? ?Assist ?   ? ? ?Assist Level: Maximal Assistance - Patient 25 - 49%  ? ?Blood pressure 107/81, pulse 90, temperature 98.8 ?F (37.1 ?C), temperature source Oral, resp. rate 18, height 6\' 2"  (1.88 m), weight 78.7 kg, SpO2 98 %. ? ?Medical Problem List and Plan: ?1. 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            -patient may  shower ?            -ELOS/Goals: Expected discharge 07/07/21 min A/Mod A for SLP,PT and OT Family asked about planned DC date ? -Continue CIR therapies including PT, OT, and SLP  ? -WC mobility 250 ft with min supervision and hemi technique ?2.  Antithrombotics: ?-DVT/anticoagulation:  Pharmaceutical: Lovenox ?            -antiplatelet therapy: Aspirin 81 mg daily and Plavix 75 mg daily ?3. Pain Management: Tylenol as needed ? -Not requiring frequent tylenol use, continue to monitor ?4. Mood: Provide emotional support ?            -antipsychotic agents: N/A ?5. Neuropsych: This patient is not capable of making decisions on his own behalf. ?6. Skin/Wound Care: Routine skin checks ?7. Fluids/Electrolytes/Nutrition: Routine in and outs with follow-up chemistries ?8.  CAD/LAD stent/non-STEMI.  Continue aspirin and Plavix.  Follow-up per cardiology services ?9.  Hypertrophic cardiomyopathy/bradycardia.  Initially with temporary pacer.  No current plan for permanent pacemaker.  Continue Jardiance 10 mg daily. ?10.  Hypertension.  continue Norvasc 2.5 mg daily, hydralazine 10 mg twice daily, Isordil 10 mg twice daily,Lopressor 12.5 mg BID, Cozaar 100 mg daily. Monitor with increased mobility ?Vitals:  ? 06/26/21 1943 06/27/21 0420  ?BP: (!) 139/98 107/81  ?Pulse: 86 90  ?Resp: 14 18  ?Temp: 98.2 ?F (36.8 ?C) 98.8 ?F (37.1 ?C)  ?SpO2: 100% 98%  ?Given intracranial stenosis would allow BP to run upper normal 130-140s, will decrease Cozaar to 12.5mg  starting on  5/9- may need 2-3 d to equilibrate ?5/14 Will stop hydralazine today, BP slightly below goal overall ? ? ?11.  CKD stage III.  Follow-up chemistries- ? ?  Latest Ref Rng & Units 06/21/2021  ?  5:48 AM 06/15/2021  ?  5:23 AM 06/14/2021  ?  6:57 PM  ?BMP  ?Glucose 70 - 99 mg/dL  115     ?BUN 6 - 20 mg/dL  35     ?Creatinine  0.61 - 1.24 mg/dL 1.34   1.37   1.39    ?Sodium 135 - 145 mmol/L  138     ?Potassium 3.5 - 5.1 mmol/L  4.4     ?Chloride 98 - 111 mmol/L  104     ?CO2 22 - 32 mmol/L  25     ?Calcium 8.9 - 10.3 mg/dL  9.6     ? ?BUN/Creat creeping up but GFR still normal , reducing Cozaar should help , enc po fluid  ?-if intake < 1014ml needs IV overnite  ?-Continue to encourage fluid intake ?-Appears well hydrated, likely drinking more than recorded, Recheck labs tomorrow ?12.  Hyperlipidemia.  continue Lipitor ? 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 -primarily oral ? -D2 thin diet- no cough with liquid  ? -has good appetite, 5/14 ate all his breakfast, continue to follow ? ?LOS: ?13 days ?A FACE TO FACE EVALUATION WAS PERFORMED ? ?Luanna Salk Gissela Bloch ?06/27/2021, 9:03 AM  ? ? ? ?

## 2021-06-27 NOTE — Progress Notes (Signed)
Physical Therapy Session Note ? ?Patient Details  ?Name: Henry Simmons ?MRN: [id number] ?Date of Birth: 12/12/1970 ? ?Today's Date: 06/27/2021 ?PT Individual Time: 2751-7001 ?PT Individual Time Calculation (min): 54 min  ? ?Short Term Goals: ?Week 1:  PT Short Term Goal 1 (Week 1): pt will transfer to [facility] with mod assist ?PT Short Term Goal 1 - Progress (Week 1): Met ?PT Short Term Goal 2 (Week 1): Pt will ambulate 64f with max assist and LRAD ?PT Short Term Goal 2 - Progress (Week 1): Met ?PT Short Term Goal 3 (Week 1): Pt pt will propell WC 1092fwith min assist ?PT Short Term Goal 3 - Progress (Week 1): Met ?Week 2:  PT Short Term Goal [facility] (Week 2): [facility] ?PT Short Term Goal 2 (Week 2): Pt will propell WC 15025fith min assist ?PT Short Term Goal 3 (Week 2): Pt will ambulate 14f58fth LRAD and max assist +2 consistently ? ?Skilled Therapeutic Interventions/Progress Updates:  ?Patient supine in bed on entrance to room. Patient alert and agreeable to PT session.  ? ?Patient with no pain complaint throughout session. ? ?Therapeutic Activity: ?Bed Mobility: Patient performed supine --> sit with Min/ ModA for RLE. And trunk activation to bring off EOB. Upon reaching, pt able to push self up to upright seated position with CGA.  VC/ tc required for technique to use trunk for bringing RLE around. To EOB. R AFO and shoes donned with MaxA.  ?Transfers: Squat pivot transfer bed <> w/c with Min/ ModA and instructions provided prior to reduce impulsivity.  Patient performed sit<>stand transfers with MinA to reach upright standing balance with LUE support. Stand pivot transfers throughout session with Max A for pivot stepping and RLE progression. Provided verbal cues for technique throughout. ? ?Gait Training:  ?Patient ambulated 38 ft x3 using L hallway HR with MaxA for RLE advancement and +2 for w/c follow with add'l tc. Demonstrated intermittent RLE flexion in stance with activation of LLE for  swing phase. With Max A knee block to RLE immediately upon heel strike, pt's R knee reaches full extension and easier to maintain during proper stance phase. Requires assist for weight shifting, sequence of stepping, upright posture, hip extension. Provided vc/ tc throughout. ? ?Neuromuscular Re-ed: ?NMR facilitated during session with focus on standing balance, muscle fiber recruitment and activation. Pt guided in sit<>stand to LUE support and guided in acquiring upright posture to midline and to maintain. Pt also encouraged throughout session to verbally relate different words instead of head nods and L hand raise. With max encouragement at end of session, is able to whisper "three" indicating number of times pt walked this session. NMR performed for improvements in motor control and coordination, balance, sequencing, judgement, and self confidence/ efficacy in performing all aspects of mobility at highest level of independence.  ? ?Patient supine  in bed at end of session with brakes locked, bed alarm set, and all needs within reach. Wife present. ? ? ?Therapy Documentation ?Precautions:  ?Precautions ?Precaution Comments: R hemiparesis, apraxic, global aphasia, diplopia ?Restrictions ?Weight Bearing Restrictions: No ?RLE Weight Bearing: Non weight bearing ?General: ?  ?Vital Signs: ?  ?Pain: ?  ? ?Therapy/Group: Individual Therapy ? ?[name]15/2023, 10:34 AM  ?

## 2021-06-27 NOTE — Progress Notes (Addendum)
Speech Language Pathology Daily Session Note ? ?Patient Details  ?Name: RICHY SPRADLEY ?MRN: [id number] ?Date of Birth: September 17, 1970 ? ?Today's Date: 06/27/2021 ?SLP Individual Time: 4098-1191 ?SLP Individual Time Calculation (min): 60 min ? ?Short Term Goals: ?Week 2: SLP Short Term Goal 1 (Week 2): Pt will answer basic yes/no questions with 70% accuracy via multimodal means with Max A. - <50% accuracy via multimodal means with Max A  faded to Min-Mod A for motor planning head shake for "no" responses. ? ?SLP Short Term Goal 2 (Week 2): Pt will follow one-step commands within context of functional task on 50% of trials given Min A. - ~50% of trials given Min-Mod A. ? ?SLP Short Term Goal 3 (Week 2): Pt will complete receptive ID of common + functional words with 80% accuracy given Min A. - 50% accuracy given Min A. ? ?SLP Short Term Goal 4 (Week 2): Pt will vocalize/phonation x 1 given Max A. - x 1 independently (automatic in nature) + low vocal intensity.  ? ?SLP Short Term Goal 5 (Week 2): Pt will utilize multimodal communication methods to communicate wants, needs, thoughts, and ideas x 10 with written choice prompts. - x 5 with use of written choice prompts and hand gestures. ? ?SLP Short Term Goal 6 (Week 2): Will consume trials of Dysphagia 2 textures with minimal oral residuals and no evidence of aspiration given Min A for safe swallow strategies. - Pt with intermittent cough post-prandially and without PO intake this date + belching; no coughing with liquids. Required biofeedback via mirror and visual cues to correct anterior oral spillage and reduced oral residuals. Alternated bites/sips with Sup A faded to Mod I.   ? ?Skilled Therapeutic Interventions: ?Pt seen this date for skilled ST intervention targeting dysphagia and communication goals outlined above. Pt received awake/alert and lying flat in bed. Decreased affect this session. Agreeable to ST intervention.  ?  ?SLP facilitated today's session by  continuing to provide and reinforce the following ST interventions during functional and therapeutic tx tasks: supported communication interventions (written binary choice prompts, picture cues, gesture use, establishment + reinforcement of yes/no system (head side to side for "no" and thumbs up for "yes" responses + low-tech AAC board), simple language, Max to Total multimodal A for expression of wants/needs, Min A for adherence to aspiration precautions, biofeedback with use of mirror for awareness of anterior oral spillage and oral residuals, and tactile cueing + model prompts/demonstration for motor planning/execution. ?  ?Continues to demonstrate stimulability for skilled interventions as evident by ongoing subtle improvement in basic auditory comprehension, use of basic yes/no system, and written + verbal choice prompts on whiteboard for functional communication. Please see above for objective data collected during today's session.  ?  ?Pt left in bed with bed alarm on, call bell within reach, and all immediate needs met. Continue per ST POC.  ? ?Pain ?Pt denied pain when provided with written communication supports; NAD ? ?Therapy/Group: Individual Therapy ? ?Kashon Kraynak A Shaylea Ucci ?06/27/2021, 12:15 PM ?

## 2021-06-27 NOTE — Progress Notes (Signed)
Physical Therapy Session Note ? ?Patient Details  ?Name: Henry Simmons ?MRN: [id number] ?Date of Birth: [birth year] ? ?Today's Date: 06/27/2021 ?PT Individual Time: 4327-6147 ?PT Individual Time Calculation (min): 28 min  ? ?Short Term Goals: ?Week 2:  PT Short Term Goal 1 (Week 2): Pt will trasnfer to [facility] with min assist ?PT Short Term Goal 2 (Week 2): Pt will propell WC 121ft with min assist ?PT Short Term Goal 3 (Week 2): Pt will ambulate 97ft with LRAD and max assist +2 consistently ? ?Skilled Therapeutic Interventions/Progress Updates:  ?   ?Pt seen supine in bed to start - in hospital gown. Appears agreeable to PT tx with aphasia limiting. Donned pants with maxA for threading and pulling up in standing. Supine<>sitting EOB with modA with use of bed features, assist primarily for RLE management and a little trunk support. Sitting balance close supervision with R lean noted - corrects with UE support to hand rail. TED's, shoes, and R GRAFO donned with totalA for time management. He requires minA for donning shirt due to absent hemi strategies despite cues. Requires modA for sit<>stand and maxA for standing balance due to R lean, pushing. Squat<>pivot transfer completed with modA from EOB to w/c, towards his paretic side. Able to reposition self with tactile cues. Pt appears wanting to propel himself in w/c, using his stronger LUE only; therefore, worked remainder of session on hemi propulsion in w/c using L side - requires max cues and benefited from demonstration to improve learning in order to incorporate his L leg. Able to propel short distances with supervision but requires modA for navigating obstacles and will drift to his R quickly. Pt concluded session seated in w/c with safety belt alarm on, call bell in reach, Direct handoff care to NT who was preparing his bed with new linen. LPN also made aware of pt position for morning rx.  ? ?Therapy Documentation ?Precautions:  ?Precautions ?Precaution Comments: R  hemiparesis, apraxic, global aphasia, diplopia ?Restrictions ?Weight Bearing Restrictions: No ?RLE Weight Bearing: Non weight bearing ?General: ?  ? ?Therapy/Group: Individual Therapy ? ?Amarrah Meinhart P Geovanie Winnett ?06/27/2021, 7:38 AM  ?

## 2021-06-28 DIAGNOSIS — I63512 Cerebral infarction due to unspecified occlusion or stenosis of left middle cerebral artery: Secondary | ICD-10-CM | POA: Diagnosis not present

## 2021-06-28 LAB — CREATININE, SERUM
Creatinine, Ser: 1.4 mg/dL — ABNORMAL HIGH (ref 0.61–1.24)
GFR, Estimated: >60 mL/min (ref >60)

## 2021-06-28 NOTE — Progress Notes (Signed)
Speech Language Pathology Daily Session Note ? ?Patient Details  ?Name: Henry Simmons ?MRN: [id number] ?Date of Birth: January 31, 1971 ? ?Today's Date: 06/28/2021 ?SLP Individual Time: 1300-1400 ?SLP Individual Time Calculation (min): 60 min ? ?Short Term Goals: ?Week 2: SLP Short Term Goal 1 (Week 2): Pt will answer basic yes/no questions with 70% accuracy via multimodal means with Max A. ?SLP Short Term Goal 2 (Week 2): Pt will follow one-step commands within context of functional task on 50% of trials given Min A. ?SLP Short Term Goal 3 (Week 2): Pt will complete receptive ID of common + functional words with 80% accuracy given Min A. ?SLP Short Term Goal 4 (Week 2): Pt will vocalize/phonation x 1 given Max A. ?SLP Short Term Goal 5 (Week 2): Pt will utilize multimodal communication methods to communicate wants, needs, thoughts, and ideas x 10 with written choice prompts. ?SLP Short Term Goal 6 (Week 2): Will consume trials of Dysphagia 3 textures with minimal oral residuals and no evidence of aspiration given Min A for safe swallow strategies. ? ?Skilled Therapeutic Interventions: Skilled ST treatment focused on language goals. Pt performed sit-to-stand using [place] with min A to stand with substantial support needed due to heavy right lean. Pt transferred to [facility] for duration of session. SLP facilitated session by providing min-to-mod A for ID of functional written words in field of 2 with 58% accuracy; 33% accuracy in field of 3; 0% accuracy in field of 4. Pt followed basic 1 step commands with 20% accuracy given no cues, and 60% accuracy with mod A demonstration cues. Suspect difficulties attributed to motor planning deficits. Patient selected field of 2 objects (fruit) with 71% accuracy with min-to-mod A verbal + written cues. Pt unable to vocalize despite max A multimodal cues and multiple attempts. Patient was left in [facility] with alarm activated and immediate needs within reach at end of session. Continue per current  plan of care.   ?   ?Pain ? No pain behaviors observed. ? ?Therapy/Group: Individual Therapy ? ?Anacaren Kohan T Teyah Rossy ?06/28/2021, 5:40 PM ?

## 2021-06-28 NOTE — Progress Notes (Signed)
?                                                       PROGRESS NOTE ? ? ?Subjective/Complaints: ? ?Aphasic/apraxic ?Uses gestures but has difficulty in imitating gestures  ?ROS: Limited due to language/communication  ? ? ?Objective: ?  ?No results found. ?Recent Labs  ?  06/27/21 ?0751  ?WBC 6.0  ?HGB 13.2  ?HCT 40.3  ?PLT 266  ? ? ?Recent Labs  ?  06/27/21 ?0751 06/28/21 ?[id number]  ?NA 138  --   ?K 4.4  --   ?CL 109  --   ?CO2 20*  --   ?GLUCOSE 115*  --   ?BUN 26*  --   ?CREATININE 1.29* 1.40*  ?CALCIUM 9.6  --   ? ? ?Intake/Output Summary (Last 24 hours) at 06/28/2021 0813 ?Last data filed at 06/27/2021 1849 ?Gross per 24 hour  ?Intake 640 ml  ?Output 0 ml  ?Net 640 ml  ? ?  ? ?  ? ?Physical Exam: ?Vital Signs ?Blood pressure 123/86, pulse 82, temperature 98.2 ?F (36.8 ?C), resp. rate 17, height 6\' 2"  (1.88 m), weight 77 kg, SpO2 100 %. ? ?General: No acute distress ?Mood and affect are appropriate ?Heart: Regular rate and rhythm no rubs murmurs or extra sounds ?Lungs: Clear to auscultation, breathing unlabored, no rales or wheezes ?Abdomen: Positive bowel sounds, soft nontender to palpation, nondistended ?Extremities: No clubbing, cyanosis, or edema ?Skin: No evidence of breakdown, no evidence of rash ? ? ?Neurologic: globally aphasic. But able to close eyes to command , imitates gestures but perseverates, unable to perform body part ID  ?Cranial nerves II through XII intact, motor strength is 5/5 in left and 0/5 right deltoid, bicep, tricep, grip, hip flexor, knee extensors, ankle dorsiflexor and plantar flexor ?Sensory exam cannot assess due to aphasia  ? ?Musculoskeletal: No pain with RUE or LE PROM or LUE, LLE AROM . ? ? ?Assessment/Plan: ?1. Functional deficits which require 3+ hours per day of interdisciplinary therapy in a comprehensive inpatient rehab setting. ?Physiatrist is providing close team supervision and 24 hour management of active medical problems listed below. ?Physiatrist and rehab team continue  to assess barriers to discharge/monitor patient progress toward functional and medical goals ? ?Care Tool: ? ?Bathing ?   ?Body parts bathed by patient: Chest, Abdomen, Right arm, Right upper leg, Left upper leg, Face  ? Body parts bathed by helper: Left arm, Right lower leg, Left lower leg, Buttocks, Front perineal area ?  ?  ?Bathing assist Assist Level: Moderate Assistance - Patient 50 - 74% ?  ?  ?Upper Body Dressing/Undressing ?Upper body dressing   ?What is the patient wearing?: Pull over shirt ?   ?Upper body assist Assist Level: Maximal Assistance - Patient 25 - 49% ?   ?Lower Body Dressing/Undressing ?Lower body dressing ? ? ?   ?What is the patient wearing?: Incontinence brief, Pants ? ?  ? ?Lower body assist Assist for lower body dressing: Maximal Assistance - Patient 25 - 49% ?   ? ?Toileting ?Toileting    ?Toileting assist Assist for toileting: Moderate Assistance - Patient 50 - 74% ?  ?  ?Transfers ?Chair/bed transfer ? ?Transfers assist ?   ? ?Chair/bed transfer assist level: Moderate Assistance - Patient 50 - 74% ?  ?  ?Locomotion ?  Ambulation ? ? ?Ambulation assist ? ? Ambulation activity did not occur: Safety/medical concerns ? ?  ?  ?   ? ?Walk 10 feet activity ? ? ?Assist ? Walk 10 feet activity did not occur: Safety/medical concerns ? ?  ?   ? ?Walk 50 feet activity ? ? ?Assist Walk 50 feet with 2 turns activity did not occur: Safety/medical concerns ? ?  ?   ? ? ?Walk 150 feet activity ? ? ?Assist Walk 150 feet activity did not occur: Safety/medical concerns ? ?  ?  ?  ? ?Walk 10 feet on uneven surface  ?activity ? ? ?Assist Walk 10 feet on uneven surfaces activity did not occur: Safety/medical concerns ? ? ?  ?   ? ?Wheelchair ? ? ? ? ?Assist Is the patient using a wheelchair?: Yes ?Type of Wheelchair: Manual ?  ? ?Wheelchair assist level: Maximal Assistance - Patient 25 - 49% ?Max wheelchair distance: 150  ? ? ?Wheelchair 50 feet with 2 turns activity ? ? ? ?Assist ? ?  ?  ? ? ?Assist Level:  Maximal Assistance - Patient 25 - 49%  ? ?Wheelchair 150 feet activity  ? ? ? ?Assist ?   ? ? ?Assist Level: Maximal Assistance - Patient 25 - 49%  ? ?Blood pressure 123/86, pulse 82, temperature 98.2 ?F (36.8 ?C), resp. rate 17, height 6\' 2"  (1.88 m), weight 77 kg, SpO2 100 %. ? ?Medical Problem List and Plan: ?1. 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            -patient may  shower ?            -ELOS/Goals: Expected discharge 07/07/21 min A/Mod A for SLP,PT and OT team conf in am  ? -Continue CIR therapies including PT, OT, and SLP  ? -WC mobility 250 ft with min supervision and hemi technique ?2.  Antithrombotics: ?-DVT/anticoagulation:  Pharmaceutical: Lovenox ?            -antiplatelet therapy: Aspirin 81 mg daily and Plavix 75 mg daily ?3. Pain Management: Tylenol as needed ? -Not requiring frequent tylenol use, continue to monitor ?4. Mood: Provide emotional support ?            -antipsychotic agents: N/A ?5. Neuropsych: This patient is not capable of making decisions on his own behalf. ?6. Skin/Wound Care: Routine skin checks ?7. Fluids/Electrolytes/Nutrition: Routine in and outs with follow-up chemistries ?8.  CAD/LAD stent/non-STEMI.  Continue aspirin and Plavix.  Follow-up per cardiology services ?9.  Hypertrophic cardiomyopathy/bradycardia.  Initially with temporary pacer.  No current plan for permanent pacemaker.  Continue Jardiance 10 mg daily. ?10.  Hypertension.  continue Norvasc 2.5 mg daily, hydralazine 10 mg twice daily, Isordil 10 mg twice daily,Lopressor 12.5 mg BID, Cozaar 100 mg daily. Monitor with increased mobility ?Vitals:  ? 06/27/21 1948 06/28/21 0434  ?BP: 118/84 123/86  ?Pulse: 88 82  ?Resp: 16 17  ?Temp: 99.1 ?F (37.3 ?C) 98.2 ?F (36.8 ?C)  ?SpO2: 99% 100%  ?Given intracranial stenosis would allow BP to run upper normal 130-140s, will decrease Cozaar to 12.5mg   starting on 5/9- may need 2-3 d to equilibrate ?5/14 Will stop hydralazine today, BP slightly below goal overall ?5/16 d/c cozaar , creat creeping up  ? ?11.  CKD stage III.  Follow-up chemistries- ? ?  Latest Ref Rng & Units 06/28/2021  ?  5:47  AM 06/27/2021  ?  7:51 AM 06/21/2021  ?  5:48 AM  ?BMP  ?Glucose 70 - 99 mg/dL  115     ?BUN 6 - 20 mg/dL  26     ?Creatinine 0.61 - 1.24 mg/dL 1.40   1.29   1.34    ?Sodium 135 - 145 mmol/L  138     ?Potassium 3.5 - 5.1 mmol/L  4.4     ?Chloride 98 - 111 mmol/L  109     ?CO2 22 - 32 mmol/L  20     ?Calcium 8.9 - 10.3 mg/dL  9.6     ? ?BUN/Creat creeping up but GFR still normal , reducing Cozaar should help , enc po fluid  ?-if intake < 1024ml needs IV overnite  ?-Continue to encourage fluid intake ?-d/c cozaar  ?12.  Hyperlipidemia.  continue Lipitor ? 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 -primarily oral ? -D2 thin diet- no cough with liquid  ? -has good appetite, 5/14 ate all his breakfast, continue to follow ? ?LOS: ?14 days ?A FACE TO FACE EVALUATION WAS PERFORMED ? ?Luanna Salk Lauretta Sallas ?06/28/2021, 8:13 AM  ? ? ? ?

## 2021-06-28 NOTE — Progress Notes (Signed)
Physical Therapy Session Note ? ?Patient Details  ?Name: Henry Simmons ?MRN: [id number] ?Date of Birth: 1970-09-20 ? ?Today's Date: 06/28/2021 ?PT Individual Time: 8832-5498 ?PT Individual Time Calculation (min): 84 min  ? ?Short Term Goals: ?Week 1:  PT Short Term Goal 1 (Week 1): pt will transfer to [facility] with mod assist ?PT Short Term Goal 1 - Progress (Week 1): Met ?PT Short Term Goal 2 (Week 1): Pt will ambulate 88f with max assist and LRAD ?PT Short Term Goal 2 - Progress (Week 1): Met ?PT Short Term Goal 3 (Week 1): Pt pt will propell WC 1015fwith min assist ?PT Short Term Goal 3 - Progress (Week 1): Met ? ?Skilled Therapeutic Interventions/Progress Updates:  ?Patient seated upright in w/c on entrance to room. Patient alert and agreeable to PT session.  ? ?Patient with no pain complaint throughout session. ? ?At start of session, recline back w/c located for pt for improved fit of pt's height and push back into back of w/c.  ? ?Therapeutic Activity: ?Transfers: Patient performed sit<>stand transfers using LHR in hallway with CGA to power up and up to Mod A to maintain standing balance. Stand pivot transfers including toilet transfer require MaxA for pivot stepping with LLE and vc/ tc for sequencing steps and overall technique. Squat transfer with max cues for technique prior to performance w/c --> mat table --> recline back w/c. All with ModA mostly for positioning. ? ?Toilet transfer performed with stand pivot at toilet. Max A for RLE block to prevent buckling and max cueing for sequencing of steps for pivot to sit at toilet. Max/ TotA for change of brief and pants. Sit to stand at toilet in order to don pants with MaxA as pt tending to attempt to manage clothing with LUE. Squat pivot from toilet to w/c with modA.  ? ?Gait Training:  ?Pt with R AFO and leg lifter donned to RLE. Patient ambulated 3856x4 using LHR with Max A for RLE advancement and knee block into extension. Demonstrated improved speed of  coordinated movement and reciprocal stepping this day. Continues to require immediate block into extension just prior to stance on RLE in order to decrease flexor tone during advancement of LLE. Provided vc/ tc for weight shift, sequencing of LE advancement, . Also attempt of amb with EVA walker this day with pt pushing into L elbow  and causing huge lean/ push to R. Requires MaxA +2 to manage EVA walker and pt's RLE advancement safely.  ? ?Pt with incontinent episode in hallway outside of therapy gym upon standing for more ambulation bouts, but assisted pt to sit in w/c and then taken to bathroom to toilet and change.  ? ?Neuromuscular Re-ed: ?NMR facilitated during session with focus on sitting and standing balance. Pt guided in upright . NMR performed for improvements in motor control and coordination, balance, sequencing, judgement, and self confidence/ efficacy in performing all aspects of mobility at highest level of independence.  ? ?Patient seated upright in recline back w/c at end of session with brakes locked, belt alarm set, and all needs within reach. Son in room.  ? ? ?Therapy Documentation ?Precautions:  ?Precautions ?Precaution Comments: R hemiparesis, apraxic, global aphasia, diplopia ?Restrictions ?Weight Bearing Restrictions: No ?RLE Weight Bearing: Weight bearing as tolerated ?General: ?  ?Vital Signs: ?  ?Pain: ? No pain complaint this session.  ? ?Therapy/Group: Individual Therapy ? ?[name], DPT ?06/28/2021, 2:17 PM  ?

## 2021-06-28 NOTE — Plan of Care (Signed)
?  Problem: Consults ?Goal: RH STROKE PATIENT EDUCATION ?Description: See Patient Education module for education specifics  ?Outcome: Progressing ?  ?Problem: RH BOWEL ELIMINATION ?Goal: RH STG MANAGE BOWEL WITH ASSISTANCE ?Description: STG Manage Bowel with mod I Assistance. ?Outcome: Progressing ?Goal: RH STG MANAGE BOWEL W/MEDICATION W/ASSISTANCE ?Description: STG Manage Bowel with Medication with mod I Assistance. ?Outcome: Progressing ?  ?Problem: RH BLADDER ELIMINATION ?Goal: RH STG MANAGE BLADDER WITH ASSISTANCE ?Description: STG Manage Bladder With toileting Assistance ?Outcome: Progressing ?  ?Problem: RH SAFETY ?Goal: RH STG ADHERE TO SAFETY PRECAUTIONS W/ASSISTANCE/DEVICE ?Description: STG Adhere to Safety Precautions With cues Assistance/Device. ?Outcome: Progressing ?  ?Problem: RH PAIN MANAGEMENT ?Goal: RH STG PAIN MANAGED AT OR BELOW PT'S PAIN GOAL ?Description: At or below level 4 with prns ?Outcome: Progressing ?  ?Problem: RH KNOWLEDGE DEFICIT ?Goal: RH STG INCREASE KNOWLEDGE OF HYPERTENSION ?Description: Patient and spouse will be able to manage HTN with medications and dietary modifications using handouts and educational resources independently ?Outcome: Progressing ?Goal: RH STG INCREASE KNOWLEGDE OF HYPERLIPIDEMIA ?Description: Patient and spouse will be able to manage HLD with medications and dietary modifications using handouts and educational resources independently ?Outcome: Progressing ?Goal: RH STG INCREASE KNOWLEDGE OF STROKE PROPHYLAXIS ?Description: Patient and spouse will be able to manage secondary stroke risk with medications and dietary modifications using handouts and educational resources independently ?Outcome: Progressing ?  ?Problem: RH Vision ?Goal: RH LTG Vision (Specify) ?Outcome: Progressing ?  ?Problem: RH SKIN INTEGRITY ?Goal: RH STG MAINTAIN SKIN INTEGRITY WITH ASSISTANCE ?Description: STG Maintain Skin Integrity With Assistance. ?Outcome: Progressing ?  ?

## 2021-06-28 NOTE — Progress Notes (Signed)
Occupational Therapy Session Note ? ?Patient Details  ?Name: Henry Simmons ?MRN: [id number] ?Date of Birth: [birth year] ? ?Today's Date: 06/28/2021 ?OT Individual Time: 5885-0277 ?OT Individual Time Calculation (min): 70 min  ? ? ?Short Term Goals: ?Week 1:  OT Short Term Goal 1 (Week 1): Pt will complete ADL task EOB with no more than min A for balance. ?OT Short Term Goal 1 - Progress (Week 1): Not met ?OT Short Term Goal 2 (Week 1): Pt will don shirt in supported sitting with min A. ?OT Short Term Goal 2 - Progress (Week 1): Met ?OT Short Term Goal 3 (Week 1): Pt will complete grooming task with no more than min A. ?OT Short Term Goal 3 - Progress (Week 1): Met ?OT Short Term Goal 4 (Week 1): Pt will complete toilet transfer with max A and LRAD. ?OT Short Term Goal 4 - Progress (Week 1): Not met ?Week 2:  OT Short Term Goal 1 (Week 2): Pt will complete toilet transfer with max A and LRAD. ?OT Short Term Goal 2 (Week 2): Pt will complete ADL task EOB with no more than min A for balance. ?OT Short Term Goal 3 (Week 2): Pt will complete standing grooming task at sink with mod A of 2. ?OT Short Term Goal 4 (Week 2): Pt will don pants with max A. ? ?Skilled Therapeutic Interventions/Progress Updates:  ?  Session 1 [phone]): Pt received semi-reclined in bed, no s/sx pain throughout and appears, agreeable to therapy. Session focus on self-care retraining, activity tolerance, transfer retraining in prep for improved ADL/IADL/func mobility performance + decreased caregiver burden. ? ?Came to sitting EOB with light min A to progress RLE and to scoot R hip forward. Multiple slow LOB posteriorly when engaged with ADL, required CGA to consistent min A for balance. Doffed shirt with min A to pull over head. Bathed UB with min A to bathe LUE, bathed upper BLE with CGA for balance. Perseverates on washing face and requires tactile / verbal cues to move on to other body parts.  ? ?Donned button up shirt with mod A to thread RUE  and to button up. Donned brief/pants/teds/shoes/R AFO with overall max to total A, but pt able to assist threading LLE and pulling up pants over L hip with bridging. Attempted sit to stand with LUE support on bed rail but pt consistently losing balance posteriorly.  ? ?PROM to LUE with increased tone in biceps and digit/flexors, donned R wrist cock up brace to keep wrist in neutral position. Squat-pivot to his L with mod A to fully pivot over and +2 present to stabilize w/c, pt tends to come into standing quickly vs squatting/scooting over. ? ?Self-propelled w/c with overall mod A > Day room via hemi technique and intermittent hand over hand assist for LUE/LLE to initiate correction motion.  ? ?Massed practice of scooting/ squatting L/R up and down mat, min A to his L and heavier mod A to his R to progress RLE and to achieve more hips clearance off mat. Additionally, completed massed practice of sit > squat with max A of 2 to block RLE and to facilitate sufficient forward trunk flexion, assist to facilitate RUE WB on R thigh. Finally, 1x10 sit to stands with mod A of to with LUE support on back of chair and assist to block RLE. ? ?Pt left seated in recliner with wife present with safety belt alarm engaged, call bell in reach, and all immediate needs met.  ? ? ? ?  Therapy Documentation ?Precautions:  ?Precautions ?Precaution Comments: R hemiparesis, apraxic, global aphasia, diplopia ?Restrictions ?Weight Bearing Restrictions: No ?RLE Weight Bearing: Non weight bearing ? ?Pain: no s/sx throughout ?  ?ADL: See Care Tool for more details. ? ? ? ?Therapy/Group: Individual Therapy ? ?Volanda Napoleon MS, OTR/L ? ?06/28/2021, 6:55 AM ?

## 2021-06-29 DIAGNOSIS — I63512 Cerebral infarction due to unspecified occlusion or stenosis of left middle cerebral artery: Secondary | ICD-10-CM | POA: Diagnosis not present

## 2021-06-29 NOTE — Progress Notes (Signed)
Physical Therapy Session Note  Patient Details  Name: Henry Simmons MRN: [id number] Date of Birth: 09/17/1970  Today's Date: 06/29/2021 PT Individual Time: 7530-0511 PT Individual Time Calculation (min): 73 min   Short Term Goals: Week 1:  PT Short Term Goal 1 (Week 1): pt will transfer to [facility] with mod assist PT Short Term Goal 1 - Progress (Week 1): Met PT Short Term Goal 2 (Week 1): Pt will ambulate 16f with max assist and LRAD PT Short Term Goal 2 - Progress (Week 1): Met PT Short Term Goal 3 (Week 1): Pt pt will propell WC 1011fwith min assist PT Short Term Goal 3 - Progress (Week 1): Met Week 2:  PT Short Term Goal [facility] (Week 2): [facility] PT Short Term Goal 2 (Week 2): Pt will propell WC 15076fith min assist PT Short Term Goal 3 (Week 2): Pt will ambulate 31f5fth LRAD and max assist +2 consistently   Skilled Therapeutic Interventions/Progress Updates:   Pt received supine in bed and agreeable to PT. Supine>sit transfer with min assist and cues for attentino to the RLE to improve safety. cues    Stand [facility] mod assist to position RLE. Transported to rehab gym in WC. [facility] 3 musketeer x 110ft76fd forward reverse 15ft 27f.  R AFO and Max assist for weight shift to the L to allow advancement of the RLE and max assist to block the RLE in stance to prevent knee buckling.   WC mobility with hemi technique. X 200ft w46fsupervision assist and only moderate cues for sequencing of LUE/LLE to maintain straight path and then max cues for awareness of obstacles.   Stedy transfer to attempt toileting with min assist for sit<>stand in stedy. Max assist for clothing management to doff and don pants. Pt returned to room and performed stedy transfer to bed with min assist. Sit>supine completed with min assist on the RLE, and left supine in bed with call bell in reach and all needs met.  .      Therapy  Documentation Precautions:  Precautions Precaution Comments: R hemiparesis, apraxic, global aphasia, diplopia Restrictions Weight Bearing Restrictions: No RLE Weight Bearing: Weight bearing as tolerated    Pain:   Faces: none.  Therapy/Group: Individual Therapy  Tremell Reimers [name], 2:23 PM

## 2021-06-29 NOTE — Patient Care Conference (Signed)
Inpatient RehabilitationTeam Conference and Plan of Care Update Date: 06/29/2021   Time: 10:19 AM    Patient Name: Henry Simmons [facility]      Medical Record Number: [id number]  Date of Birth: [birth year] Sex: Male         Room/Bed: 4W24C/4W24C-01 Payor Info: Payor: Advertising copywriter / Plan: [employer] OTHER / Product Type: *No Product type* /    Admit Date/Time:  06/14/2021  6:16 PM  Primary Diagnosis:  Acute ischemic left MCA stroke [facility])  Hospital Problems: Principal Problem:   Acute ischemic left MCA stroke (HCC) Active Problems:   Right hemiplegia (HCC)   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)   Left middle cerebral artery stroke [facility])    Expected Discharge Date: Expected Discharge Date: 07/07/21  Team Members Present: Physician leading conference: Dr. Claudette Laws Social Worker Present: Cecile Sheerer, LCSWA Nurse Present: Chana Bode, RN PT Present: Grier Rocher, PT OT Present: Annye English, OT SLP Present: Eilene Ghazi, SLP PPS Coordinator present : Fae Pippin, SLP     Current Status/Progress Goal Weekly Team Focus  Bowel/Bladder   inc of b and b lbm 06/27/21  decrease inc episodes  time toilette   Swallow/Nutrition/ Hydration   Dysphagia 2 with thin liquids  Least restrictive diet with Min A for adherence to aspiration precautions, particularly oral holding and pocketing  Dysphagia 3 trials   ADL's   stedy of 1 > toilet, max A of 2 for toileting tasks, LBD, mod A for bathing at sink, mod A UBD, min A for oral care/self feeding, apraxia, developing tone R biceps, R digit flexors, no activiation noted so far  min to mod  RUE NMR, self-care/transfer/balance retraining, midline orientation, pt/family/DME/AE education   Mobility   Requires continued cues prior to moving d/t high impulsivity ::: Bed mobility = MinA; Squat pivot transfer to R = Min/ ModA, stand pivot to L = Mod/ MaxA; Ambulation = short distancebouts (~65ft) MaxA for knee block and RLE  advancement using L HR or 3-musketeer technique  Bed mobility at SUP level, Transfers at minA, Ambulation with modA, stairs with ModA, w/c mobility with SUP  continued gait training, sitting and standing balance, R attn, transfers to R and L, NMR for R hemibody, family education with wife and other available family   Communication   Mod-Max A for auditory comprehension and verbal expression  Mod A for auditory comprehension; Max A for verbal expression  yes/no responses, low-tech AAC, phonation, and command following   Safety/Cognition/ Behavioral Observations  Unable to target at this time due to severity of aphasia and apraxia; appears oriented x 3 with written choice prompts  N/A  N/A   Pain   no c/opain         Skin   no current skin issues           Discharge Planning:  Patient discharging home with spouse. Spouse works a few hours during the morning, family (sister, mother,etc) will assist when she is working.   Team Discussion: Patein twith aphasia and apraxia post left MCA CVA. Patient continues to have poor po intake; enc fluids to avoid IVF at HS. Remains incontinent; toileting protocol in place.  Presents with non fluent aphasia and limited verbal communication. Anticipate patient will be wheelchair level for discharge.  Patient on target to meet rehab goals: yes, currently needs max assist +2 for toileting, max assist for squat pivot transfers and using a stedy. Needs mod assist for seated bathing and upper body  care. Goals for discharge set for min - mod assist.  *See Care Plan and progress notes for long and short-term goals.   Revisions to Treatment Plan:  Multimodal communication, communication board  Hemi w/c propulsion Three musketeer style ambulation practice  Teaching Needs: Safety, nutrition, medication management, dietary modifcicatons, secondary risk management, transfers, etc  Current Barriers to Discharge: Decreased caregiver support and Home enviroment  access/layout  Possible Resolutions to Barriers: Family education     Medical Summary Current Status: poor appetite and oral fluid intake, BPs controlled, AKI  Barriers to Discharge: Medical stability   Possible Resolutions to Barriers/Weekly Focus: IVF if po intake <1076ml, work on toileting   Continued Need for Acute Rehabilitation Level of Care: The patient requires daily medical management by a physician with specialized training in physical medicine and rehabilitation for the following reasons: Direction of a multidisciplinary physical rehabilitation program to maximize functional independence : Yes Medical management of patient stability for increased activity during participation in an intensive rehabilitation regime.: Yes Analysis of laboratory values and/or radiology reports with any subsequent need for medication adjustment and/or medical intervention. : Yes   I attest that I was present, lead the team conference, and concur with the assessment and plan of the team.   Chana Bode B 06/29/2021, 2:55 PM

## 2021-06-29 NOTE — Progress Notes (Signed)
Occupational Therapy Session Note ? ?Patient Details  ?Name: Henry Simmons ?MRN: [id number] ?Date of Birth: [birth year] ? ?Today's Date: 06/29/2021 ?OT Individual Time: 0905-1000 ?OT Individual Time Calculation (min): 55 min  ? ? ?Short Term Goals: ?Week 1:  OT Short Term Goal 1 (Week 1): Pt will complete ADL task EOB with no more than min A for balance. ?OT Short Term Goal 1 - Progress (Week 1): Not met ?OT Short Term Goal 2 (Week 1): Pt will don shirt in supported sitting with min A. ?OT Short Term Goal 2 - Progress (Week 1): Met ?OT Short Term Goal 3 (Week 1): Pt will complete grooming task with no more than min A. ?OT Short Term Goal 3 - Progress (Week 1): Met ?OT Short Term Goal 4 (Week 1): Pt will complete toilet transfer with max A and LRAD. ?OT Short Term Goal 4 - Progress (Week 1): Not met ?Week 2:  OT Short Term Goal 1 (Week 2): Pt will complete toilet transfer with max A and LRAD. ?OT Short Term Goal 2 (Week 2): Pt will complete ADL task EOB with no more than min A for balance. ?OT Short Term Goal 3 (Week 2): Pt will complete standing grooming task at sink with mod A of 2. ?OT Short Term Goal 4 (Week 2): Pt will don pants with max A. ? ?Skilled Therapeutic Interventions/Progress Updates:  ?  Pt received seated in w/c, no s/sx pain, agreeable to therapy. Session focus on self-care retraining, activity tolerance, RLE NMR in prep for improved ADL/IADL/func mobility performance + decreased caregiver burden. ? ?Doffed hospital gown min A to remove RUE. Donned button up shirt with mod A to thread RUE and to button, pt able to assist with buttoning but initially attempting to don shirt as overhead shirt. Max A to thread RLE into pants, pt able to assist threading LLE and pulling up over B hips, use of stedy with min to mod A for balance without LUE support. Total A to don B shoes + R AFO. Pt able to remove his socks with assist to achieve figure 4 position, able to don his L shoe this way, as well with assist to  tie it. ? ?Self-propelled w/c > gym with max A, pt with difficulty getting thumb caught in brake despite hand over hand assistance for initiation/proper technique. Elected to only use LLE to propel, assist to avoid veering to R. ? ?10 min on/off on kinetron at 25 cm/sec, only min A to assist RLE/prevent external hip rotation. Ace wrap to R hand to keep grip on arm rests. MD in/out morning rounds. Cues throughout for upright posture and to overcome L gaze preference. ? ? ?Pt left seated in w/c with safety belt alarm engaged, call bell in reach, and all immediate needs met.  ? ? ?Therapy Documentation ?Precautions:  ?Precautions ?Precaution Comments: R hemiparesis, apraxic, global aphasia, diplopia ?Restrictions ?Weight Bearing Restrictions: No ?RLE Weight Bearing: Weight bearing as tolerated ? ?Pain: no signs/sx throughout ?  ?ADL: See Care Tool for more details. ? ? ?Therapy/Group: Individual Therapy ? ?Volanda Napoleon MS, OTR/L ? ?06/29/2021, 6:55 AM ?

## 2021-06-29 NOTE — Progress Notes (Signed)
Speech Language Pathology Weekly Progress and Session Note ? ?Patient Details  ?Name: Henry Simmons ?MRN: [id number] ?Date of Birth: 1970/07/26 ? ?Beginning of progress report period: Jun 22, 2021 ?End of progress report period: Jun 29, 2021 ? ?Today's Date: 06/29/2021 ?SLP Individual Time: 7564-3329 ?SLP Individual Time Calculation (min): 60 min ? ?Short Term Goals: ?Week 2: SLP Short Term Goal 1 (Week 2): Pt will answer basic yes/no questions with 70% accuracy via multimodal means with Max A. - ~55% accuracy given Max multimodal A and multimodal means of communication.  ?Not met ? ?SLP Short Term Goal 2 (Week 2): Pt will follow one-step commands within context of functional task on 50% of trials given Min A. - ~30% with Mod multimodal A. ?Not met ? ?SLP Short Term Goal 3 (Week 2): Pt will complete receptive ID of common + functional words with 80% accuracy given Min A. - 80% accuracy in field of 3 when picture was provided and words were read aloud. ? ?SLP Short Term Goal 4 (Week 2): Pt will vocalize/phonation x 1 given Max A. - Did not phonate despite Max A to include articulatory placement cues, automatic phrases, MIT techniques, and use of mirror and model prompts. ? ?SLP Short Term Goal 5 (Week 2): Pt will utilize multimodal communication methods to communicate wants, needs, thoughts, and ideas x 10 with written choice prompts. - x 7 with use of written choice prompts and hand gestures which were spontaneously produced. ? ?SLP Short Term Goal 6 (Week 2): Will consume trials of Dysphagia 2 textures with minimal oral residuals and no evidence of aspiration given Min A for safe swallow strategies. - Did not formally address this session. ? ?  ?New Short Term Goals: ?Week 3: SLP Short Term Goal 1 (Week 3): Pt will answer basic yes/no questions with 60% accuracy via multimodal means with Max A. ?SLP Short Term Goal 2 (Week 3): Pt will follow one-step commands within context of functional or therapeutic task on 50% of  trials given Mod multimodal A. ?SLP Short Term Goal 3 (Week 3): Pt will complete receptive ID of common + functional words with accompanying picture with 80% accuracy when word choices are read aloud. ?SLP Short Term Goal 4 (Week 3): Pt will utilize multimodal communication methods to communicate wants, needs, thoughts, and ideas x 8 with written choice prompts. ?SLP Short Term Goal 5 (Week 3): Pt will consume trials of ice chips with functional mastication and no s/sx concerning for aspiration to improve oral, motor planning for mastcation on 5 trials with Min A. ? ?Weekly Progress Updates: Pt has demonstrated slower progress in auditory comprehension, motor speech, and verbal expression as evident by only meeting 1 out of 5  goals this past week; therefore, short-term goals have been modified accordingly. Pt remains limited by severity of communication and motor planning deficits. Family education ongoing re: establishment of yes/no responses, use of written choice prompts + low-tech AAC supports, and use of simple language. Tolerating dysphagia 2 diet with thin liquids without clinical evidence c/f aspiration. Pt continues to present with severe to profound non-fluent aphasia + apraxia; therefore, will benefit from ongoing skilled ST intervention targeting communication and oral deglutition deficits to maximize independence and decrease caregiver burden. Will continue per updated ST POC. ? ?Intensity: Minumum of 1-2 x/day, 30 to 90 minutes ?Frequency: 3 to 5 out of 7 days ?Duration/Length of Stay: 4 weeks ?Treatment/Interventions: Financial trader;Dysphagia/aspiration precaution training;Environmental controls;Functional tasks;Multimodal communication approach;Patient/family education;Speech/Language facilitation ? ? ?Daily Session ? ?  Skilled Therapeutic Interventions:  ?Pt seen this date for skilled ST intervention targeting communication goals outlined above. Pt received awake/alert and lying in bed. Agreeable  to ST intervention.  ?  ?SLP facilitated today's session by continuing to provide the following ST interventions during functional and therapeutic tx tasks: supported communication interventions (written binary choice prompts, picture cues, gesture use, establishment + reinforcement of yes/no system (head side to side for "no" and thumbs up for "yes" responses + low-tech AAC board), simple language, Max multimodal A for expression of wants/needs, and tactile cueing + model prompts/demonstration for motor planning/execution, and use of mirror + verbal and model prompts for articulatory placement during phonation trials. ?  ?Appeared somewhat stimulable for skilled interventions and is demonstrating slow progress as evident by subtle improvement in basic auditory comprehension, use of basic yes/no system, and written + verbal choice prompts on whiteboard for functional communication. Please see above for objective data collected during today's session.   ?Pt left in w/c with safety belt donned, call bell within reach, and all immediate needs met. Continue per updated POC.   ? ?Pain ?No pain reported via use of written choice prompts/supports; NAD appreciated. ? ?Therapy/Group: Individual Therapy ? ?Henry Simmons A Henry Simmons ?06/29/2021, 8:50 AM ? ? ? ? ? ? ?

## 2021-06-29 NOTE — Progress Notes (Signed)
?                                                       PROGRESS NOTE ? ? ?Subjective/Complaints: ? ?Aphasic/apraxic ?Seen in OT able to push with RLE on Nu step  ?ROS: Limited due to language/communication  ? ? ?Objective: ?  ?No results found. ?Recent Labs  ?  06/27/21 ?0751  ?WBC 6.0  ?HGB 13.2  ?HCT 40.3  ?PLT 266  ? ? ?Recent Labs  ?  06/27/21 ?0751 06/28/21 ?1914  ?NA 138  --   ?K 4.4  --   ?CL 109  --   ?CO2 20*  --   ?GLUCOSE 115*  --   ?BUN 26*  --   ?CREATININE 1.29* 1.40*  ?CALCIUM 9.6  --   ? ? ? ?Intake/Output Summary (Last 24 hours) at 06/29/2021 0943 ?Last data filed at 06/29/2021 0700 ?Gross per 24 hour  ?Intake 503 ml  ?Output --  ?Net 503 ml  ? ?  ? ?  ? ?Physical Exam: ?Vital Signs ?Blood pressure 132/90, pulse 96, temperature 98.6 ?F (37 ?C), resp. rate 15, height _0  (1.88 m), weight 76.3 kg, SpO2 98 %. ? ?General: No acute distress ?Mood and affect are appropriate ?Heart: Regular rate and rhythm no rubs murmurs or extra sounds ?Lungs: Clear to auscultation, breathing unlabored, no rales or wheezes ?Abdomen: Positive bowel sounds, soft nontender to palpation, nondistended ?Extremities: No clubbing, cyanosis, or edema ?Skin: No evidence of breakdown, no evidence of rash ? ? ?Neurologic: globally aphasic. But able to close eyes to command , imitates gestures but perseverates, unable to perform body part ID  ?Cranial nerves II through XII intact, motor strength is 5/5 in left and 0/5 right deltoid, bicep, tricep, grip, hip flexor, knee extensors, ankle dorsiflexor and plantar flexor ?Sensory exam cannot assess due to aphasia  ? ?Musculoskeletal: No pain with RUE or LE PROM or LUE, LLE AROM . ? ? ?Assessment/Plan: ?1. Functional deficits which require 3+ hours per day of interdisciplinary therapy in a comprehensive inpatient rehab setting. ?Physiatrist is providing close team supervision and 24 hour management of active medical problems listed below. ?Physiatrist and rehab team continue to assess  barriers to discharge/monitor patient progress toward functional and medical goals ? ?Care Tool: ? ?Bathing ?   ?Body parts bathed by patient: Chest, Abdomen, Right arm, Right upper leg, Left upper leg, Face  ? Body parts bathed by helper: Left arm, Right lower leg, Left lower leg, Buttocks, Front perineal area ?  ?  ?Bathing assist Assist Level: Moderate Assistance - Patient 50 - 74% ?  ?  ?Upper Body Dressing/Undressing ?Upper body dressing   ?What is the patient wearing?: Pull over shirt ?   ?Upper body assist Assist Level: Moderate Assistance - Patient 50 - 74% ?   ?Lower Body Dressing/Undressing ?Lower body dressing ? ? ?   ?What is the patient wearing?: Incontinence brief, Pants ? ?  ? ?Lower body assist Assist for lower body dressing: Maximal Assistance - Patient 25 - 49% ?   ? ?Toileting ?Toileting    ?Toileting assist Assist for toileting: Moderate Assistance - Patient 50 - 74% ?  ?  ?Transfers ?Chair/bed transfer ? ?Transfers assist ?   ? ?Chair/bed transfer assist level: Moderate Assistance - Patient 50 - 74% ?  ?  ?  Locomotion ?Ambulation ? ? ?Ambulation assist ? ? Ambulation activity did not occur: Safety/medical concerns ? ?  ?  ?   ? ?Walk 10 feet activity ? ? ?Assist ? Walk 10 feet activity did not occur: Safety/medical concerns ? ?  ?   ? ?Walk 50 feet activity ? ? ?Assist Walk 50 feet with 2 turns activity did not occur: Safety/medical concerns ? ?  ?   ? ? ?Walk 150 feet activity ? ? ?Assist Walk 150 feet activity did not occur: Safety/medical concerns ? ?  ?  ?  ? ?Walk 10 feet on uneven surface  ?activity ? ? ?Assist Walk 10 feet on uneven surfaces activity did not occur: Safety/medical concerns ? ? ?  ?   ? ?Wheelchair ? ? ? ? ?Assist Is the patient using a wheelchair?: Yes ?Type of Wheelchair: Manual ?  ? ?Wheelchair assist level: Maximal Assistance - Patient 25 - 49% ?Max wheelchair distance: 150  ? ? ?Wheelchair 50 feet with 2 turns activity ? ? ? ?Assist ? ?  ?  ? ? ?Assist Level: Maximal  Assistance - Patient 25 - 49%  ? ?Wheelchair 150 feet activity  ? ? ? ?Assist ?   ? ? ?Assist Level: Maximal Assistance - Patient 25 - 49%  ? ?Blood pressure 132/90, pulse 96, temperature 98.6 ?F (37 ?C), resp. rate 15, height _0  (1.88 m), weight 76.3 kg, SpO2 98 %. ? ?Medical Problem List and Plan: ?1. 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            -patient may  shower ?            -ELOS/Goals: Expected discharge 07/07/21 min A/Mod A for SLP,PT and OT team conf in am  ? -Continue CIR therapies including PT, OT, and SLP, Team conference today please see physician documentation under team conference tab, met with team  to discuss problems,progress, and goals. Formulized individual treatment plan based on medical history, underlying problem and comorbidities. ?  ? -WC mobility 250 ft with min supervision and hemi technique ?2.  Antithrombotics: ?-DVT/anticoagulation:  Pharmaceutical: Lovenox ?            -antiplatelet therapy: Aspirin 81 mg daily and Plavix 75 mg daily ?3. Pain Management: Tylenol as needed ? -Not requiring frequent tylenol use, continue to monitor ?4. Mood: Provide emotional support ?            -antipsychotic agents: N/A ?5. Neuropsych: This patient is not capable of making decisions on his own behalf. ?6. Skin/Wound Care: Routine skin checks ?7. Fluids/Electrolytes/Nutrition: Routine in and outs with follow-up chemistries ?8.  CAD/LAD stent/non-STEMI.  Continue aspirin and Plavix.  Follow-up per cardiology services ?9.  Hypertrophic cardiomyopathy/bradycardia.  Initially with temporary pacer.  No current plan for permanent pacemaker.  Continue Jardiance 10 mg daily. ?10.  Hypertension.  continue Norvasc 2.5 mg daily, hydralazine 10 mg twice daily, Isordil 10 mg twice daily,Lopressor 12.5 mg BID, Cozaar 100 mg daily. Monitor with increased mobility ?Vitals:  ? 06/28/21 2010  06/29/21 0412  ?BP: 124/85 132/90  ?Pulse: 88 96  ?Resp: 15 15  ?Temp: 98.6 ?F (37 ?C) 98.6 ?F (37 ?C)  ?SpO2: 100% 98%  ?Given intracranial stenosis would allow BP to run upper normal 130-140s, will decrease Cozaar to 12.71m starting on 5/9- may need 2-3 d to equilibrate ?5/14 Will stop hydralazine today, BP  slightly below goal overall ?5/16 d/c cozaar , creat creeping up  ? ?11.  CKD stage III.  Follow-up chemistries- ? ?  Latest Ref Rng & Units 06/28/2021  ?  5:47 AM 06/27/2021  ?  7:51 AM 06/21/2021  ?  5:48 AM  ?BMP  ?Glucose 70 - 99 mg/dL  115     ?BUN 6 - 20 mg/dL  26     ?Creatinine 0.61 - 1.24 mg/dL 1.40   1.29   1.34    ?Sodium 135 - 145 mmol/L  138     ?Potassium 3.5 - 5.1 mmol/L  4.4     ?Chloride 98 - 111 mmol/L  109     ?CO2 22 - 32 mmol/L  20     ?Calcium 8.9 - 10.3 mg/dL  9.6     ? ?BUN/Creat creeping up but GFR still normal , reducing Cozaar should help , enc po fluid  ?-if intake < 1042m needs IV overnite - po intake recorded as ~60105mbut no IVF documented  ?-Continue to encourage fluid intake ?-d/c cozaar  ?12.  Hyperlipidemia.  continue Lipitor ? 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 -primarily oral ? -D2 thin diet- no cough with liquid  ? -has good appetite, 5/14 ate all his breakfast, continue to follow ? ?LOS: ?15 days ?A FACE TO FACE EVALUATION WAS PERFORMED ? ?AnLuanna Salkirsteins ?06/29/2021, 9:43 AM  ? ? ? ?

## 2021-06-30 DIAGNOSIS — I63512 Cerebral infarction due to unspecified occlusion or stenosis of left middle cerebral artery: Secondary | ICD-10-CM | POA: Diagnosis not present

## 2021-06-30 LAB — GLUCOSE, CAPILLARY: Glucose-Capillary: 103 mg/dL — ABNORMAL HIGH (ref 70–99)

## 2021-06-30 NOTE — Progress Notes (Signed)
Occupational Therapy Session Note  Patient Details  Name: Henry Simmons MRN: [id number] Date of Birth: 03-06-1970  Today's Date: 06/30/2021 OT Individual Time: 2836-6294 OT Individual Time Calculation (min): 62 min + 31 min   Short Term Goals: Week 1:  OT Short Term Goal 1 (Week 1): Pt will complete ADL task EOB with no more than min A for balance. OT Short Term Goal 1 - Progress (Week 1): Not met OT Short Term Goal 2 (Week 1): Pt will don shirt in supported sitting with min A. OT Short Term Goal 2 - Progress (Week 1): Met OT Short Term Goal 3 (Week 1): Pt will complete grooming task with no more than min A. OT Short Term Goal 3 - Progress (Week 1): Met OT Short Term Goal 4 (Week 1): Pt will complete toilet transfer with max A and LRAD. OT Short Term Goal 4 - Progress (Week 1): Not met Week 2:  OT Short Term Goal 1 (Week 2): Pt will complete toilet transfer with max A and LRAD. OT Short Term Goal 2 (Week 2): Pt will complete ADL task EOB with no more than min A for balance. OT Short Term Goal 3 (Week 2): Pt will complete standing grooming task at sink with mod A of 2. OT Short Term Goal 4 (Week 2): Pt will don pants with max A.  Skilled Therapeutic Interventions/Progress Updates:    Session 1 [phone]): Pt received semi-reclined in bed, no s/sx pain, appears agreeable to therapy. Session focus on self-care retraining, activity tolerance, func cognition in prep for improved ADL/IADL/func mobility performance + decreased caregiver burden.  Came to sitting EOB on his L with heavy use of bed rail and CGA to min A for balance/to progress RLE off bed. Intermittent mod A to recover sititng balance while engaged in ADL seated EOB due to tendency to lose balance to his R. Doffed shirt with mod A to pull shirt over head/remove RUE. Bathed UB with min A to bathe LUE, total A to incorporate RUE functionally. Pt continues to perseverate on bathing face/head requiring multimodal cuing to move on to  other body parts, bathing UB seems more difficult for pt than bathing BLE, which he is able to bathe with only min A for balance. Donned shirt with mod A to thread RUE and to pull down over R trunk. Total A to don pants, pt able to assist with pull over knees, attempts to pull over hips but loses balance. Sit to stand mod A to maintain midline with LUE supported on back of chair. Total A to don B teds/shoes, pt able to remove socks with assist for balance and able to place L foot in shoe. Total A to don R AFO. Continues to require cues to attend to RUE/RLE, but noted to be able to active RLE without assist from LUE this date during ADL.   RN/MD in/out morning assessment/meds.  Completed oral care seated at sink with S and set-up A of materials.  Seated at table in gym, pt able to organize silverware in organizer with items placed to his R to force R attention. Required overall min A to organize by utensil type/error awareness/neatly stack utensils.  Pt left seated in w/c with safety belt alarm engaged, call bell in reach, and all immediate needs met.    Session 2 [phone]): Pt received semi-reclined in bed with his parents present, no s/sx pain and appears, agreeable to therapy. Session focus on self-care retraining, activity tolerance in prep  for improved ADL/IADL/func mobility performance + decreased caregiver burden. Came to sitting EOB with min A to progress RLE off bed, CGA to min A for balance, mod A to recover from R LOB to R when assist to don B shoes, total A to don B shoes/R AFO.  Pt pointed to "bathroom" on communication sheet when prompted, although when in stedy and transported to bathroom pt shaking head and raising LUE, appearing to not want to go to bathroom. Pt tends to raise LUE with L hand open when appearing to not want to do task. Did note that his brief/pants/shirt wet with urine. Mod A to remove shirt from head/RUE, pt donned new shirt min A to thread RUE. Total A for changing  out brief/pants with min A in stedy for balance, max cues to maintain LUE on rail for balance. Required mod to max A for balance without UE support as pt pulls up pants on L. Stedy transfer > bed, mod A to adjust BLE in bed pt but able to follow cues to assist with use of bed rail.   Per mother pt likes 80s/90s rap, left music playlist on in room, pt pointing to "music" when given 2 written options to listen to music or watch TV.    Pt left semi-reclined in bed with bed alarm engaged, call bell in reach, and all immediate needs met.    Therapy Documentation Precautions:  Precautions Precaution Comments: R hemiparesis, apraxic, global aphasia, diplopia Restrictions Weight Bearing Restrictions: No RLE Weight Bearing: Weight bearing as tolerated  Pain: see session notes   ADL: See Care Tool for more details.  Therapy/Group: Individual Therapy  Volanda Napoleon MS, OTR/L  06/30/2021, 6:55 AM

## 2021-06-30 NOTE — Progress Notes (Addendum)
831: pt changed, meds administered  1115: pt brief dry, pt up in chair, meds administered  1401: pt brief dry, drink w/n reach and pt encouraged to drink

## 2021-06-30 NOTE — Progress Notes (Signed)
Speech Language Pathology Daily Session Note  Patient Details  Name: Henry Simmons MRN: [id number] Date of Birth: [birth year]  Today's Date: 06/30/2021 SLP Individual Time: 7672-0947 SLP Individual Time Calculation (min): 57 min  Short Term Goals: Week 3: SLP Short Term Goal 1 (Week 3): Pt will answer basic yes/no questions with 60% accuracy via multimodal means with Max A. - Answered yes/no questions re: likes/dislikes of drink items and known personal information with ~50% accuracy via multimodal means (low-tech AAC yes/no board, written choice prompts, and head + hand gestures) with Max A. Subjectively, performed best with head gestures vs use of low-tech AAC board this date.  SLP Short Term Goal 2 (Week 3): Pt will follow one-step commands within context of functional or therapeutic task on 50% of trials given Mod multimodal A. - ~50% accuracy given Mod multimodal A.  SLP Short Term Goal 3 (Week 3): Pt will complete receptive ID of common + functional words with accompanying picture with 80% accuracy when word choices are read aloud. - 60% accuracy given Mod multimodal A   SLP Short Term Goal 4 (Week 3): Pt will utilize multimodal communication methods to communicate wants, needs, thoughts, and ideas x 8 with written choice prompts. x8+ with use of Max multimodal communication methods (low-tech AAC yes/no board, written choice prompts, head and hand gestures, as well as facial expressions).  SLP Short Term Goal 5 (Week 3): Pt will consume trials of ice chips with functional mastication and no s/sx concerning for aspiration to improve oral, motor planning for mastcation on 5 trials with Min A.  Pt demonstrated mastication with ice chip x 1 briefly. Primary oral motor pattern was sucking.  Skilled Therapeutic Interventions: Pt seen this date for skilled ST intervention targeting dysphagia and communication goals outlined above. Pt received awake/alert and OOB in w/c. Agreeable to ST  intervention.    SLP facilitated today's session by continuing to provide the following ST interventions during functional and therapeutic tx tasks: supported communication interventions (written binary choice prompts, picture cues, gesture use, establishment + reinforcement of yes/no system (head side to side for "no" and thumbs up for "yes" responses + low-tech AAC board), simple language, Max multimodal A for expression of wants/needs, and tactile cueing + model prompts/demonstration for motor planning/execution, verbal articulatory cues. and use of mirror during mastication trials. Re: deglutition, pt with increased sucking vs mastication of ice chips, though no evidence of aspiration observed. Per chart review, VSS. Vocalized x 1 and mouthed "bye" given Max A.   Appeared somewhat stimulable for skilled interventions and is demonstrating slow progress as evident by subtle improvement in basic auditory comprehension, use of basic yes/no system, and written + verbal choice prompts on whiteboard for functional communication. Please see above for objective data collected during today's session.          Pt left in w/c. Direct handoff to rehab tech. Continue per current ST POC.           Pain No pain reported per communication supports; NAD appreciated.  Therapy/Group: Individual Therapy  [name] A Parry Po 06/30/2021, 2:28 PM

## 2021-06-30 NOTE — Progress Notes (Signed)
PROGRESS NOTE   Subjective/Complaints:   No issues with OT, discussed oral intake and need for IVF if po intake is <1032ml from 7a-7p with nsg ROS: Limited due to language/communication    Objective:   No results found. No results for input(s): WBC, HGB, HCT, PLT in the last 72 hours.  Recent Labs    06/28/21 0547  CREATININE 1.40*     Intake/Output Summary (Last 24 hours) at 06/30/2021 0814 Last data filed at 06/30/2021 0804 Gross per 24 hour  Intake 360 ml  Output --  Net 360 ml         Physical Exam: Vital Signs Blood pressure 124/86, pulse 81, temperature 98.2 F (36.8 C), temperature source Oral, resp. rate 18, height 6\' 2"  (1.88 m), weight 77.6 kg, SpO2 100 %.  General: No acute distress Mood and affect are appropriate Heart: Regular rate and rhythm no rubs murmurs or extra sounds Lungs: Clear to auscultation, breathing unlabored, no rales or wheezes Abdomen: Positive bowel sounds, soft nontender to palpation, nondistended Extremities: No clubbing, cyanosis, or edema Skin: No evidence of breakdown, no evidence of rash   Neurologic: globally aphasic. But able to close eyes to command , imitates gestures but perseverates, unable to perform body part ID  Cranial nerves II through XII intact, motor strength is 5/5 in left and 0/5 right deltoid, bicep, tricep, grip, hip flexor, knee extensors, ankle dorsiflexor and plantar flexor Sensory exam cannot assess due to aphasia   Musculoskeletal: No pain with RUE or LE PROM or LUE, LLE AROM .   Assessment/Plan: 1. Functional deficits which require 3+ hours per day of interdisciplinary therapy in a comprehensive inpatient rehab setting. Physiatrist is providing close team supervision and 24 hour management of active medical problems listed below. Physiatrist and rehab team continue to assess barriers to discharge/monitor patient progress toward functional and  medical goals  Care Tool:  Bathing    Body parts bathed by patient: Chest, Abdomen, Right arm, Right upper leg, Left upper leg, Face   Body parts bathed by helper: Left arm, Right lower leg, Left lower leg, Buttocks, Front perineal area     Bathing assist Assist Level: Moderate Assistance - Patient 50 - 74%     Upper Body Dressing/Undressing Upper body dressing   What is the patient wearing?: Pull over shirt    Upper body assist Assist Level: Moderate Assistance - Patient 50 - 74%    Lower Body Dressing/Undressing Lower body dressing      What is the patient wearing?: Incontinence brief, Pants     Lower body assist Assist for lower body dressing: Maximal Assistance - Patient 25 - 49%     Toileting Toileting    Toileting assist Assist for toileting: Moderate Assistance - Patient 50 - 74%     Transfers Chair/bed transfer  Transfers assist     Chair/bed transfer assist level: Moderate Assistance - Patient 50 - 74%     Locomotion Ambulation   Ambulation assist   Ambulation activity did not occur: Safety/medical concerns          Walk 10 feet activity   Assist  Walk 10 feet activity did not occur: Safety/medical  concerns        Walk 50 feet activity   Assist Walk 50 feet with 2 turns activity did not occur: Safety/medical concerns         Walk 150 feet activity   Assist Walk 150 feet activity did not occur: Safety/medical concerns         Walk 10 feet on uneven surface  activity   Assist Walk 10 feet on uneven surfaces activity did not occur: Safety/medical concerns         Wheelchair     Assist Is the patient using a wheelchair?: Yes Type of Wheelchair: Manual    Wheelchair assist level: Maximal Assistance - Patient 25 - 49% Max wheelchair distance: 150    Wheelchair 50 feet with 2 turns activity    Assist        Assist Level: Maximal Assistance - Patient 25 - 49%   Wheelchair 150 feet activity      Assist      Assist Level: Maximal Assistance - Patient 25 - 49%   Blood pressure 124/86, pulse 81, temperature 98.2 F (36.8 C), temperature source Oral, resp. rate 18, height 6\' 2"  (1.88 m), weight 77.6 kg, SpO2 100 %.  Medical Problem List and Plan: 1. 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  Antithrombotics: -DVT/anticoagulation:  Pharmaceutical: Lovenox             -antiplatelet therapy: Aspirin 81 mg daily and Plavix 75 mg daily 3. Pain Management: Tylenol as needed  -Not requiring frequent tylenol use, continue to monitor 4. Mood: Provide emotional support             -antipsychotic agents: N/A 5. Neuropsych: This patient is not capable of making decisions on his own behalf. 6. Skin/Wound Care: Routine skin checks 7. Fluids/Electrolytes/Nutrition: Routine in and outs with follow-up chemistries 8.  CAD/LAD stent/non-STEMI.  Continue aspirin and Plavix.  Follow-up per cardiology services 9.  Hypertrophic cardiomyopathy/bradycardia.  Initially with temporary pacer.  No current plan for permanent pacemaker.  Continue Jardiance 10 mg daily. 10.  Hypertension.  continue Norvasc 2.5 mg daily, hydralazine 10 mg twice daily, Isordil 10 mg twice daily,Lopressor 12.5 mg BID, Cozaar 100 mg daily. Monitor with increased mobility Vitals:   06/29/21 1945 06/30/21 0514  BP: (!) 131/95 124/86  Pulse: 89 81  Resp: 16 18  Temp: 98.7 F (37.1 C) 98.2 F (36.8 C)  SpO2: 100% 100%  Given intracranial stenosis would allow BP to run upper normal 130-140s, will decrease Cozaar to 12.5mg  starting on 5/9- may need 2-3 d to  equilibrate 5/14 Will stop hydralazine today, BP slightly below goal overall 5/16 d/c cozaar , creat creeping up   Cont Lopressor 12.5 mg BID, Isordil 10mg  BID, Norvasc 2.5 mg qd 11.  CKD stage III.  Follow-up chemistries-    Latest Ref Rng & Units 06/28/2021    5:47 AM 06/27/2021    7:51 AM 06/21/2021    5:48 AM  BMP  Glucose 70 - 99 mg/dL  115     BUN 6 - 20 mg/dL  26     Creatinine 0.61 - 1.24 mg/dL 1.40   1.29   1.34    Sodium 135 - 145 mmol/L  138     Potassium 3.5 - 5.1 mmol/L  4.4     Chloride 98 - 111 mmol/L  109     CO2 22 - 32 mmol/L  20     Calcium 8.9 - 10.3 mg/dL  9.6      BUN/Creat creeping up but GFR still normal , reducing Cozaar should help , enc po fluid  -if intake < 1049ml needs IV overnite - po intake recorded as ~611ml but no IVF documented  -Continue to encourage fluid intake -d/c cozaar - recheck BMET in am  12.  Hyperlipidemia.  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

## 2021-06-30 NOTE — Progress Notes (Signed)
Physical Therapy Session Note  Patient Details  Name: Henry Simmons MRN: [id number] Date of Birth: 01/23/1971  Today's Date: 06/30/2021 PT Individual Time: 7353-2992 PT Individual Time Calculation (min): 45 min   Short Term Goals: Week 2:  PT Short Term Goal 1 (Week 2): Pt will trasnfer to [facility] with min assist PT Short Term Goal 2 (Week 2): Pt will propell WC 152ft with min assist PT Short Term Goal 3 (Week 2): Pt will ambulate 25ft with LRAD and max assist +2 consistently  Skilled Therapeutic Interventions/Progress Updates:     Direct handoff of care from SLP to start session. Pt sitting in w/c and agreeable to PT tx. R AFO and tennis shoes on. Transported to main rehab hallway for time to focus on gait training.  Using 3 musketeer and +2 assist, gait training 2x27ft with maxA. Also used leg lifter to assist with managing paretic RLE in gait cycle. Assist primarily required for managing pushing R, lateral weight shifting, blocking RLE in stance to prevent buckling. Pt demonstrating step-to gait pattern with very limited stance time on L, decreaed heel strike on R, forward flexed trunk. Needs more hip/trunk extension.  WC mobility training with hemi technique - 133ft with min assist. Mod cues for sequencing L hemibody and straight path propelling. Pt with very limited L shoulder extension for propelling, resulting in very brief short pushes with his L arm. He also grabs too far forward on the rim/wheel, resulting in pinching b/w brake and wheel.   Returned to room where he remained seated in w/c. 1/2 lap tray donned to support RUE. Safety belt alarm on and call bell in lap.   Therapy Documentation Precautions:  Precautions Precaution Comments: R hemiparesis, apraxic, global aphasia, diplopia Restrictions Weight Bearing Restrictions: No RLE Weight Bearing: Weight bearing as tolerated General:     Therapy/Group: Individual Therapy  Zackary Mckeone P Mandee Pluta 06/30/2021, 7:25 AM

## 2021-07-01 DIAGNOSIS — I63512 Cerebral infarction due to unspecified occlusion or stenosis of left middle cerebral artery: Secondary | ICD-10-CM | POA: Diagnosis not present

## 2021-07-01 DIAGNOSIS — R4701 Aphasia: Secondary | ICD-10-CM | POA: Diagnosis not present

## 2021-07-01 DIAGNOSIS — G8191 Hemiplegia, unspecified affecting right dominant side: Secondary | ICD-10-CM | POA: Diagnosis not present

## 2021-07-01 DIAGNOSIS — I639 Cerebral infarction, unspecified: Secondary | ICD-10-CM | POA: Diagnosis not present

## 2021-07-01 NOTE — Progress Notes (Signed)
Physical Therapy Session Note  Patient Details  Name: Henry Simmons MRN: [id number] Date of Birth: 1970-11-01  Today's Date: 07/01/2021 PT Individual Time: 1134-1205 PT Individual Time Calculation (min): 31 min   Short Term Goals: Week 1:  PT Short Term Goal 1 (Week 1): pt will transfer to [facility] with mod assist PT Short Term Goal 1 - Progress (Week 1): Met PT Short Term Goal 2 (Week 1): Pt will ambulate 69f with max assist and LRAD PT Short Term Goal 2 - Progress (Week 1): Met PT Short Term Goal 3 (Week 1): Pt pt will propell WC 108fwith min assist PT Short Term Goal 3 - Progress (Week 1): Met Week 2:  PT Short Term Goal [facility] (Week 2): [facility] Short Term Goal 2 (Week 2): Pt will propell WC 15027fith min assist PT Short Term Goal 3 (Week 2): Pt will ambulate 51f27fth LRAD and max assist +2 consistently  Skilled Therapeutic Interventions/Progress Updates:  Patient seated upright in w/c on entrance to room. Patient alert and agreeable to PT session. Relates desire to toilet with targeted questioning.   Patient with no pain complaint throughout session.  Therapeutic Activity: Transfers: Patient performed sit<>stand  with ModA to maintain balance once standing and stand pivot transfer w/c<>toilet with MaxA for RLE step advancement and knee block. Requires vc for sequencing, maintaining hand support, overall technique.  Squat pivot transfer blocked practice to both directions w/c <> bed. Pt initially   Neuromuscular Re-ed: NMR facilitated during session with focus on standing balance, proprioceptive awareness. Pt guided in standing... NMR performed for improvements in motor control and coordination, balance, sequencing, judgement, and self confidence/ efficacy in performing all aspects of mobility at highest level of independence.   Patient ***  in *** at end of session with brakes locked, *** alarm set, and all needs within reach.   Therapy  Documentation Precautions:  Precautions Precaution Comments: R hemiparesis, apraxic, global aphasia, diplopia Restrictions Weight Bearing Restrictions: No RLE Weight Bearing: Weight bearing as tolerated General:   Vital Signs: Therapy Vitals Temp: 98.4 F (36.9 C) Temp Source: Oral Pulse Rate: 86 Resp: 18 BP: 124/88 Patient Position (if appropriate): Lying Oxygen Therapy SpO2: 100 % O2 Device: Room Air Pain:  No pain complaint this session.  Therapy/Group: Individual Therapy  [name] DPT 07/01/2021, 6:04 PM

## 2021-07-01 NOTE — Progress Notes (Signed)
Speech Language Pathology Daily Session Note  Patient Details  Name: Henry Simmons MRN: [id number] Date of Birth: 10-01-1970  Today's Date: 07/01/2021 SLP Individual Time: 0918-1000 SLP Individual Time Calculation (min): 42 min  Short Term Goals: Week 3: SLP Short Term Goal 1 (Week 3): Pt will answer basic yes/no questions with 60% accuracy via multimodal means with Max A. SLP Short Term Goal 2 (Week 3): Pt will follow one-step commands within context of functional or therapeutic task on 50% of trials given Mod multimodal A. SLP Short Term Goal 3 (Week 3): Pt will complete receptive ID of common + functional words with accompanying picture with 80% accuracy when word choices are read aloud. SLP Short Term Goal 4 (Week 3): Pt will utilize multimodal communication methods to communicate wants, needs, thoughts, and ideas x 8 with written choice prompts. SLP Short Term Goal 5 (Week 3): Pt will consume trials of ice chips with functional mastication and no s/sx concerning for aspiration to improve oral, motor planning for mastcation on 5 trials with Min A.  Skilled Therapeutic Interventions: Skilled ST treatment focused on language goals. Pt received upright in wheelchair. SLP facilitated session by providing mod A multimodal cues for answering biographical yes/no questions of known information with 80% accuracy using gestures (head + hand gestures) and low tech AAC yes/no board. Pt required max A multimodal cues for less familiar concepts with ~50% accuracy. Pt identified field of 2-3 written choices as presented on low-tech AAC supports with 63% accuracy given max A verbal/visual/contextual cues, 52% accuracy when given mod A verbal/visual cues. Pt completed receptive ID by identifying field of 2 opposing pictures based on semantic features (e.g., which one is dirty/clean, big/small, over/under) with 75% accuracy with mod-to-max A multimodal (semantic, gestures, repetition) cues. Patient was left in [facility]  with alarm activated and immediate needs within reach at end of session. Continue per current plan of care.      Pain Pain Assessment Pain Scale: Faces Pain Score: 0-No pain Faces Pain Scale: Hurts a little bit Pain Location: Head Pain Descriptors / Indicators: Headache Pain Intervention(s): Refused Multiple Pain Sites: No  Therapy/Group: Individual Therapy  Tamala Ser 07/01/2021, 9:34 AM

## 2021-07-01 NOTE — Progress Notes (Signed)
Patient ID: Henry Simmons, male   DOB: 05-27-1970, 51 y.o.   MRN: [id number]  Family education scheduled on 5/23 and 5/24 1-4 PM

## 2021-07-01 NOTE — Progress Notes (Signed)
Physical Therapy Weekly Progress Note  Patient Details  Name: Henry Simmons MRN: [id number] Date of Birth: 1970-05-06  Beginning of progress report period: Jun 23, 2021 End of progress report period: Jul 01, 2021  Today's Date: 07/01/2021 PT Individual Time: 800-920   80 min  Patient has met 2 of 3 short term goals.  Pt is making slow progress towards LTG of min assist transfers. Supervision assist WC propulsion, and mod-max assist gait. Currently. Pt requires min-mod assist for squat pivot and lateral scoot transfers. 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 Family has not been present for education to this point.   Patient continues to demonstrate the following deficits muscle weakness, muscle joint tightness, and muscle paralysis, decreased cardiorespiratoy endurance, impaired timing and sequencing, abnormal tone, unbalanced muscle activation, motor apraxia, and decreased coordination, field cut and hemianopsia, decreased midline orientation and decreased attention to right, decreased attention, decreased awareness, decreased problem solving, decreased safety awareness, decreased memory, and delayed processing, and decreased sitting balance, decreased standing balance, decreased postural control, hemiplegia, and decreased balance strategies and therefore will continue to benefit from skilled PT intervention to increase functional independence with mobility.  Patient progressing toward long term goals..  Continue plan of care.  PT Short Term Goals Week 2:  PT Short Term Goal 1 (Week 2): Pt will trasnfer to [facility] with min assist PT Short Term Goal 1 - Progress (Week 2): Revised due to lack of progress PT Short Term Goal 2 (Week 2): Pt will propell WC 114f with min assist PT Short Term Goal 2 - Progress (Week 2): Met PT Short Term Goal 3 (Week 2): Pt will ambulate 566fwith LRAD and max  assist +2 consistently PT Short Term Goal 3 - Progress (Week 2): Met Week 3:  PT Short Term Goal 1 (Week 3): STG=LTG due to ELOS  Skilled Therapeutic Interventions/Progress Updates:   Pt received supine in bed and agreeable to PT. Supine>sit transfer with min  assist and cues for attention to the RLE. Noted to have extensor tone assisting with RLE position.   [facility] and mod-max cues for motor planning to prevent stand pivot motor pattern.   Car transfer training with SB and min assist and max cues for for sequencing of lateral scoot transfer in and out of car and management of the RLE. Performed squat pivot transfer to elevated car with mod-max assist and RLW blocked for safety. Noted to have partial activation in RLE< but poor motor planning without SB requiring max cues for safety.   WC mobility through hall and rehab gym x 20072fith supervision assist  With max cues for steering to the L and awareness of obstacles on the R. Pt only hit 1 wall on this day.   Gait training with max assist+2 at rail and HHA on the LUE 2 x 69f74fth cues for posture, attention to the RLE, midline orientation. Mild improvement in posture and weigh shift with UE supported on rail in hall compared to HHA.[facility] to room and left sitting in [place] [facility] call bell in reach and all needs met.        Therapy Documentation Precautions:  Precautions Precaution Comments: R hemiparesis, apraxic, global aphasia, diplopia Restrictions Weight Bearing Restrictions: No RLE Weight Bearing: Weight bearing as tolerated    Vital Signs: Therapy Vitals Temp: 98  F (36.7 C) Temp Source: Oral Pulse Rate: 75 Resp: 18 BP: 115/84 Patient Position (if appropriate): Lying Oxygen Therapy SpO2: 100 % O2 Device: Room Air Pain: Pain Assessment Pain Scale: 0-10 Pain Score: 0-No pain  Therapy/Group: Individual Therapy  Lorie Phenix 07/01/2021, 8:05 AM

## 2021-07-01 NOTE — Progress Notes (Signed)
Occupational Therapy Weekly Progress Note  Patient Details  Name: Henry Simmons MRN: [id number] Date of Birth: 11-05-1970  Beginning of progress report period: Jun 23, 2021 End of progress report period: Jul 01, 2021  Today's Date: 07/01/2021 OT Individual Time: 1335-1440 OT Individual Time Calculation (min): 65 min    Patient has met 1 of 4 short term goals.  Pt continues to make slow  progress this week towards LTG due to severity of impairments. Pt has demonstrated improved static sitting/standing balance to presently complete UB bathing at min A level, LB bathing at mod A level, UBD at min to mod A level, and max to total A for toileting, LBD. Pt most consistently completing toilet transfers with min to mod A in stedy, and benefits from +2 assistance for balance/pericare. Pt cont to be primarily limited by dense R hemi/increase tone, global aphasia, ideational and motor apraxia, impulsivity, and R inattention. RUE and hand currently assessed at Brummstrom level I and I respectively with increased tone. Anticipate 24/7 S and mod to max physical assist required upon DC to facilitate ADL. Family education not yet initiated, wife only momentarily present for sessions due to her work schedule.    Patient continues to demonstrate the following deficits: muscle weakness, impaired timing and sequencing, abnormal tone, unbalanced muscle activation, motor apraxia, decreased coordination, and decreased motor planning, decreased midline orientation, decreased attention to right, decreased motor planning, and ideational apraxia, decreased attention, decreased awareness, decreased problem solving, and decreased safety awareness, and decreased sitting balance, decreased standing balance, decreased postural control, hemiplegia, and decreased balance strategies and therefore will continue to benefit from skilled OT intervention to enhance overall performance with BADL and Reduce care partner burden.  Patient  not progressing toward long term goals.  See goal revision..  Plan of care revisions: See care plan.  OT Short Term Goals Week 1:  OT Short Term Goal 1 (Week 1): Pt will complete ADL task EOB with no more than min A for balance. OT Short Term Goal 1 - Progress (Week 1): Not met OT Short Term Goal 2 (Week 1): Pt will don shirt in supported sitting with min A. OT Short Term Goal 2 - Progress (Week 1): Met OT Short Term Goal 3 (Week 1): Pt will complete grooming task with no more than min A. OT Short Term Goal 3 - Progress (Week 1): Met OT Short Term Goal 4 (Week 1): Pt will complete toilet transfer with max A and LRAD. OT Short Term Goal 4 - Progress (Week 1): Not met Week 2:  OT Short Term Goal 1 (Week 2): Pt will complete toilet transfer with max A and LRAD. OT Short Term Goal 1 - Progress (Week 2): Not met OT Short Term Goal 2 (Week 2): Pt will complete ADL task EOB with no more than min A for balance. OT Short Term Goal 2 - Progress (Week 2): Not met OT Short Term Goal 3 (Week 2): Pt will complete standing grooming task at sink with mod A of 2. OT Short Term Goal 3 - Progress (Week 2): Not met OT Short Term Goal 4 (Week 2): Pt will don pants with max A. OT Short Term Goal 4 - Progress (Week 2): Met Week 3:  OT Short Term Goal 1 (Week 3): STG = LTG 2/2 ELOS  Skilled Therapeutic Interventions/Progress Updates:    Pt received semi-reclined in bed with parents present, points to "no pain" in communication booklet when prompted, appears agreeable to therapy. Session  focus on self-care retraining, activity tolerance, R NMR, transfer retraining in prep for improved ADL/IADL/func mobility performance + decreased caregiver burden.  Came to sitting EOB with min to mod A to progress RLE and prevent posterior LOB. Squat-pivot via several smaller scoots to reclining back w/c on his R with overall mod A of 2 therapists to prevent pt from coming into standing, cues to keep trunk flexed and to push through  LUE. Total A w/c transport to gym, pt able to self-propel via hemi technique with light min A for turns last 15 ft.   Massed practiced of drop arm BSC transfers via multiple small squat-pivots with and without use of SB. Use of colored dot for LUE placement and min to mod A of 2 therapists to manage RLE/prevent R LOB/and to facilitate forward trunk flexion to avoid coming into standing. Does demonstrate slightly more consistent performance with use of SB but ultimately requires max multimodal cuing for carry over of motor planning.  Attempted to force use of RLE with LLE supported on 1 inch step via 3 musketeer technique, rolling B knees side to side in supine, and bending BLE, but pt with difficulty activating RLE to complete activities without dependent assist.  Transitioned to tall kneeing with use of K bench and max assist of 3 therapists, pt practiced reaching to LUQ of wall to remove / place tapes with good activation of upper RLE musculature to come into tall kneeling position, assist to prevent R LOB and to promote RUE WB on bench.  Pt left semi-reclined in bed with bed alarm engaged, call bell in reach, and all immediate needs met.    Therapy Documentation Precautions:  Precautions Precaution Comments: R hemiparesis, apraxic, global aphasia, diplopia Restrictions Weight Bearing Restrictions: No RLE Weight Bearing: Weight bearing as tolerated  Pain: denies with use of communication board   ADL: See Care Tool for more details.  Therapy/Group: Individual Therapy  Volanda Napoleon MS, OTR/L  07/01/2021, 6:57 AM
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
## 2021-07-01 NOTE — Progress Notes (Addendum)
Patient ID: Henry Simmons, male   DOB: [birth year], 51 y.o.   MRN: [id number]  Patient declined by Latricia Heft due to insurance Patient declined by [facility] Patient accepted by [employer] for PT/OT/SLP orders sent

## 2021-07-01 NOTE — Progress Notes (Signed)
Orthopedic Tech Progress Note Patient Details:  Henry Simmons [facility] 06/25/1970 [id number] Called in AFO to [facility] Patient ID: Henry Simmons, male   DOB: 1970/11/15, 51 y.o.   MRN: [id number]  [name] 07/01/2021, 8:58 AM

## 2021-07-01 NOTE — Progress Notes (Signed)
PROGRESS NOTE   Subjective/Complaints:   No issues with OT, discussed oral intake and need for IVF if po intake is <1042ml from 7a-7p with nsg- intake 1466ml yesterday  ROS: Limited due to language/communication    Objective:   No results found. No results for input(s): WBC, HGB, HCT, PLT in the last 72 hours.  No results for input(s): NA, K, CL, CO2, GLUCOSE, BUN, CREATININE, CALCIUM in the last 72 hours.   Intake/Output Summary (Last 24 hours) at 07/01/2021 0845 Last data filed at 07/01/2021 0845 Gross per 24 hour  Intake 1290 ml  Output --  Net 1290 ml         Physical Exam: Vital Signs Blood pressure 115/84, pulse 75, temperature 98 F (36.7 C), temperature source Oral, resp. rate 18, height 6\' 2"  (1.88 m), weight 78.7 kg, SpO2 100 %.  General: No acute distress Mood and affect are appropriate Heart: Regular rate and rhythm no rubs murmurs or extra sounds Lungs: Clear to auscultation, breathing unlabored, no rales or wheezes Abdomen: Positive bowel sounds, soft nontender to palpation, nondistended Extremities: No clubbing, cyanosis, or edema Skin: No evidence of breakdown, no evidence of rash   Neurologic: globally aphasic. But able to close eyes to command , imitates gestures but perseverates, unable to perform body part ID  Cranial nerves II through XII intact, motor strength is 5/5 in left and 0/5 right deltoid, bicep, tricep, grip, hip flexor, knee extensors, ankle dorsiflexor and plantar flexor Sensory exam cannot assess due to aphasia   Musculoskeletal: No joint swelling .   Assessment/Plan: 1. Functional deficits which require 3+ hours per day of interdisciplinary therapy in a comprehensive inpatient rehab setting. Physiatrist is providing close team supervision and 24 hour management of active medical problems listed below. Physiatrist and rehab team continue to assess barriers to discharge/monitor  patient progress toward functional and medical goals  Care Tool:  Bathing    Body parts bathed by patient: Chest, Abdomen, Right arm, Right upper leg, Left upper leg, Face   Body parts bathed by helper: Left arm, Right lower leg, Left lower leg, Buttocks, Front perineal area     Bathing assist Assist Level: Moderate Assistance - Patient 50 - 74%     Upper Body Dressing/Undressing Upper body dressing   What is the patient wearing?: Pull over shirt    Upper body assist Assist Level: Moderate Assistance - Patient 50 - 74%    Lower Body Dressing/Undressing Lower body dressing      What is the patient wearing?: Incontinence brief, Pants     Lower body assist Assist for lower body dressing: Maximal Assistance - Patient 25 - 49%     Toileting Toileting    Toileting assist Assist for toileting: Moderate Assistance - Patient 50 - 74%     Transfers Chair/bed transfer  Transfers assist     Chair/bed transfer assist level: Moderate Assistance - Patient 50 - 74%     Locomotion Ambulation   Ambulation assist   Ambulation activity did not occur: Safety/medical concerns          Walk 10 feet activity   Assist  Walk 10 feet activity did not occur:  Safety/medical concerns        Walk 50 feet activity   Assist Walk 50 feet with 2 turns activity did not occur: Safety/medical concerns         Walk 150 feet activity   Assist Walk 150 feet activity did not occur: Safety/medical concerns         Walk 10 feet on uneven surface  activity   Assist Walk 10 feet on uneven surfaces activity did not occur: Safety/medical concerns         Wheelchair     Assist Is the patient using a wheelchair?: Yes Type of Wheelchair: Manual    Wheelchair assist level: Maximal Assistance - Patient 25 - 49% Max wheelchair distance: 150    Wheelchair 50 feet with 2 turns activity    Assist        Assist Level: Maximal Assistance - Patient 25 - 49%    Wheelchair 150 feet activity     Assist      Assist Level: Maximal Assistance - Patient 25 - 49%   Blood pressure 115/84, pulse 75, temperature 98 F (36.7 C), temperature source Oral, resp. rate 18, height 6\' 2"  (1.88 m), weight 78.7 kg, SpO2 100 %.  Medical Problem List and Plan: 1. 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  Antithrombotics: -DVT/anticoagulation:  Pharmaceutical: Lovenox             -antiplatelet therapy: Aspirin 81 mg daily and Plavix 75 mg daily 3. Pain Management: Tylenol as needed  -Not requiring frequent tylenol use, continue to monitor 4. Mood: Provide emotional support             -antipsychotic agents: N/A 5. Neuropsych: This patient is not capable of making decisions on his own behalf. 6. Skin/Wound Care: Routine skin checks 7. Fluids/Electrolytes/Nutrition: Routine in and outs with follow-up chemistries 8.  CAD/LAD stent/non-STEMI.  Continue aspirin and Plavix.  Follow-up per cardiology services 9.  Hypertrophic cardiomyopathy/bradycardia.  Initially with temporary pacer.  No current plan for permanent pacemaker.  Continue Jardiance 10 mg daily. 10.  Hypertension.  continue Norvasc 2.5 mg daily, hydralazine 10 mg twice daily, Isordil 10 mg twice daily,Lopressor 12.5 mg BID, Cozaar 100 mg daily. Monitor with increased mobility Vitals:   06/30/21 2037 07/01/21 0512  BP: (!) 124/91 115/84  Pulse: 76 75  Resp: 18 18  Temp: 98.1 F (36.7 C) 98 F (36.7 C)  SpO2: 100% 100%  Given intracranial stenosis would allow BP to run upper normal 130-140s, will decrease Cozaar to 12.5mg  starting on  5/9- may need 2-3 d to equilibrate 5/14 Will stop hydralazine today, BP slightly below goal overall 5/16 d/c cozaar , creat creeping up   Cont Lopressor 12.5 mg BID, Isordil 10mg  BID, Norvasc 2.5 mg qd 11.  CKD stage III.  Follow-up chemistries-    Latest Ref Rng & Units 06/28/2021    5:47 AM 06/27/2021    7:51 AM 06/21/2021    5:48 AM  BMP  Glucose 70 - 99 mg/dL  115     BUN 6 - 20 mg/dL  26     Creatinine 0.61 - 1.24 mg/dL 1.40   1.29   1.34    Sodium 135 - 145 mmol/L  138     Potassium 3.5 - 5.1 mmol/L  4.4     Chloride 98 - 111 mmol/L  109     CO2 22 - 32 mmol/L  20     Calcium 8.9 - 10.3 mg/dL  9.6      BUN/Creat creeping up but GFR still normal , reducing Cozaar should help , enc po fluid  -if intake < 1062ml needs IV overnite - po intake improved  -Continue to encourage fluid intake -d/c cozaar - recheck BMET  5/22 12.  Hyperlipidemia.  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

## 2021-07-02 DIAGNOSIS — I63512 Cerebral infarction due to unspecified occlusion or stenosis of left middle cerebral artery: Secondary | ICD-10-CM | POA: Diagnosis not present

## 2021-07-02 MED ORDER — MAGNESIUM HYDROXIDE 400 MG/5ML PO SUSP
30.0000 mL | ORAL | Status: DC | PRN
  Administered 2021-07-03: 30 mL via ORAL
  Filled 2021-07-02: qty 30

## 2021-07-02 NOTE — Progress Notes (Signed)
PROGRESS NOTE   Subjective/Complaints: No new complaints this morning Wife and family are at bedside. Denies pain   ROS: Appears comfortable, limited due to communication deficit   Objective:   No results found. No results for input(s): WBC, HGB, HCT, PLT in the last 72 hours.  No results for input(s): NA, K, CL, CO2, GLUCOSE, BUN, CREATININE, CALCIUM in the last 72 hours.   Intake/Output Summary (Last 24 hours) at 07/02/2021 1517 Last data filed at 07/02/2021 1302 Gross per 24 hour  Intake 1080 ml  Output 300 ml  Net 780 ml        Physical Exam: Vital Signs Blood pressure 125/84, pulse 85, temperature 98.7 F (37.1 C), temperature source Oral, resp. rate 17, height 6\' 2"  (1.88 m), weight 78.7 kg, SpO2 100 %. Gen: no distress, normal appearing HEENT: oral mucosa pink and moist, NCAT Cardio: Reg rate Chest: normal effort, normal rate of breathing Abd: soft, non-distended Ext: no edema Psych: pleasant, normal affect Skin: intact Neurologic: globally aphasic. But able to close eyes to command , imitates gestures but perseverates, unable to perform body part ID  Cranial nerves II through XII intact, motor strength is 5/5 in left and 0/5 right deltoid, bicep, tricep, grip, hip flexor, knee extensors, ankle dorsiflexor and plantar flexor Sensory exam cannot assess due to aphasia   Musculoskeletal: No joint swelling .   Assessment/Plan: 1. Functional deficits which require 3+ hours per day of interdisciplinary therapy in a comprehensive inpatient rehab setting. Physiatrist is providing close team supervision and 24 hour management of active medical problems listed below. Physiatrist and rehab team continue to assess barriers to discharge/monitor patient progress toward functional and medical goals  Care Tool:  Bathing    Body parts bathed by patient: Chest, Abdomen, Right arm, Right upper leg, Left upper leg,  Face   Body parts bathed by helper: Left arm, Right lower leg, Left lower leg, Buttocks, Front perineal area     Bathing assist Assist Level: Moderate Assistance - Patient 50 - 74%     Upper Body Dressing/Undressing Upper body dressing   What is the patient wearing?: Pull over shirt    Upper body assist Assist Level: Moderate Assistance - Patient 50 - 74%    Lower Body Dressing/Undressing Lower body dressing      What is the patient wearing?: Incontinence brief, Pants     Lower body assist Assist for lower body dressing: Maximal Assistance - Patient 25 - 49%     Toileting Toileting    Toileting assist Assist for toileting: Moderate Assistance - Patient 50 - 74%     Transfers Chair/bed transfer  Transfers assist     Chair/bed transfer assist level: Moderate Assistance - Patient 50 - 74%     Locomotion Ambulation   Ambulation assist   Ambulation activity did not occur: Safety/medical concerns          Walk 10 feet activity   Assist  Walk 10 feet activity did not occur: Safety/medical concerns        Walk 50 feet activity   Assist Walk 50 feet with 2 turns activity did not occur: Safety/medical concerns  Walk 150 feet activity   Assist Walk 150 feet activity did not occur: Safety/medical concerns         Walk 10 feet on uneven surface  activity   Assist Walk 10 feet on uneven surfaces activity did not occur: Safety/medical concerns         Wheelchair     Assist Is the patient using a wheelchair?: Yes Type of Wheelchair: Manual    Wheelchair assist level: Maximal Assistance - Patient 25 - 49% Max wheelchair distance: 150    Wheelchair 50 feet with 2 turns activity    Assist        Assist Level: Maximal Assistance - Patient 25 - 49%   Wheelchair 150 feet activity     Assist      Assist Level: Maximal Assistance - Patient 25 - 49%   Blood pressure 125/84, pulse 85, temperature 98.7 F (37.1  C), temperature source Oral, resp. rate 17, height 6\' 2"  (1.88 m), weight 78.7 kg, SpO2 100 %.  Medical Problem List and Plan: 1. 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  Antithrombotics: -DVT/anticoagulation:  Pharmaceutical: Lovenox             -antiplatelet therapy: Aspirin 81 mg daily and Plavix 75 mg daily 3. Pain Management: Tylenol as needed  -Not requiring frequent tylenol use, continue to monitor 4. Mood: Provide emotional support             -antipsychotic agents: N/A 5. Neuropsych: This patient is not capable of making decisions on his own behalf. 6. Skin/Wound Care: Routine skin checks 7. Fluids/Electrolytes/Nutrition: Routine in and outs with follow-up chemistries 8.  CAD/LAD stent/non-STEMI.  Continue aspirin and Plavix.  Follow-up per cardiology services 9.  Hypertrophic cardiomyopathy/bradycardia.  Initially with temporary pacer.  No current plan for permanent pacemaker. continue Jardiance 10 mg daily. 10.  Hypertension.  continue Norvasc 2.5 mg daily, hydralazine 10 mg twice daily, Isordil 10 mg twice daily,Lopressor 12.5 mg BID, Cozaar 100 mg daily. Monitor with increased mobility Vitals:   07/02/21 0507 07/02/21 1324  BP: (!) 129/101 125/84  Pulse: 71 85  Resp: 18 17  Temp: 98.2 F (36.8 C) 98.7 F (37.1 C)  SpO2: 100% 100%  Given intracranial stenosis would allow BP to run upper normal 130-140s, will decrease Cozaar to 12.5mg  starting on 5/9- may need 2-3 d to equilibrate 5/14 Will stop hydralazine today, BP slightly below goal overall 5/16 d/c cozaar , creat creeping up   Conitnue Lopressor 12.5  mg BID, Isordil 10mg  BID, Norvasc 2.5 mg qd 11.  CKD stage III.  Follow-up chemistries-    Latest Ref Rng & Units 06/28/2021    5:47 AM 06/27/2021    7:51 AM 06/21/2021    5:48 AM  BMP  Glucose 70 - 99 mg/dL  115     BUN 6 - 20 mg/dL  26     Creatinine 0.61 - 1.24 mg/dL 1.40   1.29   1.34    Sodium 135 - 145 mmol/L  138     Potassium 3.5 - 5.1 mmol/L  4.4     Chloride 98 - 111 mmol/L  109     CO2 22 - 32 mmol/L  20     Calcium 8.9 - 10.3 mg/dL  9.6      BUN/Creat creeping up but GFR still normal , reducing Cozaar should help , enc po fluid  -if intake < 1060ml needs IV overnite - po intake improved  -Continue to encourage fluid intake -d/c cozaar - recheck BMET  5/22 12.  Hyperlipidemia.  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

## 2021-07-02 NOTE — Progress Notes (Signed)
Physical Therapy Session Note  Patient Details  Name: Henry Simmons MRN: [id number] Date of Birth: 13-Aug-1970  Today's Date: 07/02/2021 PT Individual Time: 1606-1700 PT Individual Time Calculation (min): 54 min   Short Term Goals:  Week 3:  PT Short Term Goal 1 (Week 3): STG=LTG due to ELOS   Skilled Therapeutic Interventions/Progress Updates:   Pt received supine in bed and agreeable to PT. Supine>sit transfer with mod assist at trunk cues for attention to the R LE. Sitting balance EOB to don shoes and R AFO.   Lateral scoot across SB with min assist and moderate cues for sequencing to prevent stand pivot motor plan. WC mobility through hall x 168f with supervision assist and max cues for use of foot to steer to the L to avoid obstacles on the R side.   Sit<>stand x 8 in parallel bars with min assist from PT and RLE blocked. Dynamic gait training in parallel bars forward and reverse 2 x 830feach with max assist from PT for posture, midline, weight shifting, and blocking the R knee stance due to absent knee extension activation in standing on this day. Stepping with LLE and RLE blocked in stance. Noted to have trace hip extension with advancement of LLE beyond RLE. Minisquat with pt weight shifted to force Wb through RLE with LUE supported on PT 2 x 8.   Pt returned to room and performed lateral scoot transfer to bed with min assist and cues for forced use of the RLE. Sit>supine completed with min assist for safety to RLE. PT applied Hand splint to RUE and left supine in bed with call bell in reach and all needs met. Wife present and education of need for specialty WC, slide board, and stedy if possible. Wife verbalized understanding.              Therapy Documentation Precautions:  Precautions Precaution Comments: R hemiparesis, apraxic, global aphasia, diplopia Restrictions Weight Bearing Restrictions: No RLE Weight Bearing: Weight bearing as tolerated    Pain: Denies  with head shake   Therapy/Group: Individual Therapy  AuLorie Phenix/20/2023, 6:12 PM

## 2021-07-02 NOTE — Progress Notes (Signed)
Speech Language Pathology Daily Session Note  Patient Details  Name: Henry Simmons MRN: [id number] Date of Birth: October 30, 1970  Today's Date: 07/02/2021 SLP Individual Time: 0805-0900 SLP Individual Time Calculation (min): 55 min  Short Term Goals: Week 3: SLP Short Term Goal 1 (Week 3): Pt will answer basic yes/no questions with 60% accuracy via multimodal means with Max A. SLP Short Term Goal 2 (Week 3): Pt will follow one-step commands within context of functional or therapeutic task on 50% of trials given Mod multimodal A. SLP Short Term Goal 3 (Week 3): Pt will complete receptive ID of common + functional words with accompanying picture with 80% accuracy when word choices are read aloud. SLP Short Term Goal 4 (Week 3): Pt will utilize multimodal communication methods to communicate wants, needs, thoughts, and ideas x 8 with written choice prompts. SLP Short Term Goal 5 (Week 3): Pt will consume trials of ice chips with functional mastication and no s/sx concerning for aspiration to improve oral, motor planning for mastcation on 5 trials with Min A.  Skilled Therapeutic Interventions:  Pt was seen for skilled ST targeting goals for communication.  Pt was awake and alert upon arrival and indicated via head nod and shake that he had already eaten breakfast, he was not in pain, and he did not need to use the bathroom.  Information was verified by nursing.  Pt also indicated via hand gestures that he had already brushed his teeth when SLP began gathering supplies for oral care.  SLP facilitated the session with various structured table top tasks to continue working towards multimodal means of functional communication for pt.  Pt could match object to word from a field of three with no more than min assist cues needed to recognize and correct errors.  Pt was also able to identify more abstract concepts in the context of opposites from pictures (open/closed, over/under, in/out, on/off, dirty/clean) with  min assist to recognize and correct errors.  Pt could also identify emotions in pictures from a field of four with mod assist multimodal cues; however, when field was increased to eight choices pt needed up to max assist multimodal cues.  Pt needed mod-max assist multimodal cues to convey immediate, personal needs as therapist was leaving with use of yes/no communication board and written choice of three on white board.  Pt was left in bed with bed alarm set and call bell within reach.  Continue per current plan of care.    Pain Pain Assessment Pain Scale: Faces Faces Pain Scale: No hurt  Therapy/Group: Individual Therapy  Laszlo Ellerby, Selinda Orion 07/02/2021, 12:09 PM

## 2021-07-03 DIAGNOSIS — K5900 Constipation, unspecified: Secondary | ICD-10-CM | POA: Diagnosis not present

## 2021-07-03 DIAGNOSIS — G8191 Hemiplegia, unspecified affecting right dominant side: Secondary | ICD-10-CM | POA: Diagnosis not present

## 2021-07-03 DIAGNOSIS — I63512 Cerebral infarction due to unspecified occlusion or stenosis of left middle cerebral artery: Secondary | ICD-10-CM | POA: Diagnosis not present

## 2021-07-03 DIAGNOSIS — I639 Cerebral infarction, unspecified: Secondary | ICD-10-CM | POA: Diagnosis not present

## 2021-07-03 NOTE — Progress Notes (Signed)
PROGRESS NOTE   Subjective/Complaints: No new complaints this morning Provided list of foods to help with constipation. Watching TV comfortably  ROS: Appears comfortable, limited due to communication deficit   Objective:   No results found. No results for input(s): WBC, HGB, HCT, PLT in the last 72 hours.  No results for input(s): NA, K, CL, CO2, GLUCOSE, BUN, CREATININE, CALCIUM in the last 72 hours.   Intake/Output Summary (Last 24 hours) at 07/03/2021 2014 Last data filed at 07/03/2021 1843 Gross per 24 hour  Intake 1140 ml  Output --  Net 1140 ml        Physical Exam: Vital Signs Blood pressure 125/85, pulse 81, temperature 98.1 F (36.7 C), temperature source Oral, resp. rate 18, height 6\' 2"  (1.88 m), weight 76.9 kg, SpO2 100 %. Gen: no distress, normal appearing HEENT: oral mucosa pink and moist, NCAT Cardio: Reg rate Chest: normal effort, normal rate of breathing Abd: soft, non-distended Ext: no edema Psych: pleasant, normal affect Neurologic: globally aphasic. But able to close eyes to command , imitates gestures but perseverates, unable to perform body part ID  Cranial nerves II through XII intact, motor strength is 5/5 in left and 0/5 right deltoid, bicep, tricep, grip, hip flexor, knee extensors, ankle dorsiflexor and plantar flexor Sensory exam cannot assess due to aphasia   Musculoskeletal: No joint swelling .   Assessment/Plan: 1. Functional deficits which require 3+ hours per day of interdisciplinary therapy in a comprehensive inpatient rehab setting. Physiatrist is providing close team supervision and 24 hour management of active medical problems listed below. Physiatrist and rehab team continue to assess barriers to discharge/monitor patient progress toward functional and medical goals  Care Tool:  Bathing    Body parts bathed by patient: Chest, Abdomen, Right arm, Right upper leg, Left  upper leg, Face   Body parts bathed by helper: Left arm, Right lower leg, Left lower leg, Buttocks, Front perineal area     Bathing assist Assist Level: Moderate Assistance - Patient 50 - 74%     Upper Body Dressing/Undressing Upper body dressing   What is the patient wearing?: Pull over shirt    Upper body assist Assist Level: Moderate Assistance - Patient 50 - 74%    Lower Body Dressing/Undressing Lower body dressing      What is the patient wearing?: Incontinence brief, Pants     Lower body assist Assist for lower body dressing: Maximal Assistance - Patient 25 - 49%     Toileting Toileting    Toileting assist Assist for toileting: Moderate Assistance - Patient 50 - 74%     Transfers Chair/bed transfer  Transfers assist     Chair/bed transfer assist level: Moderate Assistance - Patient 50 - 74%     Locomotion Ambulation   Ambulation assist   Ambulation activity did not occur: Safety/medical concerns          Walk 10 feet activity   Assist  Walk 10 feet activity did not occur: Safety/medical concerns        Walk 50 feet activity   Assist Walk 50 feet with 2 turns activity did not occur: Safety/medical concerns  Walk 150 feet activity   Assist Walk 150 feet activity did not occur: Safety/medical concerns         Walk 10 feet on uneven surface  activity   Assist Walk 10 feet on uneven surfaces activity did not occur: Safety/medical concerns         Wheelchair     Assist Is the patient using a wheelchair?: Yes Type of Wheelchair: Manual    Wheelchair assist level: Maximal Assistance - Patient 25 - 49% Max wheelchair distance: 150    Wheelchair 50 feet with 2 turns activity    Assist        Assist Level: Maximal Assistance - Patient 25 - 49%   Wheelchair 150 feet activity     Assist      Assist Level: Maximal Assistance - Patient 25 - 49%   Blood pressure 125/85, pulse 81, temperature 98.1  F (36.7 C), temperature source Oral, resp. rate 18, height 6\' 2"  (1.88 m), weight 76.9 kg, SpO2 100 %.  Medical Problem List and Plan: 1. 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  Impaired mobility: Continue Lovenox             -antiplatelet therapy: Aspirin 81 mg daily and Plavix 75 mg daily 3. Pain: N/A 4. Mood: Provide emotional support             -antipsychotic agents: N/A 5. Neuropsych: This patient is not capable of making decisions on his own behalf. 6. Skin/Wound Care: Routine skin checks 7. Fluids/Electrolytes/Nutrition: Routine in and outs with follow-up chemistries 8.  CAD/LAD stent/non-STEMI.  continue aspirin and Plavix.  Follow-up per cardiology services 9.  Hypertrophic cardiomyopathy/bradycardia.  Initially with temporary pacer.  No current plan for permanent pacemaker. continue Jardiance 10 mg daily. 10.  Hypertension.  continue Norvasc 2.5 mg daily, hydralazine 10 mg twice daily, Isordil 10 mg twice daily,Lopressor 12.5 mg BID, Cozaar 100 mg daily. Monitor with increased mobility Vitals:   07/03/21 1307 07/03/21 1928  BP: 116/86 125/85  Pulse: 83 81  Resp: 18 18  Temp: 98.7 F (37.1 C) 98.1 F (36.7 C)  SpO2: 100% 100%  Given intracranial stenosis would allow BP to run upper normal 130-140s, will decrease Cozaar to 12.5mg  starting on 5/9- may need 2-3 d to equilibrate 5/14 Will stop hydralazine today, BP slightly below goal overall 5/16 d/c cozaar , creat creeping up   Conitnue Lopressor 12.5 mg BID, Isordil 10mg  BID, Norvasc 2.5 mg qd 11.  CKD stage III.  Follow-up chemistries-    Latest Ref Rng &  Units 06/28/2021    5:47 AM 06/27/2021    7:51 AM 06/21/2021    5:48 AM  BMP  Glucose 70 - 99 mg/dL  115     BUN 6 - 20 mg/dL  26     Creatinine 0.61 - 1.24 mg/dL 1.40   1.29   1.34    Sodium 135 - 145 mmol/L  138     Potassium 3.5 - 5.1 mmol/L  4.4  Chloride 98 - 111 mmol/L  109     CO2 22 - 32 mmol/L  20     Calcium 8.9 - 10.3 mg/dL  9.6      BUN/Creat creeping up but GFR still normal , reducing Cozaar should help , enc po fluid  -if intake < 1085ml needs IV overnite - po intake improved  -Continue to encourage fluid intake -d/c cozaar - recheck BMET  5/22 12.  Hyperlipidemia.  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

## 2021-07-04 DIAGNOSIS — I63512 Cerebral infarction due to unspecified occlusion or stenosis of left middle cerebral artery: Secondary | ICD-10-CM | POA: Diagnosis not present

## 2021-07-04 LAB — BASIC METABOLIC PANEL
Anion gap: 7 (ref 5–15)
BUN: 19 mg/dL (ref 6–20)
CO2: 24 mmol/L (ref 22–32)
Calcium: 9.6 mg/dL (ref 8.9–10.3)
Chloride: 107 mmol/L (ref 98–111)
Creatinine, Ser: 1.35 mg/dL — ABNORMAL HIGH (ref 0.61–1.24)
GFR, Estimated: >60 mL/min (ref >60)
Glucose, Bld: 148 mg/dL — ABNORMAL HIGH (ref 70–99)
Potassium: 4 mmol/L (ref 3.5–5.1)
Sodium: 138 mmol/L (ref 135–145)

## 2021-07-04 NOTE — Progress Notes (Signed)
Speech Language Pathology Daily Session Note  Patient Details  Name: Henry Simmons MRN: [id number] Date of Birth: 30-May-1970  Today's Date: 07/04/2021 SLP Individual Time: 7893-8101 SLP Individual Time Calculation (min): 59 min  Short Term Goals: Week 3: SLP Short Term Goal 1 (Week 3): Pt will answer basic yes/no questions with 60% accuracy via multimodal means with Max A. - ~70% accuracy when asked factual questions re: name of practiced items given Mod tactile and verbal A for L hand to remain down/inactivate, in order to encourage motor execution from head for "no" responses and tapping thumb for thumb up response to indicate "yes."  SLP Short Term Goal 2 (Week 3): Pt will follow one-step commands within context of functional or therapeutic task on 50% of trials given Mod multimodal A. - 50% accuracy given Mod multimodal A for following commands within the context of a familiar task (bed to w/c transfer). Receptively ID'ed objects in field of 2-4 based on function with 80% accuracy.  SLP Short Term Goal 3 (Week 3): Pt will complete receptive ID of common + functional words with accompanying picture with 80% accuracy when word choices are read aloud. - 75% accuracy when word choices were read aloud.  SLP Short Term Goal 4 (Week 3): Pt will utilize multimodal communication methods to communicate wants, needs, thoughts, and ideas x 8 with written choice prompts. - x 8 with Mod-Max A via multimodal means to include providing pt with written choice prompts in field of 3 and reading words aloud + head and hand gestures, and facial expressions.  SLP Short Term Goal 5 (Week 3): Pt will consume trials of ice chips with functional mastication and no s/sx concerning for aspiration to improve oral, motor planning for mastcation on 5 trials with Min A.- Did not formally address this date.  Skilled Therapeutic Interventions: Pt seen this date for skilled ST intervention targeting oral motor and language  goals outlined above. Pt received awake/alert and lying semi-reclined in bed. Agreeable to ST intervention via head nod and hand gesture.  SLP facilitated today's session by continuing to provide the following ST interventions during functional and therapeutic tx tasks: supported communication interventions (written binary choice prompts, picture cues, gesture use, reinforcement of yes/no system, simple language, Mod-Max multimodal A for expression of wants/needs, and tactile cueing + model prompts/demonstration for motor planning/execution  during command following attempts, in addition to Total verbal + tactile cues to oral structures in an effort to elicit speech sounds and oral motor movements (e.g. smile, round lips); motor perseveration noted with oral motor movements.   Pt produced vocalization, which appeared to be an approximation of "yeah", x 1 when asked personal yes/no question re: his weekend and written response selected was inaccurate. Did not appear stimulable for MIT techniques, sentence completion, model prompts, nor articulatory placement cues during verbal expressions tasks despite utilization.    This session, pt demonstrated strong stimulability for skilled interventions. Please see above for objective data collected during today's session. Family education planned for 5/23 and 5/24 to prepare for pt's upcoming discharge date (07/07/2021). Continue to recommend use of A  Pt left in w/c with all safety measures activated. Call bell reviewed and within reach and all immediate needs met. Continue per current ST POC.   Pain No pain indicated with use of FACES chart. NAD appreciated.  Therapy/Group: Individual Therapy  Tionne Dayhoff A Lunna Vogelgesang 07/04/2021, 1:08 PM

## 2021-07-04 NOTE — Progress Notes (Signed)
Occupational Therapy Session Note  Patient Details  Name: NIV DARLEY MRN: [id number] Date of Birth: [birth year]  Today's Date: 07/04/2021 OT Individual Time: 1030-1130 OT Individual Time Calculation (min): 60 min    Short Term Goals: Week 1:  OT Short Term Goal 1 (Week 1): Pt will complete ADL task EOB with no more than min A for balance. OT Short Term Goal 1 - Progress (Week 1): Not met OT Short Term Goal 2 (Week 1): Pt will don shirt in supported sitting with min A. OT Short Term Goal 2 - Progress (Week 1): Met OT Short Term Goal 3 (Week 1): Pt will complete grooming task with no more than min A. OT Short Term Goal 3 - Progress (Week 1): Met OT Short Term Goal 4 (Week 1): Pt will complete toilet transfer with max A and LRAD. OT Short Term Goal 4 - Progress (Week 1): Not met  Skilled Therapeutic Interventions/Progress Updates:    S: During therapy session, patient nodded head when asked if his right arm was hurting during passive ROM.   O:  - Functional transfer: w/c to mat table performed using a lateral scoot technique towards left side; arm rest removed, yellow dot used for visual and hand placement cue for left arm. Min assist needed with VC for form and technique.  - P/ROM: left shoulder, elbow, wrist, and hand completed to bring patient out of flexor synergy pattern.  - AA/ROM: elbow, flexion, extension, using tray table, OT provided support and correct UE positioning to elbow, muscle tapping performed to facilitate muscle activation, hand resting on washcloth, 5X. - Attempted active wrist flexion and extension in a gravity eliminated plane, muscle tapping used, no active movement or muscle firing noted.  - Bed mobility: sit to supine, rolling to the left, rolling to the right, min assist with VC and visual cues.  - AA/ROM: powder board, sidelying, shoulder protraction, retraction; muscle tapping; 5X.  - AA/ROM: powder board, sidelying, elbow flexion, extension, muscle  tapping, 3X.    A: Demonstrated improvement with functional transfer based on OT session on Friday. Able to maintain static sitting balance while seated on the EOM. Pt was able to demonstrate both active shoulder and elbow within a gravity eliminated plane. No active elbow extension or shoulder retraction noted, requiring OT to provide movement passively. Patient was able to demonstrate active elbow flexion to 90 degrees in sidelying before requiring additional physical assist for remainder of movement. Able to also demonstrate approximately 50% of shoulder protraction before requiring assist for remainder of movement.    P: Continue with facilitating active movement in a gravity eliminated plane. Try FES and vibration. Vibration may also help to decrease tone .    Therapy Documentation Precautions:  Precautions Precaution Comments: R hemiparesis, apraxic, global aphasia, diplopia Restrictions Weight Bearing Restrictions: No RLE Weight Bearing: Weight bearing as tolerated  Pain:  0/10 verbalized at start of session using communication sheet   Therapy/Group: Individual Therapy  Ailene Ravel, OTR/L,CBIS  Supplemental OT - [facility]  07/04/2021, 12:01 PM

## 2021-07-04 NOTE — Plan of Care (Signed)
  Problem: RH Tub/Shower Transfers Goal: LTG Patient will perform tub/shower transfers w/assist (OT) Description: LTG: Patient will perform tub/shower transfers with assist, with/without cues using equipment (OT) Outcome: Not Applicable Note: DC as anticipate pt will be completing sponge bathes with family upon DC.   Problem: RH Balance Goal: LTG: Patient will maintain dynamic sitting balance (OT) Description: LTG:  Patient will maintain dynamic sitting balance with assistance during activities of daily living (OT) Flowsheets (Taken 07/04/2021 1634) LTG: Pt will maintain dynamic sitting balance during ADLs with: Minimal Assistance - Patient > 75% Note: Downgraded due to slower than anticipated progress. Goal: LTG Patient will maintain dynamic standing with ADLs (OT) Description: LTG:  Patient will maintain dynamic standing balance with assist during activities of daily living (OT)  Flowsheets (Taken 07/04/2021 1634) LTG: Pt will maintain dynamic standing balance during ADLs with: Maximal Assistance - Patient 25 - 49% Note: Downgraded due to slower than anticipated progress.   Problem: Sit to Stand Goal: LTG:  Patient will perform sit to stand in prep for activites of daily living with assistance level (OT) Description: LTG:  Patient will perform sit to stand in prep for activites of daily living with assistance level (OT) Flowsheets (Taken 07/04/2021 1634) LTG: PT will perform sit to stand in prep for activites of daily living with assistance level: Moderate Assistance - Patient 50 - 74%   Problem: RH Grooming Goal: LTG Patient will perform grooming w/assist,cues/equip (OT) Description: LTG: Patient will perform grooming with assist, with/without cues using equipment (OT) Flowsheets (Taken 07/04/2021 1634) LTG: Pt will perform grooming with assistance level of: Minimal Assistance - Patient > 75% Note: Downgraded due to slower than anticipated progress.   Problem: RH Bathing Goal: LTG  Patient will bathe all body parts with assist levels (OT) Description: LTG: Patient will bathe all body parts with assist levels (OT) Flowsheets (Taken 07/04/2021 1634) LTG: Pt will perform bathing with assistance level/cueing: Moderate Assistance - Patient 50 - 74% Note: Downgraded due to slower than anticipated progress.   Problem: RH Dressing Goal: LTG Patient will perform upper body dressing (OT) Description: LTG Patient will perform upper body dressing with assist, with/without cues (OT). Flowsheets (Taken 07/04/2021 1634) LTG: Pt will perform upper body dressing with assistance level of: Moderate Assistance - Patient 50 - 74% Note: Downgraded due to slower than anticipated progress. Goal: LTG Patient will perform lower body dressing w/assist (OT) Description: LTG: Patient will perform lower body dressing with assist, with/without cues in positioning using equipment (OT) Flowsheets (Taken 07/04/2021 1634) LTG: Pt will perform lower body dressing with assistance level of: Maximal Assistance - Patient 25 - 49% Note: Downgraded due to slower than anticipated progress.   Problem: RH Toileting Goal: LTG Patient will perform toileting task (3/3 steps) with assistance level (OT) Description: LTG: Patient will perform toileting task (3/3 steps) with assistance level (OT)  Flowsheets (Taken 07/04/2021 1634) LTG: Pt will perform toileting task (3/3 steps) with assistance level: Total Assistance - Patient < 25% Note: Downgraded due to slower than anticipated progress.

## 2021-07-04 NOTE — Discharge Summary (Signed)
Physician Discharge Summary  Patient ID: Henry Simmons MRN: [id number] DOB/AGE: 53-Jun-1972 53 y.o.  Admit date: 06/14/2021 Discharge date: 07/07/2021  Discharge Diagnoses:  Principal Problem:   Acute ischemic left MCA stroke [facility]) Active Problems:   Right hemiplegia ([place])   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)   Left middle cerebral artery stroke (Henry Simmons) DVT prophylaxis CAD/LAD stent/non-STEMI Hypertrophic cardiomyopathy/bradycardia Hypertension CKD stage III Hyperlipidemia Dysphagia  Discharged Condition: Stable  Significant Diagnostic Studies: DG Abd 1 View  Result Date: 06/11/2021 CLINICAL DATA:  Feeding difficulty EXAM: ABDOMEN - 1 VIEW COMPARISON:  06/08/2021 FINDINGS: Enteric tube remains positioned with distal tip terminating at the level of the distal stomach or proximal duodenum. Partially visualized right femoral line. Nonobstructive bowel gas pattern. IMPRESSION: Enteric tube tip at the level of the distal stomach or proximal duodenum. Nonobstructive bowel gas pattern. Electronically Signed   By: Davina Poke D.O.   On: 06/11/2021 10:55   CT HEAD WO CONTRAST (5MM)  Result Date: 06/09/2021 CLINICAL DATA:  Stroke follow-up.  Post left MCA thrombolysis. EXAM: CT HEAD WITHOUT CONTRAST TECHNIQUE: Contiguous axial images were obtained from the base of the skull through the vertex without intravenous contrast. RADIATION DOSE REDUCTION: This exam was performed according to the departmental dose-optimization program which includes automated exposure control, adjustment of the mA and/or kV according to patient size and/or use of iterative reconstruction technique. COMPARISON:  CT head 06/07/2021.  Limited MRI head 06/08/2021 FINDINGS: Brain: Interval development of a wedge-shaped hypodensity in the left posterior frontal lobe. This extends down to the insula. This is compatible with a moderately large acute infarct. No associated hemorrhagic transformation. No acute hemorrhage. Extensive  white matter hypodensity diffusely compatible with chronic ischemia. Chronic cortical infarct in the left medial frontal lobe unchanged. Ventricle size normal.  No midline shift Vascular: Negative for hyperdense vessel Skull: Negative Sinuses/Orbits: Mild mucosal edema right sphenoid sinus. Remaining sinuses clear. Negative orbit Other: None IMPRESSION: Moderately large area of infarct in the left posterior frontal lobe extending down to the frontal operculum and insula. Infarct territory similar to that seen on the MRI yesterday. No hemorrhagic transformation identified. Extensive chronic microvascular ischemic change in the white matter. Electronically Signed   By: Franchot Gallo M.D.   On: 06/09/2021 16:03   CT Head Wo Contrast  Result Date: 06/07/2021 CLINICAL DATA:  Altered mental status EXAM: CT HEAD WITHOUT CONTRAST TECHNIQUE: Contiguous axial images were obtained from the base of the skull through the vertex without intravenous contrast. RADIATION DOSE REDUCTION: This exam was performed according to the departmental dose-optimization program which includes automated exposure control, adjustment of the mA and/or kV according to patient size and/or use of iterative reconstruction technique. COMPARISON:  CT head 10/24/2012 FINDINGS: Brain: No acute intracranial hemorrhage, mass effect, or herniation. No extra-axial fluid collections. No evidence of acute territorial infarct. No hydrocephalus. Patchy hypodensities throughout the periventricular and subcortical white matter, likely secondary to chronic microvascular ischemic changes. Small old left pericallosal infarct, small old infarct in the anterior left frontal lobe, and old bilateral basal ganglia lacunar infarcts. Tiny likely old infarct in the right cerebellum. Vascular: No hyperdense vessel or unexpected calcification. Skull: Normal. Negative for fracture or focal lesion. Sinuses/Orbits: Moderate mucosal thickening and opacification of the right  sphenoid sinus. Other: None. IMPRESSION: No acute intracranial process identified. Multiple chronic findings as described. Electronically Signed   By: Ofilia Neas M.D.   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 EXAM: MRI HEAD WITHOUT CONTRAST MRA HEAD WITHOUT CONTRAST TECHNIQUE: Multiplanar, multi-echo pulse sequences of the brain and surrounding structures were acquired without intravenous contrast. Angiographic images of the Circle of Willis were acquired using MRA technique without intravenous contrast. COMPARISON:  No pertinent prior exam. FINDINGS: MRI HEAD FINDINGS Severely limited and incomplete study. Only axial and coronal diffusion-weighted images were obtained and the are significantly degraded by motion. Bilateral large frontoparietal areas of hyperintensity are seen on DWI. However, corresponding low ADC is only seen on the left and in a small area in the right parietal high convexity. MRA HEAD FINDINGS Severely limited study due to motion artifact. The study is essentially nondiagnostic. Flow related enhancement noted in the left vertebral artery, basilar artery and bilateral internal carotid arteries. Evaluation for stenosis or branch occlusion not possible due to image quality. IMPRESSION: Essentially nondiagnostic MRI and MRA of the brain. Suggestion of large left MCA territory infarct and small right parietal infarct. Electronically Signed   By: Baldemar Lenis M.D.   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 EXAM: MRI HEAD WITHOUT CONTRAST MRA HEAD WITHOUT CONTRAST TECHNIQUE: Multiplanar, multi-echo pulse sequences of the brain and surrounding structures were acquired without  intravenous contrast. Angiographic images of the Circle of Willis were acquired using MRA technique without intravenous contrast. COMPARISON:  No pertinent prior exam. FINDINGS: MRI HEAD FINDINGS Severely limited and incomplete study. Only axial and coronal diffusion-weighted images were obtained and the are significantly degraded by motion. Bilateral large frontoparietal areas of hyperintensity are seen on DWI. However, corresponding low ADC is only seen on the left and in a small area in the right parietal high convexity. MRA HEAD FINDINGS Severely limited study due to motion artifact. The study is essentially nondiagnostic. Flow related enhancement noted in the left vertebral artery, basilar artery and bilateral internal carotid arteries. Evaluation for stenosis or branch occlusion not possible due to image quality. IMPRESSION: Essentially nondiagnostic MRI and MRA of the brain. Suggestion of large left MCA territory infarct and small right parietal infarct. Electronically Signed   By: Baldemar Lenis M.D.   On: 06/10/2021 12:19   CARDIAC CATHETERIZATION  Result Date: 06/07/2021   Mid LM to Dist LM lesion is 25% stenosed.   Ramus lesion is 80% stenosed.   2nd Diag lesion is 20% stenosed.   Prox LAD lesion is 60% stenosed.   Dist LAD lesion is 80% stenosed.   1st Mrg lesion is 80% stenosed.   Previously placed Mid LAD stent (unknown type) is  widely patent.   The left ventricular ejection fraction is 35-45% by visual estimate. Stroke work up in progress. Per wife patient had stroke symptoms wax and wane since this morning prior to arrival to ER. Medical treatment for now.   IR 3D Independent Wkst  Result Date: 06/09/2021 INDICATION: New onset of global aphasia, right-sided weakness and left gaze deviation. EXAM: 1. EMERGENT LARGE VESSEL OCCLUSION THROMBOLYSIS (anterior COMPARISON:  CT scan of the brain June 07, 2021, and MRI of the brain of June 07, 2021. MEDICATIONS: Ancef 2 g IV  antibiotic was administered within 1 hour of the procedure. ANESTHESIA/SEDATION: General anesthesia. CONTRAST:  Omnipaque 300 140 mL. FLUOROSCOPY TIME:  Fluoroscopy Time: 23 minutes 30 seconds (2284 mGy). COMPLICATIONS: None immediate.  TECHNIQUE: Following a full explanation of the procedure along with the potential associated complications, an informed witnessed consent was obtained. The risks of intracranial hemorrhage of 10%, worsening neurological deficit, ventilator dependency, death and inability to revascularize were all reviewed in detail with the patient's wife. The patient was then put under general anesthesia by the Department of Anesthesiology at [facility]. The right groin was prepped and draped in the usual sterile fashion. Thereafter using modified Seldinger technique, transfemoral access into the right common femoral artery was obtained without difficulty. Over a 0.035 inch guidewire an 8 [place] Pinnacle sheath was inserted. Through this, and also over a 0.035 inch guidewire a 5 [place] JB 1 catheter was advanced to the aortic arch region and selectively positioned in the right subclavian artery, the right common carotid artery, the left common carotid artery and the left vertebral artery. A 3D rotational arteriogram was performed of the left anterior circulation via the left common carotid arteriogram. 3D reformat images were then obtained on a separate workstation. FINDINGS: The right subclavian arteriogram demonstrates a 50% stenosis of the right vertebral artery at its origin. The vessel opacifies to the cranial skull base to the right vertebrobasilar junction. No discrete right posterior-inferior cerebellar artery was identifiable. The right common carotid arteriogram demonstrates the right external carotid artery and its major branches to be widely patent. The right internal carotid artery at the bulb to the cranial skull base demonstrates wide patency with mild tortuosity at the  junction of the middle and proximal 1/3. More distally, the petrous and the proximal cavernous segments demonstrate wide patency. There is mild stenosis of approximately 30% of the right ICA supraclinoid segment. The right middle cerebral artery and the right anterior cerebral artery opacify into the capillary and venous phases. There is approximately less than 50% stenosis at the origin of the right middle cerebral artery. Diffuse moderate to severe intracranial arteriosclerotic changes are evident of the branches of the right middle cerebral artery and the right anterior cerebral artery most notably of the inferior division of the right middle cerebral artery in the M3 M4 regions. Prompt opacification of the left anterior cerebral artery via the anterior communicating artery is seen also with moderate to moderately severe intracranial arteriosclerotic changes involving the callosal marginal, and the pericallosal arteries. Occlusion of the left anterior cerebral artery distal A1 segment is noted. Left vertebral artery origin is widely patent. The vessel opacifies to the cranial skull base. Wide patency is seen of the left vertebrobasilar junction and the left anterior inferior cerebellar artery posteroinferior cerebellar artery complex. The basilar artery demonstrates a high-grade stenosis in its proximal 1/3. More distally, the basilar artery, the posterior cerebral arteries, the superior cerebellar arteries and the anterior-inferior cerebellar arteries demonstrate patency into the capillary and venous phases. Multiple focal areas of moderate caliber irregularity of the posterior cerebral arteries denotes intracranial arteriosclerosis. The left common carotid arteriogram demonstrates the left external carotid artery and its major branches to be widely patent. The left internal carotid artery at the bulb to the cranial skull base is widely patent. 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 The 3D reformation images confirmed the above findings. 50% stenosis is noted of the supraclinoid left ICA. The left middle cerebral artery opacifies into the capillary and venous phases. Multiple focal  areas of caliber irregularity are seen involving the superior and inferior divisions of the left middle cerebral artery in the proximal M2 and M3 regions. A moderate sized focal area of hypoperfusion is seen involving the anterior parietal region of the left MCA inferior division. PROCEDURE: The diagnostic JB 1 catheter in left common carotid artery was then exchanged over a 0.035 inch 300 cm Rosen exchange guidewire for a 95 cm 087 balloon guide catheter which had been prepped with 50% contrast and 50% heparinized saline infusion and advanced to the proximal left internal carotid artery. The guidewire was removed. Good aspiration obtained from the hub of the balloon guide catheter. Gentle control arteriogram performed through this demonstrated no evidence of spasms, dissections or of filling defects. Over a 0.035 inch Roadrunner guidewire, a 115 cm 5 [place] Catalyst guide catheter was then advanced and positioned in the proximal petrous segment. The guidewire was removed. Good aspiration obtained from the hub of the 5 [place] Catalyst guide catheter. Control arteriogram was then performed which demonstrated no change in the intracranial circulation. Over an 018 inch Aristotle micro guidewire, an 027 Marksman microcatheter was advanced and positioned in the mid left M1 segment. The guidewire was removed. Good aspiration obtained from the hub of the microcatheter. A gentle control arteriogram performed through this demonstrated no evidence of spasms, dissections or of intraluminal filling defects. Super selective arteriograms were then performed with the 027  microcatheter in the proximal superior division of the left middle cerebral artery, and the posterior branches of the inferior division of the left middle cerebral artery. These demonstrated multiple focal areas of caliber irregularity into the M4 M5 regions. With the microcatheter in the anterior parietal branch of the inferior division of the left middle cerebral artery, the micro guidewire was then gently advanced in different directions in order to obtain anchorage and super select the suspected occluded branch in the M3 region. However, multiple attempts made were unsuccessful. The microcatheter, and the 5 [place] Catalyst guide catheter were retrieved and removed. A final control arteriogram performed through the balloon guide catheter in the left common carotid artery continued to demonstrate excellent flow in the left internal carotid artery proximally and distally with no change in the intracranial circulation. The balloon guide was removed as was the 8 [place] sheath. An 8 French Angio-Seal closure device was deployed for hemostasis at the right groin puncture site. Distal pulses remained Dopplerable in both feet unchanged at the end of the procedure. The patient was left intubated due to the patient's medical condition. He was then transferred to the heart ICU for further management. IMPRESSION: Status post endovascular superselective arteriograms of the left middle cerebral artery branches with confirmation of moderate to moderately severe intracranial arteriosclerotic changes. A TICI 2C revascularization of the left MCA territory was maintained. 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 Multiple focal areas of moderate to moderately severe intracranial arteriosclerosis in the anterior and posterior circulations as described above. PLAN: Follow-up in the clinic 4 weeks post discharge. Electronically Signed   By: Luanne Bras M.D.   On: 06/09/2021 08:30   DG CHEST PORT 1 VIEW  Result Date:  06/07/2021 CLINICAL DATA:  Bradycardia EXAM: PORTABLE CHEST 1 VIEW COMPARISON:  06/07/2021 FINDINGS: Endotracheal tube 5 cm above the carina. NG tube in the stomach. Mild cardiomegaly. Pacer wire enters the heart from the IVC and passes into the right ventricle. Lungs clear. No effusions. No acute bony abnormality. IMPRESSION: Support devices as above. Cardiomegaly.  No active disease.  Electronically Signed   By: Rolm Baptise M.D.   On: 06/07/2021 22:33   DG Chest Portable 1 View  Result Date: 06/07/2021 CLINICAL DATA:  Altered mental status. EXAM: PORTABLE CHEST 1 VIEW COMPARISON:  November 29, 2020. FINDINGS: Stable cardiomegaly. Both lungs are clear. The visualized skeletal structures are unremarkable. IMPRESSION: No active disease. Electronically Signed   By: Marijo Conception M.D.   On: 06/07/2021 09:11   DG Abd Portable 1V  Result Date: 06/08/2021 CLINICAL DATA:  NG tube placement. EXAM: PORTABLE ABDOMEN - 1 VIEW COMPARISON:  Radiograph earlier today FINDINGS: Previous enteric tube has been removed, there is placement of a weighted enteric tube. Tip is in the right upper quadrant in the region of the distal stomach or proximal duodenum. Right femoral transvenous pacing device is unchanged tip projecting over the right ventricle. Air throughout nondilated small and large bowel, nonspecific pattern. IMPRESSION: Tip of the weighted enteric tube in the right upper quadrant in the region of the distal stomach or proximal duodenum. Electronically Signed   By: Keith Rake M.D.   On: 06/08/2021 18:51   DG Abd Portable 1V  Result Date: 06/08/2021 CLINICAL DATA:  Nasogastric tube placement. EXAM: PORTABLE ABDOMEN - 1 VIEW COMPARISON:  06/07/2021. FINDINGS: Nasogastric tube is coiled on itself in the stomach, terminating in the distal esophagus. Transvenous pacemaker lead tip, from an IVC approach, projects over the right ventricle. Lung bases are clear. Visualized upper abdomen is unremarkable. IMPRESSION:  Nasogastric tube is coiled on itself in the stomach, terminating in the distal esophagus. Electronically Signed   By: Lorin Picket M.D.   On: 06/08/2021 09:28   DG Abd Portable 1V  Result Date: 06/07/2021 CLINICAL DATA:  OG tube placement EXAM: PORTABLE ABDOMEN - 1 VIEW COMPARISON:  None. FINDINGS: OG tube tip in the stomach with the side port in the distal esophagus. Pacer wire in the right ventricle. Mild cardiomegaly. Nonobstructive bowel gas pattern. IMPRESSION: OG tube tip in the proximal stomach. Electronically Signed   By: Rolm Baptise M.D.   On: 06/07/2021 22:33   ECHOCARDIOGRAM COMPLETE  Result Date: 06/07/2021    ECHOCARDIOGRAM REPORT   Patient Name:   TOMAZ GWYNN Date of Exam: 06/07/2021 Medical Rec #:  [id number]      Height:       74.0 in Accession #:    [id number]     Weight:       229.3 lb Date of Birth:  [birth year]      BSA:          2.303 m Patient Age:    50 years       BP:           114/67 mmHg Patient Gender: M              HR:           36 bpm. Exam Location:  Inpatient Procedure: 2D Echo, Cardiac Doppler and Color Doppler Indications:    Nstemi  History:        Patient has prior history of Echocardiogram examinations, most                 recent 11/29/2020. Risk Factors:Hypertension.  Sonographer:    Jefferey Pica Referring Phys: [place]  1. Left ventricular ejection fraction, by estimation, is 40 to 45%. The left ventricle has mildly decreased function. The left ventricle demonstrates regional wall motion abnormalities (see scoring diagram/findings for description). There is severe concentric  left ventricular hypertrophy. Left ventricular diastolic parameters are consistent with Grade I diastolic dysfunction (impaired relaxation). There is severe hypokinesis of the left ventricular, entire inferior wall and inferoseptal wall.  2. Right ventricular systolic function is normal. The right ventricular size is normal. There is normal pulmonary artery systolic  pressure.  3. Left atrial size was mildly dilated.  4. Right atrial size was mildly dilated.  5. The mitral valve is normal in structure. Moderate mitral valve regurgitation.  6. The aortic valve is tricuspid. Aortic valve regurgitation is not visualized.  7. The inferior vena cava is normal in size with <50% respiratory variability, suggesting right atrial pressure of 8 mmHg. Conclusion(s)/Recommendation(s): Findings consistent with ischemic cardiomyopathy. Cardiac interventions. FINDINGS  Left Ventricle: Left ventricular ejection fraction, by estimation, is 40 to 45%. The left ventricle has mildly decreased function. The left ventricle demonstrates regional wall motion abnormalities. Severe hypokinesis of the left ventricular, entire inferior wall and inferoseptal wall. The left ventricular internal cavity size was normal in size. There is severe concentric left ventricular hypertrophy. Left ventricular diastolic parameters are consistent with Grade I diastolic dysfunction (impaired relaxation).  LV Wall Scoring: The entire inferior wall and posterior wall are hypokinetic. The entire anterior wall, antero-lateral wall, entire septum, apical lateral segment, and apex are normal. Right Ventricle: The right ventricular size is normal. No increase in right ventricular wall thickness. Right ventricular systolic function is normal. There is normal pulmonary artery systolic pressure. The tricuspid regurgitant velocity is 1.50 m/s, and  with an assumed right atrial pressure of 15 mmHg, the estimated right ventricular systolic pressure is 24.0 mmHg. Left Atrium: Left atrial size was mildly dilated. Right Atrium: Right atrial size was mildly dilated. Pericardium: There is no evidence of pericardial effusion. Mitral Valve: The mitral valve is normal in structure. Moderate mitral valve regurgitation. Tricuspid Valve: The tricuspid valve is normal in structure. Tricuspid valve regurgitation is mild. Aortic Valve: The aortic  valve is tricuspid. Aortic valve regurgitation is not visualized. Aortic valve peak gradient measures 11.2 mmHg. Pulmonic Valve: The pulmonic valve was normal in structure. Pulmonic valve regurgitation is not visualized. Aorta: The aortic root is normal in size and structure. There is minimal (Grade I) atheroma plaque involving the ascending aorta and aortic root. Venous: The inferior vena cava is normal in size with less than 50% respiratory variability, suggesting right atrial pressure of 8 mmHg. IAS/Shunts: The atrial septum is grossly normal.  LEFT VENTRICLE PLAX 2D LVIDd:         5.70 cm LVIDs:         4.50 cm LV PW:         1.80 cm LV IVS:        1.70 cm LVOT diam:     2.20 cm LV SV:         78 LV SV Index:   34 LVOT Area:     3.80 cm  RIGHT VENTRICLE          IVC RV Basal diam:  3.30 cm  IVC diam: 2.10 cm TAPSE (M-mode): 2.3 cm LEFT ATRIUM             Index        RIGHT ATRIUM           Index LA diam:        4.30 cm 1.87 cm/m   RA Area:     18.80 cm LA Vol (A2C):   75.5 ml 32.78 ml/m  RA Volume:  54.00 ml  23.44 ml/m LA Vol (A4C):   81.9 ml 35.56 ml/m LA Biplane Vol: 80.7 ml 35.04 ml/m  AORTIC VALVE                 PULMONIC VALVE AV Area (Vmax): 2.64 cm     PV Vmax:       0.95 m/s AV Vmax:        167.00 cm/s  PV Peak grad:  3.6 mmHg AV Peak Grad:   11.2 mmHg LVOT Vmax:      116.00 cm/s LVOT Vmean:     63.600 cm/s LVOT VTI:       0.206 m  AORTA Ao Root diam: 3.80 cm Ao Asc diam:  3.30 cm MITRAL VALVE                TRICUSPID VALVE MV Area (PHT): 2.99 cm     TR Peak grad:   9.0 mmHg MV Decel Time: 254 msec     TR Vmax:        150.00 cm/s MV E velocity: 66.70 cm/s MV A velocity: 108.00 cm/s  SHUNTS MV E/A ratio:  0.62         Systemic VTI:  0.21 m                             Systemic Diam: 2.20 cm Dixie Dials MD Electronically signed by Dixie Dials MD Signature Date/Time: 06/07/2021/7:37:50 PM    Final    IR PERCUTANEOUS ART THROMBECTOMY/INFUSION INTRACRANIAL INC DIAG ANGIO  Result Date:  06/09/2021 INDICATION: New onset of global aphasia, right-sided weakness and left gaze deviation. EXAM: 1. EMERGENT LARGE VESSEL OCCLUSION THROMBOLYSIS (anterior COMPARISON:  CT scan of the brain June 07, 2021, and MRI of the brain of June 07, 2021. MEDICATIONS: Ancef 2 g IV antibiotic was administered within 1 hour of the procedure. ANESTHESIA/SEDATION: General anesthesia. CONTRAST:  Omnipaque 300 140 mL. FLUOROSCOPY TIME:  Fluoroscopy Time: 23 minutes 30 seconds (2284 mGy). COMPLICATIONS: None immediate. TECHNIQUE: Following a full explanation of the procedure along with the potential associated complications, an informed witnessed consent was obtained. The risks of intracranial hemorrhage of 10%, worsening neurological deficit, ventilator dependency, death and inability to revascularize were all reviewed in detail with the patient's wife. The patient was then put under general anesthesia by the Department of Anesthesiology at [facility]. The right groin was prepped and draped in the usual sterile fashion. Thereafter using modified Seldinger technique, transfemoral access into the right common femoral artery was obtained without difficulty. Over a 0.035 inch guidewire an 8 [place] Pinnacle sheath was inserted. Through this, and also over a 0.035 inch guidewire a 5 [place] JB 1 catheter was advanced to the aortic arch region and selectively positioned in the right subclavian artery, the right common carotid artery, the left common carotid artery and the left vertebral artery. A 3D rotational arteriogram was performed of the left anterior circulation via the left common carotid arteriogram. 3D reformat images were then obtained on a separate workstation. FINDINGS: The right subclavian arteriogram demonstrates a 50% stenosis of the right vertebral artery at its origin. The vessel opacifies to the cranial skull base to the right vertebrobasilar junction. No discrete right posterior-inferior cerebellar artery  was identifiable. The right common carotid arteriogram demonstrates the right external carotid artery and its major branches to be widely patent. The right internal carotid artery at the bulb to the cranial skull base demonstrates  wide patency with mild tortuosity at the junction of the middle and proximal 1/3. More distally, the petrous and the proximal cavernous segments demonstrate wide patency. There is mild stenosis of approximately 30% of the right ICA supraclinoid segment. The right middle cerebral artery and the right anterior cerebral artery opacify into the capillary and venous phases. There is approximately less than 50% stenosis at the origin of the right middle cerebral artery. Diffuse moderate to severe intracranial arteriosclerotic changes are evident of the branches of the right middle cerebral artery and the right anterior cerebral artery most notably of the inferior division of the right middle cerebral artery in the M3 M4 regions. Prompt opacification of the left anterior cerebral artery via the anterior communicating artery is seen also with moderate to moderately severe intracranial arteriosclerotic changes involving the callosal marginal, and the pericallosal arteries. Occlusion of the left anterior cerebral artery distal A1 segment is noted. Left vertebral artery origin is widely patent. The vessel opacifies to the cranial skull base. Wide patency is seen of the left vertebrobasilar junction and the left anterior inferior cerebellar artery posteroinferior cerebellar artery complex. The basilar artery demonstrates a high-grade stenosis in its proximal 1/3. More distally, the basilar artery, the posterior cerebral arteries, the superior cerebellar arteries and the anterior-inferior cerebellar arteries demonstrate patency into the capillary and venous phases. Multiple focal areas of moderate caliber irregularity of the posterior cerebral arteries denotes intracranial arteriosclerosis. The left  common carotid arteriogram demonstrates the left external carotid artery and its major branches to be widely patent. The left internal carotid artery at the bulb to the cranial skull base is widely patent. 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 The 3D reformation images confirmed the above findings. 50% stenosis is noted of the supraclinoid left ICA. The left middle cerebral artery opacifies into the capillary and venous phases. Multiple focal areas of caliber irregularity are seen involving the superior and inferior divisions of the left middle cerebral artery in the proximal M2 and M3 regions. A moderate sized focal area of hypoperfusion is seen involving the anterior parietal region of the left MCA inferior division. PROCEDURE: The diagnostic JB 1 catheter in left common carotid artery was then exchanged over a 0.035 inch 300 cm Rosen exchange guidewire for a 95 cm 087 balloon guide catheter which had been prepped with 50% contrast and 50% heparinized saline infusion and advanced to the proximal left internal carotid artery. The guidewire was removed. Good aspiration obtained from the hub of the balloon guide catheter. Gentle control arteriogram performed through this demonstrated no evidence of spasms, dissections or of filling defects. Over a 0.035 inch Roadrunner guidewire, a 115 cm 5 [place] Catalyst guide catheter was then advanced and positioned in the proximal petrous segment. The guidewire was removed. Good aspiration obtained from the hub of the 5 [place] Catalyst guide catheter. Control arteriogram was then performed which demonstrated no change in the intracranial circulation. Over an 018 inch Aristotle micro guidewire, an 027 Marksman microcatheter was advanced and positioned in the mid left M1 segment. The guidewire was  removed. Good aspiration obtained from the hub of the microcatheter. A gentle control arteriogram performed through this demonstrated no evidence of spasms, dissections or of intraluminal filling defects. Super selective arteriograms were then performed with the 027 microcatheter in the proximal  superior division of the left middle cerebral artery, and the posterior branches of the inferior division of the left middle cerebral artery. These demonstrated multiple focal areas of caliber irregularity into the M4 M5 regions. With the microcatheter in the anterior parietal branch of the inferior division of the left middle cerebral artery, the micro guidewire was then gently advanced in different directions in order to obtain anchorage and super select the suspected occluded branch in the M3 region. However, multiple attempts made were unsuccessful. The microcatheter, and the 5 [place] Catalyst guide catheter were retrieved and removed. A final control arteriogram performed through the balloon guide catheter in the left common carotid artery continued to demonstrate excellent flow in the left internal carotid artery proximally and distally with no change in the intracranial circulation. The balloon guide was removed as was the 8 [place] sheath. An 8 French Angio-Seal closure device was deployed for hemostasis at the right groin puncture site. Distal pulses remained Dopplerable in both feet unchanged at the end of the procedure. The patient was left intubated due to the patient's medical condition. He was then transferred to the heart ICU for further management. IMPRESSION: Status post endovascular superselective arteriograms of the left middle cerebral artery branches with confirmation of moderate to moderately severe intracranial arteriosclerotic changes. A TICI 2C revascularization of the left MCA territory was maintained. 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 Multiple focal areas of moderate to moderately  severe intracranial arteriosclerosis in the anterior and posterior circulations as described above. PLAN: Follow-up in the clinic 4 weeks post discharge. Electronically Signed   By: Julieanne Cotton M.D.   On: 06/09/2021 08:30   CT HEAD CODE STROKE WO CONTRAST  Result Date: 06/07/2021 CLINICAL DATA:  Code stroke. 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 EXAM: CT HEAD WITHOUT CONTRAST TECHNIQUE: Contiguous axial images were obtained from the base of the skull through the vertex without intravenous contrast. RADIATION DOSE REDUCTION: This exam was performed according to the departmental dose-optimization program which includes automated exposure control, adjustment of the mA and/or kV according to patient size and/or use of iterative reconstruction technique. COMPARISON:  CT head this morning 06/07/2021 FINDINGS: Brain: Ventricle size and cerebral volume normal. No acute hemorrhage or mass. Extensive white matter hypodensity bilaterally unchanged. Small hypodensity right thalamus unchanged. 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 This is in the pericallosal region. No acute infarct, hemorrhage, mass Vascular: Contrast enhancement in the vessels due to cardiac catheterization today. Normal enhancement of the arteries and veins. Skull: Negative Sinuses/Orbits: Mild mucosal edema right sphenoid sinus. Remaining sinuses clear. Negative orbit Other: None ASPECTS (Alberta Stroke Program Early CT Score) - Ganglionic level infarction (caudate, lentiform nuclei, internal capsule, insula, M1-M3 cortex): 7 - Supraganglionic infarction (M4-M6 cortex): 3 Total score (0-10 with 10 being normal): 10 IMPRESSION: 1. No acute abnormality and no change from earlier today. 2. Extensive chronic ischemic changes. 3. Vascular enhancement due to cardiac catheterization today 4. ASPECTS is 10 5. These results were called by telephone at the time of  interpretation on 06/07/2021 at 4:42 pm to provider [facility] , who verbally acknowledged these results. Electronically Signed   By: Marlan Palau M.D.   On: 06/07/2021 16:44   CT ANGIO HEAD NECK W WO CM (CODE STROKE)  Result Date: 06/07/2021 CLINICAL DATA:  Left MCA symptoms. Post cardiac catheterization today EXAM: CT ANGIOGRAPHY HEAD AND NECK TECHNIQUE: Multidetector CT imaging of the head and neck was performed using the standard  protocol during bolus administration of intravenous contrast. Multiplanar CT image reconstructions and MIPs were obtained to evaluate the vascular anatomy. Carotid stenosis measurements (when applicable) are obtained utilizing NASCET criteria, using the distal internal carotid diameter as the denominator. RADIATION DOSE REDUCTION: This exam was performed according to the departmental dose-optimization program which includes automated exposure control, adjustment of the mA and/or kV according to patient size and/or use of iterative reconstruction technique. CONTRAST:  109mL OMNIPAQUE IOHEXOL 350 MG/ML SOLN Patient also received 100 mL of contrast in the cardiac catheterization lab today. COMPARISON:  CT head 06/07/2021 FINDINGS: CTA NECK FINDINGS Nondiagnostic study. There is no significant contrast in the neck vessels. Decision made by neurology not to pursue additional contrast infusion given the cardiac catheterization earlier today. There is venous contrast in the left subclavian vein but not the aortic arch suggesting early timing of the scan. CTA HEAD FINDINGS Nondiagnostic study.  Inadequate vascular enhancement for diagnosis. IMPRESSION: Nondiagnostic CTA head and neck. There is lack of significant contrast enhancement of the intracranial and extracranial vessels. Consider repeat CTA tomorrow. Electronically Signed   By: Franchot Gallo M.D.   On: 06/07/2021 18:13   IR ANGIO INTRA EXTRACRAN SEL COM CAROTID INNOMINATE UNI R MOD SED  Result Date: 06/09/2021 INDICATION:  New onset of global aphasia, right-sided weakness and left gaze deviation. EXAM: 1. EMERGENT LARGE VESSEL OCCLUSION THROMBOLYSIS (anterior COMPARISON:  CT scan of the brain June 07, 2021, and MRI of the brain of June 07, 2021. MEDICATIONS: Ancef 2 g IV antibiotic was administered within 1 hour of the procedure. ANESTHESIA/SEDATION: General anesthesia. CONTRAST:  Omnipaque 300 140 mL. FLUOROSCOPY TIME:  Fluoroscopy Time: 23 minutes 30 seconds (2284 mGy). COMPLICATIONS: None immediate. TECHNIQUE: Following a full explanation of the procedure along with the potential associated complications, an informed witnessed consent was obtained. The risks of intracranial hemorrhage of 10%, worsening neurological deficit, ventilator dependency, death and inability to revascularize were all reviewed in detail with the patient's wife. The patient was then put under general anesthesia by the Department of Anesthesiology at [facility]. The right groin was prepped and draped in the usual sterile fashion. Thereafter using modified Seldinger technique, transfemoral access into the right common femoral artery was obtained without difficulty. Over a 0.035 inch guidewire an 8 [place] Pinnacle sheath was inserted. Through this, and also over a 0.035 inch guidewire a 5 [place] JB 1 catheter was advanced to the aortic arch region and selectively positioned in the right subclavian artery, the right common carotid artery, the left common carotid artery and the left vertebral artery. A 3D rotational arteriogram was performed of the left anterior circulation via the left common carotid arteriogram. 3D reformat images were then obtained on a separate workstation. FINDINGS: The right subclavian arteriogram demonstrates a 50% stenosis of the right vertebral artery at its origin. The vessel opacifies to the cranial skull base to the right vertebrobasilar junction. No discrete right posterior-inferior cerebellar artery was identifiable. The  right common carotid arteriogram demonstrates the right external carotid artery and its major branches to be widely patent. The right internal carotid artery at the bulb to the cranial skull base demonstrates wide patency with mild tortuosity at the junction of the middle and proximal 1/3. More distally, the petrous and the proximal cavernous segments demonstrate wide patency. There is mild stenosis of approximately 30% of the right ICA supraclinoid segment. The right middle cerebral artery and the right anterior cerebral artery opacify into the capillary and venous phases. There is  approximately less than 50% stenosis at the origin of the right middle cerebral artery. Diffuse moderate to severe intracranial arteriosclerotic changes are evident of the branches of the right middle cerebral artery and the right anterior cerebral artery most notably of the inferior division of the right middle cerebral artery in the M3 M4 regions. Prompt opacification of the left anterior cerebral artery via the anterior communicating artery is seen also with moderate to moderately severe intracranial arteriosclerotic changes involving the callosal marginal, and the pericallosal arteries. Occlusion of the left anterior cerebral artery distal A1 segment is noted. Left vertebral artery origin is widely patent. The vessel opacifies to the cranial skull base. Wide patency is seen of the left vertebrobasilar junction and the left anterior inferior cerebellar artery posteroinferior cerebellar artery complex. The basilar artery demonstrates a high-grade stenosis in its proximal 1/3. More distally, the basilar artery, the posterior cerebral arteries, the superior cerebellar arteries and the anterior-inferior cerebellar arteries demonstrate patency into the capillary and venous phases. Multiple focal areas of moderate caliber irregularity of the posterior cerebral arteries denotes intracranial arteriosclerosis. The left common carotid  arteriogram demonstrates the left external carotid artery and its major branches to be widely patent. The left internal carotid artery at the bulb to the cranial skull base is widely patent. 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 The 3D reformation images confirmed the above findings. 50% stenosis is noted of the supraclinoid left ICA. The left middle cerebral artery opacifies into the capillary and venous phases. Multiple focal areas of caliber irregularity are seen involving the superior and inferior divisions of the left middle cerebral artery in the proximal M2 and M3 regions. A moderate sized focal area of hypoperfusion is seen involving the anterior parietal region of the left MCA inferior division. PROCEDURE: The diagnostic JB 1 catheter in left common carotid artery was then exchanged over a 0.035 inch 300 cm Rosen exchange guidewire for a 95 cm 087 balloon guide catheter which had been prepped with 50% contrast and 50% heparinized saline infusion and advanced to the proximal left internal carotid artery. The guidewire was removed. Good aspiration obtained from the hub of the balloon guide catheter. Gentle control arteriogram performed through this demonstrated no evidence of spasms, dissections or of filling defects. Over a 0.035 inch Roadrunner guidewire, a 115 cm 5 [place] Catalyst guide catheter was then advanced and positioned in the proximal petrous segment. The guidewire was removed. Good aspiration obtained from the hub of the 5 [place] Catalyst guide catheter. Control arteriogram was then performed which demonstrated no change in the intracranial circulation. Over an 018 inch Aristotle micro guidewire, an 027 Marksman microcatheter was advanced and positioned in the mid left M1 segment. The guidewire was removed. Good  aspiration obtained from the hub of the microcatheter. A gentle control arteriogram performed through this demonstrated no evidence of spasms, dissections or of intraluminal filling defects. Super selective arteriograms were then performed with the 027 microcatheter in the proximal superior division of the left middle cerebral artery, and the posterior branches of the inferior division of the left middle cerebral artery. These demonstrated multiple focal areas of caliber irregularity into the M4 M5 regions. With the microcatheter in the anterior parietal branch of the inferior division of the left middle cerebral artery, the micro guidewire was then gently  advanced in different directions in order to obtain anchorage and super select the suspected occluded branch in the M3 region. However, multiple attempts made were unsuccessful. The microcatheter, and the 5 [place] Catalyst guide catheter were retrieved and removed. A final control arteriogram performed through the balloon guide catheter in the left common carotid artery continued to demonstrate excellent flow in the left internal carotid artery proximally and distally with no change in the intracranial circulation. The balloon guide was removed as was the 8 [place] sheath. An 8 French Angio-Seal closure device was deployed for hemostasis at the right groin puncture site. Distal pulses remained Dopplerable in both feet unchanged at the end of the procedure. The patient was left intubated due to the patient's medical condition. He was then transferred to the heart ICU for further management. IMPRESSION: Status post endovascular superselective arteriograms of the left middle cerebral artery branches with confirmation of moderate to moderately severe intracranial arteriosclerotic changes. A TICI 2C revascularization of the left MCA territory was maintained. 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 Multiple focal areas of moderate to moderately severe  intracranial arteriosclerosis in the anterior and posterior circulations as described above. PLAN: Follow-up in the clinic 4 weeks post discharge. Electronically Signed   By: Luanne Bras M.D.   On: 06/09/2021 08:30   IR ANGIO VERTEBRAL SEL VERTEBRAL UNI L MOD SED  Result Date: 06/09/2021 INDICATION: New onset of global aphasia, right-sided weakness and left gaze deviation. EXAM: 1. EMERGENT LARGE VESSEL OCCLUSION THROMBOLYSIS (anterior COMPARISON:  CT scan of the brain June 07, 2021, and MRI of the brain of June 07, 2021. MEDICATIONS: Ancef 2 g IV antibiotic was administered within 1 hour of the procedure. ANESTHESIA/SEDATION: General anesthesia. CONTRAST:  Omnipaque 300 140 mL. FLUOROSCOPY TIME:  Fluoroscopy Time: 23 minutes 30 seconds (2284 mGy). COMPLICATIONS: None immediate. TECHNIQUE: Following a full explanation of the procedure along with the potential associated complications, an informed witnessed consent was obtained. The risks of intracranial hemorrhage of 10%, worsening neurological deficit, ventilator dependency, death and inability to revascularize were all reviewed in detail with the patient's wife. The patient was then put under general anesthesia by the Department of Anesthesiology at [facility]. The right groin was prepped and draped in the usual sterile fashion. Thereafter using modified Seldinger technique, transfemoral access into the right common femoral artery was obtained without difficulty. Over a 0.035 inch guidewire an 8 [place] Pinnacle sheath was inserted. Through this, and also over a 0.035 inch guidewire a 5 [place] JB 1 catheter was advanced to the aortic arch region and selectively positioned in the right subclavian artery, the right common carotid artery, the left common carotid artery and the left vertebral artery. A 3D rotational arteriogram was performed of the left anterior circulation via the left common carotid arteriogram. 3D reformat images were then  obtained on a separate workstation. FINDINGS: The right subclavian arteriogram demonstrates a 50% stenosis of the right vertebral artery at its origin. The vessel opacifies to the cranial skull base to the right vertebrobasilar junction. No discrete right posterior-inferior cerebellar artery was identifiable. The right common carotid arteriogram demonstrates the right external carotid artery and its major branches to be widely patent. The right internal carotid artery at the bulb to the cranial skull base demonstrates wide patency with mild tortuosity at the junction of the middle and proximal 1/3. More distally, the petrous and the proximal cavernous segments demonstrate wide patency. There is mild stenosis of approximately 30% of the right ICA supraclinoid  segment. The right middle cerebral artery and the right anterior cerebral artery opacify into the capillary and venous phases. There is approximately less than 50% stenosis at the origin of the right middle cerebral artery. Diffuse moderate to severe intracranial arteriosclerotic changes are evident of the branches of the right middle cerebral artery and the right anterior cerebral artery most notably of the inferior division of the right middle cerebral artery in the M3 M4 regions. Prompt opacification of the left anterior cerebral artery via the anterior communicating artery is seen also with moderate to moderately severe intracranial arteriosclerotic changes involving the callosal marginal, and the pericallosal arteries. Occlusion of the left anterior cerebral artery distal A1 segment is noted. Left vertebral artery origin is widely patent. The vessel opacifies to the cranial skull base. Wide patency is seen of the left vertebrobasilar junction and the left anterior inferior cerebellar artery posteroinferior cerebellar artery complex. The basilar artery demonstrates a high-grade stenosis in its proximal 1/3. More distally, the basilar artery, the posterior  cerebral arteries, the superior cerebellar arteries and the anterior-inferior cerebellar arteries demonstrate patency into the capillary and venous phases. Multiple focal areas of moderate caliber irregularity of the posterior cerebral arteries denotes intracranial arteriosclerosis. The left common carotid arteriogram demonstrates the left external carotid artery and its major branches to be widely patent. The left internal carotid artery at the bulb to the cranial skull base is widely patent. 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 The 3D reformation images confirmed the above findings. 50% stenosis is noted of the supraclinoid left ICA. The left middle cerebral artery opacifies into the capillary and venous phases. Multiple focal areas of caliber irregularity are seen involving the superior and inferior divisions of the left middle cerebral artery in the proximal M2 and M3 regions. A moderate sized focal area of hypoperfusion is seen involving the anterior parietal region of the left MCA inferior division. PROCEDURE: The diagnostic JB 1 catheter in left common carotid artery was then exchanged over a 0.035 inch 300 cm Rosen exchange guidewire for a 95 cm 087 balloon guide catheter which had been prepped with 50% contrast and 50% heparinized saline infusion and advanced to the proximal left internal carotid artery. The guidewire was removed. Good aspiration obtained from the hub of the balloon guide catheter. Gentle control arteriogram performed through this demonstrated no evidence of spasms, dissections or of filling defects. Over a 0.035 inch Roadrunner guidewire, a 115 cm 5 [place] Catalyst guide catheter was then advanced and positioned in the proximal petrous segment. The guidewire was removed. Good aspiration obtained from the hub  of the 5 [place] Catalyst guide catheter. Control arteriogram was then performed which demonstrated no change in the intracranial circulation. Over an 018 inch Aristotle micro guidewire, an 027 Marksman microcatheter was advanced and positioned in the mid left M1 segment. The guidewire was removed. Good aspiration obtained from the hub of the microcatheter. A gentle control arteriogram performed through this demonstrated no evidence of spasms, dissections or of intraluminal filling defects. Super selective arteriograms were then performed with the 027 microcatheter in the proximal superior division of the left middle cerebral artery, and the posterior branches of the inferior division of the left middle cerebral artery. These demonstrated multiple focal areas of caliber irregularity into the M4 M5 regions. With the microcatheter  in the anterior parietal branch of the inferior division of the left middle cerebral artery, the micro guidewire was then gently advanced in different directions in order to obtain anchorage and super select the suspected occluded branch in the M3 region. However, multiple attempts made were unsuccessful. The microcatheter, and the 5 [place] Catalyst guide catheter were retrieved and removed. A final control arteriogram performed through the balloon guide catheter in the left common carotid artery continued to demonstrate excellent flow in the left internal carotid artery proximally and distally with no change in the intracranial circulation. The balloon guide was removed as was the 8 [place] sheath. An 8 French Angio-Seal closure device was deployed for hemostasis at the right groin puncture site. Distal pulses remained Dopplerable in both feet unchanged at the end of the procedure. The patient was left intubated due to the patient's medical condition. He was then transferred to the heart ICU for further management. IMPRESSION: Status post endovascular superselective arteriograms of the left  middle cerebral artery branches with confirmation of moderate to moderately severe intracranial arteriosclerotic changes. A TICI 2C revascularization of the left MCA territory was maintained. 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 Multiple focal areas of moderate to moderately severe intracranial arteriosclerosis in the anterior and posterior circulations as described above. PLAN: Follow-up in the clinic 4 weeks post discharge. Electronically Signed   By: Luanne Bras M.D.   On: 06/09/2021 08:30    Labs:  Basic Metabolic Panel: Recent Labs  Lab 07/04/21 0816 07/05/21 0520  NA 138  --   K 4.0  --   CL 107  --   CO2 24  --   GLUCOSE 148*  --   BUN 19  --   CREATININE 1.35* 1.26*  CALCIUM 9.6  --     CBC: No results for input(s): WBC, NEUTROABS, HGB, HCT, MCV, PLT in the last 168 hours.   CBG: Recent Labs  Lab 06/30/21 2042  GLUCAP 103*   Family history.  Maternal grandmother with diabetes mother with hypertension Brother with hypertension.  Denies any colon cancer esophageal cancer or rectal cancer  Brief HPI:   Henry Simmons is a 51 y.o. right-handed male with history of CKD stage III left ACA infarction 2014 maintained on low-dose aspirin and Plavix CAD with stenting hypertrophic cardiomyopathy hypertension hyperlipidemia former tobacco use.  Per chart review lives with spouse independent prior to admission.  Presented 06/07/2021 with aphasia right-sided weakness and left gaze deviation of acute onset.  Cranial CT scan negative for acute changes.  Small left old peri colossal infarct small old infarct anterior left frontal lobe and old bilateral basal ganglia lacunar infarction.  Tiny likely old infarct right cerebellum.  He was found to have bradycardia in the 30s in the ED.  Echocardiogram with ejection fraction of 40 to [id number] grade 1 diastolic dysfunction with severe concentric left ventricular hypertrophy.  Admission chemistries unremarkable except creatinine 1.51  troponin [phone].  Patient underwent insertion of central venous catheter transvenous temporary pacemaker insertion 06/07/2021 per Dr. Doylene Canard for bradycardia as well as maintained on IV heparin.  Four-vessel cerebral arteriogram completed 06/07/2021 per interventional radiology showed extensive moderate to severe diffuse intracranial arteriosclerotic disease involving the middle cerebral arteries the anterior cerebral arteries in the posterior circulation.  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  Severe proximal basilar artery stenosis.  Approximately 50 to 70% stenosis of the left internal carotid artery supraclinoid segment.  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  No current plan for intervention.  Neurology follow-up maintained on low-dose aspirin and Plavix 75 mg daily.  Lovenox for DVT prophylaxis.  Cardiology continue to follow for heart block currently no plan for permanent pacemaker.  Therapy evaluations completed due to patient decreased functional mobility right side weakness was admitted for a comprehensive rehab program.   Hospital Course: Henry Simmons was admitted to rehab 06/14/2021 for inpatient therapies to consist of PT, ST and OT at least three hours five days a week. Past admission physiatrist, therapy team and rehab RN have worked together to provide customized collaborative inpatient rehab.  Pertaining to patient's left MCA and suspected brainstem infarct in the setting of extensive intracranial stenosis complete heart block with recent MI remained stable maintained on aspirin and Plavix therapy.  Lovenox for DVT prophylaxis no bleeding episodes.  In regards to patient's CAD/LAD stent non-STEMI he will continue on aspirin and Plavix till further notice.  Hypertrophic cardiomyopathy bradycardia initially with temporary pacemaker no  current plan for permanent pacemaker he remained on Jardiance.  Blood pressure controlled on present regimen will need outpatient follow-up.  CKD stage III with latest creatinine 1.29.  Dysphagia related to CVA currently on dysphagia #2 thin liquid diet.   Blood pressures were monitored on TID basis and soft and monitored    Rehab course: During patient's stay in rehab weekly team conferences were held to monitor patient's progress, set goals and discuss barriers to discharge. At admission, patient required moderate assist sit to stand max assist for rolling moderate assist supine to sit  Physical exam.  Blood pressure 108/83 pulse 80 temperature 98.3 respirations 14 oxygen saturation 98% room air Constitutional.  No acute distress HEENT Head.  Normocephalic and atraumatic Eyes.  Pupils round and reactive to light no discharge without nystagmus Neck.  Supple nontender no JVD without thyromegaly Cardiac regular rate rhythm any extra sounds or murmur heard Abdomen.  Soft nontender positive bowel sounds without rebound Respiratory effort normal no respiratory distress without wheeze Musculoskeletal Comments.  Left upper extremity 5/5 Left lower extremity 5/5 Right upper extremity biceps 2 -/5 otherwise 0/5 Right lower extremity 0/5 Neurologic.  Patient alert he was aphasic  He/She  has had improvement in activity tolerance, balance, postural control as well as ability to compensate for deficits. He/She has had improvement in functional use RUE/LUE  and RLE/LLE as well as improvement in awareness.  Supine to sit transfers moderate assist at trunk.  Lateral scoot across sliding board with minimal assist and moderate cues for sequencing.  Sit to stand x8 in parallel bars with minimal assist.  Dynamic gait training in parallel bars.  Patient demonstrating improved static sitting standing balance to complete upper body bathing and minimal assist level lower body bathing at moderate assist level.   Speech therapy continue to follow-up for patient's aphasia he would provide yes no head nods with some inconsistencies.  Patient could match object to word from a field of 3 with no more than minimal assist cues needed to recognize and correct errors.  Speech therapy follow-up for patient's dysphagia.  Full family teaching completed plan discharged to home       Disposition: Discharge to home   Diet: Dysphagia #2 thin liquids  Special Instructions: No driving smoking or alcohol  Medications at discharge. 1.  Tylenol as needed 2.  Norvasc 2.5 mg  p.o. daily 3.  Vitamin C 500 mg p.o. daily 4.  Aspirin 81 mg p.o. daily 5.  Lipitor 80 mg p.o. daily 6.  Plavix 75 mg p.o. daily 7.  Jardiance 10 mg p.o. daily 8.  Isordil 10 mg p.o. twice daily 9.  Lopressor 12.5 mg p.o. twice daily 10.  Nitroglycerin as needed chest pain 11.  Protonix 40 mg p.o. daily  30-35 minutes were spent completing discharge summary and discharge planning  Discharge Instructions     Ambulatory referral to Neurology   Complete by: As directed    An appointment is requested in approximately: 4 weeks left MCA infarction   Ambulatory referral to Physical Medicine Rehab   Complete by: As directed    Moderate complexity follow-up 1 to 2 weeks left MCA infarction        Follow-up Information     Kirsteins, Luanna Salk, MD Follow up.   Specialty: Physical Medicine and Rehabilitation Why: Office to call for appointment Contact information: [place] [phone]         Dixie Dials, MD Follow up.   Specialty: Cardiology Why: Call for appointment Contact information: [place] [address] [phone]                 Signed: Cathlyn Parsons 07/07/2021, 5:27 AM

## 2021-07-04 NOTE — Progress Notes (Signed)
Patient ID: Henry Simmons, male   DOB: [birth year], 51 y.o.   MRN: [id number]  Hospital Bed and Drop Arm Commode ordered through [employer]

## 2021-07-04 NOTE — Progress Notes (Signed)
Physical Therapy Session Note  Patient Details  Name: Henry Simmons MRN: [id number] Date of Birth: [birth year]  Today's Date: 07/04/2021 PT Individual Time: 3762-8315 PT Individual Time Calculation (min): 70 min   Short Term Goals: Week 2:  PT Short Term Goal 1 (Week 2): Pt will trasnfer to [facility] with min assist PT Short Term Goal 1 - Progress (Week 2): Revised due to lack of progress PT Short Term Goal 2 (Week 2): Pt will propell WC 179f with min assist PT Short Term Goal 2 - Progress (Week 2): Met PT Short Term Goal 3 (Week 2): Pt will ambulate 52fwith LRAD and max assist +2 consistently PT Short Term Goal 3 - Progress (Week 2): Met Week 3:  PT Short Term Goal 1 (Week 3): STG=LTG due to ELOS  Skilled Therapeutic Interventions/Progress Updates:  Patient seated upright in w/c on entrance to room. Patient alert and agreeable to PT session.   Patient with no pain complaint throughout session.  Therapeutic Activity: Bed Mobility: Patient performed sit-->supine with supervision for UB and Mod/ MaxA for RLE. VC/ tc required for bringing RLE to bed surface. Demonstrated ability to perform roll to R side with supervision and tc to initiate, then [place] roll to L side in order to assist with brief change after incontinent episode after return to supine. maxA for brief change. With positioning of RLE in hooklying, pt is able to perform clearance of hips from bed surface and good bridge performance with vc for desired performance.  Transfers: Patient performed sit<>stand transfers  with CGA for rise to stand and Mod/ MaxA for maintaining upright balance through mild push and melt to R side. Squat pivot requires continued education for desired movement and reminder not to stand up. Continues to demonstrate  rise to stand throughout session with ModA . Two squat pivot transfers to L side with MinA and good technique. One instance of pt performing lateral scoot transfer to mat table from w/c with CGA.   Provided verbal cues for safety in all techniques.  Wheelchair Mobility:  ATP present for w/c eval for specialty w/c prior to pt's d/c. Pt determined to need specialty height chair for 6'4" height. Family requesting lightweight w/c for ease of transport of pt to/ from appointments. Recommendation made for See LMN for full measurements and workup.   Patient propelled wheelchair 150 feet with ModA for direction and hemitechnique. Initially producing correct propulsion with LLE only. Addition of RUE increased push to R side and LLE unable to maintain straight path. After this, pt is unable to motor plan propulsion with directionality with LLE only. Continuous vc/ tc for technique throughout.   Patient supine  in bed at end of session with brakes locked, bed alarm set, and all needs within reach.   Therapy Documentation Precautions:  Precautions Precaution Comments: R hemiparesis, apraxic, global aphasia, diplopia Restrictions Weight Bearing Restrictions: No RLE Weight Bearing: Weight bearing as tolerated General:   Vital Signs:   Pain:  No indication of pain complaint this day.   Therapy/Group: Individual Therapy  [name], DPT 07/04/2021, 12:18 PM

## 2021-07-04 NOTE — Progress Notes (Signed)
PROGRESS NOTE   Subjective/Complaints:  Now has drink at bedside   ROS: Appears comfortable, limited due to communication deficit   Objective:   No results found. No results for input(s): WBC, HGB, HCT, PLT in the last 72 hours.  No results for input(s): NA, K, CL, CO2, GLUCOSE, BUN, CREATININE, CALCIUM in the last 72 hours.   Intake/Output Summary (Last 24 hours) at 07/04/2021 0808 Last data filed at 07/04/2021 0801 Gross per 24 hour  Intake 1320 ml  Output --  Net 1320 ml         Physical Exam: Vital Signs Blood pressure 116/82, pulse 83, temperature 98.2 F (36.8 C), temperature source Oral, resp. rate 18, height 6\' 2"  (1.88 m), weight 78.2 kg, SpO2 100 %.  General: No acute distress Mood and affect are appropriate Heart: Regular rate and rhythm no rubs murmurs or extra sounds Lungs: Clear to auscultation, breathing unlabored, no rales or wheezes Abdomen: Positive bowel sounds, soft nontender to palpation, nondistended Extremities: No clubbing, cyanosis, or edema Skin: No evidence of breakdown, no evidence of rash   Neurologic: globally aphasic. But able to close eyes to command , imitates gestures but perseverates, unable to perform body part ID  Cranial nerves II through XII intact, motor strength is 5/5 in left and 0/5 right deltoid, bicep, tricep, grip, hip flexor, knee extensors, ankle dorsiflexor and plantar flexor Sensory exam cannot assess due to aphasia   Musculoskeletal: No joint swelling .   Assessment/Plan: 1. Functional deficits which require 3+ hours per day of interdisciplinary therapy in a comprehensive inpatient rehab setting. Physiatrist is providing close team supervision and 24 hour management of active medical problems listed below. Physiatrist and rehab team continue to assess barriers to discharge/monitor patient progress toward functional and medical goals  Care Tool:  Bathing     Body parts bathed by patient: Chest, Abdomen, Right arm, Right upper leg, Left upper leg, Face   Body parts bathed by helper: Left arm, Right lower leg, Left lower leg, Buttocks, Front perineal area     Bathing assist Assist Level: Moderate Assistance - Patient 50 - 74%     Upper Body Dressing/Undressing Upper body dressing   What is the patient wearing?: Pull over shirt    Upper body assist Assist Level: Moderate Assistance - Patient 50 - 74%    Lower Body Dressing/Undressing Lower body dressing      What is the patient wearing?: Incontinence brief, Pants     Lower body assist Assist for lower body dressing: Maximal Assistance - Patient 25 - 49%     Toileting Toileting    Toileting assist Assist for toileting: Moderate Assistance - Patient 50 - 74%     Transfers Chair/bed transfer  Transfers assist     Chair/bed transfer assist level: Moderate Assistance - Patient 50 - 74%     Locomotion Ambulation   Ambulation assist   Ambulation activity did not occur: Safety/medical concerns          Walk 10 feet activity   Assist  Walk 10 feet activity did not occur: Safety/medical concerns        Walk 50 feet activity   Assist  Walk 50 feet with 2 turns activity did not occur: Safety/medical concerns         Walk 150 feet activity   Assist Walk 150 feet activity did not occur: Safety/medical concerns         Walk 10 feet on uneven surface  activity   Assist Walk 10 feet on uneven surfaces activity did not occur: Safety/medical concerns         Wheelchair     Assist Is the patient using a wheelchair?: Yes Type of Wheelchair: Manual    Wheelchair assist level: Maximal Assistance - Patient 25 - 49% Max wheelchair distance: 150    Wheelchair 50 feet with 2 turns activity    Assist        Assist Level: Maximal Assistance - Patient 25 - 49%   Wheelchair 150 feet activity     Assist      Assist Level: Maximal  Assistance - Patient 25 - 49%   Blood pressure 116/82, pulse 83, temperature 98.2 F (36.8 C), temperature source Oral, resp. rate 18, height 6\' 2"  (1.88 m), weight 78.2 kg, SpO2 100 %.  Medical Problem List and Plan: 1. 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  Impaired mobility: Continue Lovenox             -antiplatelet therapy: Aspirin 81 mg daily and Plavix 75 mg daily 3. Pain: N/A 4. Mood: Provide emotional support             -antipsychotic agents: N/A 5. Neuropsych: This patient is not capable of making decisions on his own behalf. 6. Skin/Wound Care: Routine skin checks 7. Fluids/Electrolytes/Nutrition: Routine in and outs with follow-up chemistries 8.  CAD/LAD stent/non-STEMI.  continue aspirin and Plavix.  Follow-up per cardiology services 9.  Hypertrophic cardiomyopathy/bradycardia.  Initially with temporary pacer.  No current plan for permanent pacemaker. continue Jardiance 10 mg daily. 10.  Hypertension.  continue Norvasc 2.5 mg daily, hydralazine 10 mg twice daily, Isordil 10 mg twice daily,Lopressor 12.5 mg BID, Cozaar 100 mg daily. Monitor with increased mobility Vitals:   07/03/21 1928 07/04/21 0353  BP: 125/85 116/82  Pulse: 81 83  Resp: 18 18  Temp: 98.1 F (36.7 C) 98.2 F (36.8 C)  SpO2: 100% 100%  Controlled 5/22  Cont Lopressor 12.5 mg BID, Isordil 10mg  BID, Norvasc 2.5 mg qd 11.  CKD stage III.  Follow-up chemistries-    Latest Ref Rng & Units 06/28/2021    5:47 AM 06/27/2021    7:51 AM 06/21/2021    5:48 AM  BMP  Glucose 70 - 99 mg/dL  115     BUN 6 - 20 mg/dL  26     Creatinine 0.61 -  1.24 mg/dL 1.40   1.29   1.34    Sodium 135 - 145 mmol/L  138     Potassium 3.5 - 5.1 mmol/L  4.4     Chloride 98 - 111 mmol/L  109     CO2 22 - 32 mmol/L  20  Calcium 8.9 - 10.3 mg/dL  9.6      BUN/Creat creeping up but GFR still normal , reducing Cozaar should help , enc po fluid  -if intake < 1036ml needs IV overnite - po intake improved  -Continue to encourage fluid intake -d/c cozaar - recheck BMET  5/22 12.  Hyperlipidemia.  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

## 2021-07-05 DIAGNOSIS — I63512 Cerebral infarction due to unspecified occlusion or stenosis of left middle cerebral artery: Secondary | ICD-10-CM | POA: Diagnosis not present

## 2021-07-05 LAB — CREATININE, SERUM
Creatinine, Ser: 1.26 mg/dL — ABNORMAL HIGH (ref 0.61–1.24)
GFR, Estimated: >60 mL/min (ref >60)

## 2021-07-05 NOTE — Progress Notes (Signed)
Speech Language Pathology Daily Session Note  Patient Details  Name: Henry Simmons MRN: [id number] Date of Birth: [birth year]  Today's Date: 07/05/2021 SLP Individual Time: 1430-1500 SLP Individual Time Calculation (min): 30 min  Short Term Goals: Week 3: SLP Short Term Goal 1 (Week 3): Pt will answer basic yes/no questions with 60% accuracy via multimodal means with Max A. SLP Short Term Goal 2 (Week 3): Pt will follow one-step commands within context of functional or therapeutic task on 50% of trials given Mod multimodal A. SLP Short Term Goal 3 (Week 3): Pt will complete receptive ID of common + functional words with accompanying picture with 80% accuracy when word choices are read aloud. SLP Short Term Goal 4 (Week 3): Pt will utilize multimodal communication methods to communicate wants, needs, thoughts, and ideas x 8 with written choice prompts. SLP Short Term Goal 5 (Week 3): Pt will consume trials of ice chips with functional mastication and no s/sx concerning for aspiration to improve oral, motor planning for mastcation on 5 trials with Min A.  Skilled Therapeutic Interventions: Skilled ST treatment focused family education with spouse. SLP provided verbal and written education re: aphasia, apraxia, low-tech AAC supports, communication strategies, motor planning deficits, and dysphagia 2 diet recommendations. SLP provided demonstration for use of strategies to support communication exchange involving yes/no questions, written field of choices, AAC supports, and gestures. Provided spouse with handouts and additional low tech communication supports. Spouse verbalized and demonstrated understanding of all discussed topics. Stated she has purchased a dry erase board for use at home. Will continue education during tomorrow's session. Patient was left in bed with alarm activated and immediate needs within reach at end of session. Continue per current plan of care.      Pain Pain Assessment Pain  Scale: 0-10 Pain Score: 0-No pain  Therapy/Group: Individual Therapy  Trevious Rampey T Yousif Edelson 07/05/2021, 4:20 PM

## 2021-07-05 NOTE — Progress Notes (Signed)
PROGRESS NOTE   Subjective/Complaints:  Remains severely aphasic  ROS: Appears comfortable, limited due to communication deficit   Objective:   No results found. No results for input(s): WBC, HGB, HCT, PLT in the last 72 hours.  Recent Labs    07/04/21 0816 07/05/21 0520  NA 138  --   K 4.0  --   CL 107  --   CO2 24  --   GLUCOSE 148*  --   BUN 19  --   CREATININE 1.35* 1.26*  CALCIUM 9.6  --      Intake/Output Summary (Last 24 hours) at 07/05/2021 0827 Last data filed at 07/05/2021 0700 Gross per 24 hour  Intake 958 ml  Output --  Net 958 ml         Physical Exam: Vital Signs Blood pressure 131/81, pulse 75, temperature 98.6 F (37 C), temperature source Oral, resp. rate 18, height 6\' 2"  (1.88 m), weight 77.6 kg, SpO2 100 %.  General: No acute distress Mood and affect are appropriate Heart: Regular rate and rhythm no rubs murmurs or extra sounds Lungs: Clear to auscultation, breathing unlabored, no rales or wheezes Abdomen: Positive bowel sounds, soft nontender to palpation, nondistended Extremities: No clubbing, cyanosis, or edema Skin: No evidence of breakdown, no evidence of rash   Neurologic: globally aphasic. But able to close eyes to command , imitates gestures but perseverates, unable to perform body part ID  Cranial nerves II through XII intact, motor strength is 5/5 in left and 0/5 right deltoid, bicep, tricep, grip, hip flexor, knee extensors, ankle dorsiflexor and plantar flexor Sensory exam cannot assess due to aphasia   Musculoskeletal: No joint swelling .   Assessment/Plan: 1. Functional deficits which require 3+ hours per day of interdisciplinary therapy in a comprehensive inpatient rehab setting. Physiatrist is providing close team supervision and 24 hour management of active medical problems listed below. Physiatrist and rehab team continue to assess barriers to discharge/monitor  patient progress toward functional and medical goals  Care Tool:  Bathing    Body parts bathed by patient: Chest, Abdomen, Right arm, Right upper leg, Left upper leg, Face   Body parts bathed by helper: Left arm, Right lower leg, Left lower leg, Buttocks, Front perineal area     Bathing assist Assist Level: Moderate Assistance - Patient 50 - 74%     Upper Body Dressing/Undressing Upper body dressing   What is the patient wearing?: Pull over shirt    Upper body assist Assist Level: Moderate Assistance - Patient 50 - 74%    Lower Body Dressing/Undressing Lower body dressing      What is the patient wearing?: Incontinence brief, Pants     Lower body assist Assist for lower body dressing: Maximal Assistance - Patient 25 - 49%     Toileting Toileting    Toileting assist Assist for toileting: Moderate Assistance - Patient 50 - 74%     Transfers Chair/bed transfer  Transfers assist     Chair/bed transfer assist level: Moderate Assistance - Patient 50 - 74%     Locomotion Ambulation   Ambulation assist   Ambulation activity did not occur: Safety/medical concerns  Walk 10 feet activity   Assist  Walk 10 feet activity did not occur: Safety/medical concerns        Walk 50 feet activity   Assist Walk 50 feet with 2 turns activity did not occur: Safety/medical concerns         Walk 150 feet activity   Assist Walk 150 feet activity did not occur: Safety/medical concerns         Walk 10 feet on uneven surface  activity   Assist Walk 10 feet on uneven surfaces activity did not occur: Safety/medical concerns         Wheelchair     Assist Is the patient using a wheelchair?: Yes Type of Wheelchair: Manual    Wheelchair assist level: Maximal Assistance - Patient 25 - 49% Max wheelchair distance: 150    Wheelchair 50 feet with 2 turns activity    Assist        Assist Level: Maximal Assistance - Patient 25 - 49%    Wheelchair 150 feet activity     Assist      Assist Level: Maximal Assistance - Patient 25 - 49%   Blood pressure 131/81, pulse 75, temperature 98.6 F (37 C), temperature source Oral, resp. rate 18, height 6\' 2"  (1.88 m), weight 77.6 kg, SpO2 100 %.  Medical Problem List and Plan: 1. 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  Impaired mobility: Continue Lovenox             -antiplatelet therapy: Aspirin 81 mg daily and Plavix 75 mg daily 3. Pain: N/A 4. Mood: Provide emotional support             -antipsychotic agents: N/A 5. Neuropsych: This patient is not capable of making decisions on his own behalf. 6. Skin/Wound Care: Routine skin checks 7. Fluids/Electrolytes/Nutrition: Routine in and outs with follow-up chemistries 8.  CAD/LAD stent/non-STEMI.  continue aspirin and Plavix.  Follow-up per cardiology services 9.  Hypertrophic cardiomyopathy/bradycardia.  Initially with temporary pacer.  No current plan for permanent pacemaker. continue Jardiance 10 mg daily. 10.  Hypertension.  continue Norvasc 2.5 mg daily, hydralazine 10 mg twice daily, Isordil 10 mg twice daily,Lopressor 12.5 mg BID, Cozaar 100 mg daily. Monitor with increased mobility Vitals:   07/04/21 1946 07/05/21 0407  BP: 117/78 131/81  Pulse: 80 75  Resp: 18 18  Temp: 98.4 F (36.9 C) 98.6 F (37 C)  SpO2: 99% 100%  Controlled 5/22  Cont Lopressor 12.5 mg BID, Isordil 10mg  BID, Norvasc 2.5 mg qd 11.  CKD stage III.  Follow-up chemistries-    Latest Ref Rng & Units 07/05/2021    5:20 AM 07/04/2021    8:16 AM 06/28/2021    5:47 AM  BMP   Glucose 70 - 99 mg/dL  148     BUN 6 - 20 mg/dL  19     Creatinine 0.61 - 1.24 mg/dL 1.26   1.35   1.40    Sodium 135 - 145 mmol/L  138  Potassium 3.5 - 5.1 mmol/L  4.0     Chloride 98 - 111 mmol/L  107     CO2 22 - 32 mmol/L  24     Calcium 8.9 - 10.3 mg/dL  9.6      BUN/Creat creeping up but GFR still normal , reducing Cozaar should help , enc po fluid  -if intake < 1065ml needs IV overnite - po intake improved > 1049ml x 2d -Continue to encourage fluid intake -d/c cozaar - recheck BMET  improved  12.  Hyperlipidemia.  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

## 2021-07-05 NOTE — Progress Notes (Signed)
Patient ID: Henry Simmons, male   DOB: 1970/09/19, 51 y.o.   MRN: [id number]  Sw spoke with patient mother. Mother expressing concerns of the patient not being ready for discharge. Sw informed mother as of now the patient is set for discharge but team will discuss tomorrow during conference. Mother requesting an update from physician.

## 2021-07-05 NOTE — Progress Notes (Signed)
Physical Therapy Session Note  Patient Details  Name: Henry Simmons MRN: [id number] Date of Birth: 12/14/1970  Today's Date: 07/05/2021 PT Individual Time: 4799-8721 PT Individual Time Calculation (min): 58 min   Short Term Goals: Week 2:  PT Short Term Goal 1 (Week 2): Pt will trasnfer to [facility] with min assist PT Short Term Goal 1 - Progress (Week 2): Revised due to lack of progress PT Short Term Goal 2 (Week 2): Pt will propell WC 119f with min assist PT Short Term Goal 2 - Progress (Week 2): Met PT Short Term Goal 3 (Week 2): Pt will ambulate 526fwith LRAD and max assist +2 consistently PT Short Term Goal 3 - Progress (Week 2): Met Week 3:  PT Short Term Goal 1 (Week 3): STG=LTG due to ELOS Week 4:     Skilled Therapeutic Interventions/Progress Updates:      Therapy Documentation Precautions:  Precautions Precaution Comments: R hemiparesis, apraxic, global aphasia, diplopia Restrictions Weight Bearing Restrictions: No RLE Weight Bearing: Weight bearing as tolerated General:   Vital Signs:   Pain: Pain Assessment Pain Score: 0-No pain Mobility:   Locomotion :    Trunk/Postural Assessment :    Balance:   Exercises:   Other Treatments:      Therapy/Group: Individual Therapy  [name], DPT 07/05/2021, 7:00 PM

## 2021-07-05 NOTE — Progress Notes (Signed)
Occupational Therapy Session Note  Patient Details  Name: Henry Simmons MRN: [id number] Date of Birth: Jun 13, 1970  Today's Date: 07/05/2021 OT Individual Time: 1005-1030 OT Individual Time Calculation (min): 25 min + 61 min   Short Term Goals: Week 1:  OT Short Term Goal 1 (Week 1): Pt will complete ADL task EOB with no more than min A for balance. OT Short Term Goal 1 - Progress (Week 1): Not met OT Short Term Goal 2 (Week 1): Pt will don shirt in supported sitting with min A. OT Short Term Goal 2 - Progress (Week 1): Met OT Short Term Goal 3 (Week 1): Pt will complete grooming task with no more than min A. OT Short Term Goal 3 - Progress (Week 1): Met OT Short Term Goal 4 (Week 1): Pt will complete toilet transfer with max A and LRAD. OT Short Term Goal 4 - Progress (Week 1): Not met Week 2:  OT Short Term Goal 1 (Week 2): Pt will complete toilet transfer with max A and LRAD. OT Short Term Goal 1 - Progress (Week 2): Not met OT Short Term Goal 2 (Week 2): Pt will complete ADL task EOB with no more than min A for balance. OT Short Term Goal 2 - Progress (Week 2): Not met OT Short Term Goal 3 (Week 2): Pt will complete standing grooming task at sink with mod A of 2. OT Short Term Goal 3 - Progress (Week 2): Not met OT Short Term Goal 4 (Week 2): Pt will don pants with max A. OT Short Term Goal 4 - Progress (Week 2): Met Week 3:  OT Short Term Goal 1 (Week 3): STG = LTG 2/2 ELOS  Skilled Therapeutic Interventions/Progress Updates:    Session 1 ([id number]): Pt received semi-reclined in bed with RN present, no s/sx pain, agreeable to therapy. Session focus on self-care retraining, activity tolerance, transfer retraining in prep for improved ADL/IADL/func mobility performance + decreased caregiver burden. Donned pants bed level with mod A to thread RLE and to pull over R hip, much improved perforance donning pants at bed level vs EOB. Came to sitting EOB with min A to progress RLE off bed.  Intermittent min to mod A to recover from R LOB when engaged in ADL. Doffed shirt mod A to remove RUE/pull over head, applied deodorant with min A to support RUE, pt initially applying to his neck. Total A to don socks/teds/R AFO, but pt able to adjust heels and assist pulling up TEDS. Total A to don R wrist cock up splint.   SB transfer to his L with overall min A for balance/initiation, cues to stay on board. Total A to place/stabilize board. Completed oral care at sink set-up A and with S as pt expelling water over lap.  Pt left seated in w/c with safety belt alarm engaged, call bell in reach, and all immediate needs met.    Session 2 [phone]): Pt received in stedy on toilet with NT and wife present, agreeable to therapy. Session focus on self-care retraining, activity tolerance, family education, transfer retraining in prep for improved ADL/IADL/func mobility performance + decreased caregiver burden. Wife present for scheduled family education.  Massed demonstration and hands-on practice with wife of w/c <> bed <> drop arm 3in1 transfers via squat-pivot and SB. Pt demonstrates most consistent and safe performance with use of SB and brightly colored dot for LUE placement during transfer. Required min going to his L and mod A going to his R  during best performance, assist to manage RLE throughout and cues to stay forward/ vs standing up motor plan. Pt appears to initiate standing up more often during squat-pivot than vs solely with board, wife endorses that she feels more comfortable with the board.  Simulated toileting/LB clothing management, pt able to complete sit to stand with LUE on bed rail and min A to block R knee.   Demonstrated PROM to RUE elbow/wrist/digits/ pt able to assist with cues, as well. Reviewed application of daytime/night time RUE splint.   Wife verbalized understanding of pt current deficits and impact on safe BADL/mobility performance. No additional questions at this time.  Wife reports that she plans on being the primary CG, pt's mom will provide S only during her 2 hour am shift and later 3 hr pm shift. Issued printed CVA support group flyer, PROM HEP, and RUE HEMI positioning sheets.  Pt left semi-reclined in bed, pass off to SLP with wife present, bed alarm engaged, call bell in reach, and all immediate needs met.    Therapy Documentation Precautions:  Precautions Precaution Comments: R hemiparesis, apraxic, global aphasia, diplopia Restrictions Weight Bearing Restrictions: No RLE Weight Bearing: Weight bearing as tolerated  Pain: no s/sx throughout   ADL: See Care Tool for more details.  Therapy/Group: Individual Therapy  Volanda Napoleon MS, OTR/L  07/05/2021, 6:55 AM

## 2021-07-06 DIAGNOSIS — I63512 Cerebral infarction due to unspecified occlusion or stenosis of left middle cerebral artery: Secondary | ICD-10-CM | POA: Diagnosis not present

## 2021-07-06 MED ORDER — VITAMIN C 500 MG PO TABS: 500.0000 mg | ORAL_TABLET | Freq: Every day | ORAL | 0 refills | Status: DC

## 2021-07-06 MED ORDER — CLOPIDOGREL BISULFATE 75 MG PO TABS: 75.0000 mg | ORAL_TABLET | Freq: Every day | ORAL | 0 refills | Status: DC

## 2021-07-06 MED ORDER — ATORVASTATIN CALCIUM 80 MG PO TABS: 80.0000 mg | ORAL_TABLET | Freq: Every day | ORAL | 3 refills | Status: DC

## 2021-07-06 MED ORDER — EMPAGLIFLOZIN 10 MG PO TABS: 10.0000 mg | ORAL_TABLET | Freq: Every day | ORAL | 11 refills | Status: DC

## 2021-07-06 MED ORDER — NITROGLYCERIN 0.4 MG SL SUBL: 0.4000 mg | SUBLINGUAL_TABLET | SUBLINGUAL | 1 refills | Status: DC | PRN

## 2021-07-06 MED ORDER — ISOSORBIDE DINITRATE 10 MG PO TABS: 10.0000 mg | ORAL_TABLET | Freq: Two times a day (BID) | ORAL | 3 refills | Status: DC

## 2021-07-06 MED ORDER — PANTOPRAZOLE SODIUM 40 MG PO TBEC: 40.0000 mg | DELAYED_RELEASE_TABLET | Freq: Every day | ORAL | 3 refills | Status: DC

## 2021-07-06 MED ORDER — METOPROLOL TARTRATE 25 MG PO TABS: 12.5000 mg | ORAL_TABLET | Freq: Two times a day (BID) | ORAL | 3 refills | Status: DC

## 2021-07-06 NOTE — Progress Notes (Signed)
Patient ID: Henry Simmons, male   DOB: 1971/01/31, 51 y.o.   MRN: [id number] Team Conference Report to Patient/Family  Team Conference discussion was reviewed with the patient and caregiver, including goals, any changes in plan of care and target discharge date.  Patient and caregiver express understanding and are in agreement.  The patient has a target discharge date of 07/07/21.  Sw spoke to patient spouse and mother and informed them of the potential extension offered if willing to complete additional family education. Patient spouse reports that she does not feel as she needs the additional education and she feels that she cam handle everything that she was already educated on. Patient spouse prefers that patient still discharge home tomorrow with follow up [facility] therapies. No additional questions or concerns.Spouse informed about the discharge timeframe of 9-11 AM.  Dyanne Iha 07/06/2021, 2:23 PM

## 2021-07-06 NOTE — Plan of Care (Signed)
  Problem: RH Ambulation Goal: LTG Patient will ambulate in home environment (PT) Description: LTG: Patient will ambulate in home environment, # of feet with assistance (PT). Outcome: Not Applicable Note: Pt not functionally ambulating at d/c    Problem: RH Bed Mobility Goal: LTG Patient will perform bed mobility with assist (PT) Description: LTG: Patient will perform bed mobility with assistance, with/without cues (PT). Flowsheets (Taken 07/06/2021 1102) LTG: Pt will perform bed mobility with assistance level of: Minimal Assistance - Patient > 75%   Problem: RH Bed to Chair Transfers Goal: LTG Patient will perform bed/chair transfers w/assist (PT) Description: LTG: Patient will perform bed to chair transfers with assistance (PT). Flowsheets (Taken 07/06/2021 1102) LTG: Pt will perform Bed to Chair Transfers with assistance level: Moderate Assistance - Patient 50 - 74%   Problem: RH Car Transfers Goal: LTG Patient will perform car transfers with assist (PT) Description: LTG: Patient will perform car transfers with assistance (PT). Flowsheets (Taken 07/06/2021 1102) LTG: Pt will perform car transfers with assist:: Moderate Assistance - Patient 50 - 74%   Problem: RH Ambulation Goal: LTG Patient will ambulate in controlled environment (PT) Description: LTG: Patient will ambulate in a controlled environment, # of feet with assistance (PT). Flowsheets (Taken 07/06/2021 1102) LTG: Pt will ambulate in controlled environ  assist needed:: Maximal Assistance - Patient 25 - 49% LTG: Ambulation distance in controlled environment: 65ft with LRAD with PT only

## 2021-07-06 NOTE — Progress Notes (Signed)
Physical Therapy Discharge Summary  Patient Details  Name: Henry Simmons MRN: [id number] Date of Birth: 11/22/1970  Today's Date: 07/06/2021 PT Individual Time: 1430-1525 PT Individual Time Calculation (min): 55 min    Patient has met {NUMBERS 0-12:18577} of {NUMBERS 0-12:18577} long term goals due to {due NT:6144315}.  Patient to discharge at [facility] level {LOA:3049010}.   Patient's care partner {care partner:3041650} to provide the necessary {assistance:3041652} assistance at discharge.  Reasons goals not met: ***  Recommendation:  Patient will benefit from ongoing skilled PT services in {setting:3041680} to continue to advance safe functional mobility, address ongoing impairments in ***, and minimize fall risk.  Equipment: {equipment:3041657}  Reasons for discharge: {Reason for discharge:3049018}  Patient/family agrees with progress made and goals achieved: {Pt/Family agree with progress/goals:3049020}  PT Discharge Precautions/Restrictions Precautions Precautions: Fall;Other (comment) Precaution Comments: R hemiparesis, apraxic, global aphasia, diplopia Restrictions Weight Bearing Restrictions: No Vital Signs Therapy Vitals Temp: 98.5 F (36.9 C) Pulse Rate: 85 Resp: 15 BP: 125/88 Patient Position (if appropriate): Sitting Oxygen Therapy O2 Device: Room Air Pain   Faces: none.  Pain Interference Pain Interference Pain Effect on Sleep: 8. Unable to answer Pain Interference with Therapy Activities: 8. Unable to answer Pain Interference with Day-to-Day Activities: 8. Unable to answer Vision/Perception  Vision - History Ability to See in Adequate Light: 1 Impaired (appears to endorse that he is still experiencing diplopia, R visual field deficit) Vision - Assessment Eye Alignment: Within Functional Limits Alignment/Gaze Preference: Within Defined Limits Tracking/Visual Pursuits: Requires cues, head turns, or add eye shifts to track Saccades: Additional  head turns occurred during testing;Decreased speed of saccadic movement Convergence: Impaired (comment) Diplopia Assessment: Disappears with one eye closed Additional Comments: Difficulty fully assessing due to expressive/receptive language deficits. Perception Perception: Impaired Inattention/Neglect: Does not attend to right side of body Praxis Praxis: Impaired Praxis Impairment Details: Ideomotor;Motor planning;Perseveration Praxis-Other Comments: Continues to perseverate washing face during bathing, applying deodorant inappropriately  Cognition Overall Cognitive Status: Impaired/Different from baseline Arousal/Alertness: Awake/alert Orientation Level: Oriented to person Year: 2020 (Pt pointed to "2020" given field of 3 written choices. Difficult to assess accuracy 2/2 aphasia) Month: July (Pt pointed to "July" given field of 3 written choices. Difficult to assess accuracy 2/2 aphasia) Day of Week: Incorrect (Pt pointed to incorrect day of week when given field of 3 written choices. Difficult to assess accuracy 2/2 aphasia) Attention: Sustained Sustained Attention: Appears intact Memory: Impaired (Difficulty fully assessing due to expressive/receptive language deficits.) Awareness: Impaired Awareness Impairment: Intellectual impairment Problem Solving:  (Difficulty fully assessing due to expressive/receptive language deficits.) Behaviors: Perseveration;Impulsive Safety/Judgment: Impaired Comments: Pt pointed to correct year/day of week in written field of 2, incorrect month Sensation Sensation Light Touch: Impaired Detail Light Touch Impaired Details: Impaired RUE;Impaired RLE Hot/Cold: Not tested Proprioception: Impaired by gross assessment Stereognosis: Impaired by gross assessment Additional Comments: UTA 2/2 global aphasia, does nod head when depe touch/LT applied to RUE, R hemiparesis Coordination Gross Motor Movements are Fluid and Coordinated: No Fine Motor Movements  are Fluid and Coordinated: No Coordination and Movement Description: dense R hemiplegia Finger Nose Finger Test: unable to complete on R or following commands to attempt on his R Motor  Motor Motor: Hemiplegia Motor - Discharge Observations: dense R hemiplegia  Mobility Bed Mobility Bed Mobility: Supine to Sit;Sit to Supine Supine to Sit: Minimal Assistance - Patient > 75% Sit to Supine: Minimal Assistance - Patient > 75% Transfers Transfers: Sit to Stand;Stand to Sit;Transfer;Squat Pivot Transfers Sit to Stand: Minimal Assistance - Patient >  75%;Moderate Assistance - Patient 50-74% Stand to Sit: Minimal Assistance - Patient > 75%;Moderate Assistance - Patient 50-74% Squat Pivot Transfers: Minimal Assistance - Patient > 75%;Moderate Assistance - Patient 50-74% Lateral/Scoot Transfers: Minimal Assistance - Patient > 75%;Moderate Assistance - Patient 50-74% Transfer (Assistive device): None Locomotion  Pick up small object from the floor assist level: Maximal Assistance - Patient 25 - 49%  Trunk/Postural Assessment  Cervical Assessment Cervical Assessment: Within Functional Limits Thoracic Assessment Thoracic Assessment: Within Functional Limits Lumbar Assessment Lumbar Assessment: Within Functional Limits Postural Control Postural Control: Deficits on evaluation (LOB posteriorally and > R when engaged in ADL EOB)  Balance Balance Balance Assessed: Yes Static Sitting Balance Static Sitting - Level of Assistance: 5: Stand by assistance Dynamic Sitting Balance Dynamic Sitting - Level of Assistance: 4: Min assist Static Standing Balance Static Standing - Balance Support: Left upper extremity supported Static Standing - Level of Assistance: 4: Min assist;3: Mod assist Extremity Assessment  RUE Assessment RUE Assessment: Exceptions to [facility] General Strength Comments: tone developing in R biceps, digit flexors RUE Body System: Neuro Brunstrum levels for arm and hand:  Arm;Hand Brunstrum level for arm: Stage I Presynergy Brunstrum level for hand: Stage I Flaccidity LUE Assessment LUE Assessment: Within Functional Limits General Strength Comments: grossly WFL but apraxic at times        Lorie Phenix 07/06/2021, 5:23 PM

## 2021-07-06 NOTE — Progress Notes (Signed)
Occupational Therapy Discharge Summary  Patient Details  Name: Henry Simmons MRN: [id number] Date of Birth: December 12, 1970  Today's Date: 07/06/2021 OT Individual Time: 8115-7262 OT Individual Time Calculation (min): 55 min + 55 min   Patient has met 11 of 11 long term goals due to improved activity tolerance, improved balance, postural control, ability to compensate for deficits, improved attention, improved awareness, and improved coordination.  Patient to discharge at overall Mod Assist level.  Patient's care partner requires assistance to provide the necessary physical and cognitive assistance at discharge.  Mother plans to provide S to pt during wife's work shifts. Pt to DC at the below ADL/bathroom transfer performance level. Assist levels represent pt's most consistent performance when alert/participatory. Pt continues to be primarily limited by dense R hemiplegia, R inattention, global aphasia, and increasing tone in RUE. Family education  completed with wife/mom/father with caregivers demonstrating fair understanding of pt's current impairments and impact on BADL/mobility needs. Pt and caregivers will benefit from continued [facility] OT to facilitate improved caregiver education, functional mobility, and occupational performance.   Reasons goals not met: NA, goals ultimately downgraded to mod to max A overall for BADL and min to mod A for droparm BSC transfers due to slow functional progress due to severity of deficits/slower than anticipated progress.  Recommendation:  Patient will benefit from ongoing skilled OT services in home health setting to continue to advance functional skills in the area of BADL and Reduce care partner burden.  Equipment: Droparm 3in1  Reasons for discharge: treatment goals met and discharge from hospital  Patient/family agrees with progress made and goals achieved: Yes  OT Discharge Precautions/Restrictions  Precautions Precautions: Fall;Other  (comment) Precaution Comments: R hemiparesis, apraxic, global aphasia, diplopia Restrictions Weight Bearing Restrictions: No  Pain   No s/sx throughout session ADL ADL Eating: Supervision/safety Where Assessed-Eating: Wheelchair Grooming: Supervision/safety Where Assessed-Grooming: Sitting at sink Upper Body Bathing: Minimal assistance Where Assessed-Upper Body Bathing: Edge of bed Lower Body Bathing: Moderate assistance Where Assessed-Lower Body Bathing: Edge of bed Upper Body Dressing: Moderate assistance Where Assessed-Upper Body Dressing: Edge of bed Lower Body Dressing: Moderate assistance Where Assessed-Lower Body Dressing: Bed level Toileting: Maximal assistance Where Assessed-Toileting: Bedside Commode Toilet Transfer: Moderate assistance, Minimal assistance Toilet Transfer Method: Theatre manager: Radiographer, therapeutic: Not assessed Social research officer, government: Not assessed Vision Baseline Vision/History:  (unable to state, no personal glasses in room) Patient Visual Report: Diplopia Vision Assessment?: Yes Eye Alignment: Within Functional Limits Ocular Range of Motion: (P) Other (comment) (moves head during visual tracking, but able to track stimulus in all quads) Alignment/Gaze Preference: Within Defined Limits Tracking/Visual Pursuits: Requires cues, head turns, or add eye shifts to track Saccades: Additional head turns occurred during testing;Decreased speed of saccadic movement Convergence: Impaired (comment) Visual Fields: Right visual field deficit Diplopia Assessment: Disappears with one eye closed Additional Comments: Difficulty fully assessing due to expressive/receptive language deficits. Perception  Perception: Impaired Inattention/Neglect: Does not attend to right side of body Praxis Praxis: Impaired Praxis Impairment Details: Ideomotor;Motor planning;Perseveration Praxis-Other Comments: Continues to perseverate  washing face during bathing, applying deodorant inappropriately Cognition Cognition Overall Cognitive Status: Impaired/Different from baseline Arousal/Alertness: Awake/alert Orientation Level: Nonverbal/unable to assess Memory: Impaired (Difficulty fully assessing due to expressive/receptive language deficits.) Attention: Sustained Sustained Attention: Appears intact Awareness: Impaired Awareness Impairment: Intellectual impairment Problem Solving:  (Difficulty fully assessing due to expressive/receptive language deficits.) Behaviors: Perseveration;Impulsive Safety/Judgment: Impaired Comments: Pt pointed to correct year/day of week in written field of 2, incorrect  month Brief Interview for Mental Status (BIMS) Repetition of Three Words (First Attempt): No answer Temporal Orientation: Year: No answer Temporal Orientation: Month: No answer Temporal Orientation: Day: No answer Recall: "Sock": No answer Recall: "Blue": No answer Recall: "Bed": No answer BIMS Summary Score: 99 Sensation Sensation Light Touch: Impaired Detail Light Touch Impaired Details: Impaired RUE;Impaired RLE Hot/Cold: Not tested Proprioception: Impaired by gross assessment Stereognosis: Impaired by gross assessment Additional Comments: UTA 2/2 global aphasia, does nod head when depe touch/LT applied to RUE, R hemiparesis Coordination Gross Motor Movements are Fluid and Coordinated: No Fine Motor Movements are Fluid and Coordinated: No Coordination and Movement Description: dense R hemiplegia Finger Nose Finger Test: unable to complete on R or following commands to attempt on his R Motor  Motor Motor: Hemiplegia Motor - Discharge Observations: dense R hemiplegia Mobility  Bed Mobility Bed Mobility: Supine to Sit;Sit to Supine Supine to Sit: Minimal Assistance - Patient > 75% Sit to Supine: Minimal Assistance - Patient > 75% Transfers Sit to Stand: Minimal Assistance - Patient > 75%;Moderate Assistance -  Patient 50-74% Stand to Sit: Minimal Assistance - Patient > 75%;Moderate Assistance - Patient 50-74%  Trunk/Postural Assessment  Cervical Assessment Cervical Assessment: Within Functional Limits Thoracic Assessment Thoracic Assessment: Within Functional Limits Lumbar Assessment Lumbar Assessment: Within Functional Limits Postural Control Postural Control: Deficits on evaluation (LOB posteriorally and > R when engaged in ADL EOB)  Balance Balance Balance Assessed: Yes Static Sitting Balance Static Sitting - Level of Assistance: 5: Stand by assistance Dynamic Sitting Balance Dynamic Sitting - Level of Assistance: 4: Min assist Static Standing Balance Static Standing - Balance Support: Left upper extremity supported Static Standing - Level of Assistance: 4: Min assist;3: Mod assist Extremity/Trunk Assessment RUE Assessment RUE Assessment: Exceptions to [facility] General Strength Comments: tone developing in R biceps, digit flexors RUE Body System: Neuro Brunstrum levels for arm and hand: Arm;Hand Brunstrum level for arm: Stage I Presynergy Brunstrum level for hand: Stage I Flaccidity LUE Assessment LUE Assessment: Within Functional Limits General Strength Comments: grossly WFL but apraxic at times   Session Note (1135-1200): Pt received seated in new loaner chair, no s/sx pain, appears agreeable to therapy.  Self-propelled w/c via hemi technique to and from Day Room with overall Close S to min A to place hand on rim and become unstuck from R sided obstacles. Completed DC reassessments as documented above. Pt required written field of 2 to answer questions, although inconsistently. Continue to not note any activation in RUE, assessed at Brummstrom level I and modified Ashworth assessed at 1+.  Pt left seated in w/c with safety belt alarm engaged, call bell in reach, and all immediate needs met.    Session Note [phone]): Pt received seated in w/c with parents and then later wife  present, no s/sx pain, agreeable to therapy. Session focus on self-care retraining, activity tolerance, transfer retraining, family education in prep for improved ADL/IADL/func mobility performance + decreased caregiver burden.  Mother/father endorse that mother plans to sit with pt while wife is working and provide S assist only. Did educate on w/c part management and demonstrated SB transfer to and from w/c with use of colored dot for visual cue to push through RUE. Pt completing transfers with overall min A for balance/to progress RLE + total A to place and stabilize board. Self-propelled w/c to and from nurses station with same assist documented in earlier session.  Wife then present and reports no additional concerns re DC and declined additional hands  on practice, reports feeling comfortable completing pt BADL. Did share concerns on "confusing" pt by practicing SB transfers to his L/R between OT/PT, feels that he should be transferring only to his plegic side. Reports plan to complete stand-pivots at home to force use of RLE as she has concerns he hasn't been working on RLE sufficiently in therapy. Extensive education given on team's decision to rec SB use upon DC for pt/CG safety and in order to promote consistency for motor planning due to pt's receptive language, motor planning deficits and tendency to come into standing unpredictably /turning on extensor muscles. Reviewed option to call EMS to assist pt if fall were to occur, wife states "he won't fall." Will continue to benefit from additional education to promote CG/pt safety.  RN in/out pm medication administration.  Pt left seated in w/c with safety belt alarm engaged, call bell in reach, and all immediate needs met.     Volanda Napoleon MS, OTR/L   07/06/2021, 3:35 PM

## 2021-07-06 NOTE — Progress Notes (Signed)
Physical Therapy Session Note  Patient Details  Name: Henry Simmons MRN: [id number] Date of Birth: [birth year]  Today's Date: 07/06/2021 PT Individual Time: 1032-1100 PT Individual Time Calculation (min): 28 min   Short Term Goals: Week 1:  PT Short Term Goal 1 (Week 1): pt will transfer to [facility] with mod assist PT Short Term Goal 1 - Progress (Week 1): Met PT Short Term Goal 2 (Week 1): Pt will ambulate 53f with max assist and LRAD PT Short Term Goal 2 - Progress (Week 1): Met PT Short Term Goal 3 (Week 1): Pt pt will propell WC 1030fwith min assist PT Short Term Goal 3 - Progress (Week 1): Met Week 2:  PT Short Term Goal 1 (Week 2): [facility] PT Short Term Goal 1 - Progress (Week 2): Revised due to lack of progress PT Short Term Goal 2 (Week 2): Pt will propell WC 15036fith min assist PT Short Term Goal 2 - Progress (Week 2): Met PT Short Term Goal 3 (Week 2): Pt will ambulate 76f26fth LRAD and max assist +2 consistently PT Short Term Goal 3 - Progress (Week 2): Met Week 3:  PT Short Term Goal 1 (Week 3): STG=LTG due to ELOS Week 4:     Skilled Therapeutic Interventions/Progress Updates:  Session 1.   Pt received sitting in WC with ATP present following delivery of specialty WC, and agreeable to PT. PT instructed pt in WC mobility through day room x 76ft65fh ATP present to assess fit of WC and education on WC parts management. Noted to have mild clearance between thigh and contour cushion, but no adjustments abel to perform to WC foNorm[facility] Healthplexrest due to leg length and need for clearance from floor. Additional  WC propulsion through gym to weave through 8 cones x 2 with mod cues for visual scanning to the R to locate obstacles in turn. Pt also propelled to room x 176ft 34f supervision assist for safety and awareness of obstacels and doorway on the R. Patient returned to room and left sitting in [facility]Frank[facility] bell in reach and all needs met.   .   Session 2 Pt  received sitting in WC and agreeable to PT. Pt transported to rehab gym in WC. [facility] training with rail in hall x 30ft w28fmax A for safety and RAFO.   WC mobility.   Car transfer  Step training.   transfers  Bed mobility.   Pt left supine in bed Call bell in reach.       Therapy Documentation Precautions:  Precautions Precaution Comments: R hemiparesis, apraxic, global aphasia, diplopia Restrictions Weight Bearing Restrictions: No RLE Weight Bearing: Weight bearing as tolerated General:   Vital Signs:  Pain: Pain Assessment Pain Scale: Faces Pain Score: 0-No pain Mobility:   Locomotion :    Trunk/Postural Assessment :    Balance:   Exercises:   Other Treatments:      Therapy/Group: Individual Therapy  [name] [name], 11:17 AM

## 2021-07-06 NOTE — Patient Care Conference (Signed)
Inpatient RehabilitationTeam Conference and Plan of Care Update Date: 07/06/2021   Time: 10:19 AM    Patient Name: Henry Simmons      Medical Record Number: [id number]  Date of Birth: [birth year] Sex: Male         Room/Bed: 4W24C/4W24C-01 Payor Info: Payor: Advertising copywriter / Plan: [employer] OTHER / Product Type: *No Product type* /    Admit Date/Time:  06/14/2021  6:16 PM  Primary Diagnosis:  Acute ischemic left MCA stroke [facility])  Hospital Problems: Principal Problem:   Acute ischemic left MCA stroke [facility]) Active Problems:   Right hemiplegia (HCC)   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 Claudette Laws Social Worker Present: Lavera Guise, BSW Nurse Present: Chana Bode, RN PT Present: Grier Rocher, PT OT Present: Annye English, OT SLP Present: Eilene Ghazi, SLP PPS Coordinator present : Fae Pippin, SLP     Current Status/Progress Goal Weekly Team Focus  Bowel/Bladder   Incontinent of B/B. LBM 07/05/21  Decrease incontinent episodes  Time Toileting as planned.   Swallow/Nutrition/ Hydration   Dysphagia 2 with thin liquids  Least restrictive diet with Min A for adherence to aspiration precautions, particularly oral holding and pocketing  Dys 2 tolerance, fam edu   ADL's   mod SB tranfser to and from drop arm commode, UB/LB dressing, bathing EOB, total A toileting, S for oral care/self-feeding; family ed with wife initiated  downgraded to mod A overall BADL, total A for toileting  RUE NMR, self-care/transfer/balance retraining, midline orientation, pt/family/DME/AE education   Mobility   Requires continued cues prior to moving d/t high impulsivity ::: Bed mobility = CGA/MinA; Squat pivot transfer to R = Min/ ModA, stand pivot to L = Mod/ MaxA, slide board transfer with increased cues and CGA/  MinA; Ambulation = short distance bouts (~46ft) MaxA for knee block and RLE advancement using L HR or 3-musketeer technique  Bed mobility at SUP level, Transfers at [employer], Ambulation with modA, stairs with ModA, w/c mobility with SUP  continued gait training, sitting and standing balance, R attn, transfers to R and L using slide board, NMR for R hemibody, family education with wife and other available family   Communication   mod-to-max A  Mod A for auditory comprehension; Max A for verbal expression  multimodal/supported communication interventions (written binary choice prompts, picture cues, gesture use, yes/no system) family education   Safety/Cognition/ Behavioral Observations            Pain   Denies pain  Pain <3/10.  Assess Q4 hrly and prn. Intervene as ordered.   Skin   No skin breakdown  Maintain skin integrity  Asses Q shift.     Discharge Planning:  Discharging home with spouse, mother and sister providing 24/7 supervision. HH approved through [facility]. DME ordered   Team Discussion: Patient with poor motor return post left MCA and continue to note right inattention, loss of balance to right, aphasia and extensor tone but active movement in right lower extremity and incorporating in care.   Patient on target to meet rehab goals: no, patient currently incontinent despite toileting protocol in place. Needs min - mod assist for slide board to DA- BSC. Need mod assist for ADLs at the bed level. Needs mod - max assist for communication and mod assist for yes/no questions.   *See Care Plan and  progress notes for long and short-term goals.   Revisions to Treatment Plan:  Downgraded goals W/C eval Car transfer Multimodal tools for communication  Teaching Needs: Safety, transfers, toileting, medications, dietary modifications, secondary risk management, etc  Current Barriers to Discharge: Decreased caregiver support, Home enviroment access/layout, and Incontinence  Possible  Resolutions to Barriers: Family education; 24/7 supervision recommended [facility] including SLP follow up services DME: DA-BSC, hospital bed     Medical Summary Current Status: some increased RLE activation, severe aphasia and apraxia persist, cont encourage po  Barriers to Discharge: Medical stability;Other (comments)  Barriers to Discharge Comments: transfers requiring at least mod A Possible Resolutions to Barriers/Weekly Focus: concentrate on family training , work with [facility] vendor for appropriate fit  , enc po fluid   Continued Need for Acute Rehabilitation Level of Care: The patient requires daily medical management by a physician with specialized training in physical medicine and rehabilitation for the following reasons: Direction of a multidisciplinary physical rehabilitation program to maximize functional independence : Yes Medical management of patient stability for increased activity during participation in an intensive rehabilitation regime.: Yes Analysis of laboratory values and/or radiology reports with any subsequent need for medication adjustment and/or medical intervention. : Yes   I attest that I was present, lead the team conference, and concur with the assessment and plan of the team.   Chana Bode B 07/06/2021, 3:53 PM

## 2021-07-06 NOTE — Plan of Care (Signed)
  Problem: RH Balance Goal: LTG: Patient will maintain dynamic sitting balance (OT) Description: LTG:  Patient will maintain dynamic sitting balance with assistance during activities of daily living (OT) Outcome: Completed/Met Goal: LTG Patient will maintain dynamic standing with ADLs (OT) Description: LTG:  Patient will maintain dynamic standing balance with assist during activities of daily living (OT)  Outcome: Completed/Met   Problem: Sit to Stand Goal: LTG:  Patient will perform sit to stand in prep for activites of daily living with assistance level (OT) Description: LTG:  Patient will perform sit to stand in prep for activites of daily living with assistance level (OT) Outcome: Completed/Met   Problem: RH Eating Goal: LTG Patient will perform eating w/assist, cues/equip (OT) Description: LTG: Patient will perform eating with assist, with/without cues using equipment (OT) Outcome: Completed/Met   Problem: RH Grooming Goal: LTG Patient will perform grooming w/assist,cues/equip (OT) Description: LTG: Patient will perform grooming with assist, with/without cues using equipment (OT) Outcome: Completed/Met   Problem: RH Bathing Goal: LTG Patient will bathe all body parts with assist levels (OT) Description: LTG: Patient will bathe all body parts with assist levels (OT) Outcome: Completed/Met   Problem: RH Dressing Goal: LTG Patient will perform upper body dressing (OT) Description: LTG Patient will perform upper body dressing with assist, with/without cues (OT). Outcome: Completed/Met Goal: LTG Patient will perform lower body dressing w/assist (OT) Description: LTG: Patient will perform lower body dressing with assist, with/without cues in positioning using equipment (OT) Outcome: Completed/Met   Problem: RH Toileting Goal: LTG Patient will perform toileting task (3/3 steps) with assistance level (OT) Description: LTG: Patient will perform toileting task (3/3 steps) with  assistance level (OT)  Outcome: Completed/Met   Problem: RH Functional Use of Upper Extremity Goal: LTG Patient will use RT/LT upper extremity as a (OT) Description: LTG: Patient will use right/left upper extremity as a stabilizer/gross assist/diminished/nondominant/dominant level with assist, with/without cues during functional activity (OT) Outcome: Completed/Met   Problem: RH Toilet Transfers Goal: LTG Patient will perform toilet transfers w/assist (OT) Description: LTG: Patient will perform toilet transfers with assist, with/without cues using equipment (OT) Outcome: Completed/Met

## 2021-07-06 NOTE — Progress Notes (Signed)
Nursing education held with mother and father. Wife not in attendance. Educated on medication administration, medications that can and can not be crushed. Mobility, turn and reposition at least every 2 hours. S/S of a stroke. No current questions with the parents.

## 2021-07-06 NOTE — Progress Notes (Signed)
Inpatient Rehabilitation Care Coordinator Discharge Note   Patient Details  Name: TUVIA WOODRICK MRN: [id number] Date of Birth: November 27, 1970   Discharge location: Home  Length of Stay: 23 Days  Discharge activity level: Min  Home/community participation: spouse/mother/sister  Patient response WV:PXTGGY Literacy - How often do you need to have someone help you when you read instructions, pamphlets, or other written material from your doctor or pharmacy?: Patient unable to respond  Patient response IR:SWNIOE Isolation - How often do you feel lonely or isolated from those around you?: Patient unable to respond  Services provided included: Pharmacy, TR, CM, RN, SLP, OT, PT, RD, MD  Financial Services:  Financial Services Utilized: Private Insurance [facility]  Choices offered to/list presented to: spouse  Follow-up services arranged:  Home Health Home Health Agency: [facility]         Patient response to transportation need: Is the patient able to respond to transportation needs?: No In the past 12 months, has lack of transportation kept you from medical appointments or from getting medications?: No In the past 12 months, has lack of transportation kept you from meetings, work, or from getting things needed for daily living?: No    Comments (or additional information):  Patient/Family verbalized understanding of follow-up arrangements:  Yes  Individual responsible for coordination of the follow-up plan: [name]  Confirmed correct DME delivered: Andria Rhein 07/06/2021    Andria Rhein

## 2021-07-06 NOTE — Progress Notes (Signed)
Speech Language Pathology Discharge Summary  Patient Details  Name: Henry Simmons MRN: [id number] Date of Birth: 1970/12/27  Today's Date: 07/06/2021 SLP Individual Time: 7628-3151 SLP Individual Time Calculation (min): 30 min  Skilled Therapeutic Interventions:  Skilled ST treatment focused on language goals. Pt's family was present for OT session but had to leave and did not return in time for SLP session for additional education. SLP facilitated multimodal communication through pointing to field of 2-3 written choices with mod A verbal/visual cues with 60% accuracy. Utilized gestures and yes/no communication board and max A for responses to yes/no questions with ~50% accuracy. Pt with less consistency this date and appearing increasingly frustrated with difficulty deciphering pt's needs. Pt with visible attempts to vocalize however unable to initiate phonation despite max cues. Pt left with pill crusher per request from spouse, and yes/no communication board to take home. Patient was left in wheelchair with alarm activated and immediate needs within reach at end of session. Continue per current plan of care.    Patient has met 3 of 3 long term goals.  Patient to discharge at overall Mod;Max level.  Reasons goals not met: None; all goals met   Clinical Impression/Discharge Summary: Patient has made slow yet functional gains and has met 3 of 3 long-term goals this admission. Patient requiring mod A for basic auditory comprehension and max A for expression of basic wants/needs through multimodal means. Pt continues to present with severe to profound non-fluent aphasia + apraxia. Patient is currently consuming a dysphagia 2 diet with thin liquids, meds crushed in puree. Patient and family education is complete and patient to discharge at overall mod-to-max A level. Patient's care partner is independent to provide the necessary physical and cognitive assistance at discharge. Patient would highly benefit  from continued SLP services in home health setting  targeting communication and oral deglutition deficits to maximize independence and decrease caregiver burden.  Care Partner:  Caregiver Able to Provide Assistance: Yes  Type of Caregiver Assistance: Physical;Cognitive  Recommendation:  Home Health SLP;24 hour supervision/assistance  Rationale for SLP Follow Up: Maximize functional communication;Reduce caregiver burden;Maximize swallowing safety;Maximize cognitive function and independence   Equipment: Pt provided with low-tech communication supports/picture based communication boards   Reasons for discharge: Discharged from hospital;Treatment goals met   Patient/Family Agrees with Progress Made and Goals Achieved: Yes    Patty Sermons 07/06/2021, 5:34 PM

## 2021-07-06 NOTE — Progress Notes (Signed)
PROGRESS NOTE   Subjective/Complaints:  Remains severely aphasic, repetitive gestures , yes/no only 60% accurate   ROS: Appears comfortable, limited due to communication deficit   Objective:   No results found. No results for input(s): WBC, HGB, HCT, PLT in the last 72 hours.  Recent Labs    07/04/21 0816 07/05/21 0520  NA 138  --   K 4.0  --   CL 107  --   CO2 24  --   GLUCOSE 148*  --   BUN 19  --   CREATININE 1.35* 1.26*  CALCIUM 9.6  --       Intake/Output Summary (Last 24 hours) at 07/06/2021 [id number] Last data filed at 07/05/2021 1857 Gross per 24 hour  Intake 118 ml  Output --  Net 118 ml         Physical Exam: Vital Signs Blood pressure (!) 131/97, pulse 79, temperature 98.2 F (36.8 C), resp. rate 17, height 6\' 2"  (1.88 m), weight 77.3 kg, SpO2 100 %.  General: No acute distress Mood and affect are appropriate Heart: Regular rate and rhythm no rubs murmurs or extra sounds Lungs: Clear to auscultation, breathing unlabored, no rales or wheezes Abdomen: Positive bowel sounds, soft nontender to palpation, nondistended Extremities: No clubbing, cyanosis, or edema Skin: No evidence of breakdown, no evidence of rash   Neurologic: globally aphasic. But able to close eyes to command , imitates gestures but perseverates, unable to perform body part ID  Cranial nerves II through XII intact, motor strength is 5/5 in left and 0/5 right deltoid, bicep, tricep, grip, hip flexor, knee extensors, ankle dorsiflexor and plantar flexor Sensory exam cannot assess due to aphasia   Musculoskeletal: No joint swelling .   Assessment/Plan: 1. Functional deficits which require 3+ hours per day of interdisciplinary therapy in a comprehensive inpatient rehab setting. Physiatrist is providing close team supervision and 24 hour management of active medical problems listed below. Physiatrist and rehab team continue to  assess barriers to discharge/monitor patient progress toward functional and medical goals  Care Tool:  Bathing    Body parts bathed by patient: Chest, Abdomen, Right arm, Right upper leg, Left upper leg, Face   Body parts bathed by helper: Left arm, Right lower leg, Left lower leg, Buttocks, Front perineal area     Bathing assist Assist Level: Moderate Assistance - Patient 50 - 74%     Upper Body Dressing/Undressing Upper body dressing   What is the patient wearing?: Pull over shirt    Upper body assist Assist Level: Moderate Assistance - Patient 50 - 74%    Lower Body Dressing/Undressing Lower body dressing      What is the patient wearing?: Pants     Lower body assist Assist for lower body dressing: Moderate Assistance - Patient 50 - 74%     Toileting Toileting    Toileting assist Assist for toileting: Moderate Assistance - Patient 50 - 74%     Transfers Chair/bed transfer  Transfers assist     Chair/bed transfer assist level: Moderate Assistance - Patient 50 - 74%     Locomotion Ambulation   Ambulation assist   Ambulation activity did not occur:  Safety/medical concerns          Walk 10 feet activity   Assist  Walk 10 feet activity did not occur: Safety/medical concerns        Walk 50 feet activity   Assist Walk 50 feet with 2 turns activity did not occur: Safety/medical concerns         Walk 150 feet activity   Assist Walk 150 feet activity did not occur: Safety/medical concerns         Walk 10 feet on uneven surface  activity   Assist Walk 10 feet on uneven surfaces activity did not occur: Safety/medical concerns         Wheelchair     Assist Is the patient using a wheelchair?: Yes Type of Wheelchair: Manual    Wheelchair assist level: Maximal Assistance - Patient 25 - 49% Max wheelchair distance: 150    Wheelchair 50 feet with 2 turns activity    Assist        Assist Level: Maximal Assistance -  Patient 25 - 49%   Wheelchair 150 feet activity     Assist      Assist Level: Maximal Assistance - Patient 25 - 49%   Blood pressure (!) 131/97, pulse 79, temperature 98.2 F (36.8 C), resp. rate 17, height 6\' 2"  (1.88 m), weight 77.3 kg, SpO2 100 %.  Medical Problem List and Plan: 1. 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  Impaired mobility: Continue Lovenox             -antiplatelet therapy: Aspirin 81 mg daily and Plavix 75 mg daily 3. Pain: N/A 4. Mood: Provide emotional support             -antipsychotic agents: N/A 5. Neuropsych: This patient is not capable of making decisions on his own behalf. 6. Skin/Wound Care: Routine skin checks 7. Fluids/Electrolytes/Nutrition: Routine in and outs with follow-up chemistries 8.  CAD/LAD stent/non-STEMI.  continue aspirin and Plavix.  Follow-up per cardiology services 9.  Hypertrophic cardiomyopathy/bradycardia.  Initially with temporary pacer.  No current plan for permanent pacemaker. continue Jardiance 10 mg daily. 10.  Hypertension.  continue Norvasc 2.5 mg daily, hydralazine 10 mg twice daily, Isordil 10 mg twice daily,Lopressor 12.5 mg BID, Cozaar 100 mg daily. Monitor with increased mobility Vitals:   07/05/21 2021 07/06/21 0556  BP: 134/83 (!) 131/97  Pulse: 84 79  Resp: 17 17  Temp: 98.6 F (37 C) 98.2 F (36.8 C)  SpO2: 100% 100%  Controlled [id number] although diastolic mildly elevated today will monitor   Cont Lopressor 12.5 mg BID, Isordil 10mg  BID, Norvasc 2.5  mg qd 11.  CKD stage III.  Follow-up chemistries-    Latest Ref Rng & Units 07/05/2021    5:20 AM 07/04/2021    8:16 AM 06/28/2021    5:47 AM  BMP  Glucose 70 - 99 mg/dL  148     BUN 6 -  20 mg/dL  19     Creatinine 0.61 - 1.24 mg/dL 1.26   1.35   1.40    Sodium 135 - 145 mmol/L  138     Potassium 3.5 - 5.1 mmol/L  4.0     Chloride 98 - 111 mmol/L  107     CO2 22 - 32 mmol/L  24     Calcium 8.9 - 10.3 mg/dL  9.6      BUN/Creat creeping up but GFR still normal , reducing Cozaar should help , enc po fluid  -if intake < 1011ml needs IV overnite - po intake improved > 1076ml x 2d -Continue to encourage fluid intake -d/c cozaar - recheck BMET  improved 07/05/21 12.  Hyperlipidemia.  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

## 2021-07-06 NOTE — Plan of Care (Signed)
Despite toileting protocol, patient remains incontinent of bowel and bladder

## 2021-07-06 NOTE — Plan of Care (Signed)
  Problem: RH Swallowing Goal: LTG Patient will consume least restrictive diet using compensatory strategies with assistance (SLP) Description: LTG:  Patient will consume least restrictive diet using compensatory strategies with assistance (SLP) Outcome: Completed/Met   Problem: RH Comprehension Communication Goal: LTG Patient will comprehend basic/complex auditory (SLP) Description: LTG: Patient will comprehend basic/complex auditory information with cues (SLP). Outcome: Completed/Met   Problem: RH Expression Communication Goal: LTG Patient will express needs/wants via multi-modal(SLP) Description: LTG:  Patient will express needs/wants via multi-modal communication (gestures/written, etc) with cues (SLP) Outcome: Completed/Met

## 2021-07-06 NOTE — Progress Notes (Signed)
Inpatient Rehabilitation Discharge Medication Review by a Pharmacist  A complete drug regimen review was completed for this patient to identify any potential clinically significant medication issues.  High Risk Drug Classes Is patient taking? Indication by Medication  Antipsychotic No   Anticoagulant No   Antibiotic    Opioid No   Antiplatelet Yes Aspirin/clopidrogel-CAD/LAD stent/non-STEMI  Hypoglycemics/insulin Yes Jardiance-Hypertrophic cardiomyopathy  Vasoactive Medication Yes Isordil/Lopressor/NitroSL-HTN / angina  Chemotherapy No   Other Yes Lipitor-HLD Protonix-GERD Vitamin C-supplement     Type of Medication Issue Identified Description of Issue Recommendation(s)  Drug Interaction(s) (clinically significant)     Duplicate Therapy     Allergy     No Medication Administration End Date     Incorrect Dose     Additional Drug Therapy Needed     Significant med changes from prior encounter (inform family/care partners about these prior to discharge).    Other       Clinically significant medication issues were identified that warrant physician communication and completion of prescribed/recommended actions by midnight of the next day:  No   Time spent performing this drug regimen review (minutes):  30   Shayann Garbutt BS, PharmD, BCPS Clinical Pharmacist 07/06/2021 1:22 PM  Contact: [phone] after 3 PM  "Be curious, not judgmental..." -Jamal Maes

## 2021-07-07 NOTE — Plan of Care (Signed)
  Problem: RH Balance Goal: LTG Patient will maintain dynamic sitting balance (PT) Description: LTG:  Patient will maintain dynamic sitting balance with assistance during mobility activities (PT) Outcome: Completed/Met Goal: LTG Patient will maintain dynamic standing balance (PT) Description: LTG:  Patient will maintain dynamic standing balance with assistance during mobility activities (PT) Outcome: Completed/Met   Problem: RH Bed Mobility Goal: LTG Patient will perform bed mobility with assist (PT) Description: LTG: Patient will perform bed mobility with assistance, with/without cues (PT). Outcome: Completed/Met   Problem: RH Bed to Chair Transfers Goal: LTG Patient will perform bed/chair transfers w/assist (PT) Description: LTG: Patient will perform bed to chair transfers with assistance (PT). Outcome: Completed/Met   Problem: RH Car Transfers Goal: LTG Patient will perform car transfers with assist (PT) Description: LTG: Patient will perform car transfers with assistance (PT). Outcome: Completed/Met   Problem: RH Ambulation Goal: LTG Patient will ambulate in controlled environment (PT) Description: LTG: Patient will ambulate in a controlled environment, # of feet with assistance (PT). Outcome: Completed/Met   Problem: RH Wheelchair Mobility Goal: LTG Patient will propel w/c in controlled environment (PT) Description: LTG: Patient will propel wheelchair in controlled environment, # of feet with assist (PT) Outcome: Completed/Met Goal: LTG Patient will propel w/c in home environment (PT) Description: LTG: Patient will propel wheelchair in home environment, # of feet with assistance (PT). Outcome: Completed/Met   Problem: RH Stairs Goal: LTG Patient will ambulate up and down stairs w/assist (PT) Description: LTG: Patient will ambulate up and down # of stairs with assistance (PT) Outcome: Completed/Met   

## 2021-07-07 NOTE — Progress Notes (Signed)
PROGRESS NOTE   Subjective/Complaints:  Pt alert smiling, aphasic  ROS: Appears comfortable, limited due to communication deficit   Objective:   No results found. No results for input(s): WBC, HGB, HCT, PLT in the last 72 hours.  Recent Labs    07/05/21 0520  CREATININE 1.26*      Intake/Output Summary (Last 24 hours) at 07/07/2021 0851 Last data filed at 07/07/2021 0700 Gross per 24 hour  Intake 238 ml  Output --  Net 238 ml         Physical Exam: Vital Signs Blood pressure (!) 120/97, pulse 79, temperature 98.2 F (36.8 C), resp. rate 18, height 6\' 2"  (1.88 m), weight 77.3 kg, SpO2 96 %.  General: No acute distress Mood and affect are appropriate Heart: Regular rate and rhythm no rubs murmurs or extra sounds Lungs: Clear to auscultation, breathing unlabored, no rales or wheezes Abdomen: Positive bowel sounds, soft nontender to palpation, nondistended Extremities: No clubbing, cyanosis, or edema Skin: No evidence of breakdown, no evidence of rash   Neurologic: globally aphasic. But able to close eyes to command , imitates gestures but perseverates, unable to perform body part ID  Cranial nerves II through XII intact, motor strength is 5/5 in left and 0/5 right deltoid, bicep, tricep, grip, hip flexor, knee extensors, ankle dorsiflexor and plantar flexor Sensory exam cannot assess due to aphasia   Musculoskeletal: No joint swelling .   Assessment/Plan: 1. Functional deficits which require 3+ hours per day of interdisciplinary therapy in a comprehensive inpatient rehab setting. Physiatrist is providing close team supervision and 24 hour management of active medical problems listed below. Physiatrist and rehab team continue to assess barriers to discharge/monitor patient progress toward functional and medical goals  Care Tool:  Bathing    Body parts bathed by patient: Chest, Abdomen, Right arm, Right  upper leg, Left upper leg, Face, Front perineal area   Body parts bathed by helper: Left arm, Right lower leg, Left lower leg, Buttocks, Front perineal area     Bathing assist Assist Level: Moderate Assistance - Patient 50 - 74%     Upper Body Dressing/Undressing Upper body dressing   What is the patient wearing?: Pull over shirt    Upper body assist Assist Level: Moderate Assistance - Patient 50 - 74%    Lower Body Dressing/Undressing Lower body dressing      What is the patient wearing?: Pants     Lower body assist Assist for lower body dressing: Moderate Assistance - Patient 50 - 74% (bed level)     Toileting Toileting    Toileting assist Assist for toileting: Total Assistance - Patient < 25%     Transfers Chair/bed transfer  Transfers assist     Chair/bed transfer assist level: Minimal Assistance - Patient > 75%     Locomotion Ambulation   Ambulation assist   Ambulation activity did not occur: Safety/medical concerns  Assist level: Maximal Assistance - Patient 25 - 49% Assistive device: Other (comment) Max distance: 30   Walk 10 feet activity   Assist  Walk 10 feet activity did not occur: Safety/medical concerns  Assist level: Maximal Assistance - Patient 25 - 49% Assistive  device: Orthosis, Other (comment)   Walk 50 feet activity   Assist Walk 50 feet with 2 turns activity did not occur: Safety/medical concerns  Assist level: 2 helpers Assistive device: Orthosis    Walk 150 feet activity   Assist Walk 150 feet activity did not occur: Safety/medical concerns  Assist level: 2 helpers Assistive device: Orthosis    Walk 10 feet on uneven surface  activity   Assist Walk 10 feet on uneven surfaces activity did not occur: Safety/medical concerns         Wheelchair     Assist Is the patient using a wheelchair?: Yes Type of Wheelchair: Manual    Wheelchair assist level: Supervision/Verbal cueing Max wheelchair distance: 150     Wheelchair 50 feet with 2 turns activity    Assist        Assist Level: Supervision/Verbal cueing   Wheelchair 150 feet activity     Assist      Assist Level: Supervision/Verbal cueing   Blood pressure (!) 120/97, pulse 79, temperature 98.2 F (36.8 C), resp. rate 18, height 6\' 2"  (1.88 m), weight 77.3 kg, SpO2 96 %.  Medical Problem List and Plan: 1. 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  Impaired mobility: Continue Lovenox             -antiplatelet therapy: Aspirin 81 mg daily and Plavix 75 mg daily 3. Pain: N/A 4. Mood: Provide emotional support             -antipsychotic agents: N/A 5. Neuropsych: This patient is not capable of making decisions on his own behalf. 6. Skin/Wound Care: Routine skin checks 7. Fluids/Electrolytes/Nutrition: Routine in and outs with follow-up chemistries 8.  CAD/LAD stent/non-STEMI.  continue aspirin and Plavix.  Follow-up per cardiology services 9.  Hypertrophic cardiomyopathy/bradycardia.  Initially with temporary pacer.  No current plan for permanent pacemaker. continue Jardiance 10 mg daily. 10.  Hypertension.  continue Norvasc 2.5 mg daily, hydralazine 10 mg twice daily, Isordil 10 mg twice daily,Lopressor 12.5 mg BID, Cozaar 100 mg daily. Monitor with increased mobility Vitals:   07/06/21 1937 07/07/21 0454  BP: 139/87 (!) 120/97  Pulse: 85 79  Resp: 18 18  Temp: 98 F (36.7 C) 98.2 F (36.8 C)  SpO2: 100% 96%  Controlled [id number] although diastolic mildly elevated today will monitor   Cont Lopressor 12.5 mg BID, Isordil 10mg  BID, Norvasc 2.5 mg qd 11.  CKD stage III.  Follow-up chemistries-    Latest Ref Rng & Units 07/05/2021    5:20 AM 07/04/2021    8:16 AM 06/28/2021    5:47 AM  BMP  Glucose 70 - 99 mg/dL  148     BUN 6  - 20 mg/dL  19     Creatinine 0.61 - 1.24 mg/dL 1.26   1.35   1.40    Sodium 135 - 145 mmol/L  138     Potassium 3.5 - 5.1 mmol/L  4.0     Chloride 98 - 111 mmol/L  107     CO2 22 - 32 mmol/L  24     Calcium 8.9 - 10.3 mg/dL  9.6     Improved off IVF, family will need to  enc fluid intake at home  12.  Hyperlipidemia.  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

## 2021-07-14 ENCOUNTER — Telehealth: Payer: Self-pay | Admitting: *Deleted

## 2021-07-14 NOTE — Telephone Encounter (Signed)
Transitional Care call--Mrs [facility] called the office about not hearing from the [facility] agency and reporting they did not have the referral from inpt rehab    Are you/is patient experiencing any problems since coming home? Are there any questions regarding any aspect of care? Just getting therapy started. Are there any questions regarding medications administration/dosing? Are meds being taken as prescribed? Patient should review meds with caller to confirm All medications present. Have there been any falls?NO Has Home Health been to the house and/or have they contacted you? If not, have you tried to contact them? Can we help you contact them? NO she called them and they did not have a referral. She spoke with Trula Ore who was to follow up on this but its been a week and still no contact. I have emailed Lavera Guise to follow up on this matter. Are bowels and bladder emptying properly? Are there any unexpected incontinence issues? If applicable, is patient following bowel/bladder programs? NO  Any fevers, problems with breathing, unexpected pain? Just in his stroke side shoulder it hurts when he uses it. Are there any skin problems or new areas of breakdown? NO Has the patient/family member arranged specialty MD follow up (ie cardiology/neurology/renal/surgical/etc)?  Can we help arrange? They have appt with our office to see NP and his appt with Dr Algie Coffer.  I have given her the number to call about the appt with Dr Pearlean Brownie. Does the patient need any other services or support that we can help arrange? NO Are caregivers following through as expected in assisting the patient? YES Has the patient quit smoking, drinking alcohol, or using drugs as recommended? YES  Appointment 08/15/21 @1 :40 arrive by 1:20 to see NP then back to see Dr Jacalyn Lefevre Alerted to watch for packet from our office in the mailbox with paperwork to fill out [address]

## 2021-07-19 ENCOUNTER — Encounter: Payer: 59 | Admitting: Registered Nurse

## 2021-07-21 ENCOUNTER — Telehealth: Payer: Self-pay

## 2021-07-21 NOTE — Telephone Encounter (Signed)
Lynden Ang called to get POC for speech. 2 week 2, 1 week 4. Verbal order given

## 2021-07-26 ENCOUNTER — Other Ambulatory Visit (HOSPITAL_COMMUNITY): Payer: Self-pay | Admitting: Interventional Radiology

## 2021-07-26 DIAGNOSIS — I771 Stricture of artery: Secondary | ICD-10-CM

## 2021-08-15 ENCOUNTER — Encounter: Payer: 59 | Admitting: Registered Nurse

## 2021-08-19 ENCOUNTER — Telehealth: Payer: Self-pay

## 2021-08-19 DIAGNOSIS — G8191 Hemiplegia, unspecified affecting right dominant side: Secondary | ICD-10-CM

## 2021-08-19 DIAGNOSIS — Z8673 Personal history of transient ischemic attack (TIA), and cerebral infarction without residual deficits: Secondary | ICD-10-CM

## 2021-08-19 DIAGNOSIS — I63512 Cerebral infarction due to unspecified occlusion or stenosis of left middle cerebral artery: Secondary | ICD-10-CM

## 2021-08-19 DIAGNOSIS — I63522 Cerebral infarction due to unspecified occlusion or stenosis of left anterior cerebral artery: Secondary | ICD-10-CM

## 2021-08-19 NOTE — Telephone Encounter (Signed)
Need script for Hemi walker sent to [phone]

## 2021-08-23 NOTE — Telephone Encounter (Signed)
Reasons-teach to ambulate due to stroke. Order submitted and faxed

## 2021-08-25 ENCOUNTER — Ambulatory Visit (HOSPITAL_COMMUNITY): Admission: RE | Admit: 2021-08-25 | Payer: 59 | Source: Ambulatory Visit

## 2021-09-01 ENCOUNTER — Ambulatory Visit (HOSPITAL_COMMUNITY)
Admission: RE | Admit: 2021-09-01 | Discharge: 2021-09-01 | Disposition: A | Discharge status: Home / Self Care | Payer: 59 | Source: Ambulatory Visit | Attending: Interventional Radiology | Admitting: Interventional Radiology

## 2021-09-01 DIAGNOSIS — I771 Stricture of artery: Secondary | ICD-10-CM

## 2021-09-02 HISTORY — PX: IR RADIOLOGIST EVAL & MGMT: IMG5224

## 2021-09-07 ENCOUNTER — Telehealth: Payer: Self-pay

## 2021-09-07 NOTE — Telephone Encounter (Signed)
Mr. Hoback fell out of his chair today. Per Tammy with [facility] he only has swelling in the chin area. Tammy was advised to  report the event to Mr. Rettke's PCP.  The family has been advised to do so as well.   (Dr. [address]ynn Banker is out of the office today). Tammy [phone].

## 2021-09-08 ENCOUNTER — Encounter: Payer: 59 | Attending: Registered Nurse | Admitting: Registered Nurse

## 2021-09-08 ENCOUNTER — Encounter: Payer: Self-pay | Admitting: Registered Nurse

## 2021-09-08 VITALS — BP 168/117 | HR 76 | Ht 74.0 in | Wt 190.0 lb

## 2021-09-08 DIAGNOSIS — G8191 Hemiplegia, unspecified affecting right dominant side: Secondary | ICD-10-CM

## 2021-09-08 DIAGNOSIS — I639 Cerebral infarction, unspecified: Secondary | ICD-10-CM

## 2021-09-08 DIAGNOSIS — I10 Essential (primary) hypertension: Secondary | ICD-10-CM

## 2021-09-08 DIAGNOSIS — I63512 Cerebral infarction due to unspecified occlusion or stenosis of left middle cerebral artery: Secondary | ICD-10-CM

## 2021-09-08 DIAGNOSIS — I214 Non-ST elevation (NSTEMI) myocardial infarction: Secondary | ICD-10-CM | POA: Diagnosis present

## 2021-09-08 DIAGNOSIS — R4701 Aphasia: Secondary | ICD-10-CM | POA: Insufficient documentation
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
## 2021-09-08 NOTE — Patient Instructions (Addendum)
Call was placed to Dr Algie Coffer:   Dr Algie Coffer stated to give Henry. Simmons Isosorbide 10 mg now and Metoprolol 12,5 mg  ( 1/2) tablet.   Tonight Henry Simmons should take  Metoprolol 25 mg( a whole tablet)  and Isosorbide  10 mg tablets, take 2 tablets = 20 mg tonight.   Keep  Blood Pressure Log Readings;  Call Dr Graciella Belton office with Blood Pressure readings in the morning.   Dr Algie Coffer changed Henry. Simmons Medication to  Metoprolol 25 mg ( whole tablet ) twice a day   Isosorbide 20 mg ( two tablets of 10 mg ) twice a day

## 2021-09-15 ENCOUNTER — Ambulatory Visit: Payer: 59

## 2021-09-15 ENCOUNTER — Ambulatory Visit: Payer: 59 | Admitting: Speech Pathology

## 2021-09-15 ENCOUNTER — Ambulatory Visit: Payer: 59 | Admitting: Occupational Therapy

## 2021-09-15 ENCOUNTER — Encounter: Payer: Self-pay | Admitting: Speech Pathology

## 2021-09-15 ENCOUNTER — Ambulatory Visit: Payer: 59 | Attending: Registered Nurse | Admitting: Physical Therapy

## 2021-09-15 ENCOUNTER — Encounter: Payer: Self-pay | Admitting: Occupational Therapy

## 2021-09-15 ENCOUNTER — Encounter: Payer: Self-pay | Admitting: Physical Therapy

## 2021-09-15 DIAGNOSIS — M6281 Muscle weakness (generalized): Secondary | ICD-10-CM | POA: Diagnosis present

## 2021-09-15 DIAGNOSIS — I63512 Cerebral infarction due to unspecified occlusion or stenosis of left middle cerebral artery: Secondary | ICD-10-CM | POA: Insufficient documentation

## 2021-09-15 DIAGNOSIS — R252 Cramp and spasm: Secondary | ICD-10-CM | POA: Insufficient documentation

## 2021-09-15 DIAGNOSIS — I639 Cerebral infarction, unspecified: Secondary | ICD-10-CM | POA: Insufficient documentation

## 2021-09-15 DIAGNOSIS — G8191 Hemiplegia, unspecified affecting right dominant side: Secondary | ICD-10-CM | POA: Diagnosis not present

## 2021-09-15 DIAGNOSIS — R2681 Unsteadiness on feet: Secondary | ICD-10-CM | POA: Diagnosis present

## 2021-09-15 DIAGNOSIS — R471 Dysarthria and anarthria: Secondary | ICD-10-CM | POA: Insufficient documentation

## 2021-09-15 DIAGNOSIS — I69351 Hemiplegia and hemiparesis following cerebral infarction affecting right dominant side: Secondary | ICD-10-CM | POA: Insufficient documentation

## 2021-09-15 DIAGNOSIS — M79601 Pain in right arm: Secondary | ICD-10-CM | POA: Diagnosis present

## 2021-09-15 DIAGNOSIS — R41840 Attention and concentration deficit: Secondary | ICD-10-CM | POA: Diagnosis present

## 2021-09-15 DIAGNOSIS — R482 Apraxia: Secondary | ICD-10-CM

## 2021-09-15 DIAGNOSIS — R4701 Aphasia: Secondary | ICD-10-CM | POA: Insufficient documentation

## 2021-09-15 DIAGNOSIS — R2689 Other abnormalities of gait and mobility: Secondary | ICD-10-CM | POA: Diagnosis present

## 2021-09-15 DIAGNOSIS — R29818 Other symptoms and signs involving the nervous system: Secondary | ICD-10-CM | POA: Insufficient documentation
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
## 2021-09-20 ENCOUNTER — Encounter: Payer: Self-pay | Admitting: Neurology

## 2021-09-20 ENCOUNTER — Ambulatory Visit (INDEPENDENT_AMBULATORY_CARE_PROVIDER_SITE_OTHER): Payer: 59 | Admitting: Neurology

## 2021-09-20 VITALS — BP 141/97 | HR 72 | Ht 74.0 in

## 2021-09-20 DIAGNOSIS — I69320 Aphasia following cerebral infarction: Secondary | ICD-10-CM | POA: Diagnosis not present

## 2021-09-20 DIAGNOSIS — G8110 Spastic hemiplegia affecting unspecified side: Secondary | ICD-10-CM | POA: Diagnosis not present
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
## 2021-09-20 NOTE — Patient Instructions (Signed)
I had a long d/w patient , his wife and father about his recent stroke, aphasia and spastic right hemiplegia,risk for recurrent stroke/TIAs, personally independently reviewed imaging studies and stroke evaluation results and answered questions.Continue aspirin 81 mg daily and Plavix  75 mg daily  for secondary stroke prevention given his significant multivessel intracranial atherosclerosis and maintain strict control of hypertension with blood pressure goal below 130/90, diabetes with hemoglobin A1c goal below 6.5% and lipids with LDL cholesterol goal below 70 mg/dL. I also advised the patient to eat a healthy diet with plenty of whole grains, cereals, fruits and vegetables, exercise regularly and maintain ideal body weight .continue outpatient physical, occupational and speech therapies.  Followup in the future with me in 6 months or call earlier if necessary.   Stroke Prevention Some medical conditions and behaviors can lead to a higher chance of having a stroke. You can help prevent a stroke by eating healthy, exercising, not smoking, and managing any medical conditions you have. Stroke is a leading cause of functional impairment. Primary prevention is particularly important because a majority of strokes are first-time events. Stroke changes the lives of not only those who experience a stroke but also their family and other caregivers. How can this condition affect me? A stroke is a medical emergency and should be treated right away. A stroke can lead to brain damage and can sometimes be life-threatening. If a person gets medical treatment right away, there is a better chance of surviving and recovering from a stroke. What can increase my risk? The following medical conditions may increase your risk of a stroke: Cardiovascular disease. High blood pressure (hypertension). Diabetes. High cholesterol. Sickle cell disease. Blood clotting disorders (hypercoagulable state). Obesity. Sleep disorders  (obstructive sleep apnea). Other risk factors include: Being older than age 44. Having a history of blood clots, stroke, or mini-stroke (transient ischemic attack, TIA). Genetic factors, such as race, ethnicity, or a family history of stroke. Smoking cigarettes or using other tobacco products. Taking birth control pills, especially if you also use tobacco. Heavy use of alcohol or drugs, especially cocaine and methamphetamine. Physical inactivity. What actions can I take to prevent this? Manage your health conditions High cholesterol levels. Eating a healthy diet is important for preventing high cholesterol. If cholesterol cannot be managed through diet alone, you may need to take medicines. Take any prescribed medicines to control your cholesterol as told by your health care provider. Hypertension. To reduce your risk of stroke, try to keep your blood pressure below 130/80. Eating a healthy diet and exercising regularly are important for controlling blood pressure. If these steps are not enough to manage your blood pressure, you may need to take medicines. Take any prescribed medicines to control hypertension as told by your health care provider. Ask your health care provider if you should monitor your blood pressure at home. Have your blood pressure checked every year, even if your blood pressure is normal. Blood pressure increases with age and some medical conditions. Diabetes. Eating a healthy diet and exercising regularly are important parts of managing your blood sugar (glucose). If your blood sugar cannot be managed through diet and exercise, you may need to take medicines. Take any prescribed medicines to control your diabetes as told by your health care provider. Get evaluated for obstructive sleep apnea. Talk to your health care provider about getting a sleep evaluation if you snore a lot or have excessive sleepiness. Make sure that any other medical conditions you have, such as  atrial fibrillation or atherosclerosis, are managed. Nutrition Follow instructions from your health care provider about what to eat or drink to help manage your health condition. These instructions may include: Reducing your daily calorie intake. Limiting how much salt (sodium) you use to 1,500 milligrams (mg) each day. Using only healthy fats for cooking, such as olive oil, canola oil, or sunflower oil. Eating healthy foods. You can do this by: Choosing foods that are high in fiber, such as whole grains, and fresh fruits and vegetables. Eating at least 5 servings of fruits and vegetables a day. Try to fill one-half of your plate with fruits and vegetables at each meal. Choosing lean protein foods, such as lean cuts of meat, poultry without skin, fish, tofu, beans, and nuts. Eating low-fat dairy products. Avoiding foods that are high in sodium. This can help lower blood pressure. Avoiding foods that have saturated fat, trans fat, and cholesterol. This can help prevent high cholesterol. Avoiding processed and prepared foods. Counting your daily carbohydrate intake.  Lifestyle If you drink alcohol: Limit how much you have to: 0-1 drink a day for women who are not pregnant. 0-2 drinks a day for men. Know how much alcohol is in your drink. In the [place] one drink equals one 12 oz bottle of beer (369mL), one 5 oz glass of wine (139mL), or one 1 oz glass of hard liquor (65mL). Do not use any products that contain nicotine or tobacco. These products include cigarettes, chewing tobacco, and vaping devices, such as e-cigarettes. If you need help quitting, ask your health care provider. Avoid secondhand smoke. Do not use drugs. Activity  Try to stay at a healthy weight. Get at least 30 minutes of exercise on most days, such as: Fast walking. Biking. Swimming. Medicines Take over-the-counter and prescription medicines only as told by your health care provider. Aspirin or blood thinners  (antiplatelets or anticoagulants) may be recommended to reduce your risk of forming blood clots that can lead to stroke. Avoid taking birth control pills. Talk to your health care provider about the risks of taking birth control pills if: You are over 19 years old. You smoke. You get very bad headaches. You have had a blood clot. Where to find more information American Stroke Association: www.strokeassociation.org Get help right away if: You or a loved one has any symptoms of a stroke. "BE FAST" is an easy way to remember the main warning signs of a stroke: B - Balance. Signs are dizziness, sudden trouble walking, or loss of balance. E - Eyes. Signs are trouble seeing or a sudden change in vision. F - Face. Signs are sudden weakness or numbness of the face, or the face or eyelid drooping on one side. A - Arms. Signs are weakness or numbness in an arm. This happens suddenly and usually on one side of the body. S - Speech. Signs are sudden trouble speaking, slurred speech, or trouble understanding what people say. T - Time. Time to call emergency services. Write down what time symptoms started. You or a loved one has other signs of a stroke, such as: A sudden, severe headache with no known cause. Nausea or vomiting. Seizure. These symptoms may represent a serious problem that is an emergency. Do not wait to see if the symptoms will go away. Get medical help right away. Call your local emergency services (911 in the [place] Do not drive yourself to the hospital. Summary You can help to prevent a stroke by eating healthy, exercising, not smoking,  limiting alcohol intake, and managing any medical conditions you may have. Do not use any products that contain nicotine or tobacco. These include cigarettes, chewing tobacco, and vaping devices, such as e-cigarettes. If you need help quitting, ask your health care provider. Remember "BE FAST" for warning signs of a stroke. Get help right away if you or a  loved one has any of these signs. This information is not intended to replace advice given to you by your health care provider. Make sure you discuss any questions you have with your health care provider. Document Revised: 09/01/2019 Document Reviewed: 09/01/2019 Elsevier Patient Education  2023 [employer].

## 2021-09-21 ENCOUNTER — Ambulatory Visit: Payer: 59 | Admitting: Speech Pathology

## 2021-09-21 ENCOUNTER — Ambulatory Visit: Payer: 59

## 2021-09-21 DIAGNOSIS — R471 Dysarthria and anarthria: Secondary | ICD-10-CM

## 2021-09-21 DIAGNOSIS — M6281 Muscle weakness (generalized): Secondary | ICD-10-CM

## 2021-09-21 DIAGNOSIS — R2681 Unsteadiness on feet: Secondary | ICD-10-CM

## 2021-09-21 DIAGNOSIS — R2689 Other abnormalities of gait and mobility: Secondary | ICD-10-CM

## 2021-09-21 DIAGNOSIS — I69351 Hemiplegia and hemiparesis following cerebral infarction affecting right dominant side: Secondary | ICD-10-CM | POA: Diagnosis not present

## 2021-09-21 DIAGNOSIS — R4701 Aphasia: Secondary | ICD-10-CM

## 2021-09-21 DIAGNOSIS — R482 Apraxia: Secondary | ICD-10-CM

## 2021-09-21 NOTE — Patient Instructions (Signed)
List favorite foods:      List favorite teams:      List family members names:
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
## 2021-09-23 ENCOUNTER — Ambulatory Visit: Payer: 59

## 2021-09-23 DIAGNOSIS — I69351 Hemiplegia and hemiparesis following cerebral infarction affecting right dominant side: Secondary | ICD-10-CM | POA: Diagnosis not present

## 2021-09-23 DIAGNOSIS — M6281 Muscle weakness (generalized): Secondary | ICD-10-CM

## 2021-09-23 DIAGNOSIS — R2689 Other abnormalities of gait and mobility: Secondary | ICD-10-CM

## 2021-09-23 DIAGNOSIS — R2681 Unsteadiness on feet: Secondary | ICD-10-CM
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
## 2021-09-28 ENCOUNTER — Encounter: Payer: Self-pay | Admitting: Physical Therapy

## 2021-09-28 ENCOUNTER — Ambulatory Visit: Payer: 59 | Admitting: Physical Therapy

## 2021-09-28 ENCOUNTER — Encounter: Payer: 59 | Admitting: Speech Pathology

## 2021-09-28 DIAGNOSIS — R2689 Other abnormalities of gait and mobility: Secondary | ICD-10-CM

## 2021-09-28 DIAGNOSIS — I69351 Hemiplegia and hemiparesis following cerebral infarction affecting right dominant side: Secondary | ICD-10-CM

## 2021-09-28 DIAGNOSIS — R2681 Unsteadiness on feet: Secondary | ICD-10-CM

## 2021-09-28 DIAGNOSIS — M6281 Muscle weakness (generalized): Secondary | ICD-10-CM
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
## 2021-09-29 ENCOUNTER — Ambulatory Visit: Payer: 59 | Admitting: Physical Therapy

## 2021-09-29 ENCOUNTER — Encounter: Payer: Self-pay | Admitting: Occupational Therapy

## 2021-09-29 ENCOUNTER — Encounter: Payer: Self-pay | Admitting: Physical Therapy

## 2021-09-29 ENCOUNTER — Ambulatory Visit: Payer: 59 | Admitting: Occupational Therapy

## 2021-09-29 DIAGNOSIS — R2681 Unsteadiness on feet: Secondary | ICD-10-CM

## 2021-09-29 DIAGNOSIS — I69351 Hemiplegia and hemiparesis following cerebral infarction affecting right dominant side: Secondary | ICD-10-CM | POA: Diagnosis not present

## 2021-09-29 DIAGNOSIS — R2689 Other abnormalities of gait and mobility: Secondary | ICD-10-CM

## 2021-09-29 DIAGNOSIS — R252 Cramp and spasm: Secondary | ICD-10-CM

## 2021-09-29 DIAGNOSIS — R29818 Other symptoms and signs involving the nervous system: Secondary | ICD-10-CM

## 2021-09-29 DIAGNOSIS — R41840 Attention and concentration deficit: Secondary | ICD-10-CM

## 2021-09-29 DIAGNOSIS — M6281 Muscle weakness (generalized): Secondary | ICD-10-CM

## 2021-09-29 DIAGNOSIS — M79601 Pain in right arm: Secondary | ICD-10-CM
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
## 2021-10-04 ENCOUNTER — Ambulatory Visit: Payer: 59 | Admitting: Physical Therapy

## 2021-10-04 ENCOUNTER — Ambulatory Visit: Payer: 59 | Admitting: Speech Pathology

## 2021-10-04 DIAGNOSIS — R2689 Other abnormalities of gait and mobility: Secondary | ICD-10-CM

## 2021-10-04 DIAGNOSIS — I69351 Hemiplegia and hemiparesis following cerebral infarction affecting right dominant side: Secondary | ICD-10-CM

## 2021-10-04 DIAGNOSIS — R4701 Aphasia: Secondary | ICD-10-CM

## 2021-10-04 DIAGNOSIS — R2681 Unsteadiness on feet: Secondary | ICD-10-CM

## 2021-10-04 DIAGNOSIS — R482 Apraxia: Secondary | ICD-10-CM

## 2021-10-04 DIAGNOSIS — R471 Dysarthria and anarthria: Secondary | ICD-10-CM
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
## 2021-10-05 ENCOUNTER — Encounter: Payer: Self-pay | Admitting: Physical Therapy
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
## 2021-10-06 ENCOUNTER — Ambulatory Visit: Payer: 59 | Admitting: Physical Therapy

## 2021-10-06 ENCOUNTER — Encounter: Payer: 59 | Admitting: Occupational Therapy

## 2021-10-06 DIAGNOSIS — I69351 Hemiplegia and hemiparesis following cerebral infarction affecting right dominant side: Secondary | ICD-10-CM

## 2021-10-06 DIAGNOSIS — R2689 Other abnormalities of gait and mobility: Secondary | ICD-10-CM

## 2021-10-06 DIAGNOSIS — R29818 Other symptoms and signs involving the nervous system: Secondary | ICD-10-CM

## 2021-10-07 ENCOUNTER — Encounter: Payer: Self-pay | Admitting: Physical Therapy
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
## 2021-10-11 ENCOUNTER — Ambulatory Visit: Payer: 59 | Admitting: Physical Therapy

## 2021-10-11 DIAGNOSIS — I69351 Hemiplegia and hemiparesis following cerebral infarction affecting right dominant side: Secondary | ICD-10-CM | POA: Diagnosis not present

## 2021-10-11 DIAGNOSIS — R2689 Other abnormalities of gait and mobility: Secondary | ICD-10-CM

## 2021-10-11 DIAGNOSIS — M6281 Muscle weakness (generalized): Secondary | ICD-10-CM
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
## 2021-10-12 ENCOUNTER — Encounter: Payer: Self-pay | Admitting: Physical Therapy

## 2021-10-13 ENCOUNTER — Ambulatory Visit: Payer: 59 | Admitting: Occupational Therapy

## 2021-10-13 ENCOUNTER — Encounter: Payer: Self-pay | Admitting: Physical Medicine & Rehabilitation

## 2021-10-13 ENCOUNTER — Ambulatory Visit: Payer: 59 | Admitting: Physical Therapy

## 2021-10-13 ENCOUNTER — Encounter: Payer: Self-pay | Admitting: Physical Therapy

## 2021-10-13 ENCOUNTER — Encounter: Payer: 59 | Attending: Registered Nurse | Admitting: Physical Medicine & Rehabilitation

## 2021-10-13 VITALS — BP 165/107 | HR 71 | Temp 99.0°F

## 2021-10-13 DIAGNOSIS — I69351 Hemiplegia and hemiparesis following cerebral infarction affecting right dominant side: Secondary | ICD-10-CM

## 2021-10-13 DIAGNOSIS — M79601 Pain in right arm: Secondary | ICD-10-CM

## 2021-10-13 DIAGNOSIS — G8111 Spastic hemiplegia affecting right dominant side: Secondary | ICD-10-CM | POA: Insufficient documentation

## 2021-10-13 DIAGNOSIS — R252 Cramp and spasm: Secondary | ICD-10-CM

## 2021-10-13 DIAGNOSIS — R2689 Other abnormalities of gait and mobility: Secondary | ICD-10-CM

## 2021-10-13 DIAGNOSIS — R29818 Other symptoms and signs involving the nervous system: Secondary | ICD-10-CM

## 2021-10-13 DIAGNOSIS — M6281 Muscle weakness (generalized): Secondary | ICD-10-CM

## 2021-10-13 DIAGNOSIS — R41840 Attention and concentration deficit: Secondary | ICD-10-CM

## 2021-10-13 NOTE — Patient Instructions (Signed)
Wear bean bag elbow splint at night time, remove splint if pain or pressure areas. If problems stop wearing splint and let your therapist know.   Elbow: Flexion   Use other hand to bend elbow with thumb toward the same shoulder. Do NOT force this motion. Hold _10___ seconds. Repeat _5-10___ times. Do _2-3__ sessions per day. CAUTION: Movement should be gentle, steady and slow.    Supination (Passive)   Keep elbow bent at right angle and held firmly at side. Use other hand to turn forearm until palm faces upward. Hold __10__ seconds. Repeat _5-10___ times. Do _2-3_ sessions per day.
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
## 2021-10-13 NOTE — Patient Instructions (Signed)
OnabotulinumtoxinA Injection (Medical Use) What is this medication? ONABOTULINUMTOXINA (o na BOTT you lye num tox in eh) treats severe muscle spasms. It may also be used to prevent migraine headaches. It can treat excessive sweating when other medications do not work well enough. This medicine may be used for other purposes; ask your health care provider or pharmacist if you have questions. COMMON BRAND NAME(S): Botox What should I tell my care team before I take this medication? They need to know if you have any of these conditions: Breathing problems Cerebral palsy spasms Difficulty urinating Heart problems History of surgery where this medication is going to be used Infection at the site where this medication is going to be used Myasthenia gravis or other neurologic disease Nerve or muscle disease Surgery plans Take medications that treat or prevent blood clots Thyroid problems An unusual or allergic reaction to botulinum toxin, albumin, other medications, foods, dyes, or preservatives Pregnant or trying to get pregnant Breast-feeding How should I use this medication? This medication is for injected into a muscle. It is given by your care team in a hospital or clinic setting. A special MedGuide will be given to you before each treatment. Be sure to read this information carefully each time. Talk to your care team about the use of this medication in children. While this medication may be prescribed for children as young as 2 years for selected conditions, precautions do apply. Overdosage: If you think you have taken too much of this medicine contact a poison control center or emergency room at once. NOTE: This medicine is only for you. Do not share this medicine with others. What if I miss a dose? This does not apply. What may interact with this medication? Aminoglycoside antibiotics, such as gentamicin, neomycin, tobramycin Muscle relaxants Other botulinum toxin injections This  list may not describe all possible interactions. Give your health care provider a list of all the medicines, herbs, non-prescription drugs, or dietary supplements you use. Also tell them if you smoke, drink alcohol, or use illegal drugs. Some items may interact with your medicine. What should I watch for while using this medication? Visit your care team for regular check ups. This medication will cause weakness in the muscle where it is injected. Tell your care team if you feel unusually weak in other muscles. Get medical help right away if you have problems with breathing, swallowing, or talking. This medication might make your eyelids droop or make you see blurry or double. If you have weak muscles or trouble seeing do not drive a car, use machinery, or do other dangerous activities. This medication contains albumin from human blood. It may be possible to pass an infection in this medication, but no cases have been reported. Talk to your care team about the risks and benefits of this medication. If your activities have been limited by your condition, go back to your regular routine slowly after treatment with this medication. What side effects may I notice from receiving this medication? Side effects that you should report to your care team as soon as possible: Allergic reactions--skin rash, itching, hives, swelling of the face, lips, tongue, or throat Dryness or irritation of the eyes, eye pain, change in vision, sensitivity to light Infection--fever, chills, cough, sore throat, wounds that don't heal, pain or trouble when passing urine, general feeling of discomfort or being unwell Spread of botulinum toxin effects--unusual weakness or fatigue, blurry or double vision, trouble swallowing, hoarseness or trouble speaking, trouble breathing, loss of bladder   control Trouble passing urine Side effects that usually do not require medical attention (report these to your care team if they continue or are  bothersome): Dry mouth Eyelid drooping Fatigue Headache Pain, redness, or irritation at injection site This list may not describe all possible side effects. Call your doctor for medical advice about side effects. You may report side effects to FDA at 1-800-FDA-1088. Where should I keep my medication? This medication is given in a hospital or clinic and will not be stored at home. NOTE: This sheet is a summary. It may not cover all possible information. If you have questions about this medicine, talk to your doctor, pharmacist, or health care provider.  2023 Elsevier/Gold Standard (2020-12-13 00:00:00)  
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
## 2021-10-18 ENCOUNTER — Ambulatory Visit: Payer: 59

## 2021-10-18 ENCOUNTER — Ambulatory Visit: Payer: 59 | Attending: Registered Nurse | Admitting: Occupational Therapy

## 2021-10-18 ENCOUNTER — Encounter: Payer: Self-pay | Admitting: Occupational Therapy

## 2021-10-18 DIAGNOSIS — R471 Dysarthria and anarthria: Secondary | ICD-10-CM | POA: Diagnosis present

## 2021-10-18 DIAGNOSIS — R2689 Other abnormalities of gait and mobility: Secondary | ICD-10-CM | POA: Insufficient documentation

## 2021-10-18 DIAGNOSIS — R4701 Aphasia: Secondary | ICD-10-CM

## 2021-10-18 DIAGNOSIS — R41840 Attention and concentration deficit: Secondary | ICD-10-CM | POA: Insufficient documentation

## 2021-10-18 DIAGNOSIS — M79601 Pain in right arm: Secondary | ICD-10-CM | POA: Insufficient documentation

## 2021-10-18 DIAGNOSIS — R2681 Unsteadiness on feet: Secondary | ICD-10-CM | POA: Insufficient documentation

## 2021-10-18 DIAGNOSIS — M6281 Muscle weakness (generalized): Secondary | ICD-10-CM | POA: Insufficient documentation

## 2021-10-18 DIAGNOSIS — R252 Cramp and spasm: Secondary | ICD-10-CM | POA: Insufficient documentation

## 2021-10-18 DIAGNOSIS — R482 Apraxia: Secondary | ICD-10-CM | POA: Insufficient documentation

## 2021-10-18 DIAGNOSIS — I69351 Hemiplegia and hemiparesis following cerebral infarction affecting right dominant side: Secondary | ICD-10-CM | POA: Insufficient documentation
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
## 2021-10-20 ENCOUNTER — Encounter: Payer: Self-pay | Admitting: Physical Therapy

## 2021-10-20 ENCOUNTER — Ambulatory Visit: Payer: 59 | Admitting: Speech Pathology

## 2021-10-20 ENCOUNTER — Encounter: Payer: Self-pay | Admitting: Occupational Therapy

## 2021-10-20 ENCOUNTER — Ambulatory Visit: Payer: 59 | Admitting: Occupational Therapy

## 2021-10-20 ENCOUNTER — Ambulatory Visit: Payer: 59 | Admitting: Physical Therapy

## 2021-10-20 DIAGNOSIS — R471 Dysarthria and anarthria: Secondary | ICD-10-CM

## 2021-10-20 DIAGNOSIS — M79601 Pain in right arm: Secondary | ICD-10-CM

## 2021-10-20 DIAGNOSIS — R2689 Other abnormalities of gait and mobility: Secondary | ICD-10-CM

## 2021-10-20 DIAGNOSIS — R482 Apraxia: Secondary | ICD-10-CM

## 2021-10-20 DIAGNOSIS — R4701 Aphasia: Secondary | ICD-10-CM

## 2021-10-20 DIAGNOSIS — R252 Cramp and spasm: Secondary | ICD-10-CM

## 2021-10-20 DIAGNOSIS — I69351 Hemiplegia and hemiparesis following cerebral infarction affecting right dominant side: Secondary | ICD-10-CM | POA: Diagnosis not present

## 2021-10-20 DIAGNOSIS — R41840 Attention and concentration deficit: Secondary | ICD-10-CM

## 2021-10-20 DIAGNOSIS — M6281 Muscle weakness (generalized): Secondary | ICD-10-CM
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
## 2021-10-20 NOTE — Patient Instructions (Signed)
Extension (Passive)    Wife/family member to lift hand at wrist as far as possible, allow fingers to curl. Keep wrist in alignment with forearm.  Hold _10___ seconds. Repeat _5___ times. Do __2__ sessions per day.   CAREGIVER ASSISTED: Straighten Fingers    Place arm on table. Caregiver straightens fingers, allow wrist to bend as needed. Patient looks at hand. Hold _10__ seconds. _5__ reps per set, __2_ sets per day   Flexion (Passive)    Sitting upright close to table, placed folded towel on table. Place Rt hand/forearm on table and hold top of Rt wrist w/ Lt hand, slide forearm forward along table, bending from the waist until a stretch is felt. Hold __5-10__ seconds. (Will not be going as far as picture shows)  Repeat __10__ times. Do _2___ sessions per day.
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
## 2021-10-26 ENCOUNTER — Ambulatory Visit: Payer: 59 | Admitting: Occupational Therapy

## 2021-10-26 ENCOUNTER — Ambulatory Visit: Payer: 59

## 2021-10-26 ENCOUNTER — Ambulatory Visit: Payer: 59 | Admitting: Speech Pathology

## 2021-10-26 DIAGNOSIS — I69351 Hemiplegia and hemiparesis following cerebral infarction affecting right dominant side: Secondary | ICD-10-CM | POA: Diagnosis not present

## 2021-10-26 DIAGNOSIS — R41840 Attention and concentration deficit: Secondary | ICD-10-CM

## 2021-10-26 DIAGNOSIS — M6281 Muscle weakness (generalized): Secondary | ICD-10-CM

## 2021-10-26 DIAGNOSIS — R2689 Other abnormalities of gait and mobility: Secondary | ICD-10-CM

## 2021-10-26 DIAGNOSIS — R482 Apraxia: Secondary | ICD-10-CM

## 2021-10-26 DIAGNOSIS — R4701 Aphasia: Secondary | ICD-10-CM

## 2021-10-26 DIAGNOSIS — M79601 Pain in right arm: Secondary | ICD-10-CM

## 2021-10-26 DIAGNOSIS — R471 Dysarthria and anarthria: Secondary | ICD-10-CM

## 2021-10-26 DIAGNOSIS — R2681 Unsteadiness on feet: Secondary | ICD-10-CM

## 2021-10-26 DIAGNOSIS — R252 Cramp and spasm: Secondary | ICD-10-CM
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
## 2021-10-26 NOTE — Patient Instructions (Signed)
LOUD AND PROUD  BIG MOUTH MOVEMENTS    Deep breath + AHHHHH  Deep breath + EEEEEE  Deep breath + OOOOOH  Steelers   49ers   Browns  Here we go, [employer]   Monday Night Football   Touchdown   Turn it up   Fifty three   [employer]--- if we're having trouble saying, we try singing! Like we did today with turn it up.

## 2021-10-28 ENCOUNTER — Ambulatory Visit: Payer: 59

## 2021-10-28 ENCOUNTER — Ambulatory Visit: Payer: 59 | Admitting: Speech Pathology

## 2021-10-28 DIAGNOSIS — R2689 Other abnormalities of gait and mobility: Secondary | ICD-10-CM

## 2021-10-28 DIAGNOSIS — R2681 Unsteadiness on feet: Secondary | ICD-10-CM

## 2021-10-28 DIAGNOSIS — I69351 Hemiplegia and hemiparesis following cerebral infarction affecting right dominant side: Secondary | ICD-10-CM

## 2021-10-28 DIAGNOSIS — R4701 Aphasia: Secondary | ICD-10-CM

## 2021-10-28 DIAGNOSIS — R471 Dysarthria and anarthria: Secondary | ICD-10-CM

## 2021-10-28 DIAGNOSIS — M6281 Muscle weakness (generalized): Secondary | ICD-10-CM

## 2021-10-28 DIAGNOSIS — R482 Apraxia: Secondary | ICD-10-CM

## 2021-10-28 NOTE — Patient Instructions (Signed)
Practice each phrase or sentence multiple times.   Say it together looking at each other  Once Cadarius gets that close production, then practice 3-5 more times.   Let's create a list of 10-15 functional phrases Jaquawn may say at home:
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
## 2021-11-14 ENCOUNTER — Ambulatory Visit: Payer: 59 | Attending: Registered Nurse | Admitting: Occupational Therapy

## 2021-11-14 ENCOUNTER — Encounter: Payer: Self-pay | Admitting: Occupational Therapy

## 2021-11-14 ENCOUNTER — Ambulatory Visit: Payer: 59 | Admitting: Speech Pathology

## 2021-11-14 DIAGNOSIS — M6281 Muscle weakness (generalized): Secondary | ICD-10-CM | POA: Insufficient documentation

## 2021-11-14 DIAGNOSIS — R471 Dysarthria and anarthria: Secondary | ICD-10-CM | POA: Insufficient documentation

## 2021-11-14 DIAGNOSIS — R2689 Other abnormalities of gait and mobility: Secondary | ICD-10-CM | POA: Diagnosis present

## 2021-11-14 DIAGNOSIS — M79601 Pain in right arm: Secondary | ICD-10-CM | POA: Diagnosis present

## 2021-11-14 DIAGNOSIS — R252 Cramp and spasm: Secondary | ICD-10-CM | POA: Insufficient documentation

## 2021-11-14 DIAGNOSIS — R4701 Aphasia: Secondary | ICD-10-CM | POA: Diagnosis present

## 2021-11-14 DIAGNOSIS — R482 Apraxia: Secondary | ICD-10-CM | POA: Diagnosis present

## 2021-11-14 DIAGNOSIS — I69351 Hemiplegia and hemiparesis following cerebral infarction affecting right dominant side: Secondary | ICD-10-CM | POA: Diagnosis present

## 2021-11-14 DIAGNOSIS — R2681 Unsteadiness on feet: Secondary | ICD-10-CM | POA: Insufficient documentation
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
## 2021-11-14 NOTE — Patient Instructions (Signed)
Football  [employer]
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
## 2021-11-21 ENCOUNTER — Ambulatory Visit: Payer: 59 | Admitting: Speech Pathology

## 2021-11-21 DIAGNOSIS — R4701 Aphasia: Secondary | ICD-10-CM

## 2021-11-21 DIAGNOSIS — I69351 Hemiplegia and hemiparesis following cerebral infarction affecting right dominant side: Secondary | ICD-10-CM | POA: Diagnosis not present

## 2021-11-21 DIAGNOSIS — R471 Dysarthria and anarthria: Secondary | ICD-10-CM

## 2021-11-21 DIAGNOSIS — R482 Apraxia: Secondary | ICD-10-CM
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
## 2021-11-23 ENCOUNTER — Encounter: Payer: Self-pay | Admitting: Speech Pathology

## 2021-11-23 ENCOUNTER — Ambulatory Visit: Payer: 59 | Admitting: Speech Pathology

## 2021-11-23 DIAGNOSIS — R482 Apraxia: Secondary | ICD-10-CM

## 2021-11-23 DIAGNOSIS — I69351 Hemiplegia and hemiparesis following cerebral infarction affecting right dominant side: Secondary | ICD-10-CM | POA: Diagnosis not present

## 2021-11-23 DIAGNOSIS — R4701 Aphasia: Secondary | ICD-10-CM

## 2021-11-23 DIAGNOSIS — R471 Dysarthria and anarthria: Secondary | ICD-10-CM

## 2021-11-23 NOTE — Patient Instructions (Signed)
  A J  Ri car do  B rice  La  bria  Tre  nice  Jari Pigg  Mil ton  Pa tri cia  Ta sah
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
## 2021-11-28 ENCOUNTER — Ambulatory Visit: Payer: 59 | Admitting: Occupational Therapy

## 2021-11-28 DIAGNOSIS — I69351 Hemiplegia and hemiparesis following cerebral infarction affecting right dominant side: Secondary | ICD-10-CM

## 2021-11-28 DIAGNOSIS — M79601 Pain in right arm: Secondary | ICD-10-CM
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
## 2021-11-30 ENCOUNTER — Ambulatory Visit: Payer: 59 | Admitting: Speech Pathology

## 2021-11-30 ENCOUNTER — Encounter: Payer: Self-pay | Admitting: Speech Pathology

## 2021-11-30 DIAGNOSIS — R4701 Aphasia: Secondary | ICD-10-CM

## 2021-11-30 DIAGNOSIS — R482 Apraxia: Secondary | ICD-10-CM

## 2021-11-30 DIAGNOSIS — I69351 Hemiplegia and hemiparesis following cerebral infarction affecting right dominant side: Secondary | ICD-10-CM | POA: Diagnosis not present
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
## 2021-12-02 ENCOUNTER — Encounter: Payer: 59 | Attending: Registered Nurse | Admitting: Physical Medicine & Rehabilitation

## 2021-12-02 ENCOUNTER — Encounter: Payer: Self-pay | Admitting: Physical Medicine & Rehabilitation

## 2021-12-02 VITALS — BP 160/109 | HR 70 | Temp 98.7°F | Ht 74.0 in

## 2021-12-02 DIAGNOSIS — G8111 Spastic hemiplegia affecting right dominant side: Secondary | ICD-10-CM | POA: Diagnosis present

## 2021-12-02 MED ORDER — ONABOTULINUMTOXINA 100 UNITS IJ SOLR
300.0000 [IU] | Freq: Once | INTRAMUSCULAR | Status: AC
  Administered 2021-12-02: 300 [IU] via INTRAMUSCULAR

## 2021-12-02 NOTE — Patient Instructions (Signed)

## 2021-12-02 NOTE — Progress Notes (Signed)
Botox Injection for spasticity using needle EMG guidance  Dilution: 50 Units/ml Indication: Severe spasticity which interferes with ADL,mobility and/or  hygiene and is unresponsive to medication management and other conservative care Informed consent was obtained after describing risks and benefits of the procedure with the patient. This includes bleeding, bruising, infection, excessive weakness, or medication side effects. A REMS form is on file and signed. Needle: 27g 1" needle electrode Number of units per muscle Right Pec 100U Biceps 100 FCR 50 FDS 25 FDP 25 All injections were done after obtaining appropriate EMG activity and after negative drawback for blood. The patient tolerated the procedure well. Post procedure instructions were given. A followup appointment was made.

## 2021-12-06 ENCOUNTER — Ambulatory Visit: Payer: 59 | Admitting: Speech Pathology

## 2021-12-06 ENCOUNTER — Ambulatory Visit: Payer: 59 | Admitting: Physical Therapy

## 2021-12-06 DIAGNOSIS — I69351 Hemiplegia and hemiparesis following cerebral infarction affecting right dominant side: Secondary | ICD-10-CM

## 2021-12-06 DIAGNOSIS — R471 Dysarthria and anarthria: Secondary | ICD-10-CM

## 2021-12-06 DIAGNOSIS — R2689 Other abnormalities of gait and mobility: Secondary | ICD-10-CM

## 2021-12-06 DIAGNOSIS — M6281 Muscle weakness (generalized): Secondary | ICD-10-CM

## 2021-12-06 DIAGNOSIS — R4701 Aphasia: Secondary | ICD-10-CM

## 2021-12-06 DIAGNOSIS — R482 Apraxia: Secondary | ICD-10-CM
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
## 2021-12-08 ENCOUNTER — Ambulatory Visit: Payer: 59 | Admitting: Speech Pathology

## 2021-12-08 ENCOUNTER — Ambulatory Visit: Payer: 59

## 2021-12-08 DIAGNOSIS — I69351 Hemiplegia and hemiparesis following cerebral infarction affecting right dominant side: Secondary | ICD-10-CM

## 2021-12-08 DIAGNOSIS — R4701 Aphasia: Secondary | ICD-10-CM

## 2021-12-08 DIAGNOSIS — R471 Dysarthria and anarthria: Secondary | ICD-10-CM

## 2021-12-08 DIAGNOSIS — R2689 Other abnormalities of gait and mobility: Secondary | ICD-10-CM

## 2021-12-08 DIAGNOSIS — M6281 Muscle weakness (generalized): Secondary | ICD-10-CM

## 2021-12-08 DIAGNOSIS — R482 Apraxia: Secondary | ICD-10-CM

## 2021-12-08 DIAGNOSIS — R2681 Unsteadiness on feet: Secondary | ICD-10-CM
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
## 2021-12-12 ENCOUNTER — Ambulatory Visit: Payer: 59 | Admitting: Speech Pathology

## 2021-12-12 ENCOUNTER — Ambulatory Visit: Payer: 59 | Admitting: Physical Therapy

## 2021-12-12 DIAGNOSIS — R2689 Other abnormalities of gait and mobility: Secondary | ICD-10-CM

## 2021-12-12 DIAGNOSIS — R4701 Aphasia: Secondary | ICD-10-CM

## 2021-12-12 DIAGNOSIS — R471 Dysarthria and anarthria: Secondary | ICD-10-CM

## 2021-12-12 DIAGNOSIS — I69351 Hemiplegia and hemiparesis following cerebral infarction affecting right dominant side: Secondary | ICD-10-CM | POA: Diagnosis not present

## 2021-12-12 DIAGNOSIS — M6281 Muscle weakness (generalized): Secondary | ICD-10-CM

## 2021-12-12 DIAGNOSIS — R482 Apraxia: Secondary | ICD-10-CM

## 2021-12-12 DIAGNOSIS — R2681 Unsteadiness on feet: Secondary | ICD-10-CM
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
## 2021-12-14 ENCOUNTER — Ambulatory Visit: Payer: 59 | Attending: Registered Nurse

## 2021-12-14 DIAGNOSIS — R471 Dysarthria and anarthria: Secondary | ICD-10-CM | POA: Diagnosis present

## 2021-12-14 DIAGNOSIS — I69351 Hemiplegia and hemiparesis following cerebral infarction affecting right dominant side: Secondary | ICD-10-CM | POA: Insufficient documentation

## 2021-12-14 DIAGNOSIS — R252 Cramp and spasm: Secondary | ICD-10-CM | POA: Insufficient documentation

## 2021-12-14 DIAGNOSIS — R4701 Aphasia: Secondary | ICD-10-CM | POA: Diagnosis present

## 2021-12-14 DIAGNOSIS — I63512 Cerebral infarction due to unspecified occlusion or stenosis of left middle cerebral artery: Secondary | ICD-10-CM | POA: Diagnosis present

## 2021-12-14 DIAGNOSIS — R41840 Attention and concentration deficit: Secondary | ICD-10-CM | POA: Insufficient documentation

## 2021-12-14 DIAGNOSIS — R482 Apraxia: Secondary | ICD-10-CM | POA: Insufficient documentation

## 2021-12-14 DIAGNOSIS — R29818 Other symptoms and signs involving the nervous system: Secondary | ICD-10-CM | POA: Diagnosis present

## 2021-12-14 DIAGNOSIS — M6281 Muscle weakness (generalized): Secondary | ICD-10-CM | POA: Insufficient documentation

## 2021-12-14 DIAGNOSIS — M79601 Pain in right arm: Secondary | ICD-10-CM | POA: Insufficient documentation

## 2021-12-14 DIAGNOSIS — R2681 Unsteadiness on feet: Secondary | ICD-10-CM | POA: Diagnosis present

## 2021-12-14 DIAGNOSIS — R2689 Other abnormalities of gait and mobility: Secondary | ICD-10-CM | POA: Insufficient documentation
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
## 2021-12-16 ENCOUNTER — Ambulatory Visit: Payer: 59 | Admitting: Speech Pathology

## 2021-12-16 DIAGNOSIS — R4701 Aphasia: Secondary | ICD-10-CM

## 2021-12-16 DIAGNOSIS — R471 Dysarthria and anarthria: Secondary | ICD-10-CM

## 2021-12-16 DIAGNOSIS — R482 Apraxia: Secondary | ICD-10-CM

## 2021-12-16 DIAGNOSIS — M6281 Muscle weakness (generalized): Secondary | ICD-10-CM | POA: Diagnosis not present
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
## 2021-12-19 ENCOUNTER — Ambulatory Visit: Payer: 59 | Admitting: Speech Pathology

## 2021-12-19 ENCOUNTER — Ambulatory Visit: Payer: 59 | Admitting: Occupational Therapy

## 2021-12-19 ENCOUNTER — Ambulatory Visit: Payer: 59 | Admitting: Physical Therapy

## 2021-12-19 DIAGNOSIS — R482 Apraxia: Secondary | ICD-10-CM

## 2021-12-19 DIAGNOSIS — R2681 Unsteadiness on feet: Secondary | ICD-10-CM

## 2021-12-19 DIAGNOSIS — M6281 Muscle weakness (generalized): Secondary | ICD-10-CM

## 2021-12-19 DIAGNOSIS — I69351 Hemiplegia and hemiparesis following cerebral infarction affecting right dominant side: Secondary | ICD-10-CM

## 2021-12-19 DIAGNOSIS — R471 Dysarthria and anarthria: Secondary | ICD-10-CM

## 2021-12-19 DIAGNOSIS — R4701 Aphasia: Secondary | ICD-10-CM

## 2021-12-19 DIAGNOSIS — R2689 Other abnormalities of gait and mobility: Secondary | ICD-10-CM
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
## 2021-12-22 ENCOUNTER — Encounter: Payer: 59 | Admitting: Occupational Therapy

## 2021-12-22 ENCOUNTER — Ambulatory Visit: Payer: 59 | Admitting: Speech Pathology
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
## 2021-12-26 ENCOUNTER — Ambulatory Visit: Payer: 59 | Admitting: Occupational Therapy

## 2021-12-26 ENCOUNTER — Ambulatory Visit: Payer: 59 | Admitting: Physical Therapy

## 2021-12-26 ENCOUNTER — Encounter: Payer: Self-pay | Admitting: Speech Pathology

## 2021-12-26 ENCOUNTER — Ambulatory Visit: Payer: 59 | Admitting: Speech Pathology

## 2021-12-26 ENCOUNTER — Encounter: Payer: Self-pay | Admitting: Occupational Therapy

## 2021-12-26 DIAGNOSIS — I69351 Hemiplegia and hemiparesis following cerebral infarction affecting right dominant side: Secondary | ICD-10-CM

## 2021-12-26 DIAGNOSIS — R252 Cramp and spasm: Secondary | ICD-10-CM

## 2021-12-26 DIAGNOSIS — I63512 Cerebral infarction due to unspecified occlusion or stenosis of left middle cerebral artery: Secondary | ICD-10-CM

## 2021-12-26 DIAGNOSIS — R4701 Aphasia: Secondary | ICD-10-CM

## 2021-12-26 DIAGNOSIS — M6281 Muscle weakness (generalized): Secondary | ICD-10-CM

## 2021-12-26 DIAGNOSIS — R482 Apraxia: Secondary | ICD-10-CM

## 2021-12-26 DIAGNOSIS — R471 Dysarthria and anarthria: Secondary | ICD-10-CM

## 2021-12-26 DIAGNOSIS — R2681 Unsteadiness on feet: Secondary | ICD-10-CM

## 2021-12-26 DIAGNOSIS — M79601 Pain in right arm: Secondary | ICD-10-CM

## 2021-12-26 DIAGNOSIS — R2689 Other abnormalities of gait and mobility: Secondary | ICD-10-CM

## 2021-12-26 DIAGNOSIS — R41840 Attention and concentration deficit: Secondary | ICD-10-CM

## 2021-12-26 DIAGNOSIS — R29818 Other symptoms and signs involving the nervous system: Secondary | ICD-10-CM
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
## 2021-12-26 NOTE — Patient Instructions (Signed)
     Heat Home Program  Heat is used as part of your therapy for several reasons.  Heat increases blood flow, which promotes healing. Heat also relaxes muscles and joints which makes exercising easier.  It can be applied for __10-15_ minutes,  _up to 3_ sessions per day  Use one of the following methods that is most convenient for you  Heating pad: Follow instructions given by manufacturer.  Use on low-medium setting.    Moist heated towel: Soak towel in hot water or place damp towel in microwave and heat for 30 sec. Check temperature to determine if further time needed.  Wring out towel and wrap around affected area, cover with a plastic bag.  Can also wrap a dry towel around the plastic to help retain the heat.    Microwave gel pack: Follow instructions given by manufacturer.  Check temperature before application to ensure not hot enough to cause a burn    Rice sock: This can be made using a sock with no synthetic material and 1.5 cups of rice.  Pour the rice into the sock, tie a knot at the top.  Place in microwave for 1 minute, remove to check temperature to determine if further heating time is required prior to application  Be sure to check the skin periodically to ensure no excessive redness or blisters.  Use with caution in areas with decreased sensation 

## 2021-12-29 ENCOUNTER — Encounter: Payer: Self-pay | Admitting: Physical Therapy

## 2021-12-29 ENCOUNTER — Ambulatory Visit: Payer: 59 | Admitting: Physical Therapy

## 2021-12-29 ENCOUNTER — Encounter: Payer: Self-pay | Admitting: Occupational Therapy

## 2021-12-29 ENCOUNTER — Ambulatory Visit: Payer: 59 | Admitting: Occupational Therapy

## 2021-12-29 DIAGNOSIS — R2689 Other abnormalities of gait and mobility: Secondary | ICD-10-CM

## 2021-12-29 DIAGNOSIS — I69351 Hemiplegia and hemiparesis following cerebral infarction affecting right dominant side: Secondary | ICD-10-CM

## 2021-12-29 DIAGNOSIS — R252 Cramp and spasm: Secondary | ICD-10-CM

## 2021-12-29 DIAGNOSIS — M6281 Muscle weakness (generalized): Secondary | ICD-10-CM

## 2021-12-29 DIAGNOSIS — R2681 Unsteadiness on feet: Secondary | ICD-10-CM
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
## 2022-01-03 ENCOUNTER — Ambulatory Visit: Payer: 59 | Admitting: Physical Therapy

## 2022-01-03 ENCOUNTER — Encounter: Payer: 59 | Admitting: Speech Pathology

## 2022-01-04 ENCOUNTER — Ambulatory Visit: Payer: 59

## 2022-01-04 ENCOUNTER — Encounter: Payer: 59 | Admitting: Speech Pathology

## 2022-01-10 ENCOUNTER — Ambulatory Visit: Payer: 59 | Admitting: Physical Therapy

## 2022-01-10 ENCOUNTER — Ambulatory Visit: Payer: 59 | Admitting: Speech Pathology

## 2022-01-10 DIAGNOSIS — R2689 Other abnormalities of gait and mobility: Secondary | ICD-10-CM

## 2022-01-10 DIAGNOSIS — M6281 Muscle weakness (generalized): Secondary | ICD-10-CM | POA: Diagnosis not present

## 2022-01-10 DIAGNOSIS — I69351 Hemiplegia and hemiparesis following cerebral infarction affecting right dominant side: Secondary | ICD-10-CM

## 2022-01-10 DIAGNOSIS — R4701 Aphasia: Secondary | ICD-10-CM

## 2022-01-10 DIAGNOSIS — R482 Apraxia: Secondary | ICD-10-CM

## 2022-01-10 DIAGNOSIS — R471 Dysarthria and anarthria: Secondary | ICD-10-CM

## 2022-01-10 DIAGNOSIS — R2681 Unsteadiness on feet: Secondary | ICD-10-CM
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
## 2022-01-11 ENCOUNTER — Encounter: Payer: Self-pay | Admitting: Physical Therapy

## 2022-01-12 ENCOUNTER — Ambulatory Visit: Payer: 59 | Admitting: Speech Pathology

## 2022-01-12 ENCOUNTER — Encounter: Payer: Self-pay | Admitting: Physical Therapy

## 2022-01-12 ENCOUNTER — Ambulatory Visit: Payer: 59 | Admitting: Physical Therapy

## 2022-01-12 DIAGNOSIS — R471 Dysarthria and anarthria: Secondary | ICD-10-CM

## 2022-01-12 DIAGNOSIS — R2681 Unsteadiness on feet: Secondary | ICD-10-CM

## 2022-01-12 DIAGNOSIS — R482 Apraxia: Secondary | ICD-10-CM

## 2022-01-12 DIAGNOSIS — R2689 Other abnormalities of gait and mobility: Secondary | ICD-10-CM

## 2022-01-12 DIAGNOSIS — I69351 Hemiplegia and hemiparesis following cerebral infarction affecting right dominant side: Secondary | ICD-10-CM

## 2022-01-12 DIAGNOSIS — M6281 Muscle weakness (generalized): Secondary | ICD-10-CM | POA: Diagnosis not present

## 2022-01-12 DIAGNOSIS — R4701 Aphasia: Secondary | ICD-10-CM
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
## 2022-01-13 ENCOUNTER — Encounter: Payer: 59 | Attending: Registered Nurse | Admitting: Physical Medicine & Rehabilitation

## 2022-01-13 ENCOUNTER — Encounter: Payer: Self-pay | Admitting: Physical Medicine & Rehabilitation

## 2022-01-13 VITALS — BP 148/94 | HR 67 | Ht 74.0 in

## 2022-01-13 DIAGNOSIS — G8111 Spastic hemiplegia affecting right dominant side: Secondary | ICD-10-CM | POA: Insufficient documentation
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
## 2022-01-17 ENCOUNTER — Ambulatory Visit: Payer: 59 | Admitting: Physical Therapy
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
## 2022-01-19 ENCOUNTER — Encounter: Payer: Self-pay | Admitting: Physical Therapy

## 2022-01-19 ENCOUNTER — Ambulatory Visit: Payer: 59 | Attending: Registered Nurse | Admitting: Physical Therapy

## 2022-01-19 ENCOUNTER — Ambulatory Visit: Payer: 59 | Admitting: Occupational Therapy

## 2022-01-19 ENCOUNTER — Encounter: Payer: Self-pay | Admitting: Occupational Therapy

## 2022-01-19 DIAGNOSIS — R2681 Unsteadiness on feet: Secondary | ICD-10-CM | POA: Insufficient documentation

## 2022-01-19 DIAGNOSIS — R2689 Other abnormalities of gait and mobility: Secondary | ICD-10-CM | POA: Insufficient documentation

## 2022-01-19 DIAGNOSIS — M6281 Muscle weakness (generalized): Secondary | ICD-10-CM | POA: Insufficient documentation

## 2022-01-19 DIAGNOSIS — R29818 Other symptoms and signs involving the nervous system: Secondary | ICD-10-CM | POA: Diagnosis present

## 2022-01-19 DIAGNOSIS — R41840 Attention and concentration deficit: Secondary | ICD-10-CM | POA: Insufficient documentation

## 2022-01-19 DIAGNOSIS — R482 Apraxia: Secondary | ICD-10-CM | POA: Diagnosis present

## 2022-01-19 DIAGNOSIS — R252 Cramp and spasm: Secondary | ICD-10-CM | POA: Insufficient documentation

## 2022-01-19 DIAGNOSIS — I63512 Cerebral infarction due to unspecified occlusion or stenosis of left middle cerebral artery: Secondary | ICD-10-CM

## 2022-01-19 DIAGNOSIS — I69351 Hemiplegia and hemiparesis following cerebral infarction affecting right dominant side: Secondary | ICD-10-CM | POA: Insufficient documentation

## 2022-01-19 DIAGNOSIS — M79601 Pain in right arm: Secondary | ICD-10-CM | POA: Diagnosis present
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
## 2022-01-24 ENCOUNTER — Encounter: Payer: Self-pay | Admitting: Physical Therapy

## 2022-01-24 ENCOUNTER — Ambulatory Visit: Payer: 59 | Admitting: Physical Therapy

## 2022-01-24 VITALS — BP 164/107 | HR 74

## 2022-01-24 DIAGNOSIS — R29818 Other symptoms and signs involving the nervous system: Secondary | ICD-10-CM

## 2022-01-24 DIAGNOSIS — R2689 Other abnormalities of gait and mobility: Secondary | ICD-10-CM

## 2022-01-24 DIAGNOSIS — M6281 Muscle weakness (generalized): Secondary | ICD-10-CM

## 2022-01-24 DIAGNOSIS — R2681 Unsteadiness on feet: Secondary | ICD-10-CM
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
## 2022-01-26 ENCOUNTER — Ambulatory Visit: Payer: 59 | Admitting: Physical Therapy

## 2022-01-26 ENCOUNTER — Encounter: Payer: Self-pay | Admitting: Physical Therapy

## 2022-01-26 VITALS — BP 156/91 | HR 71

## 2022-01-26 DIAGNOSIS — I69351 Hemiplegia and hemiparesis following cerebral infarction affecting right dominant side: Secondary | ICD-10-CM

## 2022-01-26 DIAGNOSIS — R2689 Other abnormalities of gait and mobility: Secondary | ICD-10-CM | POA: Diagnosis not present

## 2022-01-26 DIAGNOSIS — M6281 Muscle weakness (generalized): Secondary | ICD-10-CM
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
## 2022-01-31 ENCOUNTER — Ambulatory Visit: Payer: 59 | Admitting: Physical Therapy

## 2022-01-31 ENCOUNTER — Encounter: Payer: 59 | Admitting: Occupational Therapy

## 2022-01-31 DIAGNOSIS — M6281 Muscle weakness (generalized): Secondary | ICD-10-CM

## 2022-01-31 DIAGNOSIS — R2689 Other abnormalities of gait and mobility: Secondary | ICD-10-CM

## 2022-01-31 DIAGNOSIS — I69351 Hemiplegia and hemiparesis following cerebral infarction affecting right dominant side: Secondary | ICD-10-CM

## 2022-01-31 DIAGNOSIS — R2681 Unsteadiness on feet: Secondary | ICD-10-CM

## 2022-02-01 ENCOUNTER — Encounter: Payer: Self-pay | Admitting: Physical Therapy
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
## 2022-02-09 ENCOUNTER — Ambulatory Visit: Payer: 59 | Admitting: Physical Therapy

## 2022-02-09 ENCOUNTER — Encounter: Payer: Self-pay | Admitting: Physical Therapy

## 2022-02-09 ENCOUNTER — Encounter: Payer: 59 | Admitting: Occupational Therapy

## 2022-02-09 DIAGNOSIS — R2689 Other abnormalities of gait and mobility: Secondary | ICD-10-CM

## 2022-02-09 DIAGNOSIS — R2681 Unsteadiness on feet: Secondary | ICD-10-CM

## 2022-02-09 DIAGNOSIS — I69351 Hemiplegia and hemiparesis following cerebral infarction affecting right dominant side: Secondary | ICD-10-CM
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
## 2022-02-14 ENCOUNTER — Encounter: Payer: 59 | Admitting: Occupational Therapy

## 2022-02-14 ENCOUNTER — Ambulatory Visit: Payer: 59 | Attending: Registered Nurse

## 2022-02-14 DIAGNOSIS — R2689 Other abnormalities of gait and mobility: Secondary | ICD-10-CM | POA: Diagnosis not present

## 2022-02-14 DIAGNOSIS — R2681 Unsteadiness on feet: Secondary | ICD-10-CM | POA: Insufficient documentation

## 2022-02-14 DIAGNOSIS — M6281 Muscle weakness (generalized): Secondary | ICD-10-CM | POA: Diagnosis present
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
## 2022-02-21 ENCOUNTER — Encounter: Payer: 59 | Admitting: Occupational Therapy

## 2022-02-24 ENCOUNTER — Ambulatory Visit: Payer: 59 | Admitting: Physical Medicine & Rehabilitation

## 2022-02-28 ENCOUNTER — Encounter: Payer: 59 | Admitting: Occupational Therapy

## 2022-03-09 ENCOUNTER — Encounter: Payer: Self-pay | Admitting: Neurology

## 2022-03-22 ENCOUNTER — Ambulatory Visit: Payer: 59 | Admitting: Neurology

## 2022-03-23 ENCOUNTER — Encounter: Payer: 59 | Attending: Registered Nurse | Admitting: Physical Medicine & Rehabilitation

## 2022-03-23 ENCOUNTER — Encounter: Payer: Self-pay | Admitting: Physical Medicine & Rehabilitation

## 2022-03-23 ENCOUNTER — Ambulatory Visit: Payer: 59 | Admitting: Physical Medicine & Rehabilitation

## 2022-03-23 VITALS — HR 66 | Ht 74.0 in | Wt 157.8 lb

## 2022-03-23 DIAGNOSIS — G8111 Spastic hemiplegia affecting right dominant side: Secondary | ICD-10-CM | POA: Diagnosis not present

## 2022-03-23 MED ORDER — ONABOTULINUMTOXINA 100 UNITS IJ SOLR
400.0000 [IU] | Freq: Once | INTRAMUSCULAR | Status: AC
  Administered 2022-03-23: 400 [IU] via INTRAMUSCULAR

## 2022-03-23 NOTE — Patient Instructions (Signed)

## 2022-03-23 NOTE — Progress Notes (Signed)
Botox Injection for spasticity using needle EMG guidance  Dilution: 50 Units/ml Indication: Severe spasticity which interferes with ADL,mobility and/or  hygiene and is unresponsive to medication management and other conservative care Informed consent was obtained after describing risks and benefits of the procedure with the patient. This includes bleeding, bruising, infection, excessive weakness, or medication side effects. A REMS form is on file and signed. Needle:  needle electrode Number of units per muscle Right Pec 100U Biceps 100 Brachiorad 50 FCU 50 FCR 50 FDS 25 FDP 25 All injections were done after obtaining appropriate EMG activity and after negative drawback for blood. The patient tolerated the procedure well. Post procedure instructions were given. A followup appointment was made.

## 2022-03-29 ENCOUNTER — Encounter: Payer: Self-pay | Admitting: Speech Pathology

## 2022-03-29 ENCOUNTER — Ambulatory Visit: Payer: 59 | Admitting: Speech Pathology

## 2022-03-29 ENCOUNTER — Ambulatory Visit: Payer: 59 | Attending: Registered Nurse

## 2022-03-29 DIAGNOSIS — R4701 Aphasia: Secondary | ICD-10-CM

## 2022-03-29 DIAGNOSIS — M6281 Muscle weakness (generalized): Secondary | ICD-10-CM | POA: Insufficient documentation

## 2022-03-29 DIAGNOSIS — R482 Apraxia: Secondary | ICD-10-CM

## 2022-03-29 DIAGNOSIS — R2689 Other abnormalities of gait and mobility: Secondary | ICD-10-CM | POA: Diagnosis present

## 2022-03-29 DIAGNOSIS — R2681 Unsteadiness on feet: Secondary | ICD-10-CM | POA: Insufficient documentation
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
## 2022-03-29 NOTE — Patient Instructions (Signed)
   [place] - work 30 minutes 5/7 days with Mardene Celeste or family/friend  Use the device and carrier phrases  I like....  I need....  I went to...  Practice different words/phrases depending on what is going on  in current events, family events, visitors,   Keep practicing your phrases you have at home  When you are going out, practice phrases you can use at a specific event
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
## 2022-04-05 ENCOUNTER — Encounter: Payer: 59 | Admitting: Occupational Therapy

## 2022-05-04 ENCOUNTER — Encounter: Payer: Self-pay | Admitting: Physical Medicine & Rehabilitation

## 2022-05-04 ENCOUNTER — Encounter: Payer: 59 | Attending: Registered Nurse | Admitting: Physical Medicine & Rehabilitation

## 2022-05-04 VITALS — BP 135/89 | HR 63 | Ht 74.0 in | Wt 164.8 lb

## 2022-05-04 DIAGNOSIS — G8111 Spastic hemiplegia affecting right dominant side: Secondary | ICD-10-CM | POA: Insufficient documentation
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
## 2022-05-16 ENCOUNTER — Ambulatory Visit (INDEPENDENT_AMBULATORY_CARE_PROVIDER_SITE_OTHER): Payer: 59 | Admitting: Neurology

## 2022-05-16 ENCOUNTER — Encounter: Payer: Self-pay | Admitting: Neurology

## 2022-05-16 VITALS — BP 146/96 | HR 69 | Ht 74.0 in

## 2022-05-16 DIAGNOSIS — I69851 Hemiplegia and hemiparesis following other cerebrovascular disease affecting right dominant side: Secondary | ICD-10-CM

## 2022-05-16 DIAGNOSIS — I69320 Aphasia following cerebral infarction: Secondary | ICD-10-CM | POA: Diagnosis not present

## 2022-05-16 NOTE — Patient Instructions (Signed)
I had a long d/w patient , his wife and father about his recent stroke, aphasia and spastic right hemiplegia,risk for recurrent stroke/TIAs, personally independently reviewed imaging studies and stroke evaluation results and answered questions.Continue aspirin 81 mg daily and Plavix  75 mg daily  for secondary stroke prevention given his significant multivessel intracranial atherosclerosis and coronary artery disease with cardiac stent and maintain strict control of hypertension with blood pressure goal below 130/90, diabetes with hemoglobin A1c goal below 6.5% and lipids with LDL cholesterol goal below 70 mg/dL. I also advised the patient to eat a healthy diet with plenty of whole grains, cereals, fruits and vegetables, exercise regularly and maintain ideal body weight .continue outpatient physical, occupational and speech therapies.  Check follow-up carotid ultrasound and transcranial Doppler study, lipid profile and hemoglobin A1c.  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  Return for follow-up in 1 year or call earlier if necessary.
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
## 2022-05-17 ENCOUNTER — Telehealth: Payer: Self-pay

## 2022-05-17 LAB — LIPID PANEL
Chol/HDL Ratio: 3.8 ratio (ref 0.0–5.0)
Cholesterol, Total: 109 mg/dL (ref 100–199)
HDL: 29 mg/dL — ABNORMAL LOW (ref >39)
LDL Chol Calc (NIH): 63 mg/dL (ref 0–99)
Triglycerides: 83 mg/dL (ref 0–149)
VLDL Cholesterol Cal: 17 mg/dL (ref 5–40)

## 2022-05-17 LAB — HEMOGLOBIN A1C
Est. average glucose Bld gHb Est-mCnc: 108 mg/dL
Hgb A1c MFr Bld: 5.4 % (ref 4.8–5.6)

## 2022-05-17 NOTE — Telephone Encounter (Signed)
Please call Mrs. Dillingham back; ASAP:  Mr. Ord received Botox therapy on 03/23/2022 by Dr. Letta Pate.  Mr. Mcclenton  insurance is requesting  a Prior Authorization  before paying. Wife's call back phone number is [phone].   Insurance call back phone number is [phone].

## 2022-05-28 NOTE — Progress Notes (Signed)
Kindly inform the patient that both cholesterol profile and screening test for diabetes was satisfactory.  No medication changes needed at this time

## 2022-05-29 ENCOUNTER — Telehealth: Payer: Self-pay

## 2022-05-29 NOTE — Telephone Encounter (Signed)
Message from [place], [place] was relayed to pt's wife, no questions or concerns.

## 2022-05-29 NOTE — Telephone Encounter (Signed)
-----   Message from Deatra James, RN sent at 05/29/2022  9:54 AM EDT -----  ----- Message ----- From: Micki Riley, MD Sent: 05/28/2022   2:21 PM EDT To: Orpah Cobb, MD; Gna-Pod 2 Results  Kindly inform the patient that both cholesterol profile and screening test for diabetes was satisfactory.  No medication changes needed at this time

## 2022-05-29 NOTE — Telephone Encounter (Signed)
*  PHONE STAFF CAN RELAY*  Contacted pt, LVM rq call back.  Please informed pt wife that both cholesterol profile and screening test for diabetes was satisfactory, normal limits.  No medication changes needed at this time

## 2022-05-31 ENCOUNTER — Emergency Department (HOSPITAL_COMMUNITY)
Admission: EM | Admit: 2022-05-31 | Discharge: 2022-05-31 | Disposition: A | Discharge status: Home / Self Care | Payer: Medicaid Other | Attending: Emergency Medicine | Admitting: Emergency Medicine

## 2022-05-31 ENCOUNTER — Other Ambulatory Visit: Payer: Self-pay

## 2022-05-31 ENCOUNTER — Emergency Department (HOSPITAL_COMMUNITY): Payer: Medicaid Other

## 2022-05-31 DIAGNOSIS — I69398 Other sequelae of cerebral infarction: Secondary | ICD-10-CM

## 2022-05-31 DIAGNOSIS — R569 Unspecified convulsions: Secondary | ICD-10-CM | POA: Insufficient documentation

## 2022-05-31 DIAGNOSIS — Z7902 Long term (current) use of antithrombotics/antiplatelets: Secondary | ICD-10-CM | POA: Diagnosis not present

## 2022-05-31 DIAGNOSIS — Z7982 Long term (current) use of aspirin: Secondary | ICD-10-CM | POA: Diagnosis not present

## 2022-05-31 LAB — CBC WITH DIFFERENTIAL/PLATELET
Abs Immature Granulocytes: 0.01 10*3/uL (ref 0.00–0.07)
Basophils Absolute: 0 10*3/uL (ref 0.0–0.1)
Basophils Relative: 1 %
Eosinophils Absolute: 0.1 10*3/uL (ref 0.0–0.5)
Eosinophils Relative: 2 %
HCT: 39.6 % (ref 39.0–52.0)
Hemoglobin: 12.7 g/dL — ABNORMAL LOW (ref 13.0–17.0)
Immature Granulocytes: 0 %
Lymphocytes Relative: 39 %
Lymphs Abs: 2.1 10*3/uL (ref 0.7–4.0)
MCH: 28.7 pg (ref 26.0–34.0)
MCHC: 32.1 g/dL (ref 30.0–36.0)
MCV: 89.6 fL (ref 80.0–100.0)
Monocytes Absolute: 0.4 10*3/uL (ref 0.1–1.0)
Monocytes Relative: 8 %
Neutro Abs: 2.8 10*3/uL (ref 1.7–7.7)
Neutrophils Relative %: 50 %
Platelets: 112 10*3/uL — ABNORMAL LOW (ref 150–400)
RBC: 4.42 MIL/uL (ref 4.22–5.81)
RDW: 14.6 % (ref 11.5–15.5)
WBC: 5.5 10*3/uL (ref 4.0–10.5)
nRBC: 0 % (ref 0.0–0.2)

## 2022-05-31 LAB — URINALYSIS, ROUTINE W REFLEX MICROSCOPIC
Bilirubin Urine: NEGATIVE
Glucose, UA: NEGATIVE mg/dL
Hgb urine dipstick: NEGATIVE
Ketones, ur: NEGATIVE mg/dL
Leukocytes,Ua: NEGATIVE
Nitrite: NEGATIVE
Protein, ur: NEGATIVE mg/dL
Specific Gravity, Urine: 1.015 (ref 1.005–1.030)
pH: 5 (ref 5.0–8.0)

## 2022-05-31 LAB — COMPREHENSIVE METABOLIC PANEL
ALT: 41 U/L (ref 0–44)
AST: 31 U/L (ref 15–41)
Albumin: 4 g/dL (ref 3.5–5.0)
Alkaline Phosphatase: 66 U/L (ref 38–126)
Anion gap: 13 (ref 5–15)
BUN: 19 mg/dL (ref 6–20)
CO2: 18 mmol/L — ABNORMAL LOW (ref 22–32)
Calcium: 9.4 mg/dL (ref 8.9–10.3)
Chloride: 109 mmol/L (ref 98–111)
Creatinine, Ser: 1.29 mg/dL — ABNORMAL HIGH (ref 0.61–1.24)
GFR, Estimated: >60 mL/min (ref >60)
Glucose, Bld: 95 mg/dL (ref 70–99)
Potassium: 3.4 mmol/L — ABNORMAL LOW (ref 3.5–5.1)
Sodium: 140 mmol/L (ref 135–145)
Total Bilirubin: 0.7 mg/dL (ref 0.3–1.2)
Total Protein: 6.3 g/dL — ABNORMAL LOW (ref 6.5–8.1)

## 2022-05-31 MED ORDER — SODIUM CHLORIDE 0.9 % IV SOLN
3000.0000 mg | Freq: Once | INTRAVENOUS | Status: AC
  Administered 2022-05-31: 3000 mg via INTRAVENOUS
  Filled 2022-05-31: qty 30

## 2022-05-31 MED ORDER — LEVETIRACETAM 500 MG PO TABS: 500.0000 mg | ORAL_TABLET | Freq: Two times a day (BID) | ORAL | 2 refills | Status: DC

## 2022-05-31 NOTE — ED Notes (Signed)
Pt had an episode of incontinence. Was cleaned up and repositioned. Place urinary condom cath for urine collection. Call bell within reach of caregiver at bedside

## 2022-05-31 NOTE — ED Notes (Signed)
Discharge instructions provided by edp were discussed with pt and caregiver [name] at bedside. Caregiver was able to verbalize understanding with no additional questions at this time.    Pt was wheelchair to car by ed staff. Caregiver states she is able to transfer pt into home per usual. Denies need for transportation home

## 2022-05-31 NOTE — ED Provider Notes (Signed)
[place] EMERGENCY DEPARTMENT AT [facility] Provider Note   CSN: [id number] Arrival date & time: 05/31/22  0049     History  Chief Complaint  Patient presents with   Seizures    Henry Simmons is a 52 y.o. male.  Patient brought to the emergency department by ambulance from home.  Patient had a witnessed seizure.  Patient has had a previous seizure which has left him with spastic paresis of the right upper extremity and inability to speak.  He had an episode tonight where he became unresponsive and his left extremities were rigid.  This was witnessed by EMS who report that before they could get him hooked up and an IV started, seizure activity stopped.  Patient does not have history of seizures.  Wife accompanies the patient to the ED.  She reports that he has not had any illness recently.  He saw his primary care doctor today for routine follow-up, there were no problems.       Home Medications Prior to Admission medications   Medication Sig Start Date End Date Taking? Authorizing Provider  levETIRAcetam (KEPPRA) 500 MG tablet Take 1 tablet (500 mg total) by mouth 2 (two) times daily. 05/31/22  Yes Mairyn Lenahan, Canary Brim, MD  acetaminophen (TYLENOL) 325 MG tablet Take 2 tablets (650 mg total) by mouth every 4 (four) hours as needed for mild pain (or temp > 37.5 C (99.5 F)). 06/14/21   Orpah Cobb, MD  amLODipine (NORVASC) 5 MG tablet Take 1 tablet every day by oral route. 12/27/19   [provider]  aspirin 81 MG EC tablet Take 1 tablet (81 mg total) by mouth daily. Swallow whole. 12/02/20   Corky Crafts, MD  atorvastatin (LIPITOR) 80 MG tablet Take 1 tablet (80 mg total) by mouth daily. 07/06/21   Angiulli, Mcarthur Rossetti, PA-C  clopidogrel (PLAVIX) 75 MG tablet Take 1 tablet (75 mg total) by mouth daily. 07/06/21   Angiulli, Mcarthur Rossetti, PA-C  empagliflozin (JARDIANCE) 10 MG TABS tablet Take 1 tablet (10 mg total) by mouth daily. 07/06/21   Angiulli, Mcarthur Rossetti, PA-C   isosorbide dinitrate (ISORDIL) 10 MG tablet Take 1 tablet (10 mg total) by mouth 2 (two) times daily. 07/06/21   Angiulli, Mcarthur Rossetti, PA-C  metoprolol tartrate (LOPRESSOR) 25 MG tablet Take 0.5 tablets (12.5 mg total) by mouth 2 (two) times daily. Patient taking differently: Take 25 mg by mouth 2 (two) times daily. 07/06/21   Angiulli, Mcarthur Rossetti, PA-C  nitroGLYCERIN (NITROSTAT) 0.4 MG SL tablet Place 1 tablet (0.4 mg total) under the tongue every 5 (five) minutes as needed for chest pain. Patient not taking: Reported on 05/16/2022 07/06/21   Angiulli, Mcarthur Rossetti, PA-C  pantoprazole (PROTONIX) 40 MG tablet Take 1 tablet (40 mg total) by mouth daily. 07/06/21   Angiulli, Mcarthur Rossetti, PA-C  Sodium Chloride Flush (NORMAL SALINE FLUSH) 0.9 % SOLN Inject 1000 mL as needed by intravenous route for 1 day. 11/30/20   [provider]  tiZANidine (ZANAFLEX) 2 MG tablet Take 2 mg by mouth at bedtime. 10/18/21   [provider]  vitamin C (ASCORBIC ACID) 500 MG tablet Take 1 tablet (500 mg total) by mouth daily. 07/06/21   Angiulli, Mcarthur Rossetti, PA-C      Allergies    Patient has no known allergies.    Review of Systems   Review of Systems  Physical Exam Updated Vital Signs BP (!) 147/98   Pulse 79   Temp 98.2  F (36.8 C) (Oral)   Resp 15   SpO2 99%  Physical Exam Vitals and nursing note reviewed.  Constitutional:      General: He is not in acute distress.    Appearance: He is well-developed.  HENT:     Head: Normocephalic and atraumatic.     Mouth/Throat:     Mouth: Mucous membranes are moist.  Eyes:     General: Vision grossly intact. Gaze aligned appropriately.     Extraocular Movements: Extraocular movements intact.     Conjunctiva/sclera: Conjunctivae normal.  Cardiovascular:     Rate and Rhythm: Normal rate and regular rhythm.     Pulses: Normal pulses.     Heart sounds: Normal heart sounds, S1 normal and S2 normal. No murmur heard.    No friction rub. No gallop.  Pulmonary:      Effort: Pulmonary effort is normal. No respiratory distress.     Breath sounds: Normal breath sounds.  Abdominal:     Palpations: Abdomen is soft.     Tenderness: There is no abdominal tenderness. There is no guarding or rebound.     Hernia: No hernia is present.  Musculoskeletal:        General: No swelling.     Cervical back: Full passive range of motion without pain, normal range of motion and neck supple. No pain with movement, spinous process tenderness or muscular tenderness. Normal range of motion.     Right lower leg: No edema.     Left lower leg: No edema.  Skin:    General: Skin is warm and dry.     Capillary Refill: Capillary refill takes less than 2 seconds.     Findings: No ecchymosis, erythema, lesion or wound.  Neurological:     Mental Status: He is alert.     GCS: GCS eye subscore is 4. GCS verbal subscore is 5. GCS motor subscore is 6.     Cranial Nerves: Cranial nerves 2-12 are intact.     Motor: No weakness or abnormal muscle tone.     Comments: Spastic hemiparesis noted on the right side  Psychiatric:        Mood and Affect: Mood normal.     ED Results / Procedures / Treatments   Labs (all labs ordered are listed, but only abnormal results are displayed) Labs Reviewed  CBC WITH DIFFERENTIAL/PLATELET - Abnormal; Notable for the following components:      Result Value   Hemoglobin 12.7 (*)    Platelets 112 (*)    All other components within normal limits  COMPREHENSIVE METABOLIC PANEL - Abnormal; Notable for the following components:   Potassium 3.4 (*)    CO2 18 (*)    Creatinine, Ser 1.29 (*)    Total Protein 6.3 (*)    All other components within normal limits  URINALYSIS, ROUTINE W REFLEX MICROSCOPIC - Abnormal; Notable for the following components:   Color, Urine STRAW (*)    All other components within normal limits    EKG EKG Interpretation  Date/Time:  Wednesday May 31 2022 00:54:18 EDT Ventricular Rate:  82 PR Interval:  168 QRS  Duration: 122 QT Interval:  421 QTC Calculation: 492 R Axis:   6 Text Interpretation: Sinus rhythm Nonspecific intraventricular conduction delay Inferolateral infarct, old Confirmed by Gilda Crease [id number]) on 05/31/2022 1:12:37 AM  Radiology CT HEAD WO CONTRAST ( )  Result Date: 05/31/2022 CLINICAL DATA:  Seizure, new-onset, no history of trauma EXAM: CT HEAD WITHOUT CONTRAST TECHNIQUE:  Contiguous axial images were obtained from the base of the skull through the vertex without intravenous contrast. RADIATION DOSE REDUCTION: This exam was performed according to the departmental dose-optimization program which includes automated exposure control, adjustment of the mA and/or kV according to patient size and/or use of iterative reconstruction technique. COMPARISON:  CT head 06/09/2021 CT head 06/07/2021, MRI head 06/07/2021 FINDINGS: Brain: Patchy and confluent areas of decreased attenuation are noted throughout the deep and periventricular white matter of the cerebral hemispheres bilaterally, compatible with chronic microvascular ischemic disease. Similar-appearing left middle cerebral artery territory encephalomalacia and gliosis (expected evolution of a chronic infarct). No evidence of large-territorial acute infarction. No parenchymal hemorrhage. No mass lesion. No extra-axial collection. No mass effect or midline shift. No hydrocephalus. Basilar cisterns are patent. Vascular: No hyperdense vessel. Skull: No acute fracture or focal lesion. Sinuses/Orbits: Right sphenoid mucosal thickening. Otherwise paranasal sinuses and mastoid air cells are clear. The orbits are unremarkable. Other: None. IMPRESSION: No acute intracranial abnormality in a patient with expected evolution of a chronic left middle cerebral artery infarction with encephalomalacia/gliosis. Electronically Signed   By: Tish Frederickson M.D.   On: 05/31/2022 01:59    Procedures Procedures    Medications Ordered in ED Medications   levETIRAcetam (KEPPRA) 3,000 mg in sodium chloride 0.9 % 250 mL IVPB (0 mg Intravenous Stopped 05/31/22 0327)    ED Course/ Medical Decision Making/ A&P                             Medical Decision Making Amount and/or Complexity of Data Reviewed Labs: ordered. Decision-making details documented in ED Course. Radiology: ordered and independent interpretation performed. Decision-making details documented in ED Course. ECG/medicine tests: ordered and independent interpretation performed. Decision-making details documented in ED Course.  Risk Prescription drug management.   Differential diagnosis considered includes, but not limited to: TIA; Stroke; ICH; Seizure; electrolyte abnormality; hypoglycemia; toxic/pharmacologic causes; CNS infection; psychiatric disorder   Patient presents to the emergency department for evaluation of what sounds like a new onset seizure.  Patient had a witnessed episode by EMS where he was rigid on the left side, not responding.  He has spastic hemiparesis on the right from prior left MCA infarct.  Patient is now back to his normal baseline.  He has not had any recent illness.  He is afebrile.  Vital signs are unremarkable.  Patient's workup has been reassuring.  This includes a head CT that does not show any evidence of bleed.  Presentation and findings discussed with Dr. Wilford Corner.  Recommends Keppra load and discharged with Keppra 500 twice daily.  Patient will follow-up with his neurologist, Dr. Pearlean Brownie in the outpatient setting.  No further workup recommended tonight.        Final Clinical Impression(s) / ED Diagnoses Final diagnoses:  Seizure    Rx / DC Orders ED Discharge Orders          Ordered    levETIRAcetam (KEPPRA) 500 MG tablet  2 times daily        05/31/22 0227              Gilda Crease, MD 05/31/22 0330

## 2022-05-31 NOTE — ED Triage Notes (Signed)
Pt BIB [facility] from home. Pt wife witness seizure lasting 3 mins. EMS states pt was contracted and clinched when they arrived, but since has resolved. No seizure hx. Pt nonverbal and R side deficit at baseline from previous stroke.

## 2022-05-31 NOTE — Progress Notes (Signed)
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 Back to his baseline per family and EDP. [facility] personally reviewed. No acute process.  IMPRESSION Post stroke epilepsy  Recs: Load Keppra 3g IV x1 Start Keppra 500 BID at home Seizure precautions. Follow up with [facility] stroke clinic NP in the next 4-6 weeks (last seen by Dr. Pearlean Brownie couple of weeks ago). Continue antiplatelets as advised by stroke neurology.    -- Henry Dikes, MD Neurologist [facility] Pager: [phone]  Approximately 8 minutes of time were spent reviewing the case and discussion with the calling provider, more than 50% of which was spent in discussion.

## 2022-06-09 ENCOUNTER — Ambulatory Visit (HOSPITAL_COMMUNITY): Payer: 59

## 2022-06-19 ENCOUNTER — Ambulatory Visit: Payer: 59 | Attending: Registered Nurse

## 2022-06-19 ENCOUNTER — Encounter: Payer: Self-pay | Admitting: Speech Pathology

## 2022-06-19 ENCOUNTER — Ambulatory Visit: Payer: 59

## 2022-06-19 ENCOUNTER — Ambulatory Visit: Payer: 59 | Admitting: Speech Pathology

## 2022-06-19 ENCOUNTER — Other Ambulatory Visit: Payer: Self-pay

## 2022-06-19 VITALS — BP 132/86

## 2022-06-19 DIAGNOSIS — R278 Other lack of coordination: Secondary | ICD-10-CM | POA: Diagnosis present

## 2022-06-19 DIAGNOSIS — R2681 Unsteadiness on feet: Secondary | ICD-10-CM

## 2022-06-19 DIAGNOSIS — I63512 Cerebral infarction due to unspecified occlusion or stenosis of left middle cerebral artery: Secondary | ICD-10-CM | POA: Diagnosis present

## 2022-06-19 DIAGNOSIS — R4701 Aphasia: Secondary | ICD-10-CM | POA: Diagnosis present

## 2022-06-19 DIAGNOSIS — R2689 Other abnormalities of gait and mobility: Secondary | ICD-10-CM | POA: Diagnosis present

## 2022-06-19 DIAGNOSIS — G8111 Spastic hemiplegia affecting right dominant side: Secondary | ICD-10-CM | POA: Insufficient documentation

## 2022-06-19 DIAGNOSIS — R482 Apraxia: Secondary | ICD-10-CM | POA: Insufficient documentation

## 2022-06-19 DIAGNOSIS — I69351 Hemiplegia and hemiparesis following cerebral infarction affecting right dominant side: Secondary | ICD-10-CM | POA: Diagnosis present

## 2022-06-19 DIAGNOSIS — M6281 Muscle weakness (generalized): Secondary | ICD-10-CM | POA: Insufficient documentation
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
## 2022-06-19 NOTE — Patient Instructions (Signed)
CAREGIVER ASSISTED: Shoulder Flexion    Caregiver raises arm forward, keeping one hand on shoulder blade. Patient looks at arm.   CAREGIVER ASSISTED: Shoulder Abduction    Caregiver raises arm away from body, keeping one hand on top of shoulder. Patient looks at arm.  CAREGIVER ASSISTED: Elbow Extension    Caregiver straightens elbow. Patient looks at arm.   Extension: ROM    Position (A) Helper: Stabilize left forearm. Grasp hand under palm. Motion (B) -Lift hand back, bending at wrist. -Helper assists with gentle pull at the same time. CA  CAREGIVER ASSISTED: [employer]    Place arm on table. Caregiver bends fingers into fist. Patient looks at hand.   Please do not force joints to move. If you feel resistance, stop there and hold the stretch. Allow gravity to help as able. Perform daily as OT instructed and demonstrated in clinic. These pictures are examples of what we went over.
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
## 2022-06-19 NOTE — Patient Instructions (Signed)
   Sena Crutchely [phone] - professor at [employer]   Ask about starting ST in the fall at [facility]  Let her know he has a communication device and is also verbal  Also ask her about the aphasia group as well  Bring in your device next session   TV ON  Watch TV  Lights On  Have to [employer]

## 2022-06-26 ENCOUNTER — Ambulatory Visit: Payer: 59 | Admitting: Physical Therapy

## 2022-06-26 ENCOUNTER — Ambulatory Visit: Payer: 59

## 2022-06-26 DIAGNOSIS — M6281 Muscle weakness (generalized): Secondary | ICD-10-CM

## 2022-06-26 DIAGNOSIS — I69351 Hemiplegia and hemiparesis following cerebral infarction affecting right dominant side: Secondary | ICD-10-CM | POA: Diagnosis not present

## 2022-06-26 DIAGNOSIS — R482 Apraxia: Secondary | ICD-10-CM

## 2022-06-26 DIAGNOSIS — G8111 Spastic hemiplegia affecting right dominant side: Secondary | ICD-10-CM

## 2022-06-26 DIAGNOSIS — R4701 Aphasia: Secondary | ICD-10-CM

## 2022-06-26 DIAGNOSIS — R2681 Unsteadiness on feet: Secondary | ICD-10-CM

## 2022-06-26 DIAGNOSIS — R2689 Other abnormalities of gait and mobility: Secondary | ICD-10-CM
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
## 2022-06-27 ENCOUNTER — Encounter: Payer: 59 | Admitting: Physical Medicine & Rehabilitation

## 2022-06-27 DIAGNOSIS — G8191 Hemiplegia, unspecified affecting right dominant side: Secondary | ICD-10-CM

## 2022-06-29 ENCOUNTER — Ambulatory Visit: Payer: 59 | Admitting: Speech Pathology

## 2022-06-29 ENCOUNTER — Ambulatory Visit: Payer: 59 | Admitting: Physical Therapy

## 2022-06-29 DIAGNOSIS — I69351 Hemiplegia and hemiparesis following cerebral infarction affecting right dominant side: Secondary | ICD-10-CM | POA: Diagnosis not present

## 2022-06-29 DIAGNOSIS — R2689 Other abnormalities of gait and mobility: Secondary | ICD-10-CM

## 2022-06-29 DIAGNOSIS — R4701 Aphasia: Secondary | ICD-10-CM

## 2022-06-29 DIAGNOSIS — R482 Apraxia: Secondary | ICD-10-CM
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
## 2022-06-29 NOTE — Patient Instructions (Addendum)
I like the steelers   Turn on the game   I want....   Spaghetti    Drink    Water    Ginger ale    TV off   TV on    To go... outside, inside, to bed

## 2022-06-30 ENCOUNTER — Encounter: Payer: Self-pay | Admitting: Physical Therapy

## 2022-07-04 ENCOUNTER — Ambulatory Visit: Payer: 59 | Admitting: Speech Pathology

## 2022-07-04 ENCOUNTER — Ambulatory Visit: Payer: 59 | Admitting: Physical Therapy

## 2022-07-04 DIAGNOSIS — R2681 Unsteadiness on feet: Secondary | ICD-10-CM

## 2022-07-04 DIAGNOSIS — R482 Apraxia: Secondary | ICD-10-CM

## 2022-07-04 DIAGNOSIS — I69351 Hemiplegia and hemiparesis following cerebral infarction affecting right dominant side: Secondary | ICD-10-CM | POA: Diagnosis not present

## 2022-07-04 DIAGNOSIS — R2689 Other abnormalities of gait and mobility: Secondary | ICD-10-CM

## 2022-07-04 DIAGNOSIS — I63512 Cerebral infarction due to unspecified occlusion or stenosis of left middle cerebral artery: Secondary | ICD-10-CM

## 2022-07-04 DIAGNOSIS — M6281 Muscle weakness (generalized): Secondary | ICD-10-CM

## 2022-07-04 DIAGNOSIS — R4701 Aphasia: Secondary | ICD-10-CM
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
## 2022-07-06 ENCOUNTER — Ambulatory Visit: Payer: 59 | Admitting: Speech Pathology

## 2022-07-06 ENCOUNTER — Encounter: Payer: Self-pay | Admitting: Physical Therapy

## 2022-07-06 ENCOUNTER — Ambulatory Visit: Payer: 59 | Admitting: Physical Therapy

## 2022-07-06 DIAGNOSIS — I69351 Hemiplegia and hemiparesis following cerebral infarction affecting right dominant side: Secondary | ICD-10-CM | POA: Diagnosis not present

## 2022-07-06 DIAGNOSIS — M6281 Muscle weakness (generalized): Secondary | ICD-10-CM

## 2022-07-06 DIAGNOSIS — R2689 Other abnormalities of gait and mobility: Secondary | ICD-10-CM
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
## 2022-07-07 ENCOUNTER — Encounter: Payer: Self-pay | Admitting: Physical Medicine & Rehabilitation

## 2022-07-07 ENCOUNTER — Encounter: Payer: 59 | Attending: Registered Nurse | Admitting: Physical Medicine & Rehabilitation

## 2022-07-07 VITALS — BP 137/88 | HR 66 | Temp 98.2°F | Ht 74.0 in | Wt 169.4 lb

## 2022-07-07 DIAGNOSIS — G8111 Spastic hemiplegia affecting right dominant side: Secondary | ICD-10-CM | POA: Diagnosis present

## 2022-07-07 MED ORDER — ONABOTULINUMTOXINA 100 UNITS IJ SOLR
400.0000 [IU] | Freq: Once | INTRAMUSCULAR | Status: AC
Start: 2022-07-07 — End: 2022-07-07
  Administered 2022-07-07: 400 [IU] via INTRAMUSCULAR

## 2022-07-07 MED ORDER — SODIUM CHLORIDE (PF) 0.9 % IJ SOLN
2.0000 mL | INTRAMUSCULAR | Status: DC | PRN
Start: 2022-07-07 — End: 2023-09-08
  Administered 2022-07-07: 2 mL via INTRAVENOUS

## 2022-07-07 NOTE — Patient Instructions (Signed)
.  akb 

## 2022-07-07 NOTE — Progress Notes (Signed)
Botox Injection for spasticity using needle EMG guidance  Dilution: 50 Units/ml Indication: Severe spasticity which interferes with ADL,mobility and/or  hygiene and is unresponsive to medication management and other conservative care Informed consent was obtained after describing risks and benefits of the procedure with the patient. This includes bleeding, bruising, infection, excessive weakness, or medication side effects. A REMS form is on file and signed. Needle:  needle electrode Number of units per muscle Right Pec 100U Biceps 50 Bracialis 50 Brachiorad 50 FCU 50 FCR 50 FDS 25 FDP 25 All injections were done after obtaining appropriate EMG activity and after negative drawback for blood. The patient tolerated the procedure well. Post procedure instructions were given. A followup appointment was made.

## 2022-07-11 ENCOUNTER — Ambulatory Visit: Payer: 59 | Admitting: Physical Therapy

## 2022-07-11 ENCOUNTER — Ambulatory Visit: Payer: 59 | Admitting: Speech Pathology

## 2022-07-11 ENCOUNTER — Encounter: Payer: Self-pay | Admitting: Physical Therapy

## 2022-07-11 DIAGNOSIS — R482 Apraxia: Secondary | ICD-10-CM

## 2022-07-11 DIAGNOSIS — R4701 Aphasia: Secondary | ICD-10-CM

## 2022-07-11 DIAGNOSIS — R2689 Other abnormalities of gait and mobility: Secondary | ICD-10-CM

## 2022-07-11 DIAGNOSIS — I69351 Hemiplegia and hemiparesis following cerebral infarction affecting right dominant side: Secondary | ICD-10-CM | POA: Diagnosis not present

## 2022-07-11 DIAGNOSIS — R2681 Unsteadiness on feet: Secondary | ICD-10-CM

## 2022-07-11 DIAGNOSIS — M6281 Muscle weakness (generalized): Secondary | ICD-10-CM
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
## 2022-07-13 ENCOUNTER — Ambulatory Visit: Payer: 59 | Admitting: Physical Therapy

## 2022-07-13 ENCOUNTER — Ambulatory Visit: Payer: 59 | Admitting: Speech Pathology

## 2022-07-13 ENCOUNTER — Encounter: Payer: Self-pay | Admitting: Physical Therapy

## 2022-07-13 DIAGNOSIS — I69351 Hemiplegia and hemiparesis following cerebral infarction affecting right dominant side: Secondary | ICD-10-CM

## 2022-07-13 DIAGNOSIS — R482 Apraxia: Secondary | ICD-10-CM

## 2022-07-13 DIAGNOSIS — R2681 Unsteadiness on feet: Secondary | ICD-10-CM

## 2022-07-13 DIAGNOSIS — M6281 Muscle weakness (generalized): Secondary | ICD-10-CM

## 2022-07-13 DIAGNOSIS — R2689 Other abnormalities of gait and mobility: Secondary | ICD-10-CM

## 2022-07-13 DIAGNOSIS — R4701 Aphasia: Secondary | ICD-10-CM
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
## 2022-07-18 ENCOUNTER — Ambulatory Visit: Payer: 59 | Attending: Registered Nurse | Admitting: Physical Therapy

## 2022-07-18 ENCOUNTER — Ambulatory Visit: Payer: 59 | Admitting: Speech Pathology

## 2022-07-18 DIAGNOSIS — M6281 Muscle weakness (generalized): Secondary | ICD-10-CM | POA: Diagnosis present

## 2022-07-18 DIAGNOSIS — R4701 Aphasia: Secondary | ICD-10-CM

## 2022-07-18 DIAGNOSIS — R2681 Unsteadiness on feet: Secondary | ICD-10-CM | POA: Insufficient documentation

## 2022-07-18 DIAGNOSIS — R2689 Other abnormalities of gait and mobility: Secondary | ICD-10-CM | POA: Diagnosis present

## 2022-07-18 DIAGNOSIS — R482 Apraxia: Secondary | ICD-10-CM | POA: Insufficient documentation

## 2022-07-18 DIAGNOSIS — I69351 Hemiplegia and hemiparesis following cerebral infarction affecting right dominant side: Secondary | ICD-10-CM | POA: Diagnosis not present

## 2022-07-18 DIAGNOSIS — R278 Other lack of coordination: Secondary | ICD-10-CM | POA: Diagnosis present
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
## 2022-07-20 ENCOUNTER — Ambulatory Visit: Payer: 59 | Admitting: Physical Therapy

## 2022-07-20 ENCOUNTER — Ambulatory Visit: Payer: 59 | Admitting: Speech Pathology

## 2022-07-24 ENCOUNTER — Ambulatory Visit (HOSPITAL_BASED_OUTPATIENT_CLINIC_OR_DEPARTMENT_OTHER)
Admission: RE | Admit: 2022-07-24 | Discharge: 2022-07-24 | Disposition: A | Discharge status: Home / Self Care | Payer: 59 | Source: Ambulatory Visit | Attending: Neurology | Admitting: Neurology

## 2022-07-24 ENCOUNTER — Ambulatory Visit (HOSPITAL_COMMUNITY)
Admission: RE | Admit: 2022-07-24 | Discharge: 2022-07-24 | Disposition: A | Discharge status: Home / Self Care | Payer: 59 | Source: Ambulatory Visit | Attending: Neurology | Admitting: Neurology

## 2022-07-24 DIAGNOSIS — I69320 Aphasia following cerebral infarction: Secondary | ICD-10-CM | POA: Insufficient documentation

## 2022-07-26 ENCOUNTER — Ambulatory Visit: Payer: 59 | Admitting: Physical Therapy

## 2022-07-26 ENCOUNTER — Ambulatory Visit: Payer: 59 | Admitting: Speech Pathology

## 2022-07-26 DIAGNOSIS — R2689 Other abnormalities of gait and mobility: Secondary | ICD-10-CM

## 2022-07-26 DIAGNOSIS — M6281 Muscle weakness (generalized): Secondary | ICD-10-CM

## 2022-07-26 DIAGNOSIS — I69351 Hemiplegia and hemiparesis following cerebral infarction affecting right dominant side: Secondary | ICD-10-CM | POA: Diagnosis not present

## 2022-07-26 DIAGNOSIS — R482 Apraxia: Secondary | ICD-10-CM

## 2022-07-26 DIAGNOSIS — R278 Other lack of coordination: Secondary | ICD-10-CM

## 2022-07-26 DIAGNOSIS — R2681 Unsteadiness on feet: Secondary | ICD-10-CM
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
## 2022-07-27 NOTE — Progress Notes (Signed)
Kindly inform the patient that carotid ultrasound study did not show significant narrowing of either carotid artery in the neck.

## 2022-07-27 NOTE — Progress Notes (Signed)
Kindly inform the patient that transcranial Doppler study was mostly normal except mild narrowing of one of the large blood vessels on the right side towards the front.  Continue medical therapy for now.  No need for any intervention at present

## 2022-07-31 ENCOUNTER — Telehealth: Payer: Self-pay

## 2022-07-31 NOTE — Telephone Encounter (Signed)
-----   Message from Micki Riley, MD sent at 07/27/2022  5:58 PM EDT ----- Henry Simmons inform the patient that carotid ultrasound study did not show significant narrowing of either carotid artery in the neck.

## 2022-07-31 NOTE — Telephone Encounter (Signed)
-----   Message from Micki Riley, MD sent at 07/27/2022  5:59 PM EDT ----- Henry Simmons inform the patient that transcranial Doppler study was mostly normal except mild narrowing of one of the large blood vessels on the right side towards the front.  Continue medical therapy for now.  No need for any intervention at present

## 2022-07-31 NOTE — Telephone Encounter (Signed)
Pt's wife, Tavarion Stoneback returning phone call from the nurse. Would like a call back.

## 2022-07-31 NOTE — Telephone Encounter (Signed)
Pt alternate contact/wife returned call, I informed her that carotid ultrasound study did not show significant narrowing of either carotid artery in the neck. Also, transcranial Doppler study was mostly normal except mild narrowing of one of the large blood vessels on the right side towards the front.  Continue medical therapy for now.  No need for any intervention at present, per Dr Pearlean Brownie. Advised to call the office back with any questions or concerns as she had none at this time.  She verbally understood and was appreciative.

## 2022-07-31 NOTE — Telephone Encounter (Signed)
I've already left patient one message today asking him to call us back to discuss results. Route to POD 3 when patient returns call.

## 2022-07-31 NOTE — Telephone Encounter (Signed)
I called patient to discuss. No answer, left a message asking him to call us back. Please route to POD 3 when patient returns call.

## 2022-08-22 ENCOUNTER — Encounter: Payer: 59 | Admitting: Physical Medicine & Rehabilitation

## 2022-09-27 ENCOUNTER — Ambulatory Visit: Payer: Medicaid Other | Admitting: Family Medicine

## 2022-09-29 ENCOUNTER — Encounter: Payer: Medicaid Other | Admitting: Physical Medicine & Rehabilitation

## 2022-09-29 ENCOUNTER — Encounter: Payer: Self-pay | Admitting: Physical Medicine & Rehabilitation

## 2022-09-29 VITALS — BP 152/102 | HR 60 | Ht 74.0 in | Wt 172.0 lb

## 2022-09-29 NOTE — Progress Notes (Unsigned)
Patient return for Botox injection however there was no insurance approval at this time.  Will need to reschedule pending insurance approval.  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  He also has pectoralis spasticity.
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
## 2022-10-02 ENCOUNTER — Ambulatory Visit (INDEPENDENT_AMBULATORY_CARE_PROVIDER_SITE_OTHER): Payer: Medicaid Other | Admitting: Family Medicine

## 2022-10-02 ENCOUNTER — Encounter: Payer: Self-pay | Admitting: Family Medicine

## 2022-10-02 ENCOUNTER — Ambulatory Visit: Payer: Medicaid Other | Admitting: Family Medicine

## 2022-10-02 VITALS — BP 152/94 | HR 67 | Temp 98.4°F

## 2022-10-02 DIAGNOSIS — Z23 Encounter for immunization: Secondary | ICD-10-CM | POA: Diagnosis not present

## 2022-10-02 DIAGNOSIS — I10 Essential (primary) hypertension: Secondary | ICD-10-CM | POA: Diagnosis not present

## 2022-10-02 DIAGNOSIS — Z1211 Encounter for screening for malignant neoplasm of colon: Secondary | ICD-10-CM

## 2022-10-02 DIAGNOSIS — E785 Hyperlipidemia, unspecified: Secondary | ICD-10-CM

## 2022-10-02 DIAGNOSIS — I69320 Aphasia following cerebral infarction: Secondary | ICD-10-CM

## 2022-10-02 DIAGNOSIS — Z1159 Encounter for screening for other viral diseases: Secondary | ICD-10-CM

## 2022-10-02 DIAGNOSIS — F17200 Nicotine dependence, unspecified, uncomplicated: Secondary | ICD-10-CM

## 2022-10-02 DIAGNOSIS — Z7689 Persons encountering health services in other specified circumstances: Secondary | ICD-10-CM

## 2022-10-02 DIAGNOSIS — R300 Dysuria: Secondary | ICD-10-CM
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
## 2022-10-02 NOTE — Patient Instructions (Signed)
Hypertension, Adult High blood pressure (hypertension) is when the force of blood pumping through the arteries is too strong. The arteries are the blood vessels that carry blood from the heart throughout the body. Hypertension forces the heart to work harder to pump blood and may cause arteries to become narrow or stiff. Untreated or uncontrolled hypertension can lead to a heart attack, heart failure, a stroke, kidney disease, and other problems. A blood pressure reading consists of a higher number over a lower number. Ideally, your blood pressure should be below 120/80. The first ("top") number is called the systolic pressure. It is a measure of the pressure in your arteries as your heart beats. The second ("bottom") number is called the diastolic pressure. It is a measure of the pressure in your arteries as the heart relaxes. What are the causes? The exact cause of this condition is not known. There are some conditions that result in high blood pressure. What increases the risk? Certain factors may make you more likely to develop high blood pressure. Some of these risk factors are under your control, including: Smoking. Not getting enough exercise or physical activity. Being overweight. Having too much fat, sugar, calories, or salt (sodium) in your diet. Drinking too much alcohol. Other risk factors include: Having a personal history of heart disease, diabetes, high cholesterol, or kidney disease. Stress. Having a family history of high blood pressure and high cholesterol. Having obstructive sleep apnea. Age. The risk increases with age. What are the signs or symptoms? High blood pressure may not cause symptoms. Very high blood pressure (hypertensive crisis) may cause: Headache. Fast or irregular heartbeats (palpitations). Shortness of breath. Nosebleed. Nausea and vomiting. Vision changes. Severe chest pain, dizziness, and seizures. How is this diagnosed? This condition is diagnosed by  measuring your blood pressure while you are seated, with your arm resting on a flat surface, your legs uncrossed, and your feet flat on the floor. The cuff of the blood pressure monitor will be placed directly against the skin of your upper arm at the level of your heart. Blood pressure should be measured at least twice using the same arm. Certain conditions can cause a difference in blood pressure between your right and left arms. If you have a high blood pressure reading during one visit or you have normal blood pressure with other risk factors, you may be asked to: Return on a different day to have your blood pressure checked again. Monitor your blood pressure at home for 1 week or longer. If you are diagnosed with hypertension, you may have other blood or imaging tests to help your health care provider understand your overall risk for other conditions. How is this treated? This condition is treated by making healthy lifestyle changes, such as eating healthy foods, exercising more, and reducing your alcohol intake. You may be referred for counseling on a healthy diet and physical activity. Your health care provider may prescribe medicine if lifestyle changes are not enough to get your blood pressure under control and if: Your systolic blood pressure is above 130. Your diastolic blood pressure is above 80. Your personal target blood pressure may vary depending on your medical conditions, your age, and other factors. Follow these instructions at home: Eating and drinking  Eat a diet that is high in fiber and potassium, and low in sodium, added sugar, and fat. An example of this eating plan is called the DASH diet. DASH stands for Dietary Approaches to Stop Hypertension. To eat this way: Eat   plenty of fresh fruits and vegetables. Try to fill one half of your plate at each meal with fruits and vegetables. Eat whole grains, such as whole-wheat pasta, brown rice, or whole-grain bread. Fill about one  fourth of your plate with whole grains. Eat or drink low-fat dairy products, such as skim milk or low-fat yogurt. Avoid fatty cuts of meat, processed or cured meats, and poultry with skin. Fill about one fourth of your plate with lean proteins, such as fish, chicken without skin, beans, eggs, or tofu. Avoid pre-made and processed foods. These tend to be higher in sodium, added sugar, and fat. Reduce your daily sodium intake. Many people with hypertension should eat less than 1,500 mg of sodium a day. Do not drink alcohol if: Your health care provider tells you not to drink. You are pregnant, may be pregnant, or are planning to become pregnant. If you drink alcohol: Limit how much you have to: 0-1 drink a day for women. 0-2 drinks a day for men. Know how much alcohol is in your drink. In the [place] one drink equals one 12 oz bottle of beer (355 mL), one 5 oz glass of wine (148 mL), or one 1 oz glass of hard liquor (44 mL). Lifestyle  Work with your health care provider to maintain a healthy body weight or to lose weight. Ask what an ideal weight is for you. Get at least 30 minutes of exercise that causes your heart to beat faster (aerobic exercise) most days of the week. Activities may include walking, swimming, or biking. Include exercise to strengthen your muscles (resistance exercise), such as Pilates or lifting weights, as part of your weekly exercise routine. Try to do these types of exercises for 30 minutes at least 3 days a week. Do not use any products that contain nicotine or tobacco. These products include cigarettes, chewing tobacco, and vaping devices, such as e-cigarettes. If you need help quitting, ask your health care provider. Monitor your blood pressure at home as told by your health care provider. Keep all follow-up visits. This is important. Medicines Take over-the-counter and prescription medicines only as told by your health care provider. Follow directions carefully. Blood  pressure medicines must be taken as prescribed. Do not skip doses of blood pressure medicine. Doing this puts you at risk for problems and can make the medicine less effective. Ask your health care provider about side effects or reactions to medicines that you should watch for. Contact a health care provider if you: Think you are having a reaction to a medicine you are taking. Have headaches that keep coming back (recurring). Feel dizzy. Have swelling in your ankles. Have trouble with your vision. Get help right away if you: Develop a severe headache or confusion. Have unusual weakness or numbness. Feel faint. Have severe pain in your chest or abdomen. Vomit repeatedly. Have trouble breathing. These symptoms may be an emergency. Get help right away. Call 911. Do not wait to see if the symptoms will go away. Do not drive yourself to the hospital. Summary Hypertension is when the force of blood pumping through your arteries is too strong. If this condition is not controlled, it may put you at risk for serious complications. Your personal target blood pressure may vary depending on your medical conditions, your age, and other factors. For most people, a normal blood pressure is less than 120/80. Hypertension is treated with lifestyle changes, medicines, or a combination of both. Lifestyle changes include losing weight, eating a healthy,   low-sodium diet, exercising more, and limiting alcohol. This information is not intended to replace advice given to you by your health care provider. Make sure you discuss any questions you have with your health care provider. Document Revised: 12/07/2020 Document Reviewed: 12/07/2020 Elsevier Patient Education  2024 [employer]vier Inc.  

## 2022-10-03 LAB — CMP14+EGFR
ALT: 44 IU/L (ref 0–44)
AST: 25 IU/L (ref 0–40)
Albumin: 4.5 g/dL (ref 3.8–4.9)
Alkaline Phosphatase: 86 IU/L (ref 44–121)
BUN/Creatinine Ratio: 17 (ref 9–20)
BUN: 16 mg/dL (ref 6–24)
Bilirubin Total: 0.6 mg/dL (ref 0.0–1.2)
CO2: 25 mmol/L (ref 20–29)
Calcium: 9.7 mg/dL (ref 8.7–10.2)
Chloride: 108 mmol/L — ABNORMAL HIGH (ref 96–106)
Creatinine, Ser: 0.93 mg/dL (ref 0.76–1.27)
Globulin, Total: 2 g/dL (ref 1.5–4.5)
Glucose: 83 mg/dL (ref 70–99)
Potassium: 4.2 mmol/L (ref 3.5–5.2)
Sodium: 145 mmol/L — ABNORMAL HIGH (ref 134–144)
Total Protein: 6.5 g/dL (ref 6.0–8.5)
eGFR: 98 mL/min/{1.73_m2} (ref >59)

## 2022-10-03 LAB — CBC WITH DIFFERENTIAL/PLATELET
Basophils Absolute: 0 10*3/uL (ref 0.0–0.2)
Basos: 1 %
EOS (ABSOLUTE): 0.1 10*3/uL (ref 0.0–0.4)
Eos: 2 %
Hematocrit: 41.4 % (ref 37.5–51.0)
Hemoglobin: 13.4 g/dL (ref 13.0–17.7)
Immature Grans (Abs): 0 10*3/uL (ref 0.0–0.1)
Immature Granulocytes: 0 %
Lymphocytes Absolute: 1.4 10*3/uL (ref 0.7–3.1)
Lymphs: 32 %
MCH: 28.5 pg (ref 26.6–33.0)
MCHC: 32.4 g/dL (ref 31.5–35.7)
MCV: 88 fL (ref 79–97)
Monocytes Absolute: 0.3 10*3/uL (ref 0.1–0.9)
Monocytes: 8 %
Neutrophils Absolute: 2.5 10*3/uL (ref 1.4–7.0)
Neutrophils: 57 %
Platelets: 173 10*3/uL (ref 150–450)
RBC: 4.7 x10E6/uL (ref 4.14–5.80)
RDW: 13.1 % (ref 11.6–15.4)
WBC: 4.4 10*3/uL (ref 3.4–10.8)

## 2022-10-03 LAB — LIPID PANEL
Chol/HDL Ratio: 3.5 ratio (ref 0.0–5.0)
Cholesterol, Total: 113 mg/dL (ref 100–199)
HDL: 32 mg/dL — ABNORMAL LOW (ref >39)
LDL Chol Calc (NIH): 66 mg/dL (ref 0–99)
Triglycerides: 75 mg/dL (ref 0–149)
VLDL Cholesterol Cal: 15 mg/dL (ref 5–40)

## 2022-10-03 LAB — HEPATITIS C ANTIBODY: Hep C Virus Ab: NONREACTIVE

## 2022-10-06 DIAGNOSIS — Z23 Encounter for immunization: Secondary | ICD-10-CM | POA: Insufficient documentation

## 2022-10-06 DIAGNOSIS — I69320 Aphasia following cerebral infarction: Secondary | ICD-10-CM | POA: Insufficient documentation

## 2022-10-06 DIAGNOSIS — Z1159 Encounter for screening for other viral diseases: Secondary | ICD-10-CM | POA: Insufficient documentation

## 2022-10-06 DIAGNOSIS — F17200 Nicotine dependence, unspecified, uncomplicated: Secondary | ICD-10-CM | POA: Insufficient documentation

## 2022-10-06 NOTE — Assessment & Plan Note (Signed)
>>  ASSESSMENT AND PLAN FOR HTN (HYPERTENSION) WRITTEN ON 10/06/2022 10:43 AM BY Jarrett Merry, Heidemarie Goodnow, NP  Chronic. Advised to continue monitor BP readings and report extreme spikes to cardiologist

## 2022-10-06 NOTE — Assessment & Plan Note (Signed)
Continue Atorvastatin 80mg  QD

## 2022-10-06 NOTE — Assessment & Plan Note (Signed)
Chronic.Well managed

## 2022-10-06 NOTE — Assessment & Plan Note (Signed)
Chronic. Advised to continue monitor BP readings and report extreme spikes to cardiologist

## 2022-10-17 ENCOUNTER — Ambulatory Visit: Payer: Medicaid Other | Admitting: Physical Medicine & Rehabilitation

## 2022-10-17 ENCOUNTER — Encounter: Payer: Self-pay | Admitting: Physical Medicine & Rehabilitation

## 2022-10-17 ENCOUNTER — Encounter: Payer: Medicaid Other | Attending: Physical Medicine & Rehabilitation | Admitting: Physical Medicine & Rehabilitation

## 2022-10-17 VITALS — BP 131/88 | HR 69 | Ht 74.0 in | Wt 168.0 lb

## 2022-10-17 DIAGNOSIS — G8111 Spastic hemiplegia affecting right dominant side: Secondary | ICD-10-CM | POA: Insufficient documentation

## 2022-10-17 MED ORDER — SODIUM CHLORIDE (PF) 0.9 % IJ SOLN
8.0000 mL | Freq: Once | INTRAMUSCULAR | Status: AC
Start: 2022-10-17 — End: 2022-10-17
  Administered 2022-10-17: 8 mL via INTRAVENOUS

## 2022-10-17 MED ORDER — ONABOTULINUMTOXINA 100 UNITS IJ SOLR
400.0000 [IU] | Freq: Once | INTRAMUSCULAR | Status: AC
  Administered 2022-10-17: 400 [IU] via INTRAMUSCULAR

## 2022-10-17 NOTE — Patient Instructions (Signed)

## 2022-10-17 NOTE — Addendum Note (Signed)
Addended by: Janean Sark on: 10/17/2022 03:14 PM   Modules accepted: Orders

## 2022-10-17 NOTE — Progress Notes (Signed)
Botox Injection for spasticity using needle EMG guidance  Dilution: 50 Units/ml Indication: Severe spasticity which interferes with ADL,mobility and/or  hygiene and is unresponsive to medication management and other conservative care Informed consent was obtained after describing risks and benefits of the procedure with the patient. This includes bleeding, bruising, infection, excessive weakness, or medication side effects. A REMS form is on file and signed. Needle:  needle electrode Number of units per muscle Right Pec 100U Biceps 50 Brachialis 50 Brachiorad 50 FCU 50 FCR 50 FDS 25 FDP 25 All injections were done after obtaining appropriate EMG activity and after negative drawback for blood. The patient tolerated the procedure well. Post procedure instructions were given. A followup appointment was made.

## 2022-12-10 ENCOUNTER — Encounter (HOSPITAL_COMMUNITY): Payer: Self-pay | Admitting: Emergency Medicine

## 2022-12-10 ENCOUNTER — Other Ambulatory Visit: Payer: Self-pay

## 2022-12-10 ENCOUNTER — Emergency Department (HOSPITAL_COMMUNITY): Payer: Medicaid Other

## 2022-12-10 ENCOUNTER — Observation Stay (HOSPITAL_COMMUNITY)
Admission: EM | Admit: 2022-12-10 | Discharge: 2022-12-11 | Disposition: A | Discharge status: Home / Self Care | Payer: Medicaid Other | Attending: Internal Medicine | Admitting: Internal Medicine

## 2022-12-10 DIAGNOSIS — I5020 Unspecified systolic (congestive) heart failure: Secondary | ICD-10-CM | POA: Insufficient documentation

## 2022-12-10 DIAGNOSIS — D696 Thrombocytopenia, unspecified: Secondary | ICD-10-CM | POA: Diagnosis not present

## 2022-12-10 DIAGNOSIS — G40901 Epilepsy, unspecified, not intractable, with status epilepticus: Secondary | ICD-10-CM | POA: Diagnosis not present

## 2022-12-10 DIAGNOSIS — Z9861 Coronary angioplasty status: Secondary | ICD-10-CM | POA: Insufficient documentation

## 2022-12-10 DIAGNOSIS — I2510 Atherosclerotic heart disease of native coronary artery without angina pectoris: Secondary | ICD-10-CM | POA: Insufficient documentation

## 2022-12-10 DIAGNOSIS — I509 Heart failure, unspecified: Secondary | ICD-10-CM

## 2022-12-10 DIAGNOSIS — Z8673 Personal history of transient ischemic attack (TIA), and cerebral infarction without residual deficits: Secondary | ICD-10-CM | POA: Insufficient documentation

## 2022-12-10 DIAGNOSIS — Z79899 Other long term (current) drug therapy: Secondary | ICD-10-CM | POA: Insufficient documentation

## 2022-12-10 DIAGNOSIS — F17210 Nicotine dependence, cigarettes, uncomplicated: Secondary | ICD-10-CM | POA: Diagnosis not present

## 2022-12-10 DIAGNOSIS — I110 Hypertensive heart disease with heart failure: Secondary | ICD-10-CM | POA: Insufficient documentation

## 2022-12-10 DIAGNOSIS — I10 Essential (primary) hypertension: Secondary | ICD-10-CM | POA: Insufficient documentation

## 2022-12-10 DIAGNOSIS — Z7982 Long term (current) use of aspirin: Secondary | ICD-10-CM | POA: Diagnosis not present

## 2022-12-10 DIAGNOSIS — I129 Hypertensive chronic kidney disease with stage 1 through stage 4 chronic kidney disease, or unspecified chronic kidney disease: Secondary | ICD-10-CM

## 2022-12-10 DIAGNOSIS — R569 Unspecified convulsions: Secondary | ICD-10-CM | POA: Diagnosis not present

## 2022-12-10 DIAGNOSIS — E872 Acidosis, unspecified: Secondary | ICD-10-CM | POA: Diagnosis not present

## 2022-12-10 DIAGNOSIS — K219 Gastro-esophageal reflux disease without esophagitis: Secondary | ICD-10-CM | POA: Diagnosis not present

## 2022-12-10 LAB — I-STAT CHEM 8, ED
BUN: 17 mg/dL (ref 6–20)
Calcium, Ion: 1.18 mmol/L (ref 1.15–1.40)
Chloride: 109 mmol/L (ref 98–111)
Creatinine, Ser: 1.1 mg/dL (ref 0.61–1.24)
Glucose, Bld: 108 mg/dL — ABNORMAL HIGH (ref 70–99)
HCT: 43 % (ref 39.0–52.0)
Hemoglobin: 14.6 g/dL (ref 13.0–17.0)
Potassium: 3.6 mmol/L (ref 3.5–5.1)
Sodium: 143 mmol/L (ref 135–145)
TCO2: 18 mmol/L — ABNORMAL LOW (ref 22–32)

## 2022-12-10 LAB — DIFFERENTIAL
Abs Immature Granulocytes: 0.02 10*3/uL (ref 0.00–0.07)
Basophils Absolute: 0 10*3/uL (ref 0.0–0.1)
Basophils Relative: 0 %
Eosinophils Absolute: 0.1 10*3/uL (ref 0.0–0.5)
Eosinophils Relative: 2 %
Immature Granulocytes: 0 %
Lymphocytes Relative: 35 %
Lymphs Abs: 2 10*3/uL (ref 0.7–4.0)
Monocytes Absolute: 0.5 10*3/uL (ref 0.1–1.0)
Monocytes Relative: 9 %
Neutro Abs: 3.1 10*3/uL (ref 1.7–7.7)
Neutrophils Relative %: 54 %

## 2022-12-10 LAB — CBC
HCT: 42.4 % (ref 39.0–52.0)
HCT: 40.9 % (ref 39.0–52.0)
Hemoglobin: 13.3 g/dL (ref 13.0–17.0)
Hemoglobin: 13 g/dL (ref 13.0–17.0)
MCH: 27.9 pg (ref 26.0–34.0)
MCH: 28 pg (ref 26.0–34.0)
MCHC: 31.4 g/dL (ref 30.0–36.0)
MCHC: 31.8 g/dL (ref 30.0–36.0)
MCV: 88.9 fL (ref 80.0–100.0)
MCV: 88 fL (ref 80.0–100.0)
Platelets: 133 10*3/uL — ABNORMAL LOW (ref 150–400)
Platelets: 142 10*3/uL — ABNORMAL LOW (ref 150–400)
RBC: 4.77 MIL/uL (ref 4.22–5.81)
RBC: 4.65 MIL/uL (ref 4.22–5.81)
RDW: 14.1 % (ref 11.5–15.5)
RDW: 14.2 % (ref 11.5–15.5)
WBC: 5.8 10*3/uL (ref 4.0–10.5)
WBC: 7.6 10*3/uL (ref 4.0–10.5)
nRBC: 0 % (ref 0.0–0.2)
nRBC: 0 % (ref 0.0–0.2)

## 2022-12-10 LAB — URINALYSIS, ROUTINE W REFLEX MICROSCOPIC
Bilirubin Urine: NEGATIVE
Glucose, UA: NEGATIVE mg/dL
Hgb urine dipstick: NEGATIVE
Ketones, ur: NEGATIVE mg/dL
Leukocytes,Ua: NEGATIVE
Nitrite: NEGATIVE
Protein, ur: NEGATIVE mg/dL
Specific Gravity, Urine: 1.011 (ref 1.005–1.030)
pH: 7 (ref 5.0–8.0)

## 2022-12-10 LAB — COMPREHENSIVE METABOLIC PANEL
ALT: 37 U/L (ref 0–44)
AST: 30 U/L (ref 15–41)
Albumin: 3.9 g/dL (ref 3.5–5.0)
Alkaline Phosphatase: 67 U/L (ref 38–126)
Anion gap: 16 — ABNORMAL HIGH (ref 5–15)
BUN: 17 mg/dL (ref 6–20)
CO2: 17 mmol/L — ABNORMAL LOW (ref 22–32)
Calcium: 9.2 mg/dL (ref 8.9–10.3)
Chloride: 109 mmol/L (ref 98–111)
Creatinine, Ser: 1.23 mg/dL (ref 0.61–1.24)
GFR, Estimated: >60 mL/min (ref >60)
Glucose, Bld: 117 mg/dL — ABNORMAL HIGH (ref 70–99)
Potassium: 3.6 mmol/L (ref 3.5–5.1)
Sodium: 142 mmol/L (ref 135–145)
Total Bilirubin: 0.7 mg/dL (ref 0.3–1.2)
Total Protein: 6.7 g/dL (ref 6.5–8.1)

## 2022-12-10 LAB — BASIC METABOLIC PANEL
Anion gap: 8 (ref 5–15)
BUN: 15 mg/dL (ref 6–20)
CO2: 25 mmol/L (ref 22–32)
Calcium: 9 mg/dL (ref 8.9–10.3)
Chloride: 108 mmol/L (ref 98–111)
Creatinine, Ser: 1.01 mg/dL (ref 0.61–1.24)
GFR, Estimated: >60 mL/min (ref >60)
Glucose, Bld: 97 mg/dL (ref 70–99)
Potassium: 3.2 mmol/L — ABNORMAL LOW (ref 3.5–5.1)
Sodium: 141 mmol/L (ref 135–145)

## 2022-12-10 LAB — PROTIME-INR
INR: 1 (ref 0.8–1.2)
Prothrombin Time: 13.9 s (ref 11.4–15.2)

## 2022-12-10 LAB — ETHANOL: Alcohol, Ethyl (B): <10 mg/dL (ref <10)

## 2022-12-10 LAB — I-STAT CG4 LACTIC ACID, ED: Lactic Acid, Venous: 8 mmol/L (ref 0.5–1.9)

## 2022-12-10 LAB — RAPID URINE DRUG SCREEN, HOSP PERFORMED
Amphetamines: NOT DETECTED
Barbiturates: NOT DETECTED
Benzodiazepines: POSITIVE — AB
Cocaine: NOT DETECTED
Opiates: NOT DETECTED
Tetrahydrocannabinol: NOT DETECTED

## 2022-12-10 LAB — APTT: aPTT: 26 s (ref 24–36)

## 2022-12-10 LAB — HIV ANTIBODY (ROUTINE TESTING W REFLEX): HIV Screen 4th Generation wRfx: NONREACTIVE

## 2022-12-10 LAB — CBG MONITORING, ED: Glucose-Capillary: 109 mg/dL — ABNORMAL HIGH (ref 70–99)

## 2022-12-10 LAB — LACTIC ACID, PLASMA: Lactic Acid, Venous: 1 mmol/L (ref 0.5–1.9)

## 2022-12-10 MED ORDER — METOPROLOL TARTRATE 25 MG PO TABS
25.0000 mg | ORAL_TABLET | Freq: Two times a day (BID) | ORAL | Status: DC
  Administered 2022-12-10 – 2022-12-11 (×3): 25 mg via ORAL
  Filled 2022-12-10 (×3): qty 1

## 2022-12-10 MED ORDER — ISOSORBIDE DINITRATE 10 MG PO TABS
10.0000 mg | ORAL_TABLET | Freq: Two times a day (BID) | ORAL | Status: DC
  Administered 2022-12-10 – 2022-12-11 (×3): 10 mg via ORAL
  Filled 2022-12-10 (×3): qty 1

## 2022-12-10 MED ORDER — POTASSIUM CHLORIDE CRYS ER 20 MEQ PO TBCR
40.0000 meq | EXTENDED_RELEASE_TABLET | Freq: Once | ORAL | Status: AC
  Administered 2022-12-10: 40 meq via ORAL
  Filled 2022-12-10: qty 2

## 2022-12-10 MED ORDER — LEVETIRACETAM IN NACL 1000 MG/100ML IV SOLN
1000.0000 mg | Freq: Two times a day (BID) | INTRAVENOUS | Status: DC
  Administered 2022-12-10 – 2022-12-11 (×2): 1000 mg via INTRAVENOUS
  Filled 2022-12-10 (×2): qty 100

## 2022-12-10 MED ORDER — AMLODIPINE BESYLATE 5 MG PO TABS
5.0000 mg | ORAL_TABLET | Freq: Every day | ORAL | Status: DC
  Administered 2022-12-10 – 2022-12-11 (×2): 5 mg via ORAL
  Filled 2022-12-10 (×2): qty 1

## 2022-12-10 MED ORDER — LOSARTAN POTASSIUM 50 MG PO TABS
25.0000 mg | ORAL_TABLET | Freq: Every day | ORAL | Status: DC
  Administered 2022-12-10 – 2022-12-11 (×2): 25 mg via ORAL
  Filled 2022-12-10 (×2): qty 1

## 2022-12-10 MED ORDER — LEVETIRACETAM IN NACL 1000 MG/100ML IV SOLN
1000.0000 mg | Freq: Once | INTRAVENOUS | Status: AC
  Administered 2022-12-10: 1000 mg via INTRAVENOUS
  Filled 2022-12-10: qty 100

## 2022-12-10 MED ORDER — ATORVASTATIN CALCIUM 80 MG PO TABS
80.0000 mg | ORAL_TABLET | Freq: Every day | ORAL | Status: DC
  Administered 2022-12-10 – 2022-12-11 (×2): 80 mg via ORAL
  Filled 2022-12-10: qty 2
  Filled 2022-12-10: qty 1

## 2022-12-10 MED ORDER — PANTOPRAZOLE SODIUM 40 MG PO TBEC
40.0000 mg | DELAYED_RELEASE_TABLET | Freq: Every day | ORAL | Status: DC
  Administered 2022-12-10 – 2022-12-11 (×2): 40 mg via ORAL
  Filled 2022-12-10 (×2): qty 1

## 2022-12-10 MED ORDER — ACETAMINOPHEN 325 MG PO TABS: 650.0000 mg | ORAL_TABLET | Freq: Four times a day (QID) | ORAL | Status: DC | PRN

## 2022-12-10 MED ORDER — IOHEXOL 350 MG/ML SOLN
75.0000 mL | Freq: Once | INTRAVENOUS | Status: AC | PRN
  Administered 2022-12-10: 75 mL via INTRAVENOUS

## 2022-12-10 MED ORDER — ASPIRIN 81 MG PO TBEC
81.0000 mg | DELAYED_RELEASE_TABLET | Freq: Every day | ORAL | Status: DC
  Administered 2022-12-10 – 2022-12-11 (×2): 81 mg via ORAL
  Filled 2022-12-10 (×2): qty 1

## 2022-12-10 MED ORDER — SODIUM CHLORIDE 0.9 % IV SOLN
3000.0000 mg | Freq: Once | INTRAVENOUS | Status: AC
  Administered 2022-12-10: 3000 mg via INTRAVENOUS
  Filled 2022-12-10: qty 30

## 2022-12-10 MED ORDER — ACETAMINOPHEN 650 MG RE SUPP: 650.0000 mg | Freq: Four times a day (QID) | RECTAL | Status: DC | PRN

## 2022-12-10 MED ORDER — SODIUM CHLORIDE 0.9 % IV SOLN: INTRAVENOUS | Status: DC | PRN

## 2022-12-10 MED ORDER — ENOXAPARIN SODIUM 40 MG/0.4ML IJ SOSY
40.0000 mg | PREFILLED_SYRINGE | INTRAMUSCULAR | Status: DC
  Administered 2022-12-10: 40 mg via SUBCUTANEOUS
  Filled 2022-12-10: qty 0.4

## 2022-12-10 NOTE — Progress Notes (Signed)
The patient was admitted by my colleague Dr. Loney Loh earlier today.   The patient is resting comfortably. His wife is at bedside. She states that he is close to his baseline mental status, but not quite there. He is currently being prepped for the EEG. Neurology has been consulted. His vitals are within normal limits at the time of my visit, although his blood pressure was quite elevated earlier. Neurology has been consulted. Results of EEG are pending.

## 2022-12-10 NOTE — Procedures (Signed)
Patient Name: Henry Simmons  MRN: [id number]  Epilepsy Attending: Charlsie Quest  Referring Physician/Provider: Erick Blinks, MD  Duration: 12/10/2022 0500 to 12/11/2022 0547  Patient history: 52 y.o. 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 EEG to evaluate for seizure  Level of alertness: Awake  AEDs during EEG study: LEV  Technical aspects: This EEG study was done with scalp electrodes positioned according to the 10-20 International system of electrode placement. Electrical activity was reviewed with band pass filter of 1-70Hz , sensitivity of 7 uV/mm, display speed of 60mm/sec with a 60Hz  notched filter applied as appropriate. EEG data were recorded continuously and digitally stored.  Video monitoring was available and reviewed as appropriate.  Description: EEG initially showed continuous low amplitude 3 to 5 Hz theta-delta slowing in left hemisphere as well as 5-7hz  theta slowing in right hemisphere. Gradually EEG improved and showed posterior dominant rhythm of 8-9 Hz activity of moderate voltage (25-35 uV) seen predominantly in posterior head regions, asymmetric ( left<right) and reactive to eye opening and eye closing.  Sleep was characterized by sleep symptoms (12 to 14 Hz), maximal frontocentral region.  EEG showed intermittent generalized 2-5 Hz theta- delta slowing as well as continuous low amplitude 3 to 5 Hz 11 delta slowing left hemisphere.  Hyperventilation and photic stimulation were not performed.     EEG was disconnected between 12/10/2022 1519 to 1625 for maintenance of electrodes. Patient pulled electrodes and EEG was not interpretable between 12/10/2022 2109 to 12/11/2022 0052. Patient again pulled electrodes and EEG was not interpretable after 12/11/2022 0547   ABNORMALITY - Continuous slow, generalized  and lateralized left hemisphere  -Background asymmetry,  left<right  IMPRESSION: This technically difficult study is suggestive of cortical dysfunction arising from  left hemisphere likely secondary to underlying stroke.Additionally there is mild moderate diffuse encephalopathy. No seizures or epileptiform discharges were seen throughout the recording.  Shannah Conteh Annabelle Harman
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
## 2022-12-10 NOTE — ED Notes (Signed)
Charge nurse wants pt moved to green. Pt on EEG. EEG called for assistance with moving pt. No answer.

## 2022-12-10 NOTE — Progress Notes (Signed)
LTM EEG running - no initial skin breakdown - push button tested - neuro notified.  

## 2022-12-10 NOTE — Progress Notes (Signed)
[facility] NOT monitoring, HU charge confirmed

## 2022-12-10 NOTE — Progress Notes (Signed)
Upon entering patient room, patient had all EEG leads off his head. Called EEG tech x 2, call not answered. Left Voice message.

## 2022-12-10 NOTE — Progress Notes (Signed)
LTM maint complete - no skin breakdown under:  C3,F8,P7

## 2022-12-10 NOTE — ED Notes (Signed)
Pt arrived back into ED from CT.

## 2022-12-10 NOTE — ED Notes (Signed)
Patient transported to CT 

## 2022-12-10 NOTE — ED Notes (Signed)
Seizure pads applied to stretcher

## 2022-12-10 NOTE — ED Notes (Signed)
PT well appearing upon transport to IP floor. EEG paged to transport EEG machine upstairs.

## 2022-12-10 NOTE — ED Provider Notes (Addendum)
[place] EMERGENCY DEPARTMENT AT [facility] Provider Note   CSN: [id number] Arrival date & time: 12/10/22  7829  An emergency department physician performed an initial assessment on this suspected stroke patient at 0346.  History  Chief Complaint  Patient presents with   Code Stroke    Arrived from home via EMS. LKW 1000 lastnight. Woke up at 0300 this morning screaming and then became unresponsive. Hx of seizures. Non compliant with meds. Had a seizure in route with EMS. 2.5 mg of versed administered. Hx of CVA with right sided deficit.    Henry Simmons is a 52 y.o. male.  Patient presents to the emergency department by ambulance from home as a possible stroke.  Patient does have a history of prior stroke with right-sided deficit.  Patient woke up at 3 AM screaming, had gone to bed at his normal baseline.  He reportedly had a seizure.  EMS has administered 2.5 mg of Versed IV for seizure, at arrival he has not awake or answering questions.       Home Medications Prior to Admission medications   Medication Sig Start Date End Date Taking? Authorizing Provider  levETIRAcetam (KEPPRA) 500 MG tablet Take 500 mg by mouth 2 (two) times daily. 10/17/22  Yes [provider]  acetaminophen (TYLENOL) 325 MG tablet Take 2 tablets (650 mg total) by mouth every 4 (four) hours as needed for mild pain (or temp > 37.5 C (99.5 F)). 06/14/21   Orpah Cobb, MD  amLODipine (NORVASC) 5 MG tablet Take 1 tablet every day by oral route. 12/27/19   [provider]  Ascorbic Acid (VITAMIN C PO)     [provider]  aspirin 81 MG EC tablet Take 1 tablet (81 mg total) by mouth daily. Swallow whole. 12/02/20   Corky Crafts, MD  atorvastatin (LIPITOR) 80 MG tablet Take 1 tablet (80 mg total) by mouth daily. 07/06/21   Angiulli, Mcarthur Rossetti, PA-C  Ferrous Sulfate (IRON PO)     [provider]  isosorbide dinitrate (ISORDIL) 10 MG tablet Take 1 tablet (10 mg  total) by mouth 2 (two) times daily. 07/06/21   Angiulli, Mcarthur Rossetti, PA-C  losartan (COZAAR) 25 MG tablet Take 25 mg by mouth daily. 08/21/22   [provider]  metoprolol tartrate (LOPRESSOR) 25 MG tablet Take 0.5 tablets (12.5 mg total) by mouth 2 (two) times daily. Patient taking differently: Take 25 mg by mouth 2 (two) times daily. 07/06/21   Angiulli, Mcarthur Rossetti, PA-C  nitroGLYCERIN (NITROSTAT) 0.4 MG SL tablet Place 1 tablet (0.4 mg total) under the tongue every 5 (five) minutes as needed for chest pain. 07/06/21   Angiulli, Mcarthur Rossetti, PA-C  pantoprazole (PROTONIX) 40 MG tablet Take 1 tablet (40 mg total) by mouth daily. 07/06/21   Angiulli, Mcarthur Rossetti, PA-C  Sodium Chloride Flush (NORMAL SALINE FLUSH) 0.9 % SOLN Inject 1000 mL as needed by intravenous route for 1 day. 11/30/20   [provider]  tiZANidine (ZANAFLEX) 2 MG tablet Take 2 mg by mouth at bedtime. 10/18/21   [provider]  vitamin C (ASCORBIC ACID) 500 MG tablet Take 1 tablet (500 mg total) by mouth daily. 07/06/21   Angiulli, Mcarthur Rossetti, PA-C      Allergies    Patient has no known allergies.    Review of Systems   Review of Systems  Physical Exam Updated Vital Signs BP (!) 164/111   Pulse 97   Temp 97.9 F (36.6  C) (Axillary)   Resp 18   Ht 6\' 2"  (1.88 m)   Wt 76 kg   SpO2 100%   BMI 21.51 kg/m  Physical Exam Vitals and nursing note reviewed.  Constitutional:      General: He is not in acute distress.    Appearance: He is well-developed.  HENT:     Head: Normocephalic and atraumatic.     Mouth/Throat:     Mouth: Mucous membranes are moist.  Eyes:     Comments: Rightward gaze  Cardiovascular:     Rate and Rhythm: Normal rate and regular rhythm.     Pulses: Normal pulses.     Heart sounds: Normal heart sounds, S1 normal and S2 normal. No murmur heard.    No friction rub. No gallop.  Pulmonary:     Effort: Pulmonary effort is normal. No respiratory distress.     Breath sounds: Normal breath  sounds.  Abdominal:     Palpations: Abdomen is soft.     Tenderness: There is no abdominal tenderness. There is no guarding or rebound.     Hernia: No hernia is present.  Musculoskeletal:        General: No swelling.     Cervical back: Full passive range of motion without pain, normal range of motion and neck supple. No pain with movement, spinous process tenderness or muscular tenderness. Normal range of motion.     Right lower leg: No edema.     Left lower leg: No edema.  Skin:    General: Skin is warm and dry.     Capillary Refill: Capillary refill takes less than 2 seconds.     Findings: No ecchymosis, erythema, lesion or wound.  Neurological:     Motor: Weakness and abnormal muscle tone (right side) present.  Psychiatric:        Speech: He is noncommunicative.     ED Results / Procedures / Treatments   Labs (all labs ordered are listed, but only abnormal results are displayed) Labs Reviewed  CBC - Abnormal; Notable for the following components:      Result Value   Platelets 133 (*)    All other components within normal limits  COMPREHENSIVE METABOLIC PANEL - Abnormal; Notable for the following components:   CO2 17 (*)    Glucose, Bld 117 (*)    Anion gap 16 (*)    All other components within normal limits  I-STAT CHEM 8, ED - Abnormal; Notable for the following components:   Glucose, Bld 108 (*)    TCO2 18 (*)    All other components within normal limits  CBG MONITORING, ED - Abnormal; Notable for the following components:   Glucose-Capillary 109 (*)    All other components within normal limits  I-STAT CG4 LACTIC ACID, ED - Abnormal; Notable for the following components:   Lactic Acid, Venous 8.0 (*)    All other components within normal limits  ETHANOL  PROTIME-INR  APTT  DIFFERENTIAL  RAPID URINE DRUG SCREEN, HOSP PERFORMED  URINALYSIS, ROUTINE W REFLEX MICROSCOPIC    EKG EKG Interpretation Date/Time:  Sunday December 10 2022 04:21:55 EDT Ventricular Rate:   101 PR Interval:  167 QRS Duration:  117 QT Interval:  376 QTC Calculation: 488 R Axis:   84  Text Interpretation: Sinus tachycardia Ventricular premature complex Nonspecific intraventricular conduction delay Inferolateral infarct, old No acute changes Confirmed by Gilda Crease [phone]) on 12/10/2022 5:08:59 AM  Radiology CT ANGIO HEAD NECK W WO CM (  CODE STROKE)  Result Date: 12/10/2022 CLINICAL DATA:  52 year old male code stroke versus seizure presentation. Known intracranial stenoses; severe at the proximal basilar artery, moderate to severe anterior and posterior circulation branches. EXAM: CT ANGIOGRAPHY HEAD AND NECK WITH AND WITHOUT CONTRAST TECHNIQUE: Multidetector CT imaging of the head and neck was performed using the standard protocol during bolus administration of intravenous contrast. Multiplanar CT image reconstructions and MIPs were obtained to evaluate the vascular anatomy. Carotid stenosis measurements (when applicable) are obtained utilizing NASCET criteria, using the distal internal carotid diameter as the denominator. RADIATION DOSE REDUCTION: This exam was performed according to the departmental dose-optimization program which includes automated exposure control, adjustment of the mA and/or kV according to patient size and/or use of iterative reconstruction technique. CONTRAST:  75mL OMNIPAQUE IOHEXOL 350 MG/ML SOLN COMPARISON:  Prior CTA head and neck 06/07/2021. FINDINGS: CTA NECK Skeleton: No acute osseous abnormality identified. Multilevel cervical spine disc and endplate degeneration. Upper chest: Negative. Other neck: Negative. Aortic arch: 3 vessel arch.  Minimal arch atherosclerosis. Right carotid system: Brachiocephalic artery and right CCA origin are patent with no significant plaque. Tortuous proximal right CCA. Patent right carotid bifurcation without plaque. Tortuous, partially retropharyngeal cervical right ICA with no plaque or stenosis. Left carotid system:  Proximal left CCA mildly obscured by dense innominate vein contrast streak artifact but otherwise appears normal. Mildly tortuous left CCA. Patent left carotid bifurcation. Minimal proximal left ICA plaque. Tortuous left ICA without stenosis to the skull base. Vertebral arteries: Mildly tortuous proximal right subclavian artery without plaque or stenosis. Soft plaque at the right vertebral artery origin resulting in moderate stenosis seen on series 8, image 100. The vessel remains patent, is non dominant. Intermittent right V2 segment irregularity and stenosis is also moderate. But the vessel remains patent to the skull base. Proximal left subclavian artery and left vertebral artery origin are normal. Dominant left vertebral artery is patent to the skull base with no significant stenosis. CTA HEAD Posterior circulation: Dominant left vertebral V4 segment is patent and primarily supplies the basilar. The distal right vertebral artery is severely stenotic or occluded at the vertebrobasilar junction (series 5, image 133). The proximal basilar artery is moderately irregular (series 12, image 24), but the basilar artery is patent with no high-grade stenosis. A ICAs appear dominant and patent. SCA and PCA origins are patent. Right PCA branches are patent with mild to moderate irregularity in most segments. There is moderate to severe distal left PCA branch irregularity and attenuation as seen on series 10, image 29, but no occlusion identified. Anterior circulation: Both ICA siphons are patent. Irregular left ICA siphon including beaded outpouching which is probably atherosclerotic pseudo lesion series 9, image 126. Mild to moderate left siphon stenosis. Furthermore, there is a slightly saccular dilatation of the distal left ICA is seen on series 9, image 124, but this does not rise to the level of the screened aneurysm at this time. Contralateral right ICA siphon is patent with calcified plaque but only mild stenosis.  Patent carotid termini. Patent MCA origins with mild stenosis on the right. Severe stenosis or near occlusion of the left ACA origin and A1 segment which is reconstituted (series 12, image 31). Anterior communicating artery is patent. Left ACA A2 segment is severely stenotic but reconstituted (series 5, image 95). Right ACA A2 is mildly irregular and patent. Left MCA M1 segment and bifurcation are patent with mild irregularity and stenosis. No left MCA branch occlusion is identified. Branches are mildly irregular throughout.  Right MCA M1 segment and bifurcation are patent with mild stenosis. No right MCA branch occlusion is identified but there are severe M2 and M3 branch stenoses as seen on series 10, image 16. Venous sinuses: Early contrast timing, not well evaluated. Anatomic variants: Dominant left vertebral artery. Review of the MIP images confirms the above findings IMPRESSION: 1. Positive for Severe Intracranial Atherosclerosis including Severe Stenoses, Near Occlusions of: - Right vertebral V4, vertebrobasilar junction. - Left ACA A1, and also A2. - Right MCA M2 and M3 branches. - distal Left PCA. 2. Moderate stenoses: - cervical Right vertebral artery. - proximal Basilar artery. 3. 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 4. No extracranial carotid stenosis. Salient findings discussed by telephone with Dr. Erick Blinks on 12/10/2022 at 04:47 . Electronically Signed   By: Odessa Fleming M.D.   On: 12/10/2022 04:49   CT HEAD CODE STROKE WO CONTRAST  Result Date: 12/10/2022 CLINICAL DATA:  Code stroke. 52 year old male. Known intracranial stenosis. EXAM: CT HEAD WITHOUT CONTRAST TECHNIQUE: Contiguous axial images were obtained from the base of the skull through the vertex without intravenous contrast. RADIATION DOSE REDUCTION: This exam was performed according to the departmental dose-optimization program which includes automated exposure control, adjustment of the mA and/or kV  according to patient size and/or use of iterative reconstruction technique. COMPARISON:  Head CT 05/31/2022. FINDINGS: Brain: Sizable chronic left MCA territory infarct with encephalomalacia. Chronic posterior right MCA, posterior perirolandic area of small encephalomalacia in the right hemisphere. Superimposed Patchy and confluent bilateral cerebral white matter hypodensity. No midline shift, mass effect, or evidence of intracranial mass lesion. No ventriculomegaly. No acute intracranial hemorrhage identified. No cortically based acute infarct identified. Deep gray matter small vessel disease related heterogeneity appears stable. Vascular: Questionable faint hyperdensity of a right MCA branch in the sylvian fissure on series 2, image 20, no other suspicious hyperdensity. Calcified atherosclerosis at the skull base. Skull: Stable and intact. Sinuses/Orbits: Visualized paranasal sinuses and mastoids are stable and well aerated. Other: Rightward gaze.  Scalp soft tissues appear stable. ASPECTS [facility] Stroke Program Early CT Score) Total score (0-10 with 10 being normal): 10, multifocal chronic encephalomalacia. CT already red. Starting CTA momentarily IMPRESSION: 1. Advanced chronic ischemic disease. ASPECTS 10. No acute intracranial hemorrhage. 2. Rightward gaze. Questionable faint hyperdensity of a right MCA branch in the Sylvian fissure. 3. Study discussed by telephone with Dr. Derry Lory on 12/10/2022 at 04:05, who advises that based on clinical presentation sequelae of seizure may be more likely. CTA pending. Electronically Signed   By: Odessa Fleming M.D.   On: 12/10/2022 04:07    Procedures Procedures    Medications Ordered in ED Medications  levETIRAcetam (KEPPRA) 3,000 mg in sodium chloride 0.9 % 250 mL IVPB (3,000 mg Intravenous New Bag/Given 12/10/22 0418)  levETIRAcetam (KEPPRA) IVPB 1000 mg/100 mL premix (0 mg Intravenous Stopped 12/10/22 0420)  iohexol (OMNIPAQUE) 350 MG/ML injection 75 mL (75 mLs  Intravenous Contrast Given 12/10/22 0414)    ED Course/ Medical Decision Making/ A&P                                 Medical Decision Making Amount and/or Complexity of Data Reviewed Labs: ordered. Radiology: ordered.  Risk Prescription drug management. Decision regarding hospitalization.   Differential diagnosis considered includes, but not limited to: TIA; Stroke; ICH; Seizure; electrolyte abnormality; hypoglycemia; toxic/pharmacologic causes; CNS infection; psychiatric disorder  Brought to the emergency department  from home by ambulance as a code stroke.  Patient with prior history of stroke, was noted to cry out in his sleep around 3 AM by his wife.  He was not acting like himself, became rigid and shaking.  He does have a history of seizures.  He is likely noncompliant with his Keppra.  Patient nonfocal at arrival, does have somewhat of a rightward gaze.  This raises concern for continued seizure.  Workup and disposition per Dr. Derry Lory, neurology.  CT head, CT angiography without acute changes.  Patient dosed with Keppra and will require hospitalization, continuous EEG monitoring.  Lab work is unremarkable other than lactic acidosis which is secondary to seizure.  No concern for sepsis in this patient.  CRITICAL CARE Performed by: Gilda Crease   Total critical care time: 30 minutes  Critical care time was exclusive of separately billable procedures and treating other patients.  Critical care was necessary to treat or prevent imminent or life-threatening deterioration.  Critical care was time spent personally by me on the following activities: development of treatment plan with patient and/or surrogate as well as nursing, discussions with consultants, evaluation of patient's response to treatment, examination of patient, obtaining history from patient or surrogate, ordering and performing treatments and interventions, ordering and review of laboratory studies,  ordering and review of radiographic studies, pulse oximetry and re-evaluation of patient's condition.         Final Clinical Impression(s) / ED Diagnoses Final diagnoses:  Seizure [facility])    Rx / DC Orders ED Discharge Orders     None         Maron Stanzione, Canary Brim, MD 12/10/22 0441    Gilda Crease, MD 12/10/22 1610    Gilda Crease, MD 12/10/22 [phone]

## 2022-12-10 NOTE — Code Documentation (Addendum)
Responded to Code Stroke called at [phone] for AMS and R sided gaze, [id number]. Pt arrived at 0346. Per EMS, pt had seizure en route and was given 2.5mg  versed. CBG-109, NIH-33, CT head negative for acute changes, CTA-no LVO. TNK not given-outside window, seizure. Pt given 4g keppra after CT scan. Plan EEG. Please complete VS/neuro checks q2h x 12, then q4h.

## 2022-12-11 ENCOUNTER — Other Ambulatory Visit (HOSPITAL_COMMUNITY): Payer: Self-pay

## 2022-12-11 DIAGNOSIS — I10 Essential (primary) hypertension: Secondary | ICD-10-CM | POA: Diagnosis not present

## 2022-12-11 DIAGNOSIS — I5022 Chronic systolic (congestive) heart failure: Secondary | ICD-10-CM

## 2022-12-11 DIAGNOSIS — I2510 Atherosclerotic heart disease of native coronary artery without angina pectoris: Secondary | ICD-10-CM | POA: Diagnosis not present

## 2022-12-11 DIAGNOSIS — G40901 Epilepsy, unspecified, not intractable, with status epilepticus: Secondary | ICD-10-CM | POA: Diagnosis not present

## 2022-12-11 MED ORDER — LEVETIRACETAM 500 MG PO TABS: 500.0000 mg | ORAL_TABLET | Freq: Two times a day (BID) | ORAL | Status: DC

## 2022-12-11 MED ORDER — LEVETIRACETAM 500 MG PO TABS
500.0000 mg | ORAL_TABLET | Freq: Two times a day (BID) | ORAL | 3 refills | Status: DC
  Filled 2022-12-11: qty 60, 30d supply, fill #0

## 2022-12-11 NOTE — Progress Notes (Signed)
[facility] called to inform EEG tech that patient had removed all leads. Tech rehooked patient with CT compatible leads.

## 2022-12-11 NOTE — Progress Notes (Signed)
Patient pulled off his EEG lead again. EEG tech notified by voicemail

## 2022-12-11 NOTE — Progress Notes (Signed)
OT Cancellation Note  Patient Details Name: Henry Simmons MRN: [id number] DOB: [birth year]   Cancelled Treatment:    Reason Eval/Treat Not Completed: OT screened, no needs identified, will sign off OT evaluation order received this morning, with discharge paperwork subsequently following. OT touching base with patient, who states he is back to his baseline, has all the equipment and support he needs at home, and has no concerns or acute needs. OT will screen at this time. Please re-consult if further acute needs arise.   Pollyann Glen E. Yurem Viner, OTR/L Acute Rehabilitation Services [phone]   Cherlyn Cushing 12/11/2022, 11:39 AM

## 2022-12-11 NOTE — TOC Transition Note (Signed)
Transition of Care [facility]) - CM/SW Discharge Note   Patient Details  Name: Henry Simmons MRN: [id number] Date of Birth: February 28, 1970  [facility] CM/SW Contact:  Gordy Clement, RN Phone Number: 12/11/2022, 11:38 AM   Clinical Narrative:     Patient to DC to home today  No TOC needs identified.  Family to transport home after DC Rx's  received in room           Patient Goals and CMS Choice      Discharge Placement                         Discharge Plan and Services Additional resources added to the After Visit Summary for                                       Social Determinants of Health (SDOH) Interventions SDOH Screenings   Depression (PHQ2-9): Low Risk  (10/17/2022)  Tobacco Use: High Risk (12/10/2022)     Readmission Risk Interventions     No data to display
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
## 2022-12-11 NOTE — Plan of Care (Signed)
  Problem: Pain Management: Goal: General experience of comfort will improve Outcome: Progressing   Problem: Health Behavior/Discharge Planning: Goal: Compliance with prescribed medication regimen will improve Outcome: Progressing   Problem: Clinical Measurements: Goal: Diagnostic test results will improve Outcome: Progressing

## 2022-12-11 NOTE — Progress Notes (Signed)
LTM EEG disconnected - no skin breakdown at unhook.  

## 2022-12-11 NOTE — Discharge Summary (Signed)
PATIENT DETAILS Name: Henry Simmons Age: 52 y.o. Sex: male Date of Birth: [birth year] MRN: [id number]. Admitting Physician: John Giovanni, MD WUJ:[id number], Dennie Bible, NP  Admit Date: 12/10/2022 Discharge date: 12/11/2022  Recommendations for Outpatient Follow-up:  Follow up with PCP in 1-2 weeks Please obtain CMP/CBC in one week  Admitted From:  Home  Disposition: Home   Discharge Condition: good  CODE STATUS:   Code Status: Full Code   Diet recommendation:  Diet Order             Diet - low sodium heart healthy           Diet Heart Room service appropriate? 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  Lactic acidosis No indication of sepsis-this was from seizures.  CAD s/p PCI No anginal symptoms Continue ASA/metoprolol/Imdur/Lipitor  Chronic HFrEF Euvolemic  Prior CVA-right-sided hemiparesis Aspirin/Lipitor  HTN Continue amlodipine/losartan/metoprolol/Imdur-Per spouse-they have all of these medications at home.  BMI: Estimated body mass index is 21.51 kg/m as calculated from the following:   Height as of this encounter: 6\' 2"  (1.88 m).   Weight as of this encounter: 76 kg.     Discharge Diagnoses:  Principal Problem:   Status epilepticus (HCC) Active Problems:   HTN (hypertension)   Lactic acidosis   Thrombocytopenia (HCC)   CAD (coronary artery disease)   Chronic heart failure [facility])   Discharge Instructions:  Activity:  As tolerated with Full fall precautions use walker/cane &  assistance as needed  Discharge Instructions     Diet - low sodium heart healthy   Complete by: As directed    Discharge instructions   Complete by: As directed    Follow with Primary MD  Ellender Hose, NP in 1-2 weeks  Please get a complete blood count and chemistry panel checked by your Primary MD at your next visit, and again as instructed by your Primary MD.  Get Medicines reviewed and adjusted: Please take all your medications with you for your next visit with your Primary MD  Laboratory/radiological data: Please request your Primary MD to go over all hospital tests and procedure/radiological results at the follow up, please ask your Primary MD to get all Hospital records sent to his/her office.  In some cases, they will be blood work, cultures and biopsy results pending at the time of your discharge. Please request that your primary care M.D. follows up on these results.  Also Note the following: If you experience worsening of your admission symptoms, develop shortness of breath, life threatening emergency, suicidal or homicidal thoughts you must seek medical attention immediately by calling 911 or calling your MD immediately  if symptoms less severe.  You must read complete instructions/literature along with all the possible adverse reactions/side effects for all the Medicines you take and that have been prescribed to you. Take any new Medicines after you have completely understood and accpet all the possible adverse reactions/side effects.   Do not drive when taking Pain medications or sleeping medications (Benzodaizepines)  Do not take more than prescribed Pain, Sleep and Anxiety Medications.  It is not advisable to combine anxiety,sleep and pain medications without talking with your primary care practitioner  Special Instructions: If you have smoked or chewed Tobacco  in the last 2 yrs please stop smoking, stop any regular Alcohol  and or any Recreational drug use.  Wear Seat  belts while driving.  Please note: You were cared for by a hospitalist during your hospital stay. Once you are discharged, your primary care physician will handle any further medical issues. Please note that NO REFILLS for any discharge medications will be authorized once you are discharged, as it is imperative that you return to your primary care physician (or establish a relationship with a primary care physician if you do not have one) for your post hospital discharge needs so that they can reassess your need for medications and monitor your lab values.     Seizure precautions: Per [facility] statutes, patients with seizures are not allowed to drive until they have been seizure-free for six months and cleared by a physician    Use caution when using heavy equipment or power tools. Avoid working on ladders or at heights. Take showers instead of baths. Ensure the water temperature is not too high on the home water heater. Do not go swimming alone. Do not lock yourself in a room alone (i.e. bathroom). When caring for infants or small children, sit down when holding, feeding, or changing them to minimize risk of injury to the child in the event you have a seizure. Maintain good sleep hygiene. Avoid alcohol.    If patient has another seizure, call 911 and bring them back to the ED if: A.  The seizure lasts longer than 5 minutes.      B.  The patient doesn't wake shortly after the seizure or has new problems such as difficulty seeing, speaking or moving following the seizure C.  The patient was injured during the seizure D.  The patient has a temperature over 102 F (39C) E.  The patient vomited during the seizure and now is having trouble breathing    During the Seizure   - First, ensure adequate ventilation and place patients on the floor on their left side  Loosen clothing around the neck and ensure the airway is patent. If the patient is clenching the teeth, do not force the mouth open  with any object as this can cause severe damage - Remove all items from the surrounding that can be hazardous. The patient may be oblivious to what's happening and may not even know what he or she is doing. If the patient is confused and wandering, either gently guide him/her away and block access to outside areas - Reassure the individual and be comforting - Call 911. In most cases, the seizure ends before EMS arrives. However, there are cases when seizures may last over 3 to 5 minutes. Or the individual may have developed breathing difficulties or severe injuries. If a pregnant patient or a person with diabetes develops a seizure, it is prudent to call an ambulance. - Finally, if the patient does not regain full consciousness, then call EMS. Most patients will remain confused for about 45 to 90 minutes after a seizure, so you must use judgment in calling for help. - Avoid restraints but make sure the patient is in a bed with padded side rails - Place the individual in a lateral position with the neck slightly flexed; this will help the saliva drain from the mouth and prevent the  tongue from falling backward - Remove all nearby furniture and other hazards from the area - Provide verbal assurance as the individual is regaining consciousness - Provide the patient with privacy if possible - Call for help and start treatment as ordered by the caregiver    After the Seizure (Postictal Stage)   After a seizure, most patients experience confusion, fatigue, muscle pain and/or a headache. Thus, one should permit the individual to sleep. For the next few days, reassurance is essential. Being calm and helping reorient the person is also of importance.   Most seizures are painless and end spontaneously. Seizures are not harmful to others but can lead to complications such as stress on the lungs, brain and the heart. Individuals with prior lung problems may develop labored breathing and respiratory distress.     Increase activity slowly   Complete by: As directed       Allergies as of 12/11/2022   No Known Allergies      Medication List     TAKE these medications    acetaminophen 325 MG tablet Commonly known as: TYLENOL Take 2 tablets (650 mg total) by mouth every 4 (four) hours as needed for mild pain (or temp > 37.5 C (99.5 F)).   amLODipine 5 MG tablet Commonly known as: NORVASC Take 1 tablet every day by oral route.   Aspirin Low Dose 81 MG tablet Generic drug: aspirin EC Take 1 tablet (81 mg total) by mouth daily. Swallow whole.   atorvastatin 80 MG tablet Commonly known as: LIPITOR Take 1 tablet (80 mg total) by mouth daily.   IRON PO Take 65 mg by mouth daily.   isosorbide dinitrate 10 MG tablet Commonly known as: ISORDIL Take 1 tablet (10 mg total) by mouth 2 (two) times daily.   levETIRAcetam 500 MG tablet Commonly known as: KEPPRA Take 1 tablet (500 mg total) by mouth 2 (two) times daily. Please supply in adherent pouches What changed: additional instructions   losartan 25 MG tablet Commonly known as: COZAAR Take 25 mg by mouth daily.   metoprolol tartrate 25 MG tablet Commonly known as: LOPRESSOR Take 0.5 tablets (12.5 mg total) by mouth 2 (two) times daily. What changed: how much to take   nitroGLYCERIN 0.4 MG SL tablet Commonly known as: NITROSTAT Place 1 tablet (0.4 mg total) under the tongue every 5 (five) minutes as needed for chest pain.   pantoprazole 40 MG tablet Commonly known as: PROTONIX Take 1 tablet (40 mg total) by mouth daily.   VITAMIN C PO Take 1,000 mg by mouth daily.        Follow-up Information     Ellender Hose, NP. Schedule an appointment as soon as possible for a visit in 1 week(s).   Specialty: Family Medicine Contact information: [address] [name] [address] [phone]                No Known Allergies   Other Procedures/Studies: Overnight EEG with video  Result Date:  12/10/2022 Charlsie Quest, MD     12/11/2022  9:52 AM Patient Name: Henry Simmons MRN: [id number] Epilepsy Attending: Charlsie Quest Referring Physician/Provider: Erick Blinks, MD Duration: 12/10/2022 0500 to 12/11/2022 0547 Patient history: 52 y.o. 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 EEG to evaluate for seizure Level of alertness: Awake AEDs during EEG study: LEV Technical aspects: This EEG study  was done with scalp electrodes positioned according to the 10-20 International system of electrode placement. Electrical activity was reviewed with band pass filter of 1-70Hz , sensitivity of 7 uV/mm, display speed of 72mm/sec with a 60Hz  notched filter applied as appropriate. EEG data were recorded continuously and digitally stored.  Video monitoring was available and reviewed as appropriate. Description: EEG initially showed continuous low amplitude 3 to 5 Hz theta-delta slowing in left hemisphere as well as 5-7hz  theta slowing in right hemisphere. Gradually EEG improved and showed posterior dominant rhythm of 8-9 Hz activity of moderate voltage (25-35 uV) seen predominantly in posterior head regions, asymmetric ( left<right) and reactive to eye opening and eye closing.  Sleep was characterized by sleep symptoms (12 to 14 Hz), maximal frontocentral region.  EEG showed intermittent generalized 2-5 Hz theta- delta slowing as well as continuous low amplitude 3 to 5 Hz 11 delta slowing left hemisphere.  Hyperventilation and photic stimulation were not performed.   EEG was disconnected between 12/10/2022 1519 to 1625 for maintenance of electrodes. Patient pulled electrodes and EEG was not interpretable between 12/10/2022 2109 to 12/11/2022 0052. Patient again pulled electrodes and EEG was not interpretable after 12/11/2022 0547 ABNORMALITY - Continuous slow, generalized  and lateralized left hemisphere -Background  asymmetry, left<right IMPRESSION: This technically difficult study is suggestive of cortical dysfunction arising from  left hemisphere likely secondary to underlying stroke.Additionally there is mild moderate diffuse encephalopathy. No seizures or epileptiform discharges were seen throughout the recording. Priyanka Annabelle Harman   CT ANGIO HEAD NECK W WO CM (CODE STROKE)  Result Date: 12/10/2022 CLINICAL DATA:  52 year old male code stroke versus seizure presentation. Known intracranial stenoses; severe at the proximal basilar artery, moderate to severe anterior and posterior circulation branches. EXAM: CT ANGIOGRAPHY HEAD AND NECK WITH AND WITHOUT CONTRAST TECHNIQUE: Multidetector CT imaging of the head and neck was performed using the standard protocol during bolus administration of intravenous contrast. Multiplanar CT image reconstructions and MIPs were obtained to evaluate the vascular anatomy. Carotid stenosis measurements (when applicable) are obtained utilizing NASCET criteria, using the distal internal carotid diameter as the denominator. RADIATION DOSE REDUCTION: This exam was performed according to the departmental dose-optimization program which includes automated exposure control, adjustment of the mA and/or kV according to patient size and/or use of iterative reconstruction technique. CONTRAST:  75mL OMNIPAQUE IOHEXOL 350 MG/ML SOLN COMPARISON:  Prior CTA head and neck 06/07/2021. FINDINGS: CTA NECK Skeleton: No acute osseous abnormality identified. Multilevel cervical spine disc and endplate degeneration. Upper chest: Negative. Other neck: Negative. Aortic arch: 3 vessel arch.  Minimal arch atherosclerosis. Right carotid system: Brachiocephalic artery and right CCA origin are patent with no significant plaque. Tortuous proximal right CCA. Patent right carotid bifurcation without plaque. Tortuous, partially retropharyngeal cervical right ICA with no plaque or stenosis. Left carotid system: Proximal left  CCA mildly obscured by dense innominate vein contrast streak artifact but otherwise appears normal. Mildly tortuous left CCA. Patent left carotid bifurcation. Minimal proximal left ICA plaque. Tortuous left ICA without stenosis to the skull base. Vertebral arteries: Mildly tortuous proximal right subclavian artery without plaque or stenosis. Soft plaque at the right vertebral artery origin resulting in moderate stenosis seen on series 8, image 100. The vessel remains patent, is non dominant. Intermittent right V2 segment irregularity and stenosis is also moderate. But the vessel remains patent to the skull base. Proximal left subclavian artery and left vertebral artery origin are normal. Dominant left vertebral artery is patent to the skull base with no  significant stenosis. CTA HEAD Posterior circulation: Dominant left vertebral V4 segment is patent and primarily supplies the basilar. The distal right vertebral artery is severely stenotic or occluded at the vertebrobasilar junction (series 5, image 133). The proximal basilar artery is moderately irregular (series 12, image 24), but the basilar artery is patent with no high-grade stenosis. A ICAs appear dominant and patent. SCA and PCA origins are patent. Right PCA branches are patent with mild to moderate irregularity in most segments. There is moderate to severe distal left PCA branch irregularity and attenuation as seen on series 10, image 29, but no occlusion identified. Anterior circulation: Both ICA siphons are patent. Irregular left ICA siphon including beaded outpouching which is probably atherosclerotic pseudo lesion series 9, image 126. Mild to moderate left siphon stenosis. Furthermore, there is a slightly saccular dilatation of the distal left ICA is seen on series 9, image 124, but this does not rise to the level of the screened aneurysm at this time. Contralateral right ICA siphon is patent with calcified plaque but only mild stenosis. Patent carotid  termini. Patent MCA origins with mild stenosis on the right. Severe stenosis or near occlusion of the left ACA origin and A1 segment which is reconstituted (series 12, image 31). Anterior communicating artery is patent. Left ACA A2 segment is severely stenotic but reconstituted (series 5, image 95). Right ACA A2 is mildly irregular and patent. Left MCA M1 segment and bifurcation are patent with mild irregularity and stenosis. No left MCA branch occlusion is identified. Branches are mildly irregular throughout. Right MCA M1 segment and bifurcation are patent with mild stenosis. No right MCA branch occlusion is identified but there are severe M2 and M3 branch stenoses as seen on series 10, image 16. Venous sinuses: Early contrast timing, not well evaluated. Anatomic variants: Dominant left vertebral artery. Review of the MIP images confirms the above findings IMPRESSION: 1. Positive for Severe Intracranial Atherosclerosis including Severe Stenoses, Near Occlusions of: - Right vertebral V4, vertebrobasilar junction. - Left ACA A1, and also A2. - Right MCA M2 and M3 branches. - distal Left PCA. 2. Moderate stenoses: - cervical Right vertebral artery. - proximal Basilar artery. 3. 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 4. No extracranial carotid stenosis. Salient findings discussed by telephone with Dr. Erick Blinks on 12/10/2022 at 04:47 . Electronically Signed   By: Odessa Fleming M.D.   On: 12/10/2022 04:49   CT HEAD CODE STROKE WO CONTRAST  Result Date: 12/10/2022 CLINICAL DATA:  Code stroke. 52 year old male. Known intracranial stenosis. EXAM: CT HEAD WITHOUT CONTRAST TECHNIQUE: Contiguous axial images were obtained from the base of the skull through the vertex without intravenous contrast. RADIATION DOSE REDUCTION: This exam was performed according to the departmental dose-optimization program which includes automated exposure control, adjustment of the mA and/or kV according to patient  size and/or use of iterative reconstruction technique. COMPARISON:  Head CT 05/31/2022. FINDINGS: Brain: Sizable chronic left MCA territory infarct with encephalomalacia. Chronic posterior right MCA, posterior perirolandic area of small encephalomalacia in the right hemisphere. Superimposed Patchy and confluent bilateral cerebral white matter hypodensity. No midline shift, mass effect, or evidence of intracranial mass lesion. No ventriculomegaly. No acute intracranial hemorrhage identified. No cortically based acute infarct identified. Deep gray matter small vessel disease related heterogeneity appears stable. Vascular: Questionable faint hyperdensity of a right MCA branch in the sylvian fissure on series 2, image 20, no other suspicious hyperdensity. Calcified atherosclerosis at the skull base. Skull: Stable and intact. Sinuses/Orbits:  Visualized paranasal sinuses and mastoids are stable and well aerated. Other: Rightward gaze.  Scalp soft tissues appear stable. ASPECTS [facility] Stroke Program Early CT Score) Total score (0-10 with 10 being normal): 10, multifocal chronic encephalomalacia. CT already red. Starting CTA momentarily IMPRESSION: 1. Advanced chronic ischemic disease. ASPECTS 10. No acute intracranial hemorrhage. 2. Rightward gaze. Questionable faint hyperdensity of a right MCA branch in the Sylvian fissure. 3. Study discussed by telephone with Dr. Derry Lory on 12/10/2022 at 04:05, who advises that based on clinical presentation sequelae of seizure may be more likely. CTA pending. Electronically Signed   By: Odessa Fleming M.D.   On: 12/10/2022 04:07     TODAY-DAY OF DISCHARGE:  Subjective:   [facility] today has no headache,no chest abdominal pain,no new weakness tingling or numbness, feels much better wants to go home today.   Objective:   Blood pressure 137/85, pulse 70, temperature 98.7 F (37.1 C), temperature source Oral, resp. rate 16, height 6\' 2"  (1.88 m), weight 76 kg, SpO2  96%.  Intake/Output Summary (Last 24 hours) at 12/11/2022 1035 Last data filed at 12/11/2022 0431 Gross per 24 hour  Intake 101.16 ml  Output 550 ml  Net -448.84 ml   Filed Weights   12/10/22 0300  Weight: 76 kg    Exam: No new F.N deficits, Normal affect [place].AT,PERRAL Supple Neck,No JVD, No cervical lymphadenopathy appriciated.  Symmetrical Chest wall movement, Good air movement bilaterally, CTAB RRR,No Gallops,Rubs or new Murmurs, No Parasternal Heave +ve B.Sounds, Abd Soft, Non tender, No organomegaly appriciated, No rebound -guarding or rigidity. No Cyanosis, Clubbing or edema, No new Rash or bruise   PERTINENT RADIOLOGIC STUDIES: Overnight EEG with video  Result Date: 12/10/2022 Charlsie Quest, MD     12/11/2022  9:52 AM Patient Name: BRAZEN MARSILI MRN: [id number] Epilepsy Attending: Charlsie Quest Referring Physician/Provider: Erick Blinks, MD Duration: 12/10/2022 0500 to 12/11/2022 0547 Patient history: 52 y.o. 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 EEG to evaluate for seizure Level of alertness: Awake AEDs during EEG study: LEV Technical aspects: This EEG study was done with scalp electrodes positioned according to the 10-20 International system of electrode placement. Electrical activity was reviewed with band pass filter of 1-70Hz , sensitivity of 7 uV/mm, display speed of 32mm/sec with a 60Hz  notched filter applied as appropriate. EEG data were recorded continuously and digitally stored.  Video monitoring was available and reviewed as appropriate. Description: EEG initially showed continuous low amplitude 3 to 5 Hz theta-delta slowing in left hemisphere as well as 5-7hz  theta slowing in right hemisphere. Gradually EEG improved and showed posterior dominant rhythm of 8-9 Hz activity of moderate voltage (25-35 uV) seen predominantly in posterior head regions, asymmetric (  left<right) and reactive to eye opening and eye closing.  Sleep was characterized by sleep symptoms (12 to 14 Hz), maximal frontocentral region.  EEG showed intermittent generalized 2-5 Hz theta- delta slowing as well as continuous low amplitude 3 to 5 Hz 11 delta slowing left hemisphere.  Hyperventilation and photic stimulation were not performed.   EEG was disconnected between 12/10/2022 1519 to 1625 for maintenance of electrodes. Patient pulled electrodes and EEG was not interpretable between 12/10/2022 2109 to 12/11/2022 0052. Patient again pulled electrodes and EEG was not interpretable after 12/11/2022 0547 ABNORMALITY - Continuous slow, generalized  and lateralized left hemisphere -Background asymmetry, left<right IMPRESSION: This technically difficult study is suggestive  of cortical dysfunction arising from  left hemisphere likely secondary to underlying stroke.Additionally there is mild moderate diffuse encephalopathy. No seizures or epileptiform discharges were seen throughout the recording. Priyanka Annabelle Harman   CT ANGIO HEAD NECK W WO CM (CODE STROKE)  Result Date: 12/10/2022 CLINICAL DATA:  52 year old male code stroke versus seizure presentation. Known intracranial stenoses; severe at the proximal basilar artery, moderate to severe anterior and posterior circulation branches. EXAM: CT ANGIOGRAPHY HEAD AND NECK WITH AND WITHOUT CONTRAST TECHNIQUE: Multidetector CT imaging of the head and neck was performed using the standard protocol during bolus administration of intravenous contrast. Multiplanar CT image reconstructions and MIPs were obtained to evaluate the vascular anatomy. Carotid stenosis measurements (when applicable) are obtained utilizing NASCET criteria, using the distal internal carotid diameter as the denominator. RADIATION DOSE REDUCTION: This exam was performed according to the departmental dose-optimization program which includes automated exposure control, adjustment of the mA and/or  kV according to patient size and/or use of iterative reconstruction technique. CONTRAST:  75mL OMNIPAQUE IOHEXOL 350 MG/ML SOLN COMPARISON:  Prior CTA head and neck 06/07/2021. FINDINGS: CTA NECK Skeleton: No acute osseous abnormality identified. Multilevel cervical spine disc and endplate degeneration. Upper chest: Negative. Other neck: Negative. Aortic arch: 3 vessel arch.  Minimal arch atherosclerosis. Right carotid system: Brachiocephalic artery and right CCA origin are patent with no significant plaque. Tortuous proximal right CCA. Patent right carotid bifurcation without plaque. Tortuous, partially retropharyngeal cervical right ICA with no plaque or stenosis. Left carotid system: Proximal left CCA mildly obscured by dense innominate vein contrast streak artifact but otherwise appears normal. Mildly tortuous left CCA. Patent left carotid bifurcation. Minimal proximal left ICA plaque. Tortuous left ICA without stenosis to the skull base. Vertebral arteries: Mildly tortuous proximal right subclavian artery without plaque or stenosis. Soft plaque at the right vertebral artery origin resulting in moderate stenosis seen on series 8, image 100. The vessel remains patent, is non dominant. Intermittent right V2 segment irregularity and stenosis is also moderate. But the vessel remains patent to the skull base. Proximal left subclavian artery and left vertebral artery origin are normal. Dominant left vertebral artery is patent to the skull base with no significant stenosis. CTA HEAD Posterior circulation: Dominant left vertebral V4 segment is patent and primarily supplies the basilar. The distal right vertebral artery is severely stenotic or occluded at the vertebrobasilar junction (series 5, image 133). The proximal basilar artery is moderately irregular (series 12, image 24), but the basilar artery is patent with no high-grade stenosis. A ICAs appear dominant and patent. SCA and PCA origins are patent. Right PCA  branches are patent with mild to moderate irregularity in most segments. There is moderate to severe distal left PCA branch irregularity and attenuation as seen on series 10, image 29, but no occlusion identified. Anterior circulation: Both ICA siphons are patent. Irregular left ICA siphon including beaded outpouching which is probably atherosclerotic pseudo lesion series 9, image 126. Mild to moderate left siphon stenosis. Furthermore, there is a slightly saccular dilatation of the distal left ICA is seen on series 9, image 124, but this does not rise to the level of the screened aneurysm at this time. Contralateral right ICA siphon is patent with calcified plaque but only mild stenosis. Patent carotid termini. Patent MCA origins with mild stenosis on the right. Severe stenosis or near occlusion of the left ACA origin and A1 segment which is reconstituted (series 12, image 31). Anterior communicating artery is patent. Left ACA A2 segment is  severely stenotic but reconstituted (series 5, image 95). Right ACA A2 is mildly irregular and patent. Left MCA M1 segment and bifurcation are patent with mild irregularity and stenosis. No left MCA branch occlusion is identified. Branches are mildly irregular throughout. Right MCA M1 segment and bifurcation are patent with mild stenosis. No right MCA branch occlusion is identified but there are severe M2 and M3 branch stenoses as seen on series 10, image 16. Venous sinuses: Early contrast timing, not well evaluated. Anatomic variants: Dominant left vertebral artery. Review of the MIP images confirms the above findings IMPRESSION: 1. Positive for Severe Intracranial Atherosclerosis including Severe Stenoses, Near Occlusions of: - Right vertebral V4, vertebrobasilar junction. - Left ACA A1, and also A2. - Right MCA M2 and M3 branches. - distal Left PCA. 2. Moderate stenoses: - cervical Right vertebral artery. - proximal Basilar artery. 3. 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 4. No extracranial carotid stenosis. Salient findings discussed by telephone with Dr. Erick Blinks on 12/10/2022 at 04:47 . Electronically Signed   By: Odessa Fleming M.D.   On: 12/10/2022 04:49   CT HEAD CODE STROKE WO CONTRAST  Result Date: 12/10/2022 CLINICAL DATA:  Code stroke. 52 year old male. Known intracranial stenosis. EXAM: CT HEAD WITHOUT CONTRAST TECHNIQUE: Contiguous axial images were obtained from the base of the skull through the vertex without intravenous contrast. RADIATION DOSE REDUCTION: This exam was performed according to the departmental dose-optimization program which includes automated exposure control, adjustment of the mA and/or kV according to patient size and/or use of iterative reconstruction technique. COMPARISON:  Head CT 05/31/2022. FINDINGS: Brain: Sizable chronic left MCA territory infarct with encephalomalacia. Chronic posterior right MCA, posterior perirolandic area of small encephalomalacia in the right hemisphere. Superimposed Patchy and confluent bilateral cerebral white matter hypodensity. No midline shift, mass effect, or evidence of intracranial mass lesion. No ventriculomegaly. No acute intracranial hemorrhage identified. No cortically based acute infarct identified. Deep gray matter small vessel disease related heterogeneity appears stable. Vascular: Questionable faint hyperdensity of a right MCA branch in the sylvian fissure on series 2, image 20, no other suspicious hyperdensity. Calcified atherosclerosis at the skull base. Skull: Stable and intact. Sinuses/Orbits: Visualized paranasal sinuses and mastoids are stable and well aerated. Other: Rightward gaze.  Scalp soft tissues appear stable. ASPECTS [facility] Stroke Program Early CT Score) Total score (0-10 with 10 being normal): 10, multifocal chronic encephalomalacia. CT already red. Starting CTA momentarily IMPRESSION: 1. Advanced chronic ischemic disease. ASPECTS 10. No acute intracranial  hemorrhage. 2. Rightward gaze. Questionable faint hyperdensity of a right MCA branch in the Sylvian fissure. 3. Study discussed by telephone with Dr. Derry Lory on 12/10/2022 at 04:05, who advises that based on clinical presentation sequelae of seizure may be more likely. CTA pending. Electronically Signed   By: Odessa Fleming M.D.   On: 12/10/2022 04:07     PERTINENT LAB RESULTS: CBC: Recent Labs    12/10/22 0404 12/10/22 0615  WBC 5.8 7.6  HGB 13.3 13.0  HCT 42.4 40.9  PLT 133* 142*   CMET CMP     Component Value Date/Time   NA 141 12/10/2022 0615   NA 145 (H) 10/02/2022 1256   K 3.2 (L) 12/10/2022 0615   CL 108 12/10/2022 0615   CO2 25 12/10/2022 0615   GLUCOSE 97 12/10/2022 0615   BUN 15 12/10/2022 0615   BUN 16 10/02/2022 1256   CREATININE 1.01 12/10/2022 0615   CALCIUM 9.0 12/10/2022 0615   PROT 6.7 12/10/2022 0404  PROT 6.5 10/02/2022 1256   ALBUMIN 3.9 12/10/2022 0404   ALBUMIN 4.5 10/02/2022 1256   AST 30 12/10/2022 0404   ALT 37 12/10/2022 0404   ALKPHOS 67 12/10/2022 0404   BILITOT 0.7 12/10/2022 0404   BILITOT 0.6 10/02/2022 1256   EGFR 98 10/02/2022 1256   GFRNONAA >60 12/10/2022 0615    GFR Estimated Creatinine Clearance: 89.9 mL/min (by C-G formula based on SCr of 1.01 mg/dL). No results for input(s): "LIPASE", "AMYLASE" in the last 72 hours. No results for input(s): "CKTOTAL", "CKMB", "CKMBINDEX", "TROPONINI" in the last 72 hours. Invalid input(s): "POCBNP" No results for input(s): "DDIMER" in the last 72 hours. No results for input(s): "HGBA1C" in the last 72 hours. No results for input(s): "CHOL", "HDL", "LDLCALC", "TRIG", "CHOLHDL", "LDLDIRECT" in the last 72 hours. No results for input(s): "TSH", "T4TOTAL", "T3FREE", "THYROIDAB" in the last 72 hours.  Invalid input(s): "FREET3" No results for input(s): "VITAMINB12", "FOLATE", "FERRITIN", "TIBC", "IRON", "RETICCTPCT" in the last 72 hours. Coags: Recent Labs    12/10/22 0404  INR 1.0    Microbiology: No results found for this or any previous visit (from the past 240 hour(s)).  FURTHER DISCHARGE INSTRUCTIONS:  Get Medicines reviewed and adjusted: Please take all your medications with you for your next visit with your Primary MD  Laboratory/radiological data: Please request your Primary MD to go over all hospital tests and procedure/radiological results at the follow up, please ask your Primary MD to get all Hospital records sent to his/her office.  In some cases, they will be blood work, cultures and biopsy results pending at the time of your discharge. Please request that your primary care M.D. goes through all the records of your hospital data and follows up on these results.  Also Note the following: If you experience worsening of your admission symptoms, develop shortness of breath, life threatening emergency, suicidal or homicidal thoughts you must seek medical attention immediately by calling 911 or calling your MD immediately  if symptoms less severe.  You must read complete instructions/literature along with all the possible adverse reactions/side effects for all the Medicines you take and that have been prescribed to you. Take any new Medicines after you have completely understood and accpet all the possible adverse reactions/side effects.   Do not drive when taking Pain medications or sleeping medications (Benzodaizepines)  Do not take more than prescribed Pain, Sleep and Anxiety Medications. It is not advisable to combine anxiety,sleep and pain medications without talking with your primary care practitioner  Special Instructions: If you have smoked or chewed Tobacco  in the last 2 yrs please stop smoking, stop any regular Alcohol  and or any Recreational drug use.  Wear Seat belts while driving.  Please note: You were cared for by a hospitalist during your hospital stay. Once you are discharged, your primary care physician will handle any further medical  issues. Please note that NO REFILLS for any discharge medications will be authorized once you are discharged, as it is imperative that you return to your primary care physician (or establish a relationship with a primary care physician if you do not have one) for your post hospital discharge needs so that they can reassess your need for medications and monitor your lab values.  Total Time spent coordinating discharge including counseling, education and face to face time equals greater than 30 minutes.  SignedJeoffrey Massed 12/11/2022 10:35 AM

## 2022-12-11 NOTE — Discharge Instructions (Signed)
Standard seizure precautions: Per [place] DMV statutes, patients with seizures are not allowed to drive until  they have been seizure-free for six months. Use caution when using heavy equipment or power tools. Avoid working on ladders or at heights. Take showers instead of baths. Ensure the water temperature is not too high on the home water heater. Do not go swimming alone. When caring for infants or small children, sit down when holding, feeding, or changing them to minimize risk of injury to the child in the event you have a seizure.  To reduce risk of seizures, maintain good sleep hygiene avoid alcohol and illicit drug use, take all anti-seizure medications as prescribed.  

## 2022-12-14 ENCOUNTER — Telehealth: Payer: Self-pay | Admitting: Neurology

## 2022-12-14 NOTE — Telephone Encounter (Signed)
Pt's wife called stating that the pt had a seizure and in the hospital they put him on Keppra BID and she stated that it is making him drowsy and is not able to function at his therapy sessions. She would like to discuss on possibly changing dosage on Keppra or a complete change of the medication. Please advise.

## 2022-12-14 NOTE — Telephone Encounter (Signed)
"  Okay to reduce his dose to Keppra 250 twice daily but do not stop it.  If he has any breakthrough seizures may need to consider switching to alternative medicine but if this dose is effective can stay on it"  Called the wife and advised of Dr Marlis Edelson recommendation. She was appreciative for the call back.

## 2023-01-04 ENCOUNTER — Ambulatory Visit: Payer: Medicaid Other | Admitting: Family Medicine

## 2023-01-04 ENCOUNTER — Encounter: Payer: Self-pay | Admitting: Family Medicine

## 2023-01-04 VITALS — BP 120/80 | HR 57 | Temp 98.1°F

## 2023-01-04 DIAGNOSIS — R569 Unspecified convulsions: Secondary | ICD-10-CM | POA: Diagnosis not present

## 2023-01-04 DIAGNOSIS — I10 Essential (primary) hypertension: Secondary | ICD-10-CM

## 2023-01-04 DIAGNOSIS — I129 Hypertensive chronic kidney disease with stage 1 through stage 4 chronic kidney disease, or unspecified chronic kidney disease: Secondary | ICD-10-CM | POA: Diagnosis not present

## 2023-01-04 DIAGNOSIS — Z1211 Encounter for screening for malignant neoplasm of colon: Secondary | ICD-10-CM

## 2023-01-04 DIAGNOSIS — E785 Hyperlipidemia, unspecified: Secondary | ICD-10-CM

## 2023-01-04 DIAGNOSIS — Z2821 Immunization not carried out because of patient refusal: Secondary | ICD-10-CM

## 2023-01-04 DIAGNOSIS — G8191 Hemiplegia, unspecified affecting right dominant side: Secondary | ICD-10-CM | POA: Diagnosis not present

## 2023-01-04 DIAGNOSIS — Z0000 Encounter for general adult medical examination without abnormal findings: Secondary | ICD-10-CM

## 2023-01-04 DIAGNOSIS — Z0001 Encounter for general adult medical examination with abnormal findings: Secondary | ICD-10-CM | POA: Diagnosis not present

## 2023-01-04 DIAGNOSIS — I69320 Aphasia following cerebral infarction: Secondary | ICD-10-CM

## 2023-01-04 DIAGNOSIS — Z125 Encounter for screening for malignant neoplasm of prostate: Secondary | ICD-10-CM

## 2023-01-04 DIAGNOSIS — N183 Chronic kidney disease, stage 3 unspecified: Secondary | ICD-10-CM

## 2023-01-04 DIAGNOSIS — N1831 Chronic kidney disease, stage 3a: Secondary | ICD-10-CM | POA: Diagnosis not present
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
## 2023-01-05 LAB — PSA: Prostate Specific Ag, Serum: 0.3 ng/mL (ref 0.0–4.0)

## 2023-01-05 LAB — LIPID PANEL
Chol/HDL Ratio: 4.3 ratio (ref 0.0–5.0)
Cholesterol, Total: 117 mg/dL (ref 100–199)
HDL: 27 mg/dL — ABNORMAL LOW (ref >39)
LDL Chol Calc (NIH): 68 mg/dL (ref 0–99)
Triglycerides: 123 mg/dL (ref 0–149)
VLDL Cholesterol Cal: 22 mg/dL (ref 5–40)

## 2023-01-05 LAB — CMP14+EGFR
ALT: 31 [IU]/L (ref 0–44)
AST: 20 [IU]/L (ref 0–40)
Albumin: 4.6 g/dL (ref 3.8–4.9)
Alkaline Phosphatase: 82 [IU]/L (ref 44–121)
BUN/Creatinine Ratio: 20 (ref 9–20)
BUN: 19 mg/dL (ref 6–24)
Bilirubin Total: 0.8 mg/dL (ref 0.0–1.2)
CO2: 24 mmol/L (ref 20–29)
Calcium: 9.7 mg/dL (ref 8.7–10.2)
Chloride: 105 mmol/L (ref 96–106)
Creatinine, Ser: 0.96 mg/dL (ref 0.76–1.27)
Globulin, Total: 2 g/dL (ref 1.5–4.5)
Glucose: 78 mg/dL (ref 70–99)
Potassium: 4.4 mmol/L (ref 3.5–5.2)
Sodium: 145 mmol/L — ABNORMAL HIGH (ref 134–144)
Total Protein: 6.6 g/dL (ref 6.0–8.5)
eGFR: 94 mL/min/{1.73_m2} (ref >59)

## 2023-01-05 LAB — CBC
Hematocrit: 44.2 % (ref 37.5–51.0)
Hemoglobin: 13.9 g/dL (ref 13.0–17.7)
MCH: 28.3 pg (ref 26.6–33.0)
MCHC: 31.4 g/dL — ABNORMAL LOW (ref 31.5–35.7)
MCV: 90 fL (ref 79–97)
Platelets: 155 10*3/uL (ref 150–450)
RBC: 4.91 x10E6/uL (ref 4.14–5.80)
RDW: 13.1 % (ref 11.6–15.4)
WBC: 3.4 10*3/uL (ref 3.4–10.8)

## 2023-01-13 DIAGNOSIS — R569 Unspecified convulsions: Secondary | ICD-10-CM | POA: Insufficient documentation

## 2023-01-13 DIAGNOSIS — I771 Stricture of artery: Secondary | ICD-10-CM | POA: Insufficient documentation

## 2023-01-13 DIAGNOSIS — I442 Atrioventricular block, complete: Secondary | ICD-10-CM | POA: Insufficient documentation

## 2023-01-13 DIAGNOSIS — N1831 Chronic kidney disease, stage 3a: Secondary | ICD-10-CM | POA: Insufficient documentation

## 2023-01-13 DIAGNOSIS — Z1211 Encounter for screening for malignant neoplasm of colon: Secondary | ICD-10-CM | POA: Insufficient documentation

## 2023-01-13 DIAGNOSIS — Z0000 Encounter for general adult medical examination without abnormal findings: Secondary | ICD-10-CM | POA: Insufficient documentation

## 2023-01-13 DIAGNOSIS — Z125 Encounter for screening for malignant neoplasm of prostate: Secondary | ICD-10-CM | POA: Insufficient documentation

## 2023-01-13 DIAGNOSIS — I129 Hypertensive chronic kidney disease with stage 1 through stage 4 chronic kidney disease, or unspecified chronic kidney disease: Secondary | ICD-10-CM | POA: Insufficient documentation

## 2023-01-13 DIAGNOSIS — Z2821 Immunization not carried out because of patient refusal: Secondary | ICD-10-CM | POA: Insufficient documentation

## 2023-01-13 NOTE — Progress Notes (Signed)
Low HDL (27), eat foods that have healthy fats e.g avocado, olive oils, salmon, nuts etc, these are heart healthy . HDL is good cholesterol that helps protect the heart.

## 2023-01-13 NOTE — Assessment & Plan Note (Signed)
Continue tx plan; atorvastatin 80 mg every day.

## 2023-01-13 NOTE — Assessment & Plan Note (Signed)
Chronic. BP well controlled today, continue current tx, as prescribed by Cardiology,Amlodipine 5mg  every day, Cozaar 25 mg every day, Metoprolol 12.5 mg BID, Isosorbide dinitrate 10 mg BID.

## 2023-01-16 ENCOUNTER — Encounter: Payer: Medicaid Other | Attending: Physical Medicine & Rehabilitation | Admitting: Physical Medicine & Rehabilitation

## 2023-01-16 DIAGNOSIS — G8111 Spastic hemiplegia affecting right dominant side: Secondary | ICD-10-CM | POA: Insufficient documentation

## 2023-01-17 DIAGNOSIS — N183 Chronic kidney disease, stage 3 unspecified: Secondary | ICD-10-CM | POA: Insufficient documentation

## 2023-01-17 NOTE — Assessment & Plan Note (Signed)
Patient is getting Cologuard because it will be hard to get up and go to the bathroom with the regular prep.per wife

## 2023-01-17 NOTE — Assessment & Plan Note (Signed)
Spouse encouraged to keep BP well controlled and avoid use of NSAIDs.

## 2023-01-17 NOTE — Addendum Note (Signed)
Addended byMoshe Salisbury, Kizzy Olafson E on: 01/17/2023 04:52 PM   Modules accepted: Level of Service

## 2023-01-17 NOTE — Assessment & Plan Note (Signed)
Patient nods and responses with yes, no when spoken to

## 2023-01-17 NOTE — Assessment & Plan Note (Addendum)
New onset seizure; hospitalized 10/27-10/28/2024 and started on Keppra 250 mg BID.

## 2023-01-17 NOTE — Assessment & Plan Note (Signed)
Right sided weakness related to stroke.

## 2023-01-25 LAB — COLOGUARD: COLOGUARD: NEGATIVE

## 2023-02-09 ENCOUNTER — Encounter: Payer: Self-pay | Admitting: Physical Medicine & Rehabilitation

## 2023-02-09 ENCOUNTER — Encounter (HOSPITAL_BASED_OUTPATIENT_CLINIC_OR_DEPARTMENT_OTHER): Payer: Medicaid Other | Admitting: Physical Medicine & Rehabilitation

## 2023-02-09 VITALS — BP 134/86 | HR 63 | Ht 74.0 in | Wt 174.0 lb

## 2023-02-09 DIAGNOSIS — G8111 Spastic hemiplegia affecting right dominant side: Secondary | ICD-10-CM

## 2023-02-09 MED ORDER — ONABOTULINUMTOXINA 100 UNITS IJ SOLR
400.0000 [IU] | Freq: Once | INTRAMUSCULAR | Status: AC
  Administered 2023-02-09: 400 [IU] via INTRAMUSCULAR

## 2023-02-09 NOTE — Progress Notes (Signed)
Botox Injection for spasticity using needle EMG guidance  Dilution: 50 Units/ml Indication: Severe spasticity which interferes with ADL,mobility and/or  hygiene and is unresponsive to medication management and other conservative care Informed consent was obtained after describing risks and benefits of the procedure with the patient. This includes bleeding, bruising, infection, excessive weakness, or medication side effects. A REMS form is on file and signed. Needle:  needle electrode Number of units per muscle Right Pec 100U Biceps 50 Brachialis 50 Brachiorad 50 FCU 50 FCR 50 FDS 25 FDP 25 All injections were done after obtaining appropriate EMG activity and after negative drawback for blood. The patient tolerated the procedure well. Post procedure instructions were given. A followup appointment was made.

## 2023-02-09 NOTE — Patient Instructions (Signed)

## 2023-04-05 ENCOUNTER — Other Ambulatory Visit: Payer: Self-pay | Admitting: Family Medicine

## 2023-04-05 DIAGNOSIS — R569 Unspecified convulsions: Secondary | ICD-10-CM

## 2023-04-06 ENCOUNTER — Other Ambulatory Visit: Payer: Self-pay | Admitting: Family Medicine

## 2023-04-06 DIAGNOSIS — I69398 Other sequelae of cerebral infarction: Secondary | ICD-10-CM | POA: Insufficient documentation

## 2023-05-10 ENCOUNTER — Encounter: Payer: Self-pay | Admitting: Physical Medicine & Rehabilitation

## 2023-05-10 ENCOUNTER — Encounter: Payer: Medicaid Other | Attending: Physical Medicine & Rehabilitation | Admitting: Physical Medicine & Rehabilitation

## 2023-05-10 VITALS — BP 136/86 | HR 60 | Ht 74.0 in | Wt 170.8 lb

## 2023-05-10 DIAGNOSIS — G8111 Spastic hemiplegia affecting right dominant side: Secondary | ICD-10-CM | POA: Diagnosis not present

## 2023-05-10 MED ORDER — SODIUM CHLORIDE (PF) 0.9 % IJ SOLN
8.0000 mL | Freq: Once | INTRAMUSCULAR | Status: AC
  Administered 2023-05-10: 8 mL via INTRAVENOUS

## 2023-05-10 MED ORDER — ONABOTULINUMTOXINA 100 UNITS IJ SOLR
400.0000 [IU] | Freq: Once | INTRAMUSCULAR | Status: AC
  Administered 2023-05-10: 400 [IU] via INTRAMUSCULAR

## 2023-05-10 NOTE — Patient Instructions (Signed)

## 2023-05-10 NOTE — Progress Notes (Signed)
Botox Injection for spasticity using needle EMG guidance  Dilution: 50 Units/ml Indication: Severe spasticity which interferes with ADL,mobility and/or  hygiene and is unresponsive to medication management and other conservative care Informed consent was obtained after describing risks and benefits of the procedure with the patient. This includes bleeding, bruising, infection, excessive weakness, or medication side effects. A REMS form is on file and signed. Needle:  needle electrode Number of units per muscle Right Pec 100U Biceps 50 Brachialis 50 Brachiorad 50 FCU 50 FCR 50 FDS 25 FDP 25 All injections were done after obtaining appropriate EMG activity and after negative drawback for blood. The patient tolerated the procedure well. Post procedure instructions were given. A followup appointment was made.

## 2023-07-05 ENCOUNTER — Encounter: Payer: Self-pay | Admitting: Family Medicine

## 2023-07-05 ENCOUNTER — Ambulatory Visit: Payer: Medicaid Other | Admitting: Family Medicine

## 2023-07-05 VITALS — BP 112/70 | HR 65 | Temp 99.4°F | Ht 74.0 in | Wt 176.0 lb

## 2023-07-05 DIAGNOSIS — R569 Unspecified convulsions: Secondary | ICD-10-CM

## 2023-07-05 DIAGNOSIS — G8191 Hemiplegia, unspecified affecting right dominant side: Secondary | ICD-10-CM | POA: Diagnosis not present

## 2023-07-05 DIAGNOSIS — I639 Cerebral infarction, unspecified: Secondary | ICD-10-CM

## 2023-07-05 DIAGNOSIS — R4701 Aphasia: Secondary | ICD-10-CM | POA: Diagnosis not present

## 2023-07-05 DIAGNOSIS — G40901 Epilepsy, unspecified, not intractable, with status epilepticus: Secondary | ICD-10-CM

## 2023-07-05 DIAGNOSIS — I10 Essential (primary) hypertension: Secondary | ICD-10-CM
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
## 2023-07-06 LAB — CBC WITH DIFFERENTIAL/PLATELET
Basophils Absolute: 0 10*3/uL (ref 0.0–0.2)
Basos: 0 %
EOS (ABSOLUTE): 0.1 10*3/uL (ref 0.0–0.4)
Eos: 2 %
Hematocrit: 43.9 % (ref 37.5–51.0)
Hemoglobin: 13.7 g/dL (ref 13.0–17.7)
Immature Grans (Abs): 0 10*3/uL (ref 0.0–0.1)
Immature Granulocytes: 0 %
Lymphocytes Absolute: 1.4 10*3/uL (ref 0.7–3.1)
Lymphs: 31 %
MCH: 28.2 pg (ref 26.6–33.0)
MCHC: 31.2 g/dL — ABNORMAL LOW (ref 31.5–35.7)
MCV: 91 fL (ref 79–97)
Monocytes Absolute: 0.4 10*3/uL (ref 0.1–0.9)
Monocytes: 8 %
Neutrophils Absolute: 2.6 10*3/uL (ref 1.4–7.0)
Neutrophils: 59 %
Platelets: 165 10*3/uL (ref 150–450)
RBC: 4.85 x10E6/uL (ref 4.14–5.80)
RDW: 12.8 % (ref 11.6–15.4)
WBC: 4.5 10*3/uL (ref 3.4–10.8)

## 2023-07-06 LAB — BMP8+EGFR
BUN/Creatinine Ratio: 15 (ref 9–20)
BUN: 14 mg/dL (ref 6–24)
CO2: 24 mmol/L (ref 20–29)
Calcium: 9.7 mg/dL (ref 8.7–10.2)
Chloride: 109 mmol/L — ABNORMAL HIGH (ref 96–106)
Creatinine, Ser: 0.93 mg/dL (ref 0.76–1.27)
Glucose: 82 mg/dL (ref 70–99)
Potassium: 4.2 mmol/L (ref 3.5–5.2)
Sodium: 147 mmol/L — ABNORMAL HIGH (ref 134–144)
eGFR: 98 mL/min/{1.73_m2} (ref >59)

## 2023-07-17 ENCOUNTER — Ambulatory Visit: Payer: Self-pay | Admitting: Family Medicine

## 2023-07-17 DIAGNOSIS — I10 Essential (primary) hypertension: Secondary | ICD-10-CM | POA: Insufficient documentation

## 2023-07-17 NOTE — Assessment & Plan Note (Signed)
Chronic. Advised to continue monitor BP readings and report extreme spikes to cardiologist

## 2023-07-17 NOTE — Assessment & Plan Note (Signed)
 Right sided weakness related to stroke.

## 2023-07-17 NOTE — Assessment & Plan Note (Signed)
-   Continue Keppra 500mg twice daily

## 2023-07-17 NOTE — Assessment & Plan Note (Signed)
 Able to nod his head, making simple gestures.

## 2023-08-10 ENCOUNTER — Encounter (HOSPITAL_COMMUNITY): Payer: Self-pay | Admitting: Interventional Radiology

## 2023-08-14 ENCOUNTER — Encounter: Attending: Physical Medicine & Rehabilitation | Admitting: Physical Medicine & Rehabilitation

## 2023-08-14 ENCOUNTER — Encounter: Payer: Self-pay | Admitting: Physical Medicine & Rehabilitation

## 2023-08-14 VITALS — BP 142/91 | HR 68 | Ht 74.0 in | Wt 176.4 lb

## 2023-08-14 DIAGNOSIS — G8111 Spastic hemiplegia affecting right dominant side: Secondary | ICD-10-CM | POA: Diagnosis not present

## 2023-08-14 MED ORDER — SODIUM CHLORIDE (PF) 0.9 % IJ SOLN
8.0000 mL | Freq: Once | INTRAMUSCULAR | Status: AC
  Administered 2023-08-14: 8 mL via INTRAVENOUS

## 2023-08-14 MED ORDER — ONABOTULINUMTOXINA 100 UNITS IJ SOLR
400.0000 [IU] | Freq: Once | INTRAMUSCULAR | Status: AC
  Administered 2023-08-14: 400 [IU] via INTRAMUSCULAR

## 2023-08-14 NOTE — Progress Notes (Signed)
 Botox  Injection for spasticity using needle EMG guidance  Dilution: 50 Units/ml Indication: Severe spasticity which interferes with ADL,mobility and/or  hygiene and is unresponsive to medication management and other conservative care Informed consent was obtained after describing risks and benefits of the procedure with the patient. This includes bleeding, bruising, infection, excessive weakness, or medication side effects. A REMS form is on file and signed. Needle:  needle electrode Number of units per muscle Right Pec 100U Biceps 50  Brachiorad 50 FCU 50 FCR 50 FDS 50 FDP 50 All injections were done after obtaining appropriate EMG activity and after negative drawback for blood. The patient tolerated the procedure well. Post procedure instructions were given. A followup appointment was made.

## 2023-08-14 NOTE — Patient Instructions (Signed)

## 2023-08-25 ENCOUNTER — Inpatient Hospital Stay (HOSPITAL_COMMUNITY)

## 2023-08-25 ENCOUNTER — Emergency Department (HOSPITAL_COMMUNITY)

## 2023-08-25 ENCOUNTER — Inpatient Hospital Stay (HOSPITAL_COMMUNITY)
Admission: EM | Admit: 2023-08-25 | Discharge: 2023-09-08 | DRG: 065 | Disposition: A | Discharge status: Hospice | Attending: Internal Medicine | Admitting: Internal Medicine

## 2023-08-25 DIAGNOSIS — I779 Disorder of arteries and arterioles, unspecified: Secondary | ICD-10-CM | POA: Diagnosis not present

## 2023-08-25 DIAGNOSIS — N1831 Chronic kidney disease, stage 3a: Secondary | ICD-10-CM | POA: Diagnosis present

## 2023-08-25 DIAGNOSIS — I63339 Cerebral infarction due to thrombosis of unspecified posterior cerebral artery: Secondary | ICD-10-CM | POA: Diagnosis not present

## 2023-08-25 DIAGNOSIS — R9082 White matter disease, unspecified: Secondary | ICD-10-CM | POA: Diagnosis present

## 2023-08-25 DIAGNOSIS — Z8249 Family history of ischemic heart disease and other diseases of the circulatory system: Secondary | ICD-10-CM

## 2023-08-25 DIAGNOSIS — I6389 Other cerebral infarction: Secondary | ICD-10-CM | POA: Diagnosis not present

## 2023-08-25 DIAGNOSIS — E440 Moderate protein-calorie malnutrition: Secondary | ICD-10-CM | POA: Diagnosis present

## 2023-08-25 DIAGNOSIS — Z7189 Other specified counseling: Secondary | ICD-10-CM | POA: Diagnosis not present

## 2023-08-25 DIAGNOSIS — Z79899 Other long term (current) drug therapy: Secondary | ICD-10-CM | POA: Diagnosis not present

## 2023-08-25 DIAGNOSIS — I69391 Dysphagia following cerebral infarction: Secondary | ICD-10-CM

## 2023-08-25 DIAGNOSIS — Z515 Encounter for palliative care: Secondary | ICD-10-CM

## 2023-08-25 DIAGNOSIS — I255 Ischemic cardiomyopathy: Secondary | ICD-10-CM | POA: Diagnosis present

## 2023-08-25 DIAGNOSIS — E785 Hyperlipidemia, unspecified: Secondary | ICD-10-CM | POA: Diagnosis present

## 2023-08-25 DIAGNOSIS — I5022 Chronic systolic (congestive) heart failure: Secondary | ICD-10-CM | POA: Diagnosis present

## 2023-08-25 DIAGNOSIS — E860 Dehydration: Secondary | ICD-10-CM | POA: Diagnosis present

## 2023-08-25 DIAGNOSIS — Z66 Do not resuscitate: Secondary | ICD-10-CM | POA: Diagnosis not present

## 2023-08-25 DIAGNOSIS — I160 Hypertensive urgency: Secondary | ICD-10-CM | POA: Diagnosis present

## 2023-08-25 DIAGNOSIS — Z833 Family history of diabetes mellitus: Secondary | ICD-10-CM | POA: Diagnosis not present

## 2023-08-25 DIAGNOSIS — I63532 Cerebral infarction due to unspecified occlusion or stenosis of left posterior cerebral artery: Principal | ICD-10-CM | POA: Diagnosis present

## 2023-08-25 DIAGNOSIS — F0390 Unspecified dementia without behavioral disturbance: Secondary | ICD-10-CM | POA: Diagnosis present

## 2023-08-25 DIAGNOSIS — R000 Tachycardia, unspecified: Secondary | ICD-10-CM | POA: Diagnosis present

## 2023-08-25 DIAGNOSIS — I2510 Atherosclerotic heart disease of native coronary artery without angina pectoris: Secondary | ICD-10-CM | POA: Diagnosis present

## 2023-08-25 DIAGNOSIS — F17210 Nicotine dependence, cigarettes, uncomplicated: Secondary | ICD-10-CM | POA: Diagnosis present

## 2023-08-25 DIAGNOSIS — R4589 Other symptoms and signs involving emotional state: Secondary | ICD-10-CM | POA: Diagnosis not present

## 2023-08-25 DIAGNOSIS — I63432 Cerebral infarction due to embolism of left posterior cerebral artery: Secondary | ICD-10-CM | POA: Diagnosis not present

## 2023-08-25 DIAGNOSIS — I651 Occlusion and stenosis of basilar artery: Secondary | ICD-10-CM | POA: Diagnosis present

## 2023-08-25 DIAGNOSIS — I130 Hypertensive heart and chronic kidney disease with heart failure and stage 1 through stage 4 chronic kidney disease, or unspecified chronic kidney disease: Secondary | ICD-10-CM | POA: Diagnosis present

## 2023-08-25 DIAGNOSIS — R29727 NIHSS score 27: Secondary | ICD-10-CM | POA: Diagnosis present

## 2023-08-25 DIAGNOSIS — G40909 Epilepsy, unspecified, not intractable, without status epilepticus: Secondary | ICD-10-CM | POA: Diagnosis present

## 2023-08-25 DIAGNOSIS — R29810 Facial weakness: Secondary | ICD-10-CM | POA: Diagnosis present

## 2023-08-25 DIAGNOSIS — G9389 Other specified disorders of brain: Secondary | ICD-10-CM | POA: Diagnosis present

## 2023-08-25 DIAGNOSIS — I69320 Aphasia following cerebral infarction: Secondary | ICD-10-CM

## 2023-08-25 DIAGNOSIS — I5020 Unspecified systolic (congestive) heart failure: Secondary | ICD-10-CM | POA: Diagnosis not present

## 2023-08-25 DIAGNOSIS — I2489 Other forms of acute ischemic heart disease: Secondary | ICD-10-CM | POA: Diagnosis present

## 2023-08-25 DIAGNOSIS — R0682 Tachypnea, not elsewhere classified: Secondary | ICD-10-CM | POA: Diagnosis present

## 2023-08-25 DIAGNOSIS — G40802 Other epilepsy, not intractable, without status epilepticus: Secondary | ICD-10-CM | POA: Diagnosis not present

## 2023-08-25 DIAGNOSIS — Z789 Other specified health status: Secondary | ICD-10-CM | POA: Diagnosis not present

## 2023-08-25 DIAGNOSIS — Z6822 Body mass index (BMI) 22.0-22.9, adult: Secondary | ICD-10-CM

## 2023-08-25 DIAGNOSIS — R569 Unspecified convulsions: Secondary | ICD-10-CM

## 2023-08-25 DIAGNOSIS — Z955 Presence of coronary angioplasty implant and graft: Secondary | ICD-10-CM

## 2023-08-25 DIAGNOSIS — Z751 Person awaiting admission to adequate facility elsewhere: Secondary | ICD-10-CM

## 2023-08-25 DIAGNOSIS — I110 Hypertensive heart disease with heart failure: Secondary | ICD-10-CM | POA: Diagnosis not present

## 2023-08-25 DIAGNOSIS — I639 Cerebral infarction, unspecified: Principal | ICD-10-CM | POA: Diagnosis present

## 2023-08-25 DIAGNOSIS — I69351 Hemiplegia and hemiparesis following cerebral infarction affecting right dominant side: Secondary | ICD-10-CM

## 2023-08-25 DIAGNOSIS — Z7982 Long term (current) use of aspirin: Secondary | ICD-10-CM

## 2023-08-25 DIAGNOSIS — M24541 Contracture, right hand: Secondary | ICD-10-CM | POA: Diagnosis present

## 2023-08-25 DIAGNOSIS — E872 Acidosis, unspecified: Secondary | ICD-10-CM | POA: Diagnosis present

## 2023-08-25 DIAGNOSIS — R4701 Aphasia: Secondary | ICD-10-CM | POA: Diagnosis not present

## 2023-08-25 DIAGNOSIS — I252 Old myocardial infarction: Secondary | ICD-10-CM

## 2023-08-25 LAB — I-STAT CHEM 8, ED
BUN: 16 mg/dL (ref 6–20)
Calcium, Ion: 1.14 mmol/L — ABNORMAL LOW (ref 1.15–1.40)
Chloride: 106 mmol/L (ref 98–111)
Creatinine, Ser: 1.1 mg/dL (ref 0.61–1.24)
Glucose, Bld: 105 mg/dL — ABNORMAL HIGH (ref 70–99)
HCT: 45 % (ref 39.0–52.0)
Hemoglobin: 15.3 g/dL (ref 13.0–17.0)
Potassium: 6.1 mmol/L — ABNORMAL HIGH (ref 3.5–5.1)
Sodium: 141 mmol/L (ref 135–145)
TCO2: 30 mmol/L (ref 22–32)

## 2023-08-25 LAB — TROPONIN I (HIGH SENSITIVITY)
Troponin I (High Sensitivity): 82 ng/L — ABNORMAL HIGH (ref <18)
Troponin I (High Sensitivity): 149 ng/L (ref <18)

## 2023-08-25 LAB — COMPREHENSIVE METABOLIC PANEL WITH GFR
ALT: 59 U/L — ABNORMAL HIGH (ref 0–44)
AST: 30 U/L (ref 15–41)
Albumin: 3.9 g/dL (ref 3.5–5.0)
Alkaline Phosphatase: 62 U/L (ref 38–126)
Anion gap: 8 (ref 5–15)
BUN: 10 mg/dL (ref 6–20)
CO2: 32 mmol/L (ref 22–32)
Calcium: 9.6 mg/dL (ref 8.9–10.3)
Chloride: 100 mmol/L (ref 98–111)
Creatinine, Ser: 1.03 mg/dL (ref 0.61–1.24)
GFR, Estimated: >60 mL/min (ref >60)
Glucose, Bld: 101 mg/dL — ABNORMAL HIGH (ref 70–99)
Potassium: 3.3 mmol/L — ABNORMAL LOW (ref 3.5–5.1)
Sodium: 140 mmol/L (ref 135–145)
Total Bilirubin: 1.2 mg/dL (ref 0.0–1.2)
Total Protein: 6.8 g/dL (ref 6.5–8.1)

## 2023-08-25 LAB — ECHOCARDIOGRAM COMPLETE
Calc EF: 26.2 %
S' Lateral: 4.2 cm
Single Plane A2C EF: 39.3 %
Single Plane A4C EF: 13.8 %

## 2023-08-25 LAB — CBC WITH DIFFERENTIAL/PLATELET
Abs Immature Granulocytes: 0.01 K/uL (ref 0.00–0.07)
Basophils Absolute: 0 K/uL (ref 0.0–0.1)
Basophils Relative: 1 %
Eosinophils Absolute: 0.1 K/uL (ref 0.0–0.5)
Eosinophils Relative: 1 %
HCT: 45.2 % (ref 39.0–52.0)
Hemoglobin: 14.3 g/dL (ref 13.0–17.0)
Immature Granulocytes: 0 %
Lymphocytes Relative: 20 %
Lymphs Abs: 1.3 K/uL (ref 0.7–4.0)
MCH: 27.8 pg (ref 26.0–34.0)
MCHC: 31.6 g/dL (ref 30.0–36.0)
MCV: 87.8 fL (ref 80.0–100.0)
Monocytes Absolute: 0.6 K/uL (ref 0.1–1.0)
Monocytes Relative: 10 %
Neutro Abs: 4.3 K/uL (ref 1.7–7.7)
Neutrophils Relative %: 68 %
Platelets: 166 K/uL (ref 150–400)
RBC: 5.15 MIL/uL (ref 4.22–5.81)
RDW: 13.6 % (ref 11.5–15.5)
WBC: 6.4 K/uL (ref 4.0–10.5)
nRBC: 0 % (ref 0.0–0.2)

## 2023-08-25 LAB — I-STAT VENOUS BLOOD GAS, ED
Acid-Base Excess: 5 mmol/L — ABNORMAL HIGH (ref 0.0–2.0)
Bicarbonate: 30.9 mmol/L — ABNORMAL HIGH (ref 20.0–28.0)
Calcium, Ion: 1.12 mmol/L — ABNORMAL LOW (ref 1.15–1.40)
HCT: 44 % (ref 39.0–52.0)
Hemoglobin: 15 g/dL (ref 13.0–17.0)
O2 Saturation: 75 %
Potassium: 6 mmol/L — ABNORMAL HIGH (ref 3.5–5.1)
Sodium: 141 mmol/L (ref 135–145)
TCO2: 32 mmol/L (ref 22–32)
pCO2, Ven: 47.3 mmHg (ref 44–60)
pH, Ven: 7.424 (ref 7.25–7.43)
pO2, Ven: 40 mmHg (ref 32–45)

## 2023-08-25 LAB — AMMONIA: Ammonia: 14 umol/L (ref 9–35)

## 2023-08-25 LAB — LACTIC ACID, PLASMA: Lactic Acid, Venous: 1.6 mmol/L (ref 0.5–1.9)

## 2023-08-25 LAB — TSH: TSH: 0.346 u[IU]/mL — ABNORMAL LOW (ref 0.350–4.500)

## 2023-08-25 LAB — I-STAT CG4 LACTIC ACID, ED: Lactic Acid, Venous: 2 mmol/L (ref 0.5–1.9)

## 2023-08-25 LAB — CBG MONITORING, ED: Glucose-Capillary: 99 mg/dL (ref 70–99)

## 2023-08-25 MED ORDER — LEVETIRACETAM (KEPPRA) 500 MG/5 ML ADULT IV PUSH
250.0000 mg | Freq: Two times a day (BID) | INTRAVENOUS | Status: DC
  Administered 2023-08-25 – 2023-08-29 (×9): 250 mg via INTRAVENOUS
  Filled 2023-08-25 (×9): qty 5

## 2023-08-25 MED ORDER — PERFLUTREN LIPID MICROSPHERE
1.0000 mL | INTRAVENOUS | Status: AC | PRN
  Administered 2023-08-25: 2 mL via INTRAVENOUS

## 2023-08-25 MED ORDER — STROKE: EARLY STAGES OF RECOVERY BOOK
Freq: Once | Status: AC
  Filled 2023-08-25: qty 1

## 2023-08-25 MED ORDER — LABETALOL HCL 5 MG/ML IV SOLN
10.0000 mg | INTRAVENOUS | Status: AC | PRN
  Administered 2023-08-28 (×2): 10 mg via INTRAVENOUS
  Filled 2023-08-25 (×2): qty 4

## 2023-08-25 MED ORDER — IOHEXOL 350 MG/ML SOLN
75.0000 mL | Freq: Once | INTRAVENOUS | Status: AC | PRN
  Administered 2023-08-25: 75 mL via INTRAVENOUS

## 2023-08-25 MED ORDER — ATORVASTATIN CALCIUM 80 MG PO TABS
80.0000 mg | ORAL_TABLET | Freq: Every day | ORAL | Status: DC
  Filled 2023-08-25: qty 1

## 2023-08-25 MED ORDER — PANTOPRAZOLE SODIUM 40 MG PO TBEC
40.0000 mg | DELAYED_RELEASE_TABLET | Freq: Every day | ORAL | Status: DC
  Administered 2023-08-29: 40 mg via ORAL
  Filled 2023-08-25: qty 1

## 2023-08-25 MED ORDER — ASPIRIN 325 MG PO TABS
325.0000 mg | ORAL_TABLET | Freq: Every day | ORAL | Status: DC
  Administered 2023-08-29: 325 mg via ORAL
  Filled 2023-08-25 (×2): qty 1

## 2023-08-25 MED ORDER — LEVETIRACETAM (KEPPRA) 500 MG/5 ML ADULT IV PUSH
1500.0000 mg | Freq: Once | INTRAVENOUS | Status: AC
  Administered 2023-08-25: 1500 mg via INTRAVENOUS
  Filled 2023-08-25: qty 15

## 2023-08-25 MED ORDER — ASPIRIN 300 MG RE SUPP
300.0000 mg | Freq: Every day | RECTAL | Status: DC
  Administered 2023-08-26 – 2023-08-28 (×3): 300 mg via RECTAL
  Filled 2023-08-25 (×3): qty 1

## 2023-08-25 NOTE — Progress Notes (Signed)
 2030 [facility] notified on pt's Critical Level of Troponin I which is 149. Informed MD Howerter, ordered to do EKG Stat and repeat Trop I in the morning.   Updates MD Howerter and aware on EKG result which is Normal Sinus Rhythm, Inferior Infarct, no new orders were done.
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
## 2023-08-25 NOTE — Progress Notes (Signed)
  Echocardiogram 2D Echocardiogram has been performed.  Henry Simmons 08/25/2023, 5:46 PM

## 2023-08-25 NOTE — ED Provider Notes (Addendum)
 [place] EMERGENCY DEPARTMENT AT [facility] Provider Note   CSN: [id number] Arrival date & time: 08/25/23  1145     Patient presents with: Henry Simmons is a 53 y.o. male.   HPI    53 y.o. 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 The hx was provided by EMS. They state that the patient went to bed last night at his baseline. When family went to arouse him, he was unresponsive. He would not arouse to sternal rub. Family stated that he had not slept well last night. On EMS attempts to transport, the patient became more arousable. He is non verbal at baseline and has right sided hemiparesis at baseline from old strokes. No new deficits per family.  Family: He went to sleep, was very sluggish, couldn't arouse him. Will open eyes then go right back into a somnolent state. Not at his baseline. No new deficits. Was up late last night into the morning. Ate breakfast, his change in mental status happened around 0900.   He arrives with lip abrasions consistent with possible seizure activity. He arrives GCS 8 (2-1-5). Appears somnolent compared to his reported baseline.   Prior to Admission medications   Medication Sig Start Date End Date Taking? Authorizing Provider  acetaminophen  (TYLENOL ) 325 MG tablet Take 2 tablets (650 mg total) by mouth every 4 (four) hours as needed for mild pain (or temp > 37.5 C (99.5 F)). 06/14/21   Claudene Pacific, MD  amLODipine  (NORVASC ) 5 MG tablet Take 1 tablet every day by oral route. 12/27/19   [provider]  Ascorbic Acid  (VITAMIN C  PO) Take 1,000 mg by mouth daily.    [provider]  aspirin  81 MG EC tablet Take 1 tablet (81 mg total) by mouth daily. Swallow whole. 12/02/20   Dann Candyce RAMAN, MD  atorvastatin  (LIPITOR ) 80 MG tablet Take 1 tablet (80 mg total) by mouth daily. 07/06/21   Angiulli, Daniel J, PA-C  Ferrous Sulfate (IRON PO) Take 65 mg by mouth daily.    [provider]  isosorbide  dinitrate (ISORDIL ) 10 MG tablet Take 1 tablet (10 mg total) by mouth 2 (two) times daily. 07/06/21   Angiulli, Toribio PARAS, PA-C  levETIRAcetam  (KEPPRA ) 500 MG tablet Take 1 tablet (500 mg total) by mouth 2 (two) times daily. Please supply in adherent pouches 12/11/22   Ghimire, Donalda HERO, MD  losartan  (COZAAR ) 25 MG tablet Take 25 mg by mouth daily. 08/21/22   [provider]  metoprolol  tartrate (LOPRESSOR ) 25 MG tablet Take 0.5 tablets (12.5 mg total) by mouth 2 (two) times daily. Patient taking differently: Take 25 mg by mouth 2 (two) times daily. 07/06/21   Angiulli, Toribio PARAS, PA-C  nitroGLYCERIN  (NITROSTAT ) 0.4 MG SL tablet Place 1 tablet (0.4 mg total) under the tongue every 5 (five) minutes as needed for chest pain. 07/06/21   Angiulli, Toribio PARAS, PA-C  pantoprazole  (PROTONIX ) 40 MG tablet Take 1 tablet (40 mg total) by mouth daily. 07/06/21   Angiulli, Toribio PARAS, PA-C    Allergies: Patient has no known allergies.    Review of Systems  Unable to perform ROS: Mental status change    Updated Vital Signs BP (!) 208/140 (BP Location: Right Arm)   Pulse (!) 138  Temp 98.3 F (36.8 C) (Oral)   Resp 20   SpO2 100%   Physical Exam Vitals and nursing note reviewed.  Constitutional:      General: He is not in acute distress. HENT:     Head: Normocephalic and atraumatic.     Comments: Lower lip abrasion present Eyes:     Conjunctiva/sclera: Conjunctivae normal.     Pupils: Pupils are equal, round, and reactive to light.  Cardiovascular:     Rate and Rhythm: Normal rate and regular rhythm.  Pulmonary:     Effort: Pulmonary effort is normal. No respiratory distress.  Abdominal:     General: There is no distension.     Tenderness: There is no guarding.   Musculoskeletal:        General: No deformity or signs of injury.     Cervical back: Neck supple.  Skin:    Findings: No lesion or rash.  Neurological:     GCS: GCS eye subscore is 2. GCS verbal subscore is 1. GCS motor subscore is 5.     Comments: MENTAL STATUS EXAM:    Orientation: Unable to assess Memory: Unable to assess Language: Nonverbal  CRANIAL NERVES:    CN 2 (Optic): Unable to assess CN 3,4,6 (EOM): Pupils pinpoint and reactive to light. Unable to assess EOMs CN 5 (Trigeminal): Unable to assess CN 7 (Facial): Unable to assess CN 8 (Auditory): Unable to assess CN 9,10 (Glossophar): Unable to assess CN 11 (spinal access): Unable to assess CN 12 (Hypoglossal): Unable to assess  MOTOR:  Muscle Strength: RUE contracted at baseline, withdraws to painful nailbed pressure all other extremeties  COORDINATION:   No tremor  SENSATION:   With withdraw to painful stimuli  GAIT: Gait not assessed      (all labs ordered are listed, but only abnormal results are displayed) Labs Reviewed  I-STAT CHEM 8, ED - Abnormal; Notable for the following components:      Result Value   Potassium 6.1 (*)    Glucose, Bld 105 (*)    Calcium , Ion 1.14 (*)    All other components within normal limits  I-STAT CG4 LACTIC ACID, ED - Abnormal; Notable for the following components:   Lactic Acid, Venous 2.0 (*)    All other components within normal limits  I-STAT VENOUS BLOOD GAS, ED - Abnormal; Notable for the following components:   Bicarbonate 30.9 (*)    Acid-Base Excess 5.0 (*)    Potassium 6.0 (*)    Calcium , Ion 1.12 (*)    All other components within normal limits  CULTURE, BLOOD (ROUTINE X 2)  CULTURE, BLOOD (ROUTINE X 2)  CBC WITH DIFFERENTIAL/PLATELET  COMPREHENSIVE METABOLIC PANEL WITH GFR  URINALYSIS, W/ REFLEX TO CULTURE (INFECTION SUSPECTED)  RAPID URINE DRUG SCREEN, HOSP PERFORMED  AMMONIA  TSH  LEVETIRACETAM  LEVEL  CBG MONITORING, ED  CBG MONITORING, ED  TROPONIN  I (HIGH SENSITIVITY)    EKG: EKG Interpretation Date/Time:  Saturday August 25 2023 11:48:55 EDT Ventricular Rate:  102 PR Interval:  160 QRS Duration:  113 QT Interval:  345 QTC Calculation: 450 R Axis:   38  Text Interpretation: Sinus tachycardia Inferior ST changes similar to prior EKGs Lateral q waves present Confirmed by Jerrol Agent ([id number]) on 08/25/2023 12:00:27 PM  Radiology: CT HEAD WO CONTRAST Result Date: 08/25/2023 EXAM: CT HEAD WITHOUT 08/25/2023 12:44:15 PM TECHNIQUE: CT of the head was performed without the administration of intravenous contrast. Automated exposure control, iterative reconstruction, and/or weight based  adjustment of the mA/kV was utilized to reduce the radiation dose to as low as reasonably achievable. COMPARISON: None available. CLINICAL HISTORY: Mental status change, unknown cause. Patient found unconscious at home, family reports patient did not sleep well last night. 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 FINDINGS: BRAIN AND VENTRICLES: An acute non-hemorrhagic left PCA (posterior cerebral artery) territory infarct is present. A hyperdense left posterior cerebral artery is present. More chronic encephalomalacia in the left MCA (middle cerebral artery) territory is present. Stable chronic encephalomalacia is present in the high right parietal lobe. Stable moderate diffuse periventricular and subcortical white matter hypoattenuation is present bilaterally. No mass effect or midline shift. No extra-axial fluid collection. Gray-white differentiation is maintained. No hydrocephalus. ORBITS: No acute abnormality. SINUSES AND MASTOIDS: No acute abnormality. SOFT TISSUES AND SKULL: No acute skull fracture. No acute soft tissue abnormality. VASCULATURE: Atherosclerotic calcifications are present in the cavernous carotid arteries bilaterally. No hyperdense vessel is present in the anterior circulation. A hyperdense left posterior  cerebral artery is present. IMPRESSION: 1. Acute non-hemorrhagic left PCA territory infarct with hyperdense left posterior cerebral artery, consistent with vessel occlusion . 2. Chronic encephalomalacia in the left MCA territory and high right parietal lobe. 3. Stable moderate diffuse periventricular and subcortical white matter hypoattenuation bilaterally. Findings were discussed with Dr. Jerrie at 12:55 pm. Electronically signed by: Lonni Necessary MD 08/25/2023 12:57 PM EDT RP Workstation: [id number]     .Critical Care  Performed by: Jerrol Agent, MD Authorized by: Jerrol Agent, MD   Critical care provider statement:    Critical care time (minutes):  30   Critical care was necessary to treat or prevent imminent or life-threatening deterioration of the following conditions:  CNS failure or compromise   Critical care was time spent personally by me on the following activities:  Development of treatment plan with patient or surrogate, discussions with consultants, evaluation of patient's response to treatment, examination of patient, ordering and review of laboratory studies, ordering and review of radiographic studies, ordering and performing treatments and interventions, pulse oximetry, re-evaluation of patient's condition and review of old charts   Care discussed with: admitting provider      Medications Ordered in the ED  levETIRAcetam  (KEPPRA ) undiluted injection 1,500 mg (1,500 mg Intravenous Given 08/25/23 1206)                                    Medical Decision Making Amount and/or Complexity of Data Reviewed Labs: ordered. Radiology: ordered.  Risk Prescription drug management. Decision regarding hospitalization.     53 y.o. 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 The hx was provided by EMS. They state that the patient went to bed last night at his baseline. When family went to arouse him, he was unresponsive. He would not arouse to sternal rub. Family stated that he had not slept well last night. On EMS attempts to transport, the patient became more arousable. He is non verbal at baseline and has right sided hemiparesis at baseline from old strokes. No new deficits per family.  Family: He went to sleep, was very sluggish, couldn't arouse him. Will open eyes then go right back into a somnolent  state. Not at his baseline. No new deficits. Was up late last night into the morning. Ate breakfast, his change in mental status happened around 0930.   On arrival, CBG 99. BP 176/106, RR 18, sats 100% on room air. He arrives with lip abrasions consistent with possible seizure activity. He arrives GCS 8 (2-1-5). Appears somnolent compared to his reported baseline.   Medical Decision Making:   Henry Simmons is a 53 y.o. male who presented to the ED today with altered mental status detailed above.     Complete initial physical exam performed, notably the patient  was GCS 8, somnolent, at his baseline neurologically other than increased lethargy/somnolence.    Reviewed and confirmed nursing documentation for past medical history, family history, social history.    Initial Assessment:   With the patient's presentation of altered mental status, most likely diagnosis is breakthough seizure. Considered delerium 2/2 infectious etiology (UTI/CAP/URI) vs metabolic abnormality (Na/K/Mg/Ca) vs nonspecific etiology. Other diagnoses were considered including (but not limited to) CVA, ICH, intracranial mass, critical dehydration, heptatic dysfunction, uremia, hypercarbia, intoxication, endrocrine abnormality, toxidrome. These are considered less likely due to history of present illness and physical exam findings.   This is most consistent with an  acute life/limb threatening illness complicated by underlying chronic conditions.  Initial Plan:  CTH to evaluate for intracranial etiology of patient's symptoms  Screening labs including CBC and Metabolic panel to evaluate for infectious or metabolic etiology of disease.  Urinalysis with reflex culture ordered to evaluate for UTI or relevant urologic/nephrologic pathology.  CXR to evaluate for structural/infectious intrathoracic pathology.  TSH for evaluation for endrocrine etiology Drug screen for toxidrome evaluation VBG for acid/base status and further toxidrome evaulation EKG to evaluate for cardiac pathology Objective evaluation as below reviewed   Initial Study Results:   Laboratory  All laboratory results reviewed without evidence of clinically relevant pathology.   Exceptions include: VBG 7.42, lactic acid 2.0, CBG 99, CBC without a leukocytosis or anemia, i-STAT Chem-8 with hyperkalemia however CMP with a K of 3.3.  EKG EKG was reviewed independently. Rate, rhythm, axis, intervals all examined and without medically relevant abnormality. ST segments without concerns for elevations.    Radiology:  All images reviewed independently. Agree with radiology report at this time.   CT HEAD WO CONTRAST Result Date: 08/25/2023 EXAM: CT HEAD WITHOUT 08/25/2023 12:44:15 PM TECHNIQUE: CT of the head was performed without the administration of intravenous contrast. Automated exposure control, iterative reconstruction, and/or weight based adjustment of the mA/kV was utilized to reduce the radiation dose to as low as reasonably achievable. COMPARISON: None available. CLINICAL HISTORY: Mental status change, unknown cause. Patient found unconscious at home, family reports patient did not sleep well last night. 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 FINDINGS: BRAIN AND VENTRICLES: An acute non-hemorrhagic left PCA (posterior cerebral artery) territory  infarct is present. A hyperdense left posterior cerebral artery is present. More chronic encephalomalacia in the left MCA (middle cerebral artery) territory is present. Stable chronic encephalomalacia is present in the high right parietal lobe. Stable moderate diffuse periventricular and subcortical white matter hypoattenuation is present bilaterally. No mass effect or midline shift. No extra-axial fluid collection. Gray-white differentiation is maintained. No hydrocephalus. ORBITS: No acute abnormality. SINUSES AND MASTOIDS: No acute abnormality. SOFT TISSUES AND SKULL: No acute skull fracture. No acute soft tissue abnormality. VASCULATURE: Atherosclerotic calcifications are present in the cavernous carotid arteries bilaterally. No hyperdense vessel is present in the anterior  circulation. A hyperdense left posterior cerebral artery is present. IMPRESSION: 1. Acute non-hemorrhagic left PCA territory infarct with hyperdense left posterior cerebral artery, consistent with vessel occlusion . 2. Chronic encephalomalacia in the left MCA territory and high right parietal lobe. 3. Stable moderate diffuse periventricular and subcortical white matter hypoattenuation bilaterally. Findings were discussed with Dr. Jerrie at 12:55 pm. Electronically signed by: Lonni Necessary MD 08/25/2023 12:57 PM EDT RP Workstation: [id number]      Consults: Case discussed with Dr. Karolee, on-call neurology, who reviewed the care plan and agreed with the plan of care.   Final Assessment and Plan:   Plan for admission for stroke management. CTA Head and Neck ordered. Pt GCS at 9 (3-1-5) at time of admission.     Final diagnoses:  Cerebrovascular accident (CVA), unspecified mechanism [facility])    ED Discharge Orders     None          Jerrol Agent, MD 08/25/23 1634    Jerrol Agent, MD 08/25/23 [phone]

## 2023-08-25 NOTE — ED Triage Notes (Signed)
 Pt bibems from home, family reported pt unconscious but states that he did not sleep well last night. Hx of 3 strokes. Complete right sided deficits. Non verbal. No new deficits.  20G in L forearm 176/106 18 RR CBG 146 100% RA

## 2023-08-25 NOTE — ED Notes (Signed)
 Pt transported to CT by CT tech with this RN at bedside monitoring pt's status.

## 2023-08-25 NOTE — ED Notes (Signed)
 Pt transported to CT by this RN. Monitored by RN while in CT.

## 2023-08-25 NOTE — Progress Notes (Signed)
 [facility] night cross cover note:   Regarding this patient who was admitted earlier today with acute cva who has baseline moderate aphasia, right hemiparesis, responds to pain but unable to follow commands, RN notified me of the patient's updated troponin level, which is now 149, up slightly from initial trop of 82 earlier today. In the setting of the above, unable to assess for any associated acute cardiac symptoms at this time. VS appear stable, including afebrile, heart rates in the  90s, systolic pressures in the 150s to 160s mmHg, respiratory rate 15-18, and oxygen saturation 100% on room air.  It appears that the patient has already undergone echocardiogram earlier today.  Will pursue repeat EKG at this time, and continue to trend troponin to peak, with repeat troponin ordered to be checked in the morning.   Update: repeat EKG, in comparison to most recent prior EKG performed around noon today, shows sinus rhythm with heart rate 85, normal intervals, T wave inversion in leads II, III, aVF, and V6, of which the T wave inversion in leads III, aVF, and V6 appear unchanged, also demonstrating less than 1 mm ST depression limited to V4, which now appears less pronounced relative to the ST depression previously noted in V4 on EKG performed around noon today.  This most recent EKG otherwise shows no evidence of ST changes, including no evidence of ST elevation.     Eva Pore, DO Hospitalist

## 2023-08-25 NOTE — ED Notes (Signed)
 MD at bedside.

## 2023-08-25 NOTE — Progress Notes (Signed)
 Routine EEG completed, results pending Neurology review and interpretation

## 2023-08-25 NOTE — Procedures (Signed)
 Patient Name: CHIDUBEM CHAIRES  MRN: [id number]  Epilepsy Attending: Arlin MALVA Krebs  Referring Physician/Provider: Jerrie Lola CROME, MD  Date: 08/25/2023 Duration: 36.09 mins  Patient history: 53 y.o. 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 EEG to evaluate for seizure  Level of alertness: comatose/ lethargic   AEDs during EEG study:   Technical aspects: This EEG study was done with scalp electrodes positioned according to the 10-20 International system of electrode placement. Electrical activity was reviewed with band pass filter of 1-70Hz , sensitivity of 7 uV/mm, display speed of 32mm/sec with a 60Hz  notched filter applied as appropriate. EEG data were recorded continuously and digitally stored.  Video monitoring was available and reviewed as appropriate.  Description: EEG showed continuous generalized predominantly 5 to 7 Hz theta slowing admixed with intermittent 2-3hz  delta slowing. Hyperventilation and photic stimulation were not performed.     ABNORMALITY - Continuous slow, generalized  IMPRESSION: This study is suggestive of moderate diffuse encephalopathy. No seizures or epileptiform discharges were seen throughout the recording.  Judeth Gilles O Jermari Tamargo

## 2023-08-26 ENCOUNTER — Encounter (HOSPITAL_COMMUNITY): Payer: Self-pay | Admitting: Hospitalist

## 2023-08-26 ENCOUNTER — Inpatient Hospital Stay (HOSPITAL_COMMUNITY)

## 2023-08-26 ENCOUNTER — Other Ambulatory Visit: Payer: Self-pay

## 2023-08-26 DIAGNOSIS — I5020 Unspecified systolic (congestive) heart failure: Secondary | ICD-10-CM

## 2023-08-26 DIAGNOSIS — I69391 Dysphagia following cerebral infarction: Secondary | ICD-10-CM | POA: Diagnosis not present

## 2023-08-26 DIAGNOSIS — I63432 Cerebral infarction due to embolism of left posterior cerebral artery: Secondary | ICD-10-CM | POA: Diagnosis not present

## 2023-08-26 DIAGNOSIS — I69351 Hemiplegia and hemiparesis following cerebral infarction affecting right dominant side: Secondary | ICD-10-CM | POA: Diagnosis not present

## 2023-08-26 DIAGNOSIS — I110 Hypertensive heart disease with heart failure: Secondary | ICD-10-CM

## 2023-08-26 DIAGNOSIS — I69320 Aphasia following cerebral infarction: Secondary | ICD-10-CM | POA: Diagnosis not present

## 2023-08-26 DIAGNOSIS — I779 Disorder of arteries and arterioles, unspecified: Secondary | ICD-10-CM | POA: Diagnosis not present

## 2023-08-26 DIAGNOSIS — I6389 Other cerebral infarction: Secondary | ICD-10-CM | POA: Diagnosis not present

## 2023-08-26 LAB — TROPONIN I (HIGH SENSITIVITY): Troponin I (High Sensitivity): 124 ng/L (ref <18)

## 2023-08-26 LAB — BLOOD CULTURE ID PANEL (REFLEXED) - BCID2

## 2023-08-26 LAB — LIPID PANEL
Cholesterol: 102 mg/dL (ref 0–200)
HDL: 23 mg/dL — ABNORMAL LOW (ref >40)
LDL Cholesterol: 68 mg/dL (ref 0–99)
Total CHOL/HDL Ratio: 4.4 ratio
Triglycerides: 55 mg/dL (ref <150)
VLDL: 11 mg/dL (ref 0–40)

## 2023-08-26 LAB — BASIC METABOLIC PANEL WITH GFR
Anion gap: 16 — ABNORMAL HIGH (ref 5–15)
BUN: 14 mg/dL (ref 6–20)
CO2: 18 mmol/L — ABNORMAL LOW (ref 22–32)
Calcium: 9.5 mg/dL (ref 8.9–10.3)
Chloride: 109 mmol/L (ref 98–111)
Creatinine, Ser: 1.11 mg/dL (ref 0.61–1.24)
GFR, Estimated: >60 mL/min (ref >60)
Glucose, Bld: 104 mg/dL — ABNORMAL HIGH (ref 70–99)
Potassium: 3.9 mmol/L (ref 3.5–5.1)
Sodium: 143 mmol/L (ref 135–145)

## 2023-08-26 MED ORDER — ENOXAPARIN SODIUM 40 MG/0.4ML IJ SOSY
40.0000 mg | PREFILLED_SYRINGE | INTRAMUSCULAR | Status: DC
  Administered 2023-08-26 – 2023-08-29 (×4): 40 mg via SUBCUTANEOUS
  Filled 2023-08-26 (×4): qty 0.4

## 2023-08-26 MED ORDER — ORAL CARE MOUTH RINSE
15.0000 mL | OROMUCOSAL | Status: DC
  Administered 2023-08-26 – 2023-09-08 (×49): 15 mL via OROMUCOSAL

## 2023-08-26 MED ORDER — LACTATED RINGERS IV SOLN: INTRAVENOUS | Status: AC

## 2023-08-26 MED ORDER — ORAL CARE MOUTH RINSE: 15.0000 mL | OROMUCOSAL | Status: DC | PRN

## 2023-08-26 MED ORDER — LACTATED RINGERS IV SOLN: INTRAVENOUS | Status: DC

## 2023-08-26 NOTE — Progress Notes (Signed)
 Inpatient Rehab Admissions Coordinator Note:   Per therapy patient was screened for CIR candidacy by Avanni Turnbaugh SHAUNNA Yvone Cohens, CCC-SLP. At this time, pt has not yet attempted transfers. Noted decreased alertness with therapy. Pt may have potential to progress to becoming a potential CIR candidate. CIR admissions team will follow to monitor for progress and participation with therapies. A consult order will be placed if pt appears to be an appropriate candidate.     Tinnie Yvone Cohens, MS, CCC-SLP Admissions Coordinator [phone] 08/26/23 5:12 PM

## 2023-08-26 NOTE — Progress Notes (Signed)
   08/26/23 0951  Urine Measurement/Characteristics  Bladder Scan Volume (mL) 425 mL

## 2023-08-26 NOTE — Progress Notes (Signed)
   08/26/23 1809  Urine Measurement/Characteristics  Bladder Scan Volume (mL) 192 mL

## 2023-08-26 NOTE — Progress Notes (Addendum)
 PROGRESS NOTE    Henry Simmons [facility]  FMW:[id number] DOB: 25-Mar-1970 DOA: 08/25/2023 PCP: Henry Pries, NP   Brief Narrative: 53 year old with past medical history significant for CAD status post PCI heart failure moderate reduced ejection fraction, hypertension, hyperlipidemia, seizure disorder, history of left MCA stroke with right hemiparesis at baseline and moderate aphasia present with new left PCA stroke.  Presented obtunded on admission.  Wife provided history.  Patient ate breakfast, got dressed, around 8:30 in the morning he fell asleep which is not normal for him.  Wife attempted to wake him up and he was unarousable.  Activated EMS.  She denies any seizure-like activity.  Evaluation in the ED lactic acid 2, troponin 82, potassium 6.1, TSH 0.34.  Chest x-ray within normal limits.  CT head showed occlusion of the left posterior cerebral artery at its origin and moderate to severe stenosis A1 A2 segment left anterior cerebral artery.  Neurology was consulted admitted for further evaluation of a stroke.   Assessment & Plan:   Principal Problem:   CVA (cerebral vascular accident) (HCC)   Left PCA stroke: Acute -Patient present with lethargy, nonverbal, not following commands - CT head without contrast acute nonhemorrhagic left PCA territory infarct with hyperdense left posterior cerebral artery consistent with vessel occlusion.  Chronic encephalomalacia of the left MCA territory and right parietal lobe. - CT angio head and neck: Occlusion of the left posterior cerebral artery at its origin.  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  Moderate to severe stenosis A1 A2, moderate stenosis M2 2D echo ejection fraction 30 to 35%, left posterior basal unusual thickening with definitive aberrant papillary muscle and core, wall motion abnormality -LDL 68, A1c 5.4 -Neurology recommend aspirin  325 when able to swallow anticipate dual antiplatelets  treatment -Plan for core track tomorrow -Neurology patient will likely need PEG tube, will need goals of care discussion.   Palliative care consulted, discussed with Wife.   History of seizure disorder: - He was loaded with 1.5 g of Keppra  Continue Keppra  250 mg twice daily  Lactic acidosis: Start low rate IV fluids monitor for pulmonary edema in the setting of low ejection fraction  CAD status post PCI: Chronic systolic heart failure ejection fraction 35% Elevated troponin: Echo with worsening ejection fraction 35%, generalized wall motion abnormality. Cardiology has been consulted.  Hypertension:  - Permissive hypertension Labetalol  as needed for systolic blood pressure more than 220  Hyperlipidemia: Resume statin when taking orals  Seizure disorder: - Patient was loaded with IV Keppra  -Continue IV Keppra  250 twice daily.   Estimated body mass index is 22.65 kg/m as calculated from the following:   Height as of 08/14/23: 6' 2 (1.88 m).   Weight as of 08/14/23: 80 kg.   DVT prophylaxis: Start Lovenox  Code Status: Full code Family Communication: Care discussed with wife Disposition Plan:  Status is: Inpatient Remains inpatient appropriate because: management     Consultants:  Neurology   Procedures:  ECHO  Antimicrobials:    Subjective: Non responsive, per family he moves hands at time. He try to reposition.   Objective: Vitals:   08/25/23 1945 08/25/23 2342 08/26/23 0340 08/26/23 0740  BP: (!) 164/110 (!) 148/103 (!) 139/102 (!) 144/113  Pulse: 94     Resp: 15 16    Temp:  99.9 F (37.7 C) 99 F (37.2 C) 98.4 F (36.9 C)  TempSrc:  Axillary Axillary Oral  SpO2: 100% 96%  Intake/Output Summary (Last 24 hours) at 08/26/2023 0800 Last data filed at 08/26/2023 0725 Gross per 24 hour  Intake 2.5 ml  Output --  Net 2.5 ml   There were no vitals filed for this visit.  Examination:  General exam: NAD Respiratory system: Clear to auscultation.  Respiratory effort normal. Cardiovascular system: S1 & S2 heard, RRR. No JVD, murmurs, rubs, gallops or clicks. No pedal edema. Gastrointestinal system: Abdomen is nondistended, soft and nontender. No organomegaly or masses felt. Normal bowel sounds heard. Central nervous system: lethargic, does not follows command Extremities: no edema   Data Reviewed: I have personally reviewed following labs and imaging studies  CBC: Recent Labs  Lab 08/25/23 1251 08/25/23 1306  WBC 6.4  --   NEUTROABS 4.3  --   HGB 14.3 15.0  15.3  HCT 45.2 44.0  45.0  MCV 87.8  --   PLT 166  --    Basic Metabolic Panel: Recent Labs  Lab 08/25/23 1306 08/25/23 1435  NA 141  141 140  K 6.0*  6.1* 3.3*  CL 106 100  CO2  --  32  GLUCOSE 105* 101*  BUN 16 10  CREATININE 1.10 1.03  CALCIUM   --  9.6   GFR: Estimated Creatinine Clearance: 92.8 mL/min (by C-G formula based on SCr of 1.03 mg/dL). Liver Function Tests: Recent Labs  Lab 08/25/23 1435  AST 30  ALT 59*  ALKPHOS 62  BILITOT 1.2  PROT 6.8  ALBUMIN 3.9   No results for input(s): LIPASE, AMYLASE in the last 168 hours. Recent Labs  Lab 08/25/23 1540  AMMONIA 14   Coagulation Profile: No results for input(s): INR, PROTIME in the last 168 hours. Cardiac Enzymes: No results for input(s): CKTOTAL, CKMB, CKMBINDEX, TROPONINI in the last 168 hours. BNP (last 3 results) No results for input(s): PROBNP in the last 8760 hours. HbA1C: No results for input(s): HGBA1C in the last 72 hours. CBG: Recent Labs  Lab 08/25/23 1152  GLUCAP 99   Lipid Profile: No results for input(s): CHOL, HDL, LDLCALC, TRIG, CHOLHDL, LDLDIRECT in the last 72 hours. Thyroid Function Tests: Recent Labs    08/25/23 1253  TSH 0.346*   Anemia Panel: No results for input(s): VITAMINB12, FOLATE, FERRITIN, TIBC, IRON, RETICCTPCT in the last 72 hours. Sepsis Labs: Recent Labs  Lab 08/25/23 1307 08/25/23 1908   LATICACIDVEN 2.0* 1.6    No results found for this or any previous visit (from the past 240 hours).       Radiology Studies: ECHOCARDIOGRAM COMPLETE Result Date: 08/25/2023    ECHOCARDIOGRAM REPORT   Patient Name:   Henry Simmons Date of Exam: 08/25/2023 Medical Rec #:  [id number]      Height:       74.0 in Accession #:    [id number]     Weight:       176.4 lb Date of Birth:  [birth year]      BSA:          2.061 m Patient Age:    52 years       BP:           199/113 mmHg Patient Gender: M              HR:           96 bpm. Exam Location:  Inpatient Procedure: 2D Echo (Both Spectral and Color Flow Doppler were utilized during            procedure). Indications:  stroke  History:        Patient has prior history of Echocardiogram examinations, most                 recent 06/07/2021. Cardiomyopathy, CAD, chronic kidney disease;                 Risk Factors:Hypertension and Dyslipidemia.  Sonographer:    Tinnie Barefoot RDCS Referring Phys: [id number] SRISHTI L BHAGAT IMPRESSIONS  1. Unusual thickening fo LV posterior basal wall with definity  ? aberrant papillary muscle and chords.  2. Hyperechoic myocardium clinical correlation suggested to r/o amyloid . Left ventricular ejection fraction, by estimation, is 30 to 35%. The left ventricle has moderately decreased function. The left ventricle demonstrates global hypokinesis. The left  ventricular internal cavity size was moderately dilated. There is moderate left ventricular hypertrophy. Left ventricular diastolic parameters are indeterminate.  3. Right ventricular systolic function is normal. The right ventricular size is normal.  4. The mitral valve is abnormal. Trivial mitral valve regurgitation. No evidence of mitral stenosis.  5. The aortic valve is tricuspid. Aortic valve regurgitation is not visualized. No aortic stenosis is present.  6. The inferior vena cava is normal in size with greater than 50% respiratory variability, suggesting right atrial  pressure of 3 mmHg. FINDINGS  Left Ventricle: Hyperechoic myocardium clinical correlation suggested to r/o amyloid. Left ventricular ejection fraction, by estimation, is 30 to 35%. The left ventricle has moderately decreased function. The left ventricle demonstrates global hypokinesis. Definity  contrast agent was given IV to delineate the left ventricular endocardial borders. Strain was performed and the global longitudinal strain is indeterminate. The left ventricular internal cavity size was moderately dilated. There is moderate left ventricular hypertrophy. Left ventricular diastolic parameters are indeterminate. Right Ventricle: The right ventricular size is normal. No increase in right ventricular wall thickness. Right ventricular systolic function is normal. Left Atrium: Left atrial size was normal in size. Right Atrium: Right atrial size was normal in size. Pericardium: Trivial pericardial effusion is present. The pericardial effusion is posterior to the left ventricle. Mitral Valve: The mitral valve is abnormal. There is mild thickening of the mitral valve leaflet(s). Trivial mitral valve regurgitation. No evidence of mitral valve stenosis. Tricuspid Valve: The tricuspid valve is normal in structure. Tricuspid valve regurgitation is mild . No evidence of tricuspid stenosis. Aortic Valve: The aortic valve is tricuspid. Aortic valve regurgitation is not visualized. No aortic stenosis is present. Pulmonic Valve: The pulmonic valve was normal in structure. Pulmonic valve regurgitation is not visualized. No evidence of pulmonic stenosis. Aorta: The aortic root is normal in size and structure. Venous: The inferior vena cava is normal in size with greater than 50% respiratory variability, suggesting right atrial pressure of 3 mmHg. IAS/Shunts: No atrial level shunt detected by color flow Doppler. Additional Comments: Unusual thickening fo LV posterior basal wall with definity  ? aberrant papillary muscle and chords.  3D was performed not requiring image post processing on an independent workstation and was indeterminate.  LEFT VENTRICLE PLAX 2D LVIDd:         5.10 cm      Diastology LVIDs:         4.20 cm      LV e' medial:  6.09 cm/s LV PW:         1.40 cm      LV e' lateral: 4.24 cm/s LV IVS:        1.30 cm LVOT diam:     2.50 cm LV SV:  59 LV SV Index:   29 LVOT Area:     4.91 cm  LV Volumes (MOD) LV vol d, MOD A2C: 119.0 ml LV vol d, MOD A4C: 123.0 ml LV vol s, MOD A2C: 72.2 ml LV vol s, MOD A4C: 106.0 ml LV SV MOD A2C:     46.8 ml LV SV MOD A4C:     123.0 ml LV SV MOD BP:      31.7 ml RIGHT VENTRICLE RV S prime:     17.50 cm/s TAPSE (M-mode): 1.9 cm LEFT ATRIUM             Index LA diam:        3.00 cm 1.46 cm/m LA Vol (A2C):   47.2 ml 22.91 ml/m LA Vol (A4C):   26.9 ml 13.05 ml/m LA Biplane Vol: 35.5 ml 17.23 ml/m  AORTIC VALVE LVOT Vmax:   77.20 cm/s LVOT Vmean:  50.600 cm/s LVOT VTI:    0.120 m  AORTA Ao Asc diam: 3.70 cm  SHUNTS Systemic VTI:  0.12 m Systemic Diam: 2.50 cm Maude Emmer MD Electronically signed by Maude Emmer MD Signature Date/Time: 08/25/2023/6:13:36 PM    Final    EEG adult Result Date: 08/25/2023 Shelton Arlin KIDD, MD     08/25/2023  3:09 PM Patient Name: Henry Simmons MRN: [id number] Epilepsy Attending: Arlin KIDD Shelton Referring Physician/Provider: Jerrie Lola CROME, MD Date: 08/25/2023 Duration: 36.09 mins Patient history: 53 y.o. 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 EEG to evaluate for seizure Level of alertness: comatose/ lethargic AEDs during EEG study: Technical aspects: This EEG study was done with scalp electrodes positioned according to the 10-20 International system of electrode placement. Electrical activity was reviewed with band pass filter of 1-70Hz ,  sensitivity of 7 uV/mm, display speed of 55mm/sec with a 60Hz  notched filter applied as appropriate. EEG data were recorded continuously and digitally stored.  Video monitoring was available and reviewed as appropriate. Description: EEG showed continuous generalized predominantly 5 to 7 Hz theta slowing admixed with intermittent 2-3hz  delta slowing. Hyperventilation and photic stimulation were not performed.   ABNORMALITY - Continuous slow, generalized IMPRESSION: This study is suggestive of moderate diffuse encephalopathy. No seizures or epileptiform discharges were seen throughout the recording. Priyanka KIDD Shelton   CT ANGIO HEAD NECK W WO CM (CODE STROKE) Result Date: 08/25/2023 CLINICAL DATA:  Neuro deficit, acute, stroke suspected EXAM: CT ANGIOGRAPHY HEAD AND NECK WITH AND WITHOUT CONTRAST TECHNIQUE: Multidetector CT imaging of the head and neck was performed using the standard protocol during bolus administration of intravenous contrast. Multiplanar CT image reconstructions and MIPs were obtained to evaluate the vascular anatomy. Carotid stenosis measurements (when applicable) are obtained utilizing NASCET criteria, using the distal internal carotid diameter as the denominator. RADIATION DOSE REDUCTION: This exam was performed according to the departmental dose-optimization program which includes automated exposure control, adjustment of the mA and/or kV according to patient size and/or use of iterative reconstruction technique. CONTRAST:  75mL OMNIPAQUE  IOHEXOL  350 MG/ML SOLN COMPARISON:  CT the head dated August 25, 2023 and CT angiogram of the head and neck dated December 10, 2022. FINDINGS: CTA NECK FINDINGS Aortic arch: Normal. Three vessel takeoff the great arteries. Brachiocephalic artery is widely patent. Right carotid system: Common carotid artery is mildly tortuous but widely patent. The internal carotid  artery is tortuous and takes a retropharyngeal course, but is normal in caliber throughout the  neck. Left carotid system: Common carotid arteries normal in caliber and unremarkable. The internal carotid artery is tortuous, but normal in caliber. Vertebral arteries: The left vertebral artery is dominant and normal in caliber throughout the neck. The right vertebral artery is relatively hypoplastic. There is mild to moderate stenosis in the proximal V2 segment there is near occlusive stenosis in the distal V4 segment. Skeleton: Multilevel degenerative disc disease.  No osseous lesions. Other neck: Negative. Upper chest: The visualized lung apices are clear. Review of the MIP images confirms the above findings CTA HEAD FINDINGS Anterior circulation: Mild to moderate stenosis of the ophthalmic and communicating segments of the right internal carotid artery. There is mild stenosis of the left ophthalmic segment and fusiform dilatation of the ICA terminus. There is severe stenosis of the left A1 segment. There is also severe stenosis of the left A2 segment. There is moderate stenosis of the proximal right M1 segment. There is high-grade stenosis of this M2 sylvian branch of the right middle cerebral artery. Posterior circulation: There is no occlusion of the left posterior cerebral artery at its origin. There is mild to moderate stenosis of the proximal basilar artery, similar to the prior study. There is near occlusive stenosis of the distal V4 segment of the right vertebral artery. The left V4 segment is widely patent. The cerebellar arteries are patent. Venous sinuses: Patent. Anatomic variants: None. Review of the MIP images confirms the above findings IMPRESSION: 1. 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 2. Mild-to-moderate stenosis of the basilar artery and near occlusive stenosis of the distal V4 segment of the right vertebral artery. 3. Moderate to severe stenosis of the A1 and A2 segments of the left anterior cerebral artery. 4. Moderate stenoses of the proximal M1 segment of the  right middle cerebral artery and moderate stenosis of the M2 segment. 5. Tortuous cervical segments of the internal carotid arteries without flow-limiting stenosis. These results were called by telephone at the time of interpretation on 08/25/2023 at 2:17 pm to provider Dr. LOLA JERNIGAN , who verbally acknowledged these results. Electronically Signed   By: Evalene Coho M.D.   On: 08/25/2023 14:29   DG Chest Port 1 View Result Date: 08/25/2023 CLINICAL DATA:  Altered mental status. EXAM: PORTABLE CHEST 1 VIEW COMPARISON:  06/07/2021 FINDINGS: Improved inspiration. The cardiac silhouette remains mildly enlarged. The femoral pacemaker has been removed. Clear lungs with normal vascularity. Unremarkable bones. IMPRESSION: No acute abnormality. Electronically Signed   By: Elspeth Bathe M.D.   On: 08/25/2023 13:39   CT HEAD WO CONTRAST Result Date: 08/25/2023 EXAM: CT HEAD WITHOUT 08/25/2023 12:44:15 PM TECHNIQUE: CT of the head was performed without the administration of intravenous contrast. Automated exposure control, iterative reconstruction, and/or weight based adjustment of the mA/kV was utilized to reduce the radiation dose to as low as reasonably achievable. COMPARISON: None available. CLINICAL HISTORY: Mental status change, unknown cause. Patient found unconscious at home, family reports patient did not sleep well last night. 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 FINDINGS: BRAIN AND VENTRICLES: An acute non-hemorrhagic left PCA (posterior cerebral artery) territory infarct is present. A hyperdense left posterior cerebral artery is present. More chronic encephalomalacia in the left MCA (middle cerebral artery) territory is present. Stable chronic encephalomalacia is present in the high right parietal lobe. Stable moderate diffuse periventricular and subcortical white matter  hypoattenuation is present bilaterally. No mass effect or midline shift. No  extra-axial fluid collection. Gray-white differentiation is maintained. No hydrocephalus. ORBITS: No acute abnormality. SINUSES AND MASTOIDS: No acute abnormality. SOFT TISSUES AND SKULL: No acute skull fracture. No acute soft tissue abnormality. VASCULATURE: Atherosclerotic calcifications are present in the cavernous carotid arteries bilaterally. No hyperdense vessel is present in the anterior circulation. A hyperdense left posterior cerebral artery is present. IMPRESSION: 1. Acute non-hemorrhagic left PCA territory infarct with hyperdense left posterior cerebral artery, consistent with vessel occlusion . 2. Chronic encephalomalacia in the left MCA territory and high right parietal lobe. 3. Stable moderate diffuse periventricular and subcortical white matter hypoattenuation bilaterally. Findings were discussed with Dr. Jerrie at 12:55 pm. Electronically signed by: Lonni Necessary MD 08/25/2023 12:57 PM EDT RP Workstation: [id number]        Scheduled Meds:   stroke: early stages of recovery book   Does not apply Once   aspirin   325 mg Oral Daily   Or   aspirin   300 mg Rectal Daily   atorvastatin   80 mg Oral Daily   levETIRAcetam   250 mg Intravenous Q12H   pantoprazole   40 mg Oral Daily   Continuous Infusions:   LOS: 1 day    Time spent: 35 minutes    Fisher Hargadon A Gail Creekmore, MD [facility]   If 7PM-7AM, please contact night-coverage [email or web address]  08/26/2023, 8:00 AM

## 2023-08-26 NOTE — Evaluation (Addendum)
 Physical Therapy Evaluation Patient Details Name: Henry Simmons MRN: [id number] DOB: 1970/07/11 Today's Date: 08/26/2023  History of Present Illness  53 y.o. male presents to [facility] 08/25/23  with worsening level of consciousness. Pt with L PCA CVA and moderate diffuse encephalopathy.PMHx: CAD status post PCI, HFmrEF, hypertension, hyperlipidemia, seizure disorder, and history of left MCA stroke with right hemiparesis   Clinical Impression  Pt had decreased level of alertness in today's session and was unable to arouse with tactile stimulation or movement. Per family, pt had assist at home to ambulate short distances with L hemiwalker and would use a WC in the community. At baseline, pt would respond to questions with a few words but was unable to speak in sentences. In today's session, pt was TotalAx2 for all mobility due to decreased level of alertness. When rolling, noted slight spontaneous movement in L extremities. Anticipate pt will progress well in future session with increased alertness. Pt has very strong 24/7 family support available at home. Recommending post-acute rehab >3hrs to maximize rehab potential and decrease caregiver burden. Pt would benefit from acute skilled PT with current functional limitations listed below (see PT Problem List). Acute PT to follow.  BP 155/109, 100 BPM 100% SpO2 on RA         If plan is discharge home, recommend the following: A lot of help with walking and/or transfers;A lot of help with bathing/dressing/bathroom;Assistance with cooking/housework;Assist for transportation;Help with stairs or ramp for entrance   Can travel by private vehicle    No    Equipment Recommendations None recommended by PT  Recommendations for Other Services  Rehab consult    Functional Status Assessment Patient has had a recent decline in their functional status and demonstrates the ability to make significant improvements in function in a reasonable and predictable amount  of time.     Precautions / Restrictions Precautions Precautions: Fall Recall of Precautions/Restrictions: Impaired Precaution/Restrictions Comments: R hemiparesis at baseline Restrictions Weight Bearing Restrictions Per Provider Order: No      Mobility  Bed Mobility Overal bed mobility: Needs Assistance Bed Mobility: Sit to Supine, Supine to Sit, Rolling Rolling: Total assist, +2 for safety/equipment   Supine to sit: Total assist, +2 for physical assistance, +2 for safety/equipment Sit to supine: Total assist, +2 for physical assistance, +2 for safety/equipment   General bed mobility comments: TotalAx2 for all aspects of mobility due to decreased LOA    Transfers    General transfer comment: deferred   Modified Rankin (Stroke Patients Only) Modified Rankin (Stroke Patients Only) Pre-Morbid Rankin Score: Moderately severe disability Modified Rankin: Severe disability     Balance Overall balance assessment: Needs assistance, Mild deficits observed, not formally tested Sitting-balance support: No upper extremity supported, Feet supported Sitting balance-Leahy Scale: Zero Sitting balance - Comments: unable to maintain seated balance Postural control: Posterior lean          Pertinent Vitals/Pain Pain Assessment Pain Assessment: PAINAD Breathing: normal Negative Vocalization: none Facial Expression: smiling or inexpressive Body Language: relaxed Consolability: no need to console PAINAD Score: 0    Home Living Family/patient expects to be discharged to:: Private residence Living Arrangements: Spouse/significant other Available Help at Discharge: Family;Available 24 hours/day Type of Home: House Home Access: Ramped entrance    Home Layout: One level Home Equipment: Grab bars - tub/shower;BSC/3in1;Wheelchair - manual;Hospital bed      Prior Function Prior Level of Function : Needs assist    Mobility Comments: close guard with hemiwalker in the home and  WC  for longer distances. Can steer with L UE ADLs Comments: Assist from wife to complete ADLs. Does bird baths and feeds using L UE.  Patient can perform pant management while standing, assists with grooming and UB ADL     Extremity/Trunk Assessment   Upper Extremity Assessment Upper Extremity Assessment: Defer to OT evaluation    Lower Extremity Assessment Lower Extremity Assessment: Difficult to assess due to impaired cognition;RLE deficits/detail RLE Deficits / Details: history of R hemiparesis, unable to fully assess due to decreased level of alertness    Cervical / Trunk Assessment Cervical / Trunk Assessment: Normal  Communication   Communication Communication: Impaired Factors Affecting Communication: Difficulty expressing self;Reduced clarity of speech    Cognition Arousal: Stuporous Behavior During Therapy:  (unable to arouse)   PT - Cognitive impairments: Difficult to assess Difficult to assess due to: Level of arousal    PT - Cognition Comments: unable to arouse with occasional slight spontaneous movement noted in L extremities when rolling Following commands: Impaired Following commands impaired: Follows one step commands inconsistently     Cueing Cueing Techniques: Verbal cues, Tactile cues     General Comments General comments (skin integrity, edema, etc.): Family present and supportive during session. Answered home set-up and prior level of function     PT Assessment Patient needs continued PT services  PT Problem List Decreased strength;Decreased range of motion;Decreased activity tolerance;Decreased balance;Decreased mobility;Decreased coordination;Decreased cognition;Decreased knowledge of use of DME;Decreased safety awareness       PT Treatment Interventions DME instruction;Gait training;Therapeutic activities;Functional mobility training;Therapeutic exercise;Balance training;Neuromuscular re-education;Patient/family education;Wheelchair mobility training     PT Goals (Current goals can be found in the Care Plan section)  Acute Rehab PT Goals Patient Stated Goal: unable to state goal PT Goal Formulation: Patient unable to participate in goal setting Time For Goal Achievement: 09/09/23 Potential to Achieve Goals: Good    Frequency Min 2X/week     Co-evaluation PT/OT/SLP Co-Evaluation/Treatment: Yes Reason for Co-Treatment: Necessary to address cognition/behavior during functional activity;For patient/therapist safety;To address functional/ADL transfers PT goals addressed during session: Mobility/safety with mobility;Balance;Proper use of DME         AM-PAC PT 6 Clicks Mobility  Outcome Measure Help needed turning from your back to your side while in a flat bed without using bedrails?: Total Help needed moving from lying on your back to sitting on the side of a flat bed without using bedrails?: Total Help needed moving to and from a bed to a chair (including a wheelchair)?: Total Help needed standing up from a chair using your arms (e.g., wheelchair or bedside chair)?: Total Help needed to walk in hospital room?: Total Help needed climbing 3-5 steps with a railing? : Total 6 Click Score: 6    End of Session   Activity Tolerance: Patient limited by fatigue;Patient limited by lethargy;Other (comment) (decreased LOA) Patient left: in bed;with call bell/phone within reach;with family/visitor present;with SCD's reapplied Nurse Communication: Mobility status;Other (comment) (in room at end of session) PT Visit Diagnosis: Unsteadiness on feet (R26.81);Other abnormalities of gait and mobility (R26.89);Muscle weakness (generalized) (M62.81);Hemiplegia and hemiparesis Hemiplegia - Right/Left: Right Hemiplegia - caused by: Cerebral infarction    Time: 8741-8679 PT Time Calculation (min) (ACUTE ONLY): 22 min   Charges:   PT Evaluation $PT Eval Moderate Complexity: 1 Mod   PT General Charges $$ ACUTE PT VISIT: 1 Visit       Kate ORN, PT, DPT Secure Chat Preferred  Rehab Office [phone]   Kate BRAVO  Wendolyn 08/26/2023, 1:32 PM

## 2023-08-26 NOTE — Progress Notes (Signed)
 PHARMACY - PHYSICIAN COMMUNICATION CRITICAL VALUE ALERT - BLOOD CULTURE IDENTIFICATION (BCID)  ABDULMALIK DARCO is an 53 y.o. male who presented to [facility] on 08/25/2023 with a chief complaint of CVA  Assessment:   1/2 blood cultures growing Staphylococcus species, has been afebrile and without leukocytosis--likely contaminant  Name of physician (or Provider) Contacted:  Dr. Marcene   Current antibiotics:  None  Changes to prescribed antibiotics recommended:  No changes at this time  Results for orders placed or performed during the hospital encounter of 08/25/23  Blood Culture ID Panel (Reflexed) (Collected: 08/25/2023  7:08 PM)  Result Value Ref Range   Enterococcus faecalis NOT DETECTED NOT DETECTED   Enterococcus Faecium NOT DETECTED NOT DETECTED   Listeria monocytogenes NOT DETECTED NOT DETECTED   Staphylococcus species DETECTED (A) NOT DETECTED   Staphylococcus aureus (BCID) NOT DETECTED NOT DETECTED   Staphylococcus epidermidis NOT DETECTED NOT DETECTED   Staphylococcus lugdunensis NOT DETECTED NOT DETECTED   Streptococcus species NOT DETECTED NOT DETECTED   Streptococcus agalactiae NOT DETECTED NOT DETECTED   Streptococcus pneumoniae NOT DETECTED NOT DETECTED   Streptococcus pyogenes NOT DETECTED NOT DETECTED   A.calcoaceticus-baumannii NOT DETECTED NOT DETECTED   Bacteroides fragilis NOT DETECTED NOT DETECTED   Enterobacterales NOT DETECTED NOT DETECTED   Enterobacter cloacae complex NOT DETECTED NOT DETECTED   Escherichia coli NOT DETECTED NOT DETECTED   Klebsiella aerogenes NOT DETECTED NOT DETECTED   Klebsiella oxytoca NOT DETECTED NOT DETECTED   Klebsiella pneumoniae NOT DETECTED NOT DETECTED   Proteus species NOT DETECTED NOT DETECTED   Salmonella species NOT DETECTED NOT DETECTED   Serratia marcescens NOT DETECTED NOT DETECTED   Haemophilus influenzae NOT DETECTED NOT DETECTED   Neisseria meningitidis NOT DETECTED NOT DETECTED   Pseudomonas aeruginosa NOT  DETECTED NOT DETECTED   Stenotrophomonas maltophilia NOT DETECTED NOT DETECTED   Candida albicans NOT DETECTED NOT DETECTED   Candida auris NOT DETECTED NOT DETECTED   Candida glabrata NOT DETECTED NOT DETECTED   Candida krusei NOT DETECTED NOT DETECTED   Candida parapsilosis NOT DETECTED NOT DETECTED   Candida tropicalis NOT DETECTED NOT DETECTED   Cryptococcus neoformans/gattii NOT DETECTED NOT DETECTED    Dail Cordella Misty 08/26/2023  11:47 PM

## 2023-08-26 NOTE — Progress Notes (Signed)
 [facility] night cross cover note:   I was notified by the patient's RN that the patient has had no urine output overnight.  Bladder scan at this time shows 290 cc, but without evidence of bladder distention.  I ordered a repeat bladder scan to occur later this morning to continue to monitor.    Eva Pore, DO Hospitalist

## 2023-08-26 NOTE — Progress Notes (Signed)
   08/26/23 1322  Unmeasured Output  Stool Occurrence 1  Urine Measurement/Characteristics  Bladder Scan Volume (mL) 128 mL

## 2023-08-26 NOTE — Plan of Care (Signed)
  Problem: Ischemic Stroke/TIA Tissue Perfusion: Goal: Complications of ischemic stroke/TIA will be minimized Outcome: Progressing   Problem: Health Behavior/Discharge Planning: Goal: Ability to manage health-related needs will improve Outcome: Progressing   Problem: Self-Care: Goal: Ability to communicate needs accurately will improve Outcome: Progressing   Problem: Clinical Measurements: Goal: Will remain free from infection Outcome: Progressing   Problem: Activity: Goal: Risk for activity intolerance will decrease Outcome: Progressing   Problem: Nutrition: Goal: Adequate nutrition will be maintained Outcome: Progressing   Problem: Elimination: Goal: Will not experience complications related to urinary retention Outcome: Progressing   Problem: Safety: Goal: Ability to remain free from injury will improve Outcome: Progressing   Problem: Skin Integrity: Goal: Risk for impaired skin integrity will decrease Outcome: Progressing

## 2023-08-26 NOTE — Progress Notes (Signed)
 SLP Cancellation Note  Patient Details Name: MAURIE OLESEN MRN: [id number] DOB: 02/24/1970   Cancelled treatment:       Reason Eval/Treat Not Completed: Medical issues which prohibited therapy  ST will continue efforts.   Eleanor Eagles, MA, CCC-SLP Acute Rehab SLP [phone]  Eleanor LOISE Eagles 08/26/2023, 6:21 AM

## 2023-08-26 NOTE — Consult Note (Signed)
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 Unremarkable bones. IMPRESSION: No acute abnormality. Electronically Signed   By: Elspeth Bathe M.D.   On: 08/25/2023 13:39   CT HEAD WO CONTRAST Result Date: 08/25/2023 EXAM: CT HEAD WITHOUT 08/25/2023 12:44:15 PM TECHNIQUE: CT of the head was performed without the administration of intravenous contrast. Automated exposure control, iterative reconstruction, and/or weight based adjustment of the mA/kV was utilized to reduce the radiation dose to as low as reasonably achievable. COMPARISON: None available. CLINICAL HISTORY: Mental status change, unknown cause. Patient found unconscious at home, family reports patient did not sleep well last night. 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 FINDINGS: BRAIN AND VENTRICLES: An acute non-hemorrhagic left PCA (posterior cerebral artery) territory infarct is present. A hyperdense left posterior cerebral artery is present. More chronic encephalomalacia in the left MCA (middle cerebral artery) territory  is present. Stable chronic encephalomalacia is present in the high right parietal lobe. Stable moderate diffuse periventricular and subcortical white matter hypoattenuation is present bilaterally. No mass effect or midline shift. No extra-axial fluid collection. Gray-white differentiation is maintained. No hydrocephalus. ORBITS: No acute abnormality. SINUSES AND MASTOIDS: No acute abnormality. SOFT TISSUES AND SKULL: No acute skull fracture. No acute soft tissue abnormality. VASCULATURE: Atherosclerotic calcifications are present in the cavernous carotid arteries bilaterally. No hyperdense vessel is present in the anterior circulation. A hyperdense  left posterior cerebral artery is present. IMPRESSION: 1. Acute non-hemorrhagic left PCA territory infarct with hyperdense left posterior cerebral artery, consistent with vessel occlusion . 2. Chronic encephalomalacia in the left MCA territory and high right parietal lobe. 3. Stable moderate diffuse periventricular and subcortical white matter hypoattenuation bilaterally. Findings were discussed with Dr. Jerrie at 12:55 pm. Electronically signed by: Lonni Necessary MD 08/25/2023 12:57 PM EDT RP Workstation: [id number]    Review of Systems  Unable to perform ROS: Patient nonverbal   Blood pressure (!) 157/120, pulse 96, temperature 98.6 F (37 C), temperature source Axillary, resp. rate 14, SpO2 99%. Physical Exam Constitutional:      Appearance: He is normal weight.  HENT:     Head: Normocephalic and atraumatic.  Eyes:     Conjunctiva/sclera: Conjunctivae normal.     Comments: Pupils pinpoint bilaterally  Neck:     Vascular: No carotid bruit.  Cardiovascular:     Rate and Rhythm: Normal rate and regular rhythm.     Heart sounds: No murmur heard.    No gallop.  Pulmonary:     Comments: Decreased breath sounds at bases clear to auscultation anterolaterally Musculoskeletal:     Cervical back: Neck supple.  Neurological:     Comments:  Unresponsive lethargic not following commands     Assessment/Plan: Status post acute left PCA infarct History of left MCA infarct in the past Minimally elevated high-sensitivity troponin in the setting of hypertensive urgency due to demand ischemia and acute left PCA infarct Multivessel CAD history of NSTEMI in the past status post PCI to LAD in the past with significant residual stenosis treated medically Hypertensive urgency Hyperlipidemia Seizure disorder Plan Continue present management per PMD/neurology Will start Entresto /carvedilol /spironolactone/SG L2, statin once able to take p.o. or through NG tube Dr. Claudene to follow from AM and will discuss regarding TEE to rule out cardioembolic source of stroke if clinically warranted No indication for any acute cardiac intervention Levern Hutching 08/26/2023, 2:26 PM

## 2023-08-26 NOTE — Evaluation (Signed)
 Occupational Therapy Evaluation Patient Details Name: Henry Simmons MRN: [id number] DOB: Mar 01, 1970 Today's Date: 08/26/2023   History of Present Illness   53 y.o. male [facility]25/23  with worsening level of consciousness. Pt with L PCA CVA and moderate diffuse encephalopathy.PMHx: CAD status post PCI, HFmrEF, hypertension, hyperlipidemia, seizure disorder, and history of left MCA stroke with right hemiparesis     Clinical Impressions Patient admitted for the diagnosis above.  Currently he is very lethargic, unable to open his eyes and cold not attend to any tasks.  OT will assume this will be short lived.  At home, per the spouse, patient walked household distances with a hemi-walker, and participated with UB ADL, grooming and self feeding.  In addition, patient was able to perform pant management once standing.  OT will continue efforts in the acute setting to get a better look at his current functional ability, and address deficits.  Recommend AIR for initial screen to ascertain abilities once LOA has removed.      If plan is discharge home, recommend the following:   Assist for transportation;Two people to help with walking and/or transfers;A lot of help with bathing/dressing/bathroom     Functional Status Assessment   Patient has had a recent decline in their functional status and demonstrates the ability to make significant improvements in function in a reasonable and predictable amount of time.     Equipment Recommendations   None recommended by OT     Recommendations for Other Services         Precautions/Restrictions   Precautions Precautions: Fall Recall of Precautions/Restrictions: Impaired Precaution/Restrictions Comments: R hemiparesis at baseline Restrictions Weight Bearing Restrictions Per Provider Order: No     Mobility Bed Mobility Overal bed mobility: Needs Assistance Bed Mobility: Sit to Supine, Supine to Sit, Rolling Rolling: Total  assist, +2 for safety/equipment   Supine to sit: Total assist, +2 for physical assistance, +2 for safety/equipment Sit to supine: Total assist, +2 for physical assistance, +2 for safety/equipment        Transfers                   General transfer comment: deferred      Balance Overall balance assessment: Needs assistance Sitting-balance support: No upper extremity supported, Feet supported Sitting balance-Leahy Scale: Zero                                     ADL either performed or assessed with clinical judgement   ADL Overall ADL's : Needs assistance/impaired     Grooming: Total assistance;Bed level   Upper Body Bathing: Total assistance;Bed level   Lower Body Bathing: Total assistance;Bed level   Upper Body Dressing : Total assistance;Bed level   Lower Body Dressing: Total assistance;Bed level   Toilet Transfer: Total assistance   Toileting- Clothing Manipulation and Hygiene: Total assistance               Vision   Additional Comments: Unable to assess     Perception Perception: Not tested       Praxis Praxis: Not tested       Pertinent Vitals/Pain Pain Assessment Pain Assessment: Faces Faces Pain Scale: No hurt Pain Intervention(s): Monitored during session     Extremity/Trunk Assessment Upper Extremity Assessment Upper Extremity Assessment: Right hand dominant;RUE deficits/detail RUE Deficits / Details: Per spouse has minimal AROM to R UE, cannot use the  extremity in functional tasks.  Has a resting hand splint. RUE Sensation: decreased light touch RUE Coordination: decreased fine motor;decreased gross motor   Lower Extremity Assessment Lower Extremity Assessment: Defer to PT evaluation RLE Deficits / Details: history of R hemiparesis, unable to fully assess due to decreased level of alertness   Cervical / Trunk Assessment Cervical / Trunk Assessment: Normal   Communication Communication Communication:  Impaired Factors Affecting Communication: Difficulty expressing self;Reduced clarity of speech   Cognition Arousal: Stuporous   Cognition: Difficult to assess Difficult to assess due to: Level of arousal                             Following commands: Impaired       Cueing  General Comments   Cueing Techniques: Verbal cues;Tactile cues  Family present and supportive during session. Answered home set-up and prior level of function   Exercises     Shoulder Instructions      Home Living Family/patient expects to be discharged to:: Private residence Living Arrangements: Spouse/significant other Available Help at Discharge: Family;Available 24 hours/day Type of Home: House Home Access: Ramped entrance     Home Layout: One level     Bathroom Shower/Tub: Chief Strategy Officer: Handicapped height Bathroom Accessibility: Yes   Home Equipment: Grab bars - tub/shower;BSC/3in1;Wheelchair - manual;Hospital bed          Prior Functioning/Environment Prior Level of Function : Needs assist             Mobility Comments: close guard with hemiwalker in the home and WC for longer distances. Can steer with L UE ADLs Comments: Assist from wife to complete ADLs. Does bird baths and feeds using L UE.  Patient can perform pant management while standing, assists with grooming and UB ADL    OT Problem List: Impaired balance (sitting and/or standing)   OT Treatment/Interventions: Self-care/ADL training;Therapeutic activities;Patient/family education;DME and/or AE instruction;Balance training      OT Goals(Current goals can be found in the care plan section)   Acute Rehab OT Goals OT Goal Formulation: Patient unable to participate in goal setting Time For Goal Achievement: 09/10/23 Potential to Achieve Goals: Fair ADL Goals Pt Will Perform Eating: with set-up;bed level Pt Will Perform Grooming: with set-up;bed level   OT Frequency:  Min 2X/week     Co-evaluation   Reason for Co-Treatment: Necessary to address cognition/behavior during functional activity;For patient/therapist safety;To address functional/ADL transfers PT goals addressed during session: Mobility/safety with mobility;Balance;Proper use of DME        AM-PAC OT 6 Clicks Daily Activity     Outcome Measure Help from another person eating meals?: Total Help from another person taking care of personal grooming?: Total Help from another person toileting, which includes using toliet, bedpan, or urinal?: Total Help from another person bathing (including washing, rinsing, drying)?: Total Help from another person to put on and taking off regular upper body clothing?: Total Help from another person to put on and taking off regular lower body clothing?: Total 6 Click Score: 6   End of Session Nurse Communication: Mobility status  Activity Tolerance: Patient limited by lethargy Patient left: in bed;with call bell/phone within reach;with nursing/sitter in room;with family/visitor present  OT Visit Diagnosis: Other symptoms and signs involving cognitive function                Time: 8693-8672 OT Time Calculation (min): 21 min Charges:  OT General  Charges $OT Visit: 1 Visit OT Evaluation $OT Eval Moderate Complexity: 1 Mod  08/26/2023  RP, OTR/L  Acute Rehabilitation Services  Office:  [phone]   Charlie JONETTA Halsted 08/26/2023, 1:34 PM

## 2023-08-26 NOTE — Progress Notes (Addendum)
 STROKE TEAM PROGRESS NOTE   INTERIM HISTORY/SUBJECTIVE Family at the bedside. MRI brain with entire territory left PCA infarct, and right thalamus ?  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  CBC    Component Value Date/Time   WBC 6.4 08/25/2023 1251   RBC 5.15 08/25/2023 1251   HGB 15.3 08/25/2023 1306   HGB 15.0 08/25/2023 1306   HGB 13.7 07/05/2023 1223   HCT 45.0 08/25/2023 1306   HCT 44.0 08/25/2023 1306   HCT 43.9 07/05/2023 1223   PLT 166 08/25/2023 1251   PLT 165 07/05/2023 1223   MCV 87.8 08/25/2023 1251   MCV 91 07/05/2023 1223   MCH 27.8 08/25/2023 1251   MCHC 31.6 08/25/2023 1251   RDW 13.6 08/25/2023 1251   RDW 12.8 07/05/2023 1223   LYMPHSABS 1.3 08/25/2023 1251   LYMPHSABS 1.4 07/05/2023 1223   MONOABS 0.6 08/25/2023 1251   EOSABS 0.1 08/25/2023 1251   EOSABS 0.1 07/05/2023 1223   BASOSABS 0.0 08/25/2023 1251   BASOSABS 0.0 07/05/2023 1223    BMET    Component Value Date/Time   NA 143 08/26/2023 0621   NA 147 (H) 07/05/2023 1223   K 3.9 08/26/2023 0621   CL 109 08/26/2023 0621   CO2 18 (L) 08/26/2023 0621   GLUCOSE 104 (H) 08/26/2023 0621   BUN 14 08/26/2023 0621   BUN 14 07/05/2023 1223   CREATININE 1.11 08/26/2023 0621   CALCIUM  9.5 08/26/2023 0621   EGFR 98 07/05/2023 1223   GFRNONAA >60 08/26/2023 0621    IMAGING past 24 hours ECHOCARDIOGRAM COMPLETE Result Date: 08/25/2023    ECHOCARDIOGRAM REPORT   Patient Name:   Henry Simmons Date of Exam: 08/25/2023 Medical Rec #:  [id number]      Height:       74.0 in Accession #:    [id number]     Weight:       176.4 lb Date of Birth:  1970-07-01      BSA:          2.061 m Patient Age:    52 years       BP:           199/113 mmHg Patient Gender: M              HR:           96 bpm.  Exam Location:  Inpatient Procedure: 2D Echo (Both Spectral and Color Flow Doppler were utilized during            procedure). Indications:    stroke  History:        Patient has prior history of Echocardiogram examinations, most                 recent 06/07/2021. Cardiomyopathy, CAD, chronic kidney disease;                 Risk Factors:Hypertension and Dyslipidemia.  Sonographer:    Tinnie Barefoot RDCS Referring Phys: [id number] SRISHTI L BHAGAT IMPRESSIONS  1. Unusual thickening fo LV posterior basal wall with definity  ? aberrant papillary muscle and chords.  2. Hyperechoic myocardium clinical correlation suggested to r/o amyloid . Left ventricular ejection fraction, by estimation, is 30 to 35%. The left ventricle has moderately decreased function. The left ventricle demonstrates global  hypokinesis. The left  ventricular internal cavity size was moderately dilated. There is moderate left ventricular hypertrophy. Left ventricular diastolic parameters are indeterminate.  3. Right ventricular systolic function is normal. The right ventricular size is normal.  4. The mitral valve is abnormal. Trivial mitral valve regurgitation. No evidence of mitral stenosis.  5. The aortic valve is tricuspid. Aortic valve regurgitation is not visualized. No aortic stenosis is present.  6. The inferior vena cava is normal in size with greater than 50% respiratory variability, suggesting right atrial pressure of 3 mmHg. FINDINGS  Left Ventricle: Hyperechoic myocardium clinical correlation suggested to r/o amyloid. Left ventricular ejection fraction, by estimation, is 30 to 35%. The left ventricle has moderately decreased function. The left ventricle demonstrates global hypokinesis. Definity  contrast agent was given IV to delineate the left ventricular endocardial borders. Strain was performed and the global longitudinal strain is indeterminate. The left ventricular internal cavity size was moderately dilated. There is moderate left  ventricular hypertrophy. Left ventricular diastolic parameters are indeterminate. Right Ventricle: The right ventricular size is normal. No increase in right ventricular wall thickness. Right ventricular systolic function is normal. Left Atrium: Left atrial size was normal in size. Right Atrium: Right atrial size was normal in size. Pericardium: Trivial pericardial effusion is present. The pericardial effusion is posterior to the left ventricle. Mitral Valve: The mitral valve is abnormal. There is mild thickening of the mitral valve leaflet(s). Trivial mitral valve regurgitation. No evidence of mitral valve stenosis. Tricuspid Valve: The tricuspid valve is normal in structure. Tricuspid valve regurgitation is mild . No evidence of tricuspid stenosis. Aortic Valve: The aortic valve is tricuspid. Aortic valve regurgitation is not visualized. No aortic stenosis is present. Pulmonic Valve: The pulmonic valve was normal in structure. Pulmonic valve regurgitation is not visualized. No evidence of pulmonic stenosis. Aorta: The aortic root is normal in size and structure. Venous: The inferior vena cava is normal in size with greater than 50% respiratory variability, suggesting right atrial pressure of 3 mmHg. IAS/Shunts: No atrial level shunt detected by color flow Doppler. Additional Comments: Unusual thickening fo LV posterior basal wall with definity  ? aberrant papillary muscle and chords. 3D was performed not requiring image post processing on an independent workstation and was indeterminate.  LEFT VENTRICLE PLAX 2D LVIDd:         5.10 cm      Diastology LVIDs:         4.20 cm      LV e' medial:  6.09 cm/s LV PW:         1.40 cm      LV e' lateral: 4.24 cm/s LV IVS:        1.30 cm LVOT diam:     2.50 cm LV SV:         59 LV SV Index:   29 LVOT Area:     4.91 cm  LV Volumes (MOD) LV vol d, MOD A2C: 119.0 ml LV vol d, MOD A4C: 123.0 ml LV vol s, MOD A2C: 72.2 ml LV vol s, MOD A4C: 106.0 ml LV SV MOD A2C:     46.8 ml LV  SV MOD A4C:     123.0 ml LV SV MOD BP:      31.7 ml RIGHT VENTRICLE RV S prime:     17.50 cm/s TAPSE (M-mode): 1.9 cm LEFT ATRIUM             Index LA diam:        3.00  cm 1.46 cm/m LA Vol (A2C):   47.2 ml 22.91 ml/m LA Vol (A4C):   26.9 ml 13.05 ml/m LA Biplane Vol: 35.5 ml 17.23 ml/m  AORTIC VALVE LVOT Vmax:   77.20 cm/s LVOT Vmean:  50.600 cm/s LVOT VTI:    0.120 m  AORTA Ao Asc diam: 3.70 cm  SHUNTS Systemic VTI:  0.12 m Systemic Diam: 2.50 cm Maude Emmer MD Electronically signed by Maude Emmer MD Signature Date/Time: 08/25/2023/6:13:36 PM    Final    EEG adult Result Date: 08/25/2023 Shelton Arlin KIDD, MD     08/25/2023  3:09 PM Patient Name: Henry Simmons MRN: [id number] Epilepsy Attending: Arlin KIDD Shelton Referring Physician/Provider: Jerrie Lola CROME, MD Date: 08/25/2023 Duration: 36.09 mins Patient history: 53 y.o. 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 EEG to evaluate for seizure Level of alertness: comatose/ lethargic AEDs during EEG study: Technical aspects: This EEG study was done with scalp electrodes positioned according to the 10-20 International system of electrode placement. Electrical activity was reviewed with band pass filter of 1-70Hz , sensitivity of 7 uV/mm, display speed of 63mm/sec with a 60Hz  notched filter applied as appropriate. EEG data were recorded continuously and digitally stored.  Video monitoring was available and reviewed as appropriate. Description: EEG showed continuous generalized predominantly 5 to 7 Hz theta slowing admixed with intermittent 2-3hz  delta slowing. Hyperventilation and photic stimulation were not performed.   ABNORMALITY - Continuous slow, generalized IMPRESSION: This study is suggestive of moderate diffuse encephalopathy. No seizures  or epileptiform discharges were seen throughout the recording. Priyanka KIDD Shelton   CT ANGIO HEAD NECK W WO CM (CODE STROKE) Result Date: 08/25/2023 CLINICAL DATA:  Neuro deficit, acute, stroke suspected EXAM: CT ANGIOGRAPHY HEAD AND NECK WITH AND WITHOUT CONTRAST TECHNIQUE: Multidetector CT imaging of the head and neck was performed using the standard protocol during bolus administration of intravenous contrast. Multiplanar CT image reconstructions and MIPs were obtained to evaluate the vascular anatomy. Carotid stenosis measurements (when applicable) are obtained utilizing NASCET criteria, using the distal internal carotid diameter as the denominator. RADIATION DOSE REDUCTION: This exam was performed according to the departmental dose-optimization program which includes automated exposure control, adjustment of the mA and/or kV according to patient size and/or use of iterative reconstruction technique. CONTRAST:  75mL OMNIPAQUE  IOHEXOL  350 MG/ML SOLN COMPARISON:  CT the head dated August 25, 2023 and CT angiogram of the head and neck dated December 10, 2022. FINDINGS: CTA NECK FINDINGS Aortic arch: Normal. Three vessel takeoff the great arteries. Brachiocephalic artery is widely patent. Right carotid system: Common carotid artery is mildly tortuous but widely patent. The internal carotid artery is tortuous and takes a retropharyngeal course, but is normal in caliber throughout the neck. Left carotid system: Common carotid arteries normal in caliber and unremarkable. The internal carotid artery is tortuous, but normal in caliber. Vertebral arteries: The left vertebral artery is dominant and normal in caliber throughout the neck. The right vertebral artery is relatively hypoplastic. There is mild to moderate stenosis in the proximal V2 segment there is near occlusive stenosis in the distal V4 segment. Skeleton: Multilevel degenerative disc disease.  No osseous lesions. Other neck: Negative. Upper chest: The visualized  lung apices are clear. Review of the MIP images confirms the above findings CTA HEAD FINDINGS Anterior circulation: Mild to moderate  stenosis of the ophthalmic and communicating segments of the right internal carotid artery. There is mild stenosis of the left ophthalmic segment and fusiform dilatation of the ICA terminus. There is severe stenosis of the left A1 segment. There is also severe stenosis of the left A2 segment. There is moderate stenosis of the proximal right M1 segment. There is high-grade stenosis of this M2 sylvian branch of the right middle cerebral artery. Posterior circulation: There is no occlusion of the left posterior cerebral artery at its origin. There is mild to moderate stenosis of the proximal basilar artery, similar to the prior study. There is near occlusive stenosis of the distal V4 segment of the right vertebral artery. The left V4 segment is widely patent. The cerebellar arteries are patent. Venous sinuses: Patent. Anatomic variants: None. Review of the MIP images confirms the above findings IMPRESSION: 1. 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 2. Mild-to-moderate stenosis of the basilar artery and near occlusive stenosis of the distal V4 segment of the right vertebral artery. 3. Moderate to severe stenosis of the A1 and A2 segments of the left anterior cerebral artery. 4. Moderate stenoses of the proximal M1 segment of the right middle cerebral artery and moderate stenosis of the M2 segment. 5. Tortuous cervical segments of the internal carotid arteries without flow-limiting stenosis. These results were called by telephone at the time of interpretation on 08/25/2023 at 2:17 pm to provider Dr. LOLA JERNIGAN , who verbally acknowledged these results. Electronically Signed   By: Evalene Coho M.D.   On: 08/25/2023 14:29   DG Chest Port 1 View Result Date: 08/25/2023 CLINICAL DATA:  Altered mental status. EXAM: PORTABLE CHEST 1 VIEW COMPARISON:  06/07/2021  FINDINGS: Improved inspiration. The cardiac silhouette remains mildly enlarged. The femoral pacemaker has been removed. Clear lungs with normal vascularity. Unremarkable bones. IMPRESSION: No acute abnormality. Electronically Signed   By: Elspeth Bathe M.D.   On: 08/25/2023 13:39   CT HEAD WO CONTRAST Result Date: 08/25/2023 EXAM: CT HEAD WITHOUT 08/25/2023 12:44:15 PM TECHNIQUE: CT of the head was performed without the administration of intravenous contrast. Automated exposure control, iterative reconstruction, and/or weight based adjustment of the mA/kV was utilized to reduce the radiation dose to as low as reasonably achievable. COMPARISON: None available. CLINICAL HISTORY: Mental status change, unknown cause. Patient found unconscious at home, family reports patient did not sleep well last night. 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 FINDINGS: BRAIN AND VENTRICLES: An acute non-hemorrhagic left PCA (posterior cerebral artery) territory infarct is present. A hyperdense left posterior cerebral artery is present. More chronic encephalomalacia in the left MCA (middle cerebral artery) territory is present. Stable chronic encephalomalacia is present in the high right parietal lobe. Stable moderate diffuse periventricular and subcortical white matter hypoattenuation is present bilaterally. No mass effect or midline shift. No extra-axial fluid collection. Gray-white differentiation is maintained. No hydrocephalus. ORBITS: No acute abnormality. SINUSES AND MASTOIDS: No acute abnormality. SOFT TISSUES AND SKULL: No acute skull fracture. No acute soft tissue abnormality. VASCULATURE: Atherosclerotic calcifications are present in the cavernous carotid arteries bilaterally. No hyperdense vessel is present in the anterior circulation. A hyperdense left posterior cerebral artery is present. IMPRESSION: 1. Acute non-hemorrhagic left PCA territory infarct with hyperdense left  posterior cerebral artery, consistent with vessel occlusion . 2. Chronic encephalomalacia in the left MCA territory and high right parietal lobe. 3. Stable moderate diffuse periventricular and subcortical white matter hypoattenuation bilaterally. Findings were discussed  with Dr. Jerrie at 12:55 pm. Electronically signed by: Lonni Necessary MD 08/25/2023 12:57 PM EDT RP Workstation: [id number]    Vitals:   08/25/23 2342 08/26/23 0340 08/26/23 0740 08/26/23 1206  BP: (!) 148/103 (!) 139/102 (!) 144/113 (!) 157/120  Pulse:   96   Resp: 16  14   Temp: 99.9 F (37.7 C) 99 F (37.2 C) 98.4 F (36.9 C) 98.6 F (37 C)  TempSrc: Axillary Axillary Oral Axillary  SpO2: 96%  99%      PHYSICAL EXAM General:  Alert, well-nourished, well-developed patient in no acute distress Psych:  Mood and affect appropriate for situation CV: Regular rate and rhythm on monitor Respiratory:  Regular, unlabored respirations on room air GI: Abdomen soft and nontender   NEURO:  Somnolent, non verbal, not following commands but will orient to examiner with noxious stim.  Left gaze preference. Equal response to light eyelash brush.  Right facial droop.  RUE contracted with at least 2/5 movement. LUE localized at least 3/5. BLE withdraws slightly to tickle.   ASSESSMENT/PLAN  Henry Simmons is a 53 y.o. 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 STAT Head CT revealed left PCA infarct likely > 6 hours old based on scan appearance. STAT CTA obtained which shows only the expected left P2 occlusion  NIH on Admission 27  Acute Ischemic Infarct:  left PCA and right thalamus ?  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 Chronic encephalomalacia in the left MCA territory and high right parietal lobe. Stable moderate diffuse periventricular and subcortical white matter hypoattenuation bilaterally.  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  Mild-to-moderate stenosis of the basilar artery and near occlusive stenosis of the distal V4 segment of the right vertebral artery. Moderate to severe stenosis of the A1 and A2 segments of the left anterior cerebral artery. Moderate stenoses of the proximal M1 segment of the right middle cerebral artery and moderate stenosis of the M2 segment. Tortuous cervical segments of the internal carotid arteries without flow-limiting stenosis 2D Echo EF 30-35%, LV posterior basal wall unusual thickening with definity  ?  Aberrant papillary muscle and chords  LDL 68 HgbA1c 5.4 VTE prophylaxis -SCDs aspirin  81 mg daily prior to admission, now on aspirin  325 mg daily  Currently on ASA 325mg  due to inability to swallow.  Once able to swallow would recommend dual antiplatelet therapy with aspirin  and Plavix  Therapy recommendations:  Pending Disposition: Pending   HF rEF Hypertension CAD Home meds:  Amlodipine  5mg , isordil , lopressor  25mg  Stable Blood Pressure Goal: BP less than 220/110   Hyperlipidemia Home meds:  Atorvastatin  80mg , resumed in hospital LDL 68, goal < 70 Continue statin at discharge  Post stroke epilepsy with breakthrough seizure  s/p 1500 mg keppra  per EDP STAT EEG due to ongoing intermittent grinding, rule out non convulsive status EEG suggestive of moderate diffuse encephalopathy. No seizures or epileptiform discharges were seen throughout the recording. Continue home keppra  250 mg BID for now due to somnolence on higher doses    Dysphagia Post stroke Speech therapy  to evaluate N.p.o. Will likely need core  Trak tube tomorrow   Hospital day # 1   Karna Geralds DNP, ACNPC-AG  [facility]  I have personally obtained history,examined this patient, reviewed notes, independently viewed imaging studies, participated in medical decision making and plan of care.ROS completed by me personally and pertinent positives fully documented  I have made any additions or clarifications directly to the above note. Agree with note above.  Patient presented with sudden onset of somnolence and difficult to arouse with MRI scan showing large left posterior cerebral  artery complete territory infarct with additional right thalamic infarct raising concern for possible top of the basilar artery embolism with involvement of the left PCA and artery of Percheron.  Patient does have significant dementia and spastic right hemiparesis at baseline with poor quality of life and with his additional new infarcts is likely going to be bedridden requiring 24-hour nursing home care.  I had a long discussion with the patient's wife, sister and children at the bedside and answered questions about prognosis.  Strongly encourage family to consider DNR.  Is likely going to need nutritional support with tube feeds and prolonged rehabitation and nursing care.  Continue aspirin  alone till patient has a feeding tube.  Discussed with Dr.Regalardo.  I personally spent a total of 50 minutes in the care of the patient today including getting/reviewing separately obtained history, performing a medically appropriate exam/evaluation, counseling and educating, placing orders, referring and communicating with other health care professionals, documenting clinical information in the EHR, independently interpreting results, and coordinating care.         Eather Popp, MD Medical Director [facility] Pager: [phone] 08/26/2023 3:48 PM    To contact Stroke Continuity provider, please refer  to [email or web address]. After hours, contact General Neurology

## 2023-08-26 NOTE — Progress Notes (Signed)
 Pt had no urine output since received, bladder scan done shows 290 cc, assessed no bladder distention. Safely applied warm compress over lower abdomen. Notified MD. Marcene, to do repeat bladder scan in the morning as ordered.

## 2023-08-27 ENCOUNTER — Inpatient Hospital Stay (HOSPITAL_COMMUNITY)

## 2023-08-27 DIAGNOSIS — I69351 Hemiplegia and hemiparesis following cerebral infarction affecting right dominant side: Secondary | ICD-10-CM

## 2023-08-27 DIAGNOSIS — I69320 Aphasia following cerebral infarction: Secondary | ICD-10-CM

## 2023-08-27 DIAGNOSIS — I639 Cerebral infarction, unspecified: Secondary | ICD-10-CM

## 2023-08-27 DIAGNOSIS — I6389 Other cerebral infarction: Secondary | ICD-10-CM

## 2023-08-27 DIAGNOSIS — G40802 Other epilepsy, not intractable, without status epilepticus: Secondary | ICD-10-CM

## 2023-08-27 DIAGNOSIS — I69391 Dysphagia following cerebral infarction: Secondary | ICD-10-CM

## 2023-08-27 DIAGNOSIS — E785 Hyperlipidemia, unspecified: Secondary | ICD-10-CM

## 2023-08-27 DIAGNOSIS — I63432 Cerebral infarction due to embolism of left posterior cerebral artery: Secondary | ICD-10-CM

## 2023-08-27 DIAGNOSIS — F0390 Unspecified dementia without behavioral disturbance: Secondary | ICD-10-CM

## 2023-08-27 LAB — HEMOGLOBIN A1C
Hgb A1c MFr Bld: 5.5 % (ref 4.8–5.6)
Mean Plasma Glucose: 111 mg/dL

## 2023-08-27 LAB — LEVETIRACETAM LEVEL: Levetiracetam Lvl: 48.7 ug/mL — ABNORMAL HIGH (ref 10.0–40.0)

## 2023-08-27 MED ORDER — PROSOURCE TF20 ENFIT COMPATIBL EN LIQD
60.0000 mL | Freq: Every day | ENTERAL | Status: DC
  Administered 2023-08-27 – 2023-08-29 (×3): 60 mL
  Filled 2023-08-27 (×3): qty 60

## 2023-08-27 MED ORDER — OSMOLITE 1.5 CAL PO LIQD
1000.0000 mL | ORAL | Status: DC
  Administered 2023-08-27 – 2023-08-28 (×4): 1000 mL

## 2023-08-27 MED ORDER — THIAMINE MONONITRATE 100 MG PO TABS
100.0000 mg | ORAL_TABLET | Freq: Every day | ORAL | Status: DC
  Administered 2023-08-27 – 2023-08-29 (×3): 100 mg
  Filled 2023-08-27 (×3): qty 1

## 2023-08-27 MED ORDER — FREE WATER
125.0000 mL | Status: DC
  Administered 2023-08-27 – 2023-08-29 (×12): 125 mL

## 2023-08-27 NOTE — Consult Note (Signed)
 Ref: Petrina Pries, NP   Subjective:  Resting comfortably. VS stable.  Objective:  Vital Signs in the last 24 hours: Temp:  [98.2 F (36.8 C)-98.7 F (37.1 C)] 98.2 F (36.8 C) (07/14 1143) Pulse Rate:  [93-114] 102 (07/14 1143) Cardiac Rhythm: Sinus tachycardia;Heart block (07/14 0706) Resp:  [13-19] 16 (07/14 0600) BP: (147-165)/(116-119) 165/116 (07/14 1143) SpO2:  [96 %-100 %] 100 % (07/14 1143)  Physical Exam: BP Readings from Last 1 Encounters:  08/27/23 (!) 165/116     Wt Readings from Last 1 Encounters:  08/14/23 80 kg    Weight change:  There is no height or weight on file to calculate BMI. HEENT: [place]/AT, Eyes-Brown, Conjunctiva-Pink, Sclera-Non-icteric Neck: No JVD, No bruit, Trachea midline. Lungs:  Clear, Bilateral. Cardiac:  Regular rhythm, normal S1 and S2, no S3. II/VI systolic murmur. Abdomen:  Soft, non-tender. BS present. Extremities:  No edema present. No cyanosis. No clubbing. CNS: AxOx1 Thick speech  Skin: Warm and dry.   Intake/Output from previous day: 07/13 0701 - 07/14 0700 In: 783.8 [I.V.:776.3; IV Piggyback:7.5] Out: 400 [Urine:400]    Lab Results: BMET    Component Value Date/Time   NA 143 08/26/2023 0621   NA 140 08/25/2023 1435   NA 141 08/25/2023 1306   NA 141 08/25/2023 1306   NA 147 (H) 07/05/2023 1223   NA 145 (H) 01/04/2023 1243   NA 145 (H) 10/02/2022 1256   K 3.9 08/26/2023 0621   K 3.3 (L) 08/25/2023 1435   K 6.1 (H) 08/25/2023 1306   K 6.0 (H) 08/25/2023 1306   CL 109 08/26/2023 0621   CL 100 08/25/2023 1435   CL 106 08/25/2023 1306   CO2 18 (L) 08/26/2023 0621   CO2 32 08/25/2023 1435   CO2 24 07/05/2023 1223   GLUCOSE 104 (H) 08/26/2023 0621   GLUCOSE 101 (H) 08/25/2023 1435   GLUCOSE 105 (H) 08/25/2023 1306   BUN 14 08/26/2023 0621   BUN 10 08/25/2023 1435   BUN 16 08/25/2023 1306   BUN 14 07/05/2023 1223   BUN 19 01/04/2023 1243   BUN 16 10/02/2022 1256   CREATININE 1.11 08/26/2023 0621   CREATININE  1.03 08/25/2023 1435   CREATININE 1.10 08/25/2023 1306   CALCIUM  9.5 08/26/2023 0621   CALCIUM  9.6 08/25/2023 1435   CALCIUM  9.7 07/05/2023 1223   GFRNONAA >60 08/26/2023 0621   GFRNONAA >60 08/25/2023 1435   GFRNONAA >60 12/10/2022 0615   GFRAA 79 (L) 10/25/2012 0605   GFRAA 86 (L) 10/24/2012 1954   GFRAA 90 (L) 10/24/2012 0957   CBC    Component Value Date/Time   WBC 6.4 08/25/2023 1251   RBC 5.15 08/25/2023 1251   HGB 15.3 08/25/2023 1306   HGB 15.0 08/25/2023 1306   HGB 13.7 07/05/2023 1223   HCT 45.0 08/25/2023 1306   HCT 44.0 08/25/2023 1306   HCT 43.9 07/05/2023 1223   PLT 166 08/25/2023 1251   PLT 165 07/05/2023 1223   MCV 87.8 08/25/2023 1251   MCV 91 07/05/2023 1223   MCH 27.8 08/25/2023 1251   MCHC 31.6 08/25/2023 1251   RDW 13.6 08/25/2023 1251   RDW 12.8 07/05/2023 1223   LYMPHSABS 1.3 08/25/2023 1251   LYMPHSABS 1.4 07/05/2023 1223   MONOABS 0.6 08/25/2023 1251   EOSABS 0.1 08/25/2023 1251   EOSABS 0.1 07/05/2023 1223   BASOSABS 0.0 08/25/2023 1251   BASOSABS 0.0 07/05/2023 1223   HEPATIC Function Panel Recent Labs    12/10/22 0404  01/04/23 1243 08/25/23 1435  PROT 6.7 6.6 6.8  ALBUMIN 3.9 4.6 3.9  AST 30 20 30   ALT 37 31 59*  ALKPHOS 67 82 62   HEMOGLOBIN A1C Lab Results  Component Value Date   MPG 111 08/25/2023   CARDIAC ENZYMES Lab Results  Component Value Date   CKTOTAL 319 (H) 09/18/2010   CKMB 2.0 09/18/2010   TROPONINI <0.30 10/24/2012   TROPONINI <0.30 09/15/2012   TROPONINI <0.30 09/18/2010   BNP No results for input(s): PROBNP in the last 8760 hours. TSH Recent Labs    08/25/23 1253  TSH 0.346*   CHOLESTEROL Recent Labs    10/02/22 1256 01/04/23 1243 08/26/23 0621  CHOL 113 117 102    Scheduled Meds:  aspirin   325 mg Oral Daily   Or   aspirin   300 mg Rectal Daily   atorvastatin   80 mg Oral Daily   enoxaparin  (LOVENOX ) injection  40 mg Subcutaneous Q24H   levETIRAcetam   250 mg Intravenous Q12H   mouth  rinse  15 mL Mouth Rinse 4 times per day   pantoprazole   40 mg Oral Daily   Continuous Infusions:  lactated ringers  45 mL/hr at 08/26/23 1809   PRN Meds:.labetalol , mouth rinse  Assessment/Plan: Acute left MCA infarct S/P old strokes Abnormal troponin I from demand ischemia Hypertensice urgency Hyperlipidemia Seizure disorder  Plan: Slow control over hypertension. Increase activity as tolerated.   LOS: 2 days   Time spent including chart review, lab review, examination, discussion with patient /Nurse/Family  30 min   Salena Negri  MD  08/27/2023, 1:13 PM

## 2023-08-27 NOTE — Progress Notes (Deleted)
 SLP Cancellation Note  Patient Details Name: Henry Simmons MRN: [id number] DOB: [birth year]   Cancelled treatment:       Reason Eval/Treat Not Completed: Patient declined, states he is currently at baseline for cognitive linguistics. He indicates his deficits are all physical. Please reconsult if needs arise.   Carlyle Mcelrath B. Dory, MSP, CCC-SLP Speech Language Pathologist Office: [phone]  Dory Caprice Daring 08/27/2023, 12:33 PM

## 2023-08-27 NOTE — Progress Notes (Signed)
 STROKE TEAM PROGRESS NOTE   INTERIM HISTORY/SUBJECTIVE His wife and 1 male family member are at the bedside. Neurological exam is unchanged.  Patient with his eyes closed, will partially open eyes to continuous noxious stimuli, patient is nonverbal does not follow commands right side spastic hemiparesis and purposeful movements on the left noted. Vital signs are stable. CBC    Component Value Date/Time   WBC 6.4 08/25/2023 1251   RBC 5.15 08/25/2023 1251   HGB 15.3 08/25/2023 1306   HGB 15.0 08/25/2023 1306   HGB 13.7 07/05/2023 1223   HCT 45.0 08/25/2023 1306   HCT 44.0 08/25/2023 1306   HCT 43.9 07/05/2023 1223   PLT 166 08/25/2023 1251   PLT 165 07/05/2023 1223   MCV 87.8 08/25/2023 1251   MCV 91 07/05/2023 1223   MCH 27.8 08/25/2023 1251   MCHC 31.6 08/25/2023 1251   RDW 13.6 08/25/2023 1251   RDW 12.8 07/05/2023 1223   LYMPHSABS 1.3 08/25/2023 1251   LYMPHSABS 1.4 07/05/2023 1223   MONOABS 0.6 08/25/2023 1251   EOSABS 0.1 08/25/2023 1251   EOSABS 0.1 07/05/2023 1223   BASOSABS 0.0 08/25/2023 1251   BASOSABS 0.0 07/05/2023 1223    BMET    Component Value Date/Time   NA 143 08/26/2023 0621   NA 147 (H) 07/05/2023 1223   K 3.9 08/26/2023 0621   CL 109 08/26/2023 0621   CO2 18 (L) 08/26/2023 0621   GLUCOSE 104 (H) 08/26/2023 0621   BUN 14 08/26/2023 0621   BUN 14 07/05/2023 1223   CREATININE 1.11 08/26/2023 0621   CALCIUM  9.5 08/26/2023 0621   EGFR 98 07/05/2023 1223   GFRNONAA >60 08/26/2023 0621    IMAGING past 24 hours No results found.   Vitals:   08/27/23 0000 08/27/23 0600 08/27/23 0700 08/27/23 1143  BP:    (!) 165/116  Pulse: 96 (!) 114  (!) 102  Resp: 19 16    Temp:   98.7 F (37.1 C) 98.2 F (36.8 C)  TempSrc:   Axillary Axillary  SpO2: 100% 97%  100%     PHYSICAL EXAM General:  Alert, well-nourished, well-developed patient in no acute distress Psych:  Mood and affect appropriate for situation CV: Regular rate and rhythm on  monitor Respiratory:  Regular, unlabored respirations on room air GI: Abdomen soft and nontender   NEURO:  Somnolent, non verbal, not following commands but will orient to examiner with noxious stim.  Left gaze preference. Equal response to light eyelash brush.  Right facial droop.  RUE contracted with at least 2/5 movement. LUE localized at least 3/5. BLE withdraws slightly to tickle.   ASSESSMENT/PLAN  [name] is a 53 y.o. 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 STAT Head CT revealed left PCA infarct likely > 6 hours old based on scan appearance. STAT CTA obtained which shows only the expected left P2 occlusion  NIH on Admission 27  Acute Ischemic Infarct:  left PCA and right thalamus ?  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 Chronic encephalomalacia in the left MCA territory and high right parietal lobe. Stable moderate diffuse periventricular and subcortical white matter  hypoattenuation bilaterally.  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  Mild-to-moderate stenosis of the basilar artery and near occlusive stenosis of the distal V4 segment of the right vertebral artery. Moderate to severe stenosis of the A1 and A2 segments of the left anterior cerebral artery. Moderate stenoses of the proximal M1 segment of the right middle cerebral artery and moderate stenosis of the M2 segment. Tortuous cervical segments of the internal carotid arteries without flow-limiting stenosis 2D Echo EF 30-35%,  LV posterior basal wall unusual thickening with definity  ?  Aberrant papillary muscle and chords  LDL 68 HgbA1c 5.4 VTE prophylaxis -SCDs aspirin  81 mg daily prior to admission, now on aspirin  325 mg daily  Currently on ASA 325mg  due to inability to swallow.  Once able to swallow would recommend dual antiplatelet therapy with aspirin  and Plavix  Therapy recommendations:  Pending Disposition: Pending   HF rEF Hypertension CAD Home meds:  Amlodipine  5mg , isordil , lopressor  25mg  Stable Blood Pressure Goal: BP less than 220/110   Hyperlipidemia Home meds:  Atorvastatin  80mg , resumed in hospital LDL 68, goal < 70 Continue statin at discharge  Post stroke epilepsy with breakthrough seizure  s/p 1500 mg keppra  per EDP STAT EEG due to ongoing intermittent grinding, rule out non convulsive status EEG suggestive of moderate diffuse encephalopathy. No seizures or epileptiform discharges were seen throughout the recording. Continue home keppra  250 mg BID for now due to somnolence on higher doses    Dysphagia Post stroke Speech therapy to evaluate N.p.o. Will likely need core Trak tube tomorrow   Hospital day # 2     Patient presented with sudden onset of somnolence and difficult to arouse with MRI scan showing large left posterior cerebral  artery complete territory infarct with additional right thalamic infarct raising concern for possible top of the basilar artery embolism with involvement of the left PCA and artery of Percheron.  Patient does have significant dementia and spastic right hemiparesis at baseline with poor quality of life and with his additional new infarcts is likely going to be bedridden requiring 24-hour nursing home care.  I had a long discussion with the patient's wife, and main family member at the bedside and answered questions about prognosis.  Strongly encourage family to consider DNR.  Is likely going to need nutritional support with tube feeds and prolonged  rehabitation and nursing care.  The wife however insist on full medical support and wants a feeding tube.  Continue aspirin  alone till patient has a feeding tube.  Palliative care consult would also be appropriate at this time discussed with Dr.Farhan.  I personally spent a total of 35 minutes in the care of the patient today including getting/reviewing separately obtained history, performing a medically appropriate exam/evaluation, counseling and educating, placing orders, referring and communicating with other health care professionals, documenting clinical information in the EHR, independently interpreting results, and coordinating care.   Stroke team will sign off.  Kindly call for questions     Eather Popp, MD Medical Director [facility] Pager: [phone] 08/27/2023 1:39 PM    To contact Stroke Continuity provider, please refer to [email or web address]. After hours, contact General Neurology

## 2023-08-27 NOTE — Progress Notes (Signed)
 Initial Nutrition Assessment  DOCUMENTATION CODES:   Non-severe (moderate) malnutrition in context of chronic illness  INTERVENTION:  Initiate tube feeding via cortrak: Osmolite 1.5 at 60 ml/h (1440 ml per day) Start at 30 and advance by 10mL every 12 hours to reach goal Prosource TF20 60 ml 1x/d Free water : 125mL every 4 hours Provides 2240 kcal, 110 gm protein, 1847 ml free water  daily Pt is at risk for refeeding syndrome given malnutrition. Monitor magnesium  and phosphorus daily x 2 days, MD to replete as needed. 100mg  thiamine  x 5 days   NUTRITION DIAGNOSIS:   Moderate Malnutrition related to chronic illness (recurrent CVAs) as evidenced by mild fat depletion, moderate muscle depletion.  GOAL:   Patient will meet greater than or equal to 90% of their needs  MONITOR:   TF tolerance, Labs, Weight trends  REASON FOR ASSESSMENT:   Consult Enteral/tube feeding initiation and management  ASSESSMENT:  Pt with hx of HTN, HLD, CAD, several prior CVAs, and seizure disorder presented to ED with AMS. Imaging showed left PCA infarct  7/12 - presented to ED 7/14 - cortrak placed  Pt resting in bed at the time of assessment. Family at bedside able to provide a hx. Pt has had recurrent strokes. Was able to ambulate with a walker PTA with assistance. Family reports great appetite. Muscle and fat deficits noted throughout exam, likely consistent with a decrease in mobility over time following strokes.   Cortrak placed today, will initiate TF slowly to monitor for refeeding syndrome. Family doe report that last meal was Saturday.   Admit / Current weight: no weight yet this admission, last recorded ws 80kg on 7/1   Nutritionally Relevant Medications: Scheduled Meds:  atorvastatin   80 mg Oral Daily   levETIRAcetam   250 mg Intravenous Q12H   pantoprazole   40 mg Oral Daily   Continuous Infusions:  lactated ringers  45 mL/hr at 08/26/23 1809   Labs Reviewed  NUTRITION - FOCUSED  PHYSICAL EXAM: Flowsheet Row Most Recent Value  Orbital Region No depletion  Upper Arm Region Mild depletion  Thoracic and Lumbar Region Mild depletion  Buccal Region Mild depletion  Temple Region No depletion  Clavicle Bone Region Moderate depletion  Clavicle and Acromion Bone Region Moderate depletion  Scapular Bone Region Mild depletion  Dorsal Hand Mild depletion  Patellar Region Moderate depletion  Anterior Thigh Region Moderate depletion  Posterior Calf Region Moderate depletion  Edema (RD Assessment) None  Hair Reviewed  Eyes Reviewed  Mouth Reviewed  Skin Reviewed  Nails Reviewed    Diet Order:   Diet Order             Diet NPO time specified  Diet effective now                   EDUCATION NEEDS:  Not appropriate for education at this time  Skin:  Skin Assessment: Reviewed RN Assessment  Last BM:  7/12  Height:  Ht Readings from Last 1 Encounters:  08/27/23 6' 2 (1.88 m)    Weight:  Wt Readings from Last 1 Encounters:  08/14/23 80 kg    Ideal Body Weight:  86.4 kg  BMI:  Body mass index is 22.65 kg/m.  Estimated Nutritional Needs:  Kcal:  2100-2300 kcal/d Protein:  110-125g/d Fluid:  2.2L/d    Vernell Lukes, RD, LDN, CNSC Registered Dietitian II Please reach out via secure chat

## 2023-08-27 NOTE — Plan of Care (Signed)
 Problem: Education: Goal: Knowledge of disease or condition will improve 08/27/2023 1706 by Jenel Bobetta SAILOR, RN Outcome: Progressing 08/27/2023 1059 by Jenel Bobetta SAILOR, RN Outcome: Progressing Goal: Knowledge of secondary prevention will improve (MUST DOCUMENT ALL) 08/27/2023 1706 by Jenel Bobetta SAILOR, RN Outcome: Progressing 08/27/2023 1059 by Jenel Bobetta SAILOR, RN Outcome: Progressing Goal: Knowledge of patient specific risk factors will improve (DELETE if not current risk factor) 08/27/2023 1706 by Jenel Bobetta SAILOR, RN Outcome: Progressing 08/27/2023 1059 by Jenel Bobetta SAILOR, RN Outcome: Progressing   Problem: Ischemic Stroke/TIA Tissue Perfusion: Goal: Complications of ischemic stroke/TIA will be minimized 08/27/2023 1706 by Jenel Bobetta SAILOR, RN Outcome: Progressing 08/27/2023 1059 by Jenel Bobetta SAILOR, RN Outcome: Progressing   Problem: Coping: Goal: Will verbalize positive feelings about self 08/27/2023 1706 by Jenel Bobetta SAILOR, RN Outcome: Progressing 08/27/2023 1059 by Jenel Bobetta SAILOR, RN Outcome: Progressing Goal: Will identify appropriate support needs 08/27/2023 1706 by Jenel Bobetta SAILOR, RN Outcome: Progressing 08/27/2023 1059 by Jenel Bobetta SAILOR, RN Outcome: Progressing   Problem: Health Behavior/Discharge Planning: Goal: Ability to manage health-related needs will improve 08/27/2023 1706 by Jenel Bobetta SAILOR, RN Outcome: Progressing 08/27/2023 1059 by Jenel Bobetta SAILOR, RN Outcome: Progressing Goal: Goals will be collaboratively established with patient/family 08/27/2023 1706 by Jenel Bobetta SAILOR, RN Outcome: Progressing 08/27/2023 1059 by Jenel Bobetta SAILOR, RN Outcome: Progressing   Problem: Self-Care: Goal: Ability to participate in self-care as condition permits will improve 08/27/2023 1706 by Jenel Bobetta SAILOR, RN Outcome: Progressing 08/27/2023 1059 by Jenel Bobetta SAILOR, RN Outcome: Progressing Goal: Verbalization of feelings and concerns over difficulty with self-care will  improve 08/27/2023 1706 by Jenel Bobetta SAILOR, RN Outcome: Progressing 08/27/2023 1059 by Jenel Bobetta SAILOR, RN Outcome: Progressing Goal: Ability to communicate needs accurately will improve 08/27/2023 1706 by Jenel Bobetta SAILOR, RN Outcome: Progressing 08/27/2023 1059 by Jenel Bobetta SAILOR, RN Outcome: Progressing   Problem: Nutrition: Goal: Risk of aspiration will decrease 08/27/2023 1706 by Jenel Bobetta SAILOR, RN Outcome: Progressing 08/27/2023 1059 by Jenel Bobetta SAILOR, RN Outcome: Progressing Goal: Dietary intake will improve 08/27/2023 1706 by Jenel Bobetta SAILOR, RN Outcome: Progressing 08/27/2023 1059 by Jenel Bobetta SAILOR, RN Outcome: Progressing   Problem: Education: Goal: Knowledge of General Education information will improve Description: Including pain rating scale, medication(s)/side effects and non-pharmacologic comfort measures 08/27/2023 1706 by Jenel Bobetta SAILOR, RN Outcome: Progressing 08/27/2023 1059 by Jenel Bobetta SAILOR, RN Outcome: Progressing   Problem: Health Behavior/Discharge Planning: Goal: Ability to manage health-related needs will improve 08/27/2023 1706 by Jenel Bobetta SAILOR, RN Outcome: Progressing 08/27/2023 1059 by Jenel Bobetta SAILOR, RN Outcome: Progressing   Problem: Clinical Measurements: Goal: Ability to maintain clinical measurements within normal limits will improve 08/27/2023 1706 by Jenel Bobetta SAILOR, RN Outcome: Progressing 08/27/2023 1059 by Jenel Bobetta SAILOR, RN Outcome: Progressing Goal: Will remain free from infection 08/27/2023 1706 by Jenel Bobetta SAILOR, RN Outcome: Progressing 08/27/2023 1059 by Jenel Bobetta SAILOR, RN Outcome: Progressing Goal: Diagnostic test results will improve 08/27/2023 1706 by Jenel Bobetta SAILOR, RN Outcome: Progressing 08/27/2023 1059 by Jenel Bobetta SAILOR, RN Outcome: Progressing Goal: Respiratory complications will improve 08/27/2023 1706 by Jenel Bobetta SAILOR, RN Outcome: Progressing 08/27/2023 1059 by Jenel Bobetta SAILOR, RN Outcome: Progressing Goal:  Cardiovascular complication will be avoided 08/27/2023 1706 by Jenel Bobetta SAILOR, RN Outcome: Progressing 08/27/2023 1059 by Jenel Bobetta SAILOR, RN Outcome: Progressing   Problem: Activity: Goal: Risk for activity intolerance will decrease 08/27/2023 1706 by Jenel Bobetta SAILOR, RN Outcome: Progressing 08/27/2023 1059 by  Jenel Bobetta SAILOR, RN Outcome: Progressing   Problem: Nutrition: Goal: Adequate nutrition will be maintained 08/27/2023 1706 by Jenel Bobetta SAILOR, RN Outcome: Progressing 08/27/2023 1059 by Jenel Bobetta SAILOR, RN Outcome: Progressing   Problem: Coping: Goal: Level of anxiety will decrease 08/27/2023 1706 by Jenel Bobetta SAILOR, RN Outcome: Progressing 08/27/2023 1059 by Jenel Bobetta SAILOR, RN Outcome: Progressing   Problem: Elimination: Goal: Will not experience complications related to bowel motility 08/27/2023 1706 by Jenel Bobetta SAILOR, RN Outcome: Progressing 08/27/2023 1059 by Jenel Bobetta SAILOR, RN Outcome: Progressing Goal: Will not experience complications related to urinary retention 08/27/2023 1706 by Jenel Bobetta SAILOR, RN Outcome: Progressing 08/27/2023 1059 by Jenel Bobetta SAILOR, RN Outcome: Progressing   Problem: Pain Managment: Goal: General experience of comfort will improve and/or be controlled 08/27/2023 1706 by Jenel Bobetta SAILOR, RN Outcome: Progressing 08/27/2023 1059 by Jenel Bobetta SAILOR, RN Outcome: Progressing   Problem: Safety: Goal: Ability to remain free from injury will improve 08/27/2023 1706 by Jenel Bobetta SAILOR, RN Outcome: Progressing 08/27/2023 1059 by Jenel Bobetta SAILOR, RN Outcome: Progressing   Problem: Skin Integrity: Goal: Risk for impaired skin integrity will decrease 08/27/2023 1706 by Jenel Bobetta SAILOR, RN Outcome: Progressing 08/27/2023 1059 by Jenel Bobetta SAILOR, RN Outcome: Progressing

## 2023-08-27 NOTE — TOC Initial Note (Signed)
 Transition of Care [facility]) - Initial/Assessment Note    Patient Details  Name: Henry Simmons MRN: [id number] Date of Birth: 02/25/1970  Transition [facility]) CM/SW Contact:    Andrez JULIANNA George, RN Phone Number: 08/27/2023, 2:54 PM  Clinical Narrative:                  CM met with the patient, sister, parents in the room. Wife was not present at the time of CM visit. They state pt is from home with wife but wife does work outside the home. Dad usually visits daily and walks with him and his walker.  Wife provides needed transportation and medications. Per Neurology note pt will need a feeding tube.  Per CIR, pt most likely wont tolerate 3 hours of therapy a day at this time. IP Care Management following for potential SNF rehab.    Expected Discharge Plan: Skilled Nursing Facility Barriers to Discharge: Continued Medical Work up   Patient Goals and CMS Choice   CMS Medicare.gov Compare Post Acute Care list provided to:: Patient Represenative (must comment) Choice offered to / list presented to : Spouse      Expected Discharge Plan and Services In-house Referral: Clinical Social Work Discharge Planning Services: CM Consult Post Acute Care Choice: Skilled Nursing Facility Living arrangements for the past 2 months: Single Family Home                                      Prior Living Arrangements/Services Living arrangements for the past 2 months: Single Family Home Lives with:: Spouse              Current home services: DME (walker) Criminal Activity/Legal Involvement Pertinent to Current Situation/Hospitalization: No - Comment as needed  Activities of Daily Living   ADL Screening (condition at time of admission) Is the patient deaf or have difficulty hearing?: No Does the patient have difficulty seeing, even when wearing glasses/contacts?: No Does the patient have difficulty concentrating, remembering, or making decisions?: Yes  Permission Sought/Granted                   Emotional Assessment Appearance:: Appears stated age         Psych Involvement: No (comment)  Admission diagnosis:  CVA (cerebral vascular accident) (HCC) [I63.9] Cerebrovascular accident (CVA), unspecified mechanism (HCC) [I63.9] Patient Active Problem List   Diagnosis Date Noted   CVA (cerebral vascular accident) (HCC) 08/25/2023   Primary hypertension 07/17/2023   Neurologic deficit due to old ischemic stroke 04/06/2023   Atrioventricular block, complete (HCC) 01/13/2023   Stenosis of artery (HCC) 01/13/2023   Stage 3a chronic kidney disease (HCC) 01/13/2023   Seizure (HCC) 01/13/2023   Encounter for general adult medical examination w/o abnormal findings 01/13/2023   Screening for colon cancer 01/13/2023   Screening for prostate cancer 01/13/2023   Herpes zoster vaccination declined 01/13/2023   Hypertensive nephropathy 01/13/2023   Status epilepticus (HCC) 12/10/2022   Lactic acidosis 12/10/2022   Thrombocytopenia (HCC) 12/10/2022   CAD (coronary artery disease) 12/10/2022   Chronic heart failure (HCC) 12/10/2022   Tobacco dependence 10/06/2022   Aphasia as late effect of stroke 10/06/2022   Need for Tdap vaccination 10/06/2022   Need for hepatitis C screening test 10/06/2022   Acute ischemic left MCA stroke (HCC) 06/14/2021   Right hemiplegia (HCC) 06/14/2021   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   Cerebral embolism with cerebral infarction 06/09/2021   NSTEMI (non-ST elevated myocardial infarction) (HCC) 06/07/2021   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   Heart block AV second degree    Ischemic cardiomyopathy 12/01/2020   Non-ST elevation (NSTEMI) myocardial infarction (HCC) 11/29/2020   Cerebral infarction (HCC) 10/24/2012   Dyslipidemia, goal LDL below 100 10/24/2012   Essential hypertension, malignant 09/15/2012   Right sided weakness 09/15/2012   History of CVA  (cerebrovascular accident) without residual deficits 09/15/2012   Acute left ACA ischemic stroke (HCC) 09/15/2012   Tobacco use 09/15/2012   Benign hypertension with CKD (chronic kidney disease) stage III (HCC) 10/26/2010   PCP:  Petrina Pries, NP Pharmacy:   [facility] [phone] - [place], [place] - [address] AT [facility] [address] [facility] [address] [name] [address] Phone: [phone] Fax: [phone]  [facility] [address] [name] [address] Phone: [phone] Fax: [phone]  [facility] - [facility] [address] [name] [address] Phone: [phone] Fax: [phone]     Social Drivers of Health (SDOH) Social History: SDOH Screenings   Food Insecurity: No Food Insecurity (08/26/2023)  Housing: Unknown (08/26/2023)  Transportation Needs: No Transportation Needs (08/26/2023)  Utilities: Not At Risk (08/26/2023)  Depression (PHQ2-9): Low Risk  (07/05/2023)  Tobacco Use: High Risk (08/26/2023)   SDOH Interventions:     Readmission Risk Interventions     No data to display

## 2023-08-27 NOTE — Progress Notes (Signed)
 Progress Note   Patient: SACRAMENTO MONDS FMW:[id number] DOB: [birth year] DOA: 08/25/2023     2 DOS: the patient was seen and examined on 08/27/2023   Brief hospital course: 53 year old with past medical history significant for CAD status post PCI, heart failure with moderate reduced ejection fraction, hypertension, hyperlipidemia, seizure disorder, history of left MCA stroke with right hemiparesis at baseline and moderate aphasia present with new left PCA stroke.  Neurology has been consulted, pending placement to inpatient rehab/skilled nursing facility.  Assessment and Plan: Acute Left PCA stroke:  - CT head without contrast acute nonhemorrhagic left PCA territory infarct with hyperdense left posterior cerebral artery consistent with vessel occlusion.  Chronic encephalomalacia of the left MCA territory and right parietal lobe.  - CT angio head and neck: Occlusion of the left posterior cerebral artery at its origin.  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  Moderate to severe stenosis A1 A2, moderate stenosis M2  2D echo ejection fraction 30 to 35%, left posterior basal unusual thickening with definitive aberrant papillary muscle and core, wall motion abnormality -LDL 68, A1c 5.4  -Neurology recommend aspirin  325 when able to swallow anticipate dual antiplatelets treatment  -Plan for core track today -Patient will likely need PEG tube, will need goals of care discussion.   -Palliative care consulted    Lactic acidosis: No acute issues now.   CAD status post PCI: Chronic systolic heart failure with ejection fraction 35% Troponinemia/Type 2 MI in the setting of demand ischemia, HTN emergency and left PCA Stioke: Echo with worsening ejection fraction 35%, generalized wall motion abnormality. Cardiology on board and the plan is to initiate goal-directed medical therapy once able to take p.o. through the NG tube.   Hypertension:  - as  needed Labetolol.   Hyperlipidemia: Resume statin when taking orals   Seizure disorder: - Patient was loaded with IV Keppra  -Continue IV Keppra  250 twice daily.      Subjective: Patient is drowsy.  He was note able to communicate.  Physical Exam: Vitals:   08/26/23 2000 08/27/23 0000 08/27/23 0600 08/27/23 0700  BP:      Pulse: 93 96 (!) 114   Resp: 13 19 16    Temp:    98.7 F (37.1 C)  TempSrc:    Axillary  SpO2: 97% 100% 97%    Constitutional: Appears lethargic, unable to communicate effectively appears Eyes: PERRL, lids and conjunctivae normal ENMT: Mucous membranes are moist. Posterior pharynx clear of any exudate or lesions.Normal dentition.  Neck: normal, supple, no masses, no thyromegaly Respiratory: clear to auscultation bilaterally, no wheezing, no crackles. Normal respiratory effort. No accessory muscle use.  Cardiovascular: Regular rate and rhythm, no murmurs / rubs / gallops. No extremity edema. 2+ pedal pulses. No carotid bruits.  Abdomen: no tenderness, no masses palpated. No hepatosplenomegaly. Bowel sounds positive.  Musculoskeletal: no clubbing / cyanosis. No joint deformity upper and lower extremities. Good ROM, no contractures. Normal muscle tone.  Skin: no rashes, lesions, ulcers. No induration Neurologic: Unable to examine motor and sensory systems secondary to altered mentation.  Psychiatric: Lethargic, unable to effectively unable to examine Data Reviewed:  There are no new results to review at this time.  Family Communication: None at the bedside  Disposition: Status is: Inpatient Remains inpatient appropriate because: Altered mentation  Planned Discharge Destination: Rehab    Time spent: 41 minutes  Author: Deliliah Room, MD 08/27/2023 9:45 AM  For on call review [email or web address].

## 2023-08-27 NOTE — Plan of Care (Signed)

## 2023-08-27 NOTE — Progress Notes (Signed)
 SLP Cancellation Note  Patient Details Name: Henry Simmons MRN: [id number] DOB: 1971/01/13   Cancelled treatment:       Reason Eval/Treat Not Completed: Patient at procedure or test/unavailable. Pt with nursing for patient care. Will continue efforts to complete SLE.  Diquan Kassis B. Henry, MSP, CCC-SLP Speech Language Pathologist Office: [phone]  Henry Simmons 08/27/2023, 10:26 AM

## 2023-08-27 NOTE — Progress Notes (Signed)
 SLP Cancellation Note  Patient Details Name: Henry Simmons MRN: [id number] DOB: 02/06/1971   Cancelled treatment:       Reason Eval/Treat Not Completed: Fatigue/lethargy limiting ability to participate. Pt not alert enough for language evaluation at this time. Family present and confirms baseline deficits with receptive and expressive language. Will continue efforts to complete evaluation.  Pt is currently NPO with no source of nutrition. No RN swallow screen Charlynne) has been documented. Please order BSE if swallow evaluation is desired. RN/MD aware.  Henry Simmons, MSP, CCC-SLP Speech Language Pathologist Office: [phone]  Henry Simmons 08/27/2023, 12:56 PM

## 2023-08-27 NOTE — Procedures (Signed)
 Cortrak  Tube Type:  Cortrak - 43 inches Tube Location:  Left nare Initial Placement:  Stomach Secured by: Bridle Technique Used to Measure Tube Placement:  Marking at nare/corner of mouth Cortrak Secured At:  70 cm   Cortrak Tube Team Note:  Consult received to place a Cortrak feeding tube.   X-ray is required. RN may begin using tube once confirmed in place.   If the tube becomes dislodged please keep the tube and contact the Cortrak team at [email or web address] for replacement.  If after hours and replacement cannot be delayed, place a NG tube and confirm placement with an abdominal x-ray.    Augustin Shams MS, RD, LDN If unable to be reached, please send secure chat to RD inpatient available from 8:00a-4:00p daily

## 2023-08-28 ENCOUNTER — Inpatient Hospital Stay (HOSPITAL_COMMUNITY)

## 2023-08-28 DIAGNOSIS — I63532 Cerebral infarction due to unspecified occlusion or stenosis of left posterior cerebral artery: Secondary | ICD-10-CM | POA: Diagnosis not present

## 2023-08-28 DIAGNOSIS — Z7189 Other specified counseling: Secondary | ICD-10-CM | POA: Diagnosis not present

## 2023-08-28 DIAGNOSIS — R4701 Aphasia: Secondary | ICD-10-CM | POA: Diagnosis not present

## 2023-08-28 DIAGNOSIS — E440 Moderate protein-calorie malnutrition: Secondary | ICD-10-CM | POA: Insufficient documentation

## 2023-08-28 LAB — GLUCOSE, CAPILLARY
Glucose-Capillary: 128 mg/dL — ABNORMAL HIGH (ref 70–99)
Glucose-Capillary: 132 mg/dL — ABNORMAL HIGH (ref 70–99)
Glucose-Capillary: 99 mg/dL (ref 70–99)
Glucose-Capillary: 119 mg/dL — ABNORMAL HIGH (ref 70–99)
Glucose-Capillary: 130 mg/dL — ABNORMAL HIGH (ref 70–99)

## 2023-08-28 LAB — MAGNESIUM: Magnesium: 2 mg/dL (ref 1.7–2.4)

## 2023-08-28 LAB — PHOSPHORUS: Phosphorus: 2.2 mg/dL — ABNORMAL LOW (ref 2.5–4.6)

## 2023-08-28 MED ORDER — METOPROLOL TARTRATE 5 MG/5ML IV SOLN
5.0000 mg | Freq: Three times a day (TID) | INTRAVENOUS | Status: DC
  Administered 2023-08-29 (×2): 5 mg via INTRAVENOUS
  Filled 2023-08-28: qty 5

## 2023-08-28 MED ORDER — SACUBITRIL-VALSARTAN 24-26 MG PO TABS
1.0000 | ORAL_TABLET | Freq: Two times a day (BID) | ORAL | Status: DC
  Administered 2023-08-28 – 2023-08-29 (×3): 1
  Filled 2023-08-28 (×3): qty 1

## 2023-08-28 MED ORDER — CARVEDILOL 6.25 MG PO TABS
6.2500 mg | ORAL_TABLET | Freq: Two times a day (BID) | ORAL | Status: DC
  Administered 2023-08-28 – 2023-08-29 (×2): 6.25 mg via NASOGASTRIC
  Filled 2023-08-28 (×2): qty 1

## 2023-08-28 MED ORDER — METOPROLOL TARTRATE 5 MG/5ML IV SOLN
5.0000 mg | Freq: Three times a day (TID) | INTRAVENOUS | Status: DC
  Administered 2023-08-28 (×2): 5 mg via INTRAVENOUS
  Filled 2023-08-28 (×3): qty 5

## 2023-08-28 MED ORDER — ATORVASTATIN CALCIUM 80 MG PO TABS
80.0000 mg | ORAL_TABLET | Freq: Every day | ORAL | Status: DC
  Administered 2023-08-29: 80 mg
  Filled 2023-08-28: qty 1
  Filled 2023-08-28: qty 2

## 2023-08-28 NOTE — Progress Notes (Signed)
 Heart Failure Navigator Progress Note  Assessed for Heart & Vascular TOC clinic readiness.  Patient does not meet criteria due to EF 30-35%, per Dr. Claudene patient will have a scheduled appointment with him at discharge. No HF TOC. .   Navigator will sign off at this time.   Stephane Haddock, BSN, Scientist, clinical (histocompatibility and immunogenetics) Only

## 2023-08-28 NOTE — Progress Notes (Signed)
 Inpatient Rehab Admissions Coordinator:   Per therapy recommendations, patient was screened for CIR candidacy by Leita Kleine, MS, CCC-SLP. At this time, Pt. is not at a level to tolerate the intensity of CIR; however,  Pt. may have potential to progress to becoming a potential CIR candidate, so CIR admissions team will follow and monitor for progress and participation with therapies and place consult order if Pt. appears to be an appropriate candidate. Please contact me with any questions.   Leita Kleine, MS, CCC-SLP

## 2023-08-28 NOTE — Progress Notes (Signed)
 Physical Therapy Treatment Patient Details Name: Henry Simmons MRN: [id number] DOB: 18-May-1970 Today's Date: 08/28/2023   History of Present Illness 53 y.o. male [facility] 08/25/23  with worsening level of consciousness. Pt with L PCA CVA and moderate diffuse encephalopathy.PMHx: CAD status post PCI, HFmrEF, hypertension, hyperlipidemia, seizure disorder, and history of left MCA stroke with right hemiparesis    PT Comments  Pt received in supine and demonstrates improved alertness this session. Pt able to follow commands during session with increased time and cues. Increased tension and resistance noted in LUE and LLE during attempts for exercises. BP elevated throughout, however pt unable to relax LUE despite cues. Pt able to sit EOB with mod A and brief period of CGA. Pt able to lean onto L elbow and push back to midline x2 with mod A. Pt continues to benefit from PT services to progress toward functional mobility goals.    If plan is discharge home, recommend the following: A lot of help with walking and/or transfers;A lot of help with bathing/dressing/bathroom;Assistance with cooking/housework;Assist for transportation;Help with stairs or ramp for entrance   Can travel by private vehicle        Equipment Recommendations  None recommended by PT    Recommendations for Other Services       Precautions / Restrictions Precautions Precautions: Fall Recall of Precautions/Restrictions: Impaired Precaution/Restrictions Comments: R hemiparesis at baseline     Mobility  Bed Mobility Overal bed mobility: Needs Assistance Bed Mobility: Rolling, Sidelying to Sit, Sit to Supine Rolling: Total assist, +2 for safety/equipment Sidelying to sit: Max assist, +2 for physical assistance   Sit to supine: Total assist, +2 for physical assistance, +2 for safety/equipment   General bed mobility comments: Pt able to grab rail once rolled on side, but requires assist with all other aspects     Transfers                   General transfer comment: Total A +2 to lateral scoot on EOB    Ambulation/Gait                   Stairs             Wheelchair Mobility     Tilt Bed    Modified Rankin (Stroke Patients Only) Modified Rankin (Stroke Patients Only) Pre-Morbid Rankin Score: Moderately severe disability Modified Rankin: Severe disability     Balance Overall balance assessment: Needs assistance Sitting-balance support: No upper extremity supported, Feet supported, Single extremity supported Sitting balance-Leahy Scale: Poor Sitting balance - Comments: Pt requires mod A progressing to brief CGA. Pt tends to push to R with LUE on bed Postural control: Posterior lean, Right lateral lean                                  Communication Communication Communication: Impaired Factors Affecting Communication: Difficulty expressing self;Reduced clarity of speech (no verbalizations or gestures)  Cognition Arousal: Alert Behavior During Therapy: Flat affect   PT - Cognitive impairments: Difficult to assess Difficult to assess due to: Impaired communication                       Following commands: Impaired Following commands impaired: Follows one step commands inconsistently, Follows one step commands with increased time    Cueing Cueing Techniques: Verbal cues, Tactile cues  Exercises General Exercises - Lower Extremity Long  Arc Quad: AAROM, Seated, Left, 5 reps    General Comments        Pertinent Vitals/Pain Pain Assessment Pain Assessment: Faces Faces Pain Scale: No hurt Pain Intervention(s): Monitored during session     PT Goals (current goals can now be found in the care plan section) Acute Rehab PT Goals Patient Stated Goal: unable to state goal PT Goal Formulation: Patient unable to participate in goal setting Time For Goal Achievement: 09/09/23 Progress towards PT goals: Progressing toward goals     Frequency    Min 2X/week      PT Plan      Co-evaluation PT/OT/SLP Co-Evaluation/Treatment: Yes Reason for Co-Treatment: Necessary to address cognition/behavior during functional activity;For patient/therapist safety;To address functional/ADL transfers PT goals addressed during session: Mobility/safety with mobility;Balance        AM-PAC PT 6 Clicks Mobility   Outcome Measure  Help needed turning from your back to your side while in a flat bed without using bedrails?: Total Help needed moving from lying on your back to sitting on the side of a flat bed without using bedrails?: Total Help needed moving to and from a bed to a chair (including a wheelchair)?: Total Help needed standing up from a chair using your arms (e.g., wheelchair or bedside chair)?: Total Help needed to walk in hospital room?: Total Help needed climbing 3-5 steps with a railing? : Total 6 Click Score: 6    End of Session   Activity Tolerance: Patient tolerated treatment well Patient left: in bed;with call bell/phone within reach;with SCD's reapplied;with bed alarm set Nurse Communication: Mobility status PT Visit Diagnosis: Unsteadiness on feet (R26.81);Other abnormalities of gait and mobility (R26.89);Muscle weakness (generalized) (M62.81);Hemiplegia and hemiparesis Hemiplegia - Right/Left: Right Hemiplegia - caused by: Cerebral infarction     Time: 0837-0900 PT Time Calculation (min) (ACUTE ONLY): 23 min  Charges:    $Therapeutic Activity: 8-22 mins PT General Charges $$ ACUTE PT VISIT: 1 Visit                     Darryle George, PTA Acute Rehabilitation Services Secure Chat Preferred  [phone]    Darryle George 08/28/2023, 9:41 AM

## 2023-08-28 NOTE — Progress Notes (Signed)
 Progress Note   Patient: Henry Simmons FMW:[id number] DOB: Jul 31, 1970 DOA: 08/25/2023     3 DOS: the patient was seen and examined on 08/28/2023   Brief hospital course: 53 year old with past medical history significant for CAD status post PCI, heart failure with moderate reduced ejection fraction, hypertension, hyperlipidemia, seizure disorder, history of left MCA stroke with right hemiparesis at baseline and moderate aphasia present with new left PCA stroke.  Neurology has been consulted, pending placement to inpatient rehab/skilled nursing facility.  Assessment and Plan: Acute Left PCA stroke:  - CT head without contrast acute nonhemorrhagic left PCA territory infarct with hyperdense left posterior cerebral artery consistent with vessel occlusion.  Chronic encephalomalacia of the left MCA territory and right parietal lobe.  - CT angio head and neck: Occlusion of the left posterior cerebral artery at its origin.  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  Moderate to severe stenosis A1 A2, moderate stenosis M2  2D echo ejection fraction 30 to 35%, left posterior basal unusual thickening with definitive aberrant papillary muscle and core, wall motion abnormality -LDL 68, A1c 5.4  -continue with aspirin  and statin - s/p cortrack feeding tube placement on 7/14. -Patient will likely need PEG tube, will need goals of care discussion.   -Palliative care consulted    Lactic acidosis: No acute issues now.   CAD status post PCI: Chronic systolic heart failure with ejection fraction 35% Troponinemia/Type 2 MI in the setting of demand ischemia, HTN emergency and left PCA Stroke: Echo with worsening ejection fraction 35%, generalized wall motion abnormality. Cardiology on board Started on coreg  6.25 mg via NG tube BID along with Entresto  24-26 on 7/15 Will add MRA depending on the BP in the next 1-2 days.   Hypertension:  - as needed  Labetolol. -Coreg  and Entresto  started   Hyperlipidemia: Resume statin when taking orals   Seizure disorder: - Patient was loaded with IV Keppra  -Continue IV Keppra  250 twice daily.      Subjective: Patient is drowsy. Cortrack in place. Getting tube feeds with osmolite at 60 cc/hr.  Physical Exam: Vitals:   08/27/23 2001 08/27/23 2350 08/28/23 0358 08/28/23 0812  BP: (!) 151/106 (!) 144/114 (!) 146/109 (!) 141/111  Pulse: 96 95 97 (!) 112  Resp: 18  20 16   Temp: 98.8 F (37.1 C) 98.6 F (37 C) 98.3 F (36.8 C) 99 F (37.2 C)  TempSrc: Axillary Axillary Axillary Oral  SpO2: 100% 98% 97% 100%  Height:       Constitutional: Appears lethargic, unable to communicate effectively Eyes: PERRL, lids and conjunctivae normal ENMT: Mucous membranes are moist. Posterior pharynx clear of any exudate or lesions.Normal dentition. Cortrak in place Neck: normal, supple, no masses, no thyromegaly Respiratory: clear to auscultation bilaterally, no wheezing, no crackles. Normal respiratory effort. No accessory muscle use.  Cardiovascular: Regular rate and rhythm, no murmurs / rubs / gallops. No extremity edema. 2+ pedal pulses. No carotid bruits.  Abdomen: no tenderness, no masses palpated. No hepatosplenomegaly. Bowel sounds positive.  Musculoskeletal: Right arm is flexed, right wrist in flexed and adducted. Neurologic: Unable to examine motor and sensory systems secondary to altered mentation.  Psychiatric: Lethargic, unable to effectively unable to examine Data Reviewed:  There are no new results to review at this time.  Family Communication: None at the bedside  Disposition: Status is: Inpatient Remains inpatient appropriate because: Altered mentation  Planned Discharge Destination: Rehab    Time spent:  42 minutes  Author: Deliliah Room, MD 08/28/2023 10:18 AM  For on call review [email or web address].

## 2023-08-28 NOTE — Progress Notes (Signed)
 Occupational Therapy Treatment Patient Details Name: Henry Simmons MRN: [id number] DOB: March 07, 1970 Today's Date: 08/28/2023   History of present illness 53 y.o. male presents to [facility] 08/25/23  with worsening level of consciousness. Pt with L PCA CVA and moderate diffuse encephalopathy.PMHx: CAD status post PCI, HFmrEF, hypertension, hyperlipidemia, seizure disorder, and history of left MCA stroke with right hemiparesis   OT comments  Patient received in supine and appeared more alert with right eye open and left eye closed. Patient assisting with bed mobility using LUE to grab bed rail but required max assist +2 to complete. Patient performed sitting balance on with lateral leaning tasks. Patient requiring mod assist for sitting balance with bouts of CGA for limited time. Patient required total assist to return to supine and positioned for comfort. Patient will benefit from intensive inpatient follow-up therapy, >3 hours/day.  Acute OT to continue to follow to address established goals to facilitate DC to next venue of care.        If plan is discharge home, recommend the following:  Assist for transportation;Two people to help with walking and/or transfers;A lot of help with bathing/dressing/bathroom   Equipment Recommendations  None recommended by OT    Recommendations for Other Services      Precautions / Restrictions Precautions Precautions: Fall Recall of Precautions/Restrictions: Impaired Precaution/Restrictions Comments: R hemiparesis at baseline Restrictions Weight Bearing Restrictions Per Provider Order: No       Mobility Bed Mobility Overal bed mobility: Needs Assistance Bed Mobility: Rolling, Sidelying to Sit, Sit to Supine Rolling: Total assist, +2 for safety/equipment Sidelying to sit: Max assist, +2 for physical assistance   Sit to supine: Total assist, +2 for physical assistance, +2 for safety/equipment   General bed mobility comments: patient attempting to assist  wwith grabing rail    Transfers                   General transfer comment: not attempted, total assist +2 for lateral scooting towards HOB     Balance Overall balance assessment: Needs assistance Sitting-balance support: No upper extremity supported, Feet supported, Single extremity supported Sitting balance-Leahy Scale: Poor Sitting balance - Comments: Pt requires mod A progressing to brief CGA. Pt tends to push to R with LUE on bed Postural control: Posterior lean, Right lateral lean                                 ADL either performed or assessed with clinical judgement   ADL Overall ADL's : Needs assistance/impaired     Grooming: Total assistance;Bed level Grooming Details (indicate cue type and reason): face washing attempted with hand over hand but patient unable to perform                               General ADL Comments: focused on sitting balance on EOB    Extremity/Trunk Assessment Upper Extremity Assessment Upper Extremity Assessment: Right hand dominant RUE Deficits / Details: limites AROM to RUE before recent hospital stay RUE Sensation: decreased light touch RUE Coordination: decreased fine motor;decreased gross motor            Vision       Perception     Praxis     Communication Communication Communication: Impaired Factors Affecting Communication: Difficulty expressing self;Reduced clarity of speech (no verbalizations or gestures)   Cognition Arousal: Alert Behavior During  Therapy: Flat affect Cognition: Difficult to assess Difficult to assess due to: Impaired communication           OT - Cognition Comments: improvement with following commands                 Following commands: Impaired Following commands impaired: Follows one step commands inconsistently, Follows one step commands with increased time      Cueing   Cueing Techniques: Verbal cues, Tactile cues  Exercises      Shoulder  Instructions       General Comments RUE elbow flexed during session    Pertinent Vitals/ Pain       Pain Assessment Pain Assessment: Faces Faces Pain Scale: No hurt Pain Intervention(s): Monitored during session  Home Living     Available Help at Discharge: Family;Available 24 hours/day Type of Home: House                              Lives With: Spouse    Prior Functioning/Environment              Frequency  Min 2X/week        Progress Toward Goals  OT Goals(current goals can now be found in the care plan section)  Progress towards OT goals: Progressing toward goals  Acute Rehab OT Goals OT Goal Formulation: Patient unable to participate in goal setting Time For Goal Achievement: 09/10/23 Potential to Achieve Goals: Fair ADL Goals Pt Will Perform Eating: with set-up;bed level Pt Will Perform Grooming: with set-up;bed level  Plan      Co-evaluation    PT/OT/SLP Co-Evaluation/Treatment: Yes Reason for Co-Treatment: Necessary to address cognition/behavior during functional activity;For patient/therapist safety;To address functional/ADL transfers PT goals addressed during session: Mobility/safety with mobility;Balance OT goals addressed during session: ADL's and self-care      AM-PAC OT 6 Clicks Daily Activity     Outcome Measure   Help from another person eating meals?: Total Help from another person taking care of personal grooming?: Total Help from another person toileting, which includes using toliet, bedpan, or urinal?: Total Help from another person bathing (including washing, rinsing, drying)?: Total Help from another person to put on and taking off regular upper body clothing?: Total Help from another person to put on and taking off regular lower body clothing?: Total 6 Click Score: 6    End of Session    OT Visit Diagnosis: Other symptoms and signs involving cognitive function   Activity Tolerance Patient tolerated  treatment well   Patient Left in bed;with call bell/phone within reach;with bed alarm set   Nurse Communication Mobility status        Time: 9162-9099 OT Time Calculation (min): 23 min  Charges: OT General Charges $OT Visit: 1 Visit OT Treatments $Therapeutic Activity: 8-22 mins  Dick Laine, OTA Acute Rehabilitation Services  Office [phone]   Jeb LITTIE Laine 08/28/2023, 11:56 AM

## 2023-08-28 NOTE — Consult Note (Signed)
 Consultation Note Date: 08/28/2023   Patient Name: Henry Simmons  DOB: February 07, 1971  MRN: [id number]  Age / Sex: 53 y.o., male  PCP: Henry Pries, NP Referring Physician: Dino Antu, MD  Reason for Consultation:  goals of care  HPI/Patient Profile: 53 y.o. male  with past medical history of CKD, CAD, HTN, HLD, CHF, LMCA stroke with residual R hemiparesis and mod aphasia admitted on 08/25/2023 with being found obtunded. Workup revealed new L PCA stroke with L PCA occlusion. His mental status has remained poor, partially opens eyes to noxious stimuli, nonverbal. Not following commands. Palliative medicine consulted for GOC.     Primary Decision Maker NEXT OF KIN - spouse- Henry Simmons  Discussion: Chart reviewed including labs, progress notes, imaging from this and previous encounters. MRI reviewed - noted large stroke in L PCA area. There is also infarction in the R thalmus.  MRI report also indicates severe chronic small vessel disease with numerous foci of cortical and subcortical microhemorrhage, highly suggestive of cerebral amyloid angiopathy.  CT angio indicated occlusion of L PCA and near occlusive stenosis of the R vertebral artery.  ECHO this admission shows EF 30-35%. EEG showing no seizures.  Per nuero note- NIH score on admission was 27. Patient with high degree of disability prior to stroke, new stroke likely to worsen disability.  I met with patient's spouse, Henry Simmons. She and Henry Simmons have been married for 10 years. They have a blended family. Henry Simmons was known for his outgoing personality.  Henry Simmons has good understanding of Henry Simmons's medical situation. Discussed large stroke and disability. Advanced care Planning- 45 mins-  With Henry Simmons's permission ACP was discussed at bedside.  Henry Simmons is clear that [name] would not want his life prolonged in the state that he is now. He would not want  artificial life support and would not want PEG tube.  We discussed option for transition to comfort measures and allowing natural peaceful dying process. Discussed transition to comfort measures only which includes stopping IV fluids, antibiotics, labs and providing symptom management for SOB, anxiety, nausea, vomiting, and other symptoms of dying.  Henry Simmons agrees with comfort and hospice referral- however, she is worried about [name]'s mother and children. They are likely to disagree. She is aware that as his spouse she is the legal decision maker and is prepared to make decisions, but she wants to try and limit the amount of upset to his family.  I offered to coordinate a family meeting and she is in agreement.  We discussed his code status. Henry Simmons shares that Henry Simmons would not want attempts at resuscitation. I recommended DNR code status and Henry Simmons agrees, however, she requests that purple bracelet not be placed as his other family members will see it and become upset.     SUMMARY OF RECOMMENDATIONS -New large stroke in the setting of old stroke that caused previous significant disability- change code status to DNR- limited interventions- nursing order placed to NOT put purple bracelet on patient to avoid family upset -Plan to meet tomorrow at 11am  with full family- spouse is requesting comfort measures and hospice and requests PMT to inform rest of family   Code Status/Advance Care Planning:   Code Status: Limited: Do not attempt resuscitation (DNR) -DNR-LIMITED -Do Not Intubate/DNI     Prognosis:   < 2 weeks  Discharge Planning: To Be Determined  Primary Diagnoses: Present on Admission:  CVA (cerebral vascular accident) (HCC)   Review of Systems  Unable to perform ROS: Mental status change    Physical Exam Vitals and nursing note reviewed.  Constitutional:      Appearance: He is normal weight.  HENT:     Nose:     Comments: Cortrak in place Neurological:     Comments:  unresponsive     Vital Signs: BP 119/84 (BP Location: Left Arm)   Pulse 80   Temp (!) 97.5 F (36.4 C) (Oral)   Resp 16   Ht 6' 2 (1.88 m)   SpO2 100%   BMI 22.65 kg/m  Pain Scale: Faces       SpO2: SpO2: 100 % O2 Device:SpO2: 100 % O2 Flow Rate: .   IO: Intake/output summary:  Intake/Output Summary (Last 24 hours) at 08/28/2023 1444 Last data filed at 08/28/2023 1101 Gross per 24 hour  Intake 799.33 ml  Output --  Net 799.33 ml    LBM: Last BM Date : 08/25/23 Baseline Weight:   Most recent weight:         Thank you for this consult. Palliative medicine will continue to follow and assist as needed.  Time Total:  Signed by: Henry Simmons, AGNP-C Palliative Medicine  Time includes:   Preparing to see the patient (e.g., review of tests) Obtaining and/or reviewing separately obtained history Performing a medically necessary appropriate examination and/or evaluation Counseling and educating the patient/family/caregiver Ordering medications, tests, or procedures Referring and communicating with other health care professionals (when not reported separately) Documenting clinical information in the electronic or other health record Independently interpreting results (not reported separately) and communicating results to the patient/family/caregiver Care coordination (not reported separately) Clinical documentation   Please contact Palliative Medicine Team phone at [phone] for questions and concerns.  For individual provider: See Tracey
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
## 2023-08-29 DIAGNOSIS — Z7189 Other specified counseling: Secondary | ICD-10-CM | POA: Diagnosis not present

## 2023-08-29 DIAGNOSIS — I63532 Cerebral infarction due to unspecified occlusion or stenosis of left posterior cerebral artery: Secondary | ICD-10-CM | POA: Diagnosis not present

## 2023-08-29 LAB — MAGNESIUM: Magnesium: 2 mg/dL (ref 1.7–2.4)

## 2023-08-29 LAB — GLUCOSE, CAPILLARY
Glucose-Capillary: 128 mg/dL — ABNORMAL HIGH (ref 70–99)
Glucose-Capillary: 134 mg/dL — ABNORMAL HIGH (ref 70–99)
Glucose-Capillary: 110 mg/dL — ABNORMAL HIGH (ref 70–99)

## 2023-08-29 LAB — CULTURE, BLOOD (ROUTINE X 2)

## 2023-08-29 LAB — PHOSPHORUS: Phosphorus: 3 mg/dL (ref 2.5–4.6)

## 2023-08-29 MED ORDER — LOSARTAN POTASSIUM 25 MG PO TABS: 25.0000 mg | ORAL_TABLET | Freq: Every day | ORAL | Status: DC

## 2023-08-29 MED ORDER — HALOPERIDOL LACTATE 5 MG/ML IJ SOLN: 0.5000 mg | INTRAMUSCULAR | Status: DC | PRN

## 2023-08-29 MED ORDER — ONDANSETRON 4 MG PO TBDP: 4.0000 mg | ORAL_TABLET | Freq: Four times a day (QID) | ORAL | Status: DC | PRN

## 2023-08-29 MED ORDER — ACETAMINOPHEN 650 MG RE SUPP: 650.0000 mg | Freq: Four times a day (QID) | RECTAL | Status: DC | PRN

## 2023-08-29 MED ORDER — POLYVINYL ALCOHOL 1.4 % OP SOLN
1.0000 [drp] | Freq: Four times a day (QID) | OPHTHALMIC | Status: DC | PRN
Start: 2023-08-29 — End: 2023-09-08
  Administered 2023-09-01 – 2023-09-04 (×3): 1 [drp] via OPHTHALMIC
  Filled 2023-08-29: qty 15

## 2023-08-29 MED ORDER — ISOSORBIDE DINITRATE 10 MG PO TABS
10.0000 mg | ORAL_TABLET | Freq: Two times a day (BID) | ORAL | Status: DC
  Filled 2023-08-29: qty 1

## 2023-08-29 MED ORDER — HYDRALAZINE HCL 20 MG/ML IJ SOLN: 10.0000 mg | Freq: Four times a day (QID) | INTRAMUSCULAR | Status: DC | PRN

## 2023-08-29 MED ORDER — GLYCOPYRROLATE 1 MG PO TABS
1.0000 mg | ORAL_TABLET | ORAL | Status: DC | PRN
Start: 2023-08-29 — End: 2023-09-08

## 2023-08-29 MED ORDER — MORPHINE SULFATE (PF) 2 MG/ML IV SOLN
1.0000 mg | INTRAVENOUS | Status: DC | PRN
  Administered 2023-09-01 – 2023-09-08 (×3): 2 mg via INTRAVENOUS
  Filled 2023-08-29 (×3): qty 1

## 2023-08-29 MED ORDER — ONDANSETRON HCL 4 MG/2ML IJ SOLN
4.0000 mg | Freq: Four times a day (QID) | INTRAMUSCULAR | Status: DC | PRN
  Administered 2023-09-01: 4 mg via INTRAVENOUS
  Filled 2023-08-29: qty 2

## 2023-08-29 MED ORDER — GLYCOPYRROLATE 0.2 MG/ML IJ SOLN: 0.2000 mg | INTRAMUSCULAR | Status: DC | PRN

## 2023-08-29 MED ORDER — ACETAMINOPHEN 325 MG PO TABS: 650.0000 mg | ORAL_TABLET | Freq: Four times a day (QID) | ORAL | Status: DC | PRN

## 2023-08-29 MED ORDER — HYDRALAZINE HCL 25 MG PO TABS: 25.0000 mg | ORAL_TABLET | Freq: Three times a day (TID) | ORAL | Status: DC

## 2023-08-29 MED ORDER — PANTOPRAZOLE SODIUM 40 MG IV SOLR
40.0000 mg | Freq: Every day | INTRAVENOUS | Status: DC
  Administered 2023-08-29: 40 mg via INTRAVENOUS
  Filled 2023-08-29: qty 10

## 2023-08-29 MED ORDER — HALOPERIDOL 0.5 MG PO TABS: 0.5000 mg | ORAL_TABLET | ORAL | Status: DC | PRN

## 2023-08-29 MED ORDER — METOPROLOL TARTRATE 5 MG/5ML IV SOLN
5.0000 mg | Freq: Two times a day (BID) | INTRAVENOUS | Status: DC
  Administered 2023-08-29: 5 mg via INTRAVENOUS
  Filled 2023-08-29: qty 5

## 2023-08-29 MED ORDER — HALOPERIDOL LACTATE 2 MG/ML PO CONC: 0.5000 mg | ORAL | Status: DC | PRN

## 2023-08-29 NOTE — Progress Notes (Signed)
 Daily Progress Note   Patient Name: Henry Simmons       Date: 08/29/2023 DOB: 1971/01/14  Age: 53 y.o. MRN#: [id number] Attending Physician: Dino Antu, MD Primary Care Physician: Petrina Pries, NP Admit Date: 08/25/2023  Reason for Consultation/Follow-up: Establishing goals of care  Patient Profile/HPI:  53 y.o. male  with past medical history of CKD, CAD, HTN, HLD, CHF, LMCA stroke with residual R hemiparesis and mod aphasia admitted on 08/25/2023 with being found obtunded. Workup revealed new L PCA stroke with L PCA occlusion. His mental status has remained poor, partially opens eyes to noxious stimuli, nonverbal. Not following commands. Palliative medicine consulted for GOC.    Subjective: Chart reviewed including labs, progress notes, imaging from this and previous encounters.  Patient's mental status remains poor. Nonverbal, unable to eat or drink.  Met with large family- patient's spouse, parents, children and siblings were present.  Damiean was known for being the cool guy. Enjoyed keeping his cars neat and clean. Was active in his church. Loving father and grandfather.  Prior to admission he was able to ambulate with assistance. He could not speak due to previous stroke, but family reports he communicated with gestures and seemed generally happy. Was able to feed himself. Mostly homebound except for doctor appointments. Took regular walks with his Dad.  We discussed Rashan's most recent stroke and the effect that it has had on his ability to function. I again reviewed best case scenario post stroke which would include Braydon improving well and rehabilitation. We discussed the worst case which is little improvement and continued complications. I shared that often patient end up  somewhere in the middle requiring a new level of care often for the rest of their life.  We discussed Cleophas's poor baseline functional status and that any recovery would not be even back to where he was prior. I reviewed per Dr. Bucky note that patient would likely be bedbound, dependent for care, and require long term artificial feeding tube.  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  His spouse and family believe he would not wish his life to be prolonged artificially in this state.  Discussed transition to comfort measures only which includes stopping IV fluids, antibiotics, labs and providing symptom management for SOB, anxiety, nausea, vomiting, and other  symptoms of dying.  Family requests evaluation for hospice- they request [facility] by name.   Review of Systems  Unable to perform ROS: Mental status change     Physical Exam Vitals and nursing note reviewed.  Constitutional:      General: He is not in acute distress.    Appearance: He is ill-appearing.  HENT:     Nose:     Comments: Cortrak in place Cardiovascular:     Rate and Rhythm: Normal rate.  Pulmonary:     Effort: Pulmonary effort is normal.  Neurological:     Comments: Nonverbal, responsive to noxious stimuli             Vital Signs: BP (!) 151/110 (BP Location: Left Arm)   Pulse 88   Temp 98.4 F (36.9 C) (Rectal)   Resp 12   Ht 6' 2 (1.88 m)   Wt 61.4 kg   SpO2 100%   BMI 17.38 kg/m  SpO2: SpO2: 100 % O2 Device: O2 Device: Room Air O2 Flow Rate:    Intake/output summary:  Intake/Output Summary (Last 24 hours) at 08/29/2023 1213 Last data filed at 08/29/2023 1008 Gross per 24 hour  Intake 2254.67 ml  Output 1050 ml  Net 1204.67 ml   LBM: Last BM Date : 08/25/23 Baseline Weight: Weight: 61.4 kg Most recent weight: Weight: 61.4 kg       Palliative Assessment/Data: PPS: 10%      Patient Active Problem List   Diagnosis Date Noted   Malnutrition of  moderate degree 08/28/2023   CVA (cerebral vascular accident) (HCC) 08/25/2023   Primary hypertension 07/17/2023   Neurologic deficit due to old ischemic stroke 04/06/2023   Atrioventricular block, complete (HCC) 01/13/2023   Stenosis of artery (HCC) 01/13/2023   Stage 3a chronic kidney disease (HCC) 01/13/2023   Seizure (HCC) 01/13/2023   Encounter for general adult medical examination w/o abnormal findings 01/13/2023   Screening for colon cancer 01/13/2023   Screening for prostate cancer 01/13/2023   Herpes zoster vaccination declined 01/13/2023   Hypertensive nephropathy 01/13/2023   Status epilepticus (HCC) 12/10/2022   Lactic acidosis 12/10/2022   Thrombocytopenia (HCC) 12/10/2022   CAD (coronary artery disease) 12/10/2022   Chronic heart failure (HCC) 12/10/2022   Tobacco dependence 10/06/2022   Aphasia as late effect of stroke 10/06/2022   Need for Tdap vaccination 10/06/2022   Need for hepatitis C screening test 10/06/2022   Acute ischemic left MCA stroke (HCC) 06/14/2021   Right hemiplegia (HCC) 06/14/2021   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   Cerebral embolism with cerebral infarction 06/09/2021   NSTEMI (non-ST elevated myocardial infarction) (HCC) 06/07/2021   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   Heart block AV second degree    Ischemic cardiomyopathy 12/01/2020   Non-ST elevation (NSTEMI) myocardial infarction (HCC) 11/29/2020   Cerebral infarction (HCC) 10/24/2012   Dyslipidemia, goal LDL below 100 10/24/2012   Essential hypertension, malignant 09/15/2012   Right sided weakness 09/15/2012   History of CVA (cerebrovascular accident) without residual deficits 09/15/2012   Acute left ACA ischemic stroke (HCC) 09/15/2012   Tobacco use 09/15/2012   Benign hypertension with CKD (chronic kidney disease) stage III (HCC) 10/26/2010    Palliative Care Assessment & Plan     Assessment/Recommendations/Plan  -New large stroke in the setting of old stroke that caused previous significant disability- change code status to DNR- limited interventions- nursing order placed to NOT put  purple bracelet on patient to avoid family upset Transition to comfort measures only- morphine  IV and Keppra  IV prn, diazepam  IV prn, robinul  IV prn, haldol  IV prn- patient cannot take po medications -TOC order placed for referral to hospice- family requests [facility] if possible  Code Status:   Code Status: Do not attempt resuscitation (DNR) - Comfort care   Prognosis:  < 2 weeks  Discharge Planning: To Be Determined  Care plan was discussed with patient's family, attending provider Dr. Dino, transition of care team, and bedside RN Nelwyn  Thank you for allowing the Palliative Medicine Team to assist in the care of this patient.  Total time:  65 minutes Prolonged billing:  Time includes:   Preparing to see the patient (e.g., review of tests) Obtaining and/or reviewing separately obtained history Performing a medically necessary appropriate examination and/or evaluation Counseling and educating the patient/family/caregiver Ordering medications, tests, or procedures Referring and communicating with other health care professionals (when not reported separately) Documenting clinical information in the electronic or other health record Independently interpreting results (not reported separately) and communicating results to the patient/family/caregiver Care coordination (not reported separately) Clinical documentation  Cassondra Stain, AGNP-C Palliative Medicine   Please contact Palliative Medicine Team phone at [phone] for questions and concerns.

## 2023-08-29 NOTE — TOC Progression Note (Signed)
 Transition of Care [facility]) - Progression Note    Patient Details  Name: RYEN RHAMES MRN: [id number] Date of Birth: [birth year]  Transition of Care [facility]) CM/SW Contact  Andrez JULIANNA George, RN Phone Number: 08/29/2023, 12:21 PM  Clinical Narrative:     Per palliative care pt's family has decided on residential hospice. CM has sent the referral to [facility] for [facility] per family request.  IP Care management following.   Expected Discharge Plan: Skilled Nursing Facility Barriers to Discharge: Continued Medical Work up  Expected Discharge Plan and Services In-house Referral: Clinical Social Work Discharge Planning Services: CM Consult Post Acute Care Choice: Skilled Nursing Facility Living arrangements for the past 2 months: Single Family Home                                       Social Determinants of Health (SDOH) Interventions SDOH Screenings   Food Insecurity: No Food Insecurity (08/26/2023)  Housing: Unknown (08/26/2023)  Transportation Needs: No Transportation Needs (08/26/2023)  Utilities: Not At Risk (08/26/2023)  Depression (PHQ2-9): Low Risk  (07/05/2023)  Tobacco Use: High Risk (08/26/2023)    Readmission Risk Interventions     No data to display

## 2023-08-29 NOTE — Progress Notes (Signed)
 Progress Note   Patient: Henry Simmons FMW:[id number] DOB: 05/13/1970 DOA: 08/25/2023     4 DOS: the patient was seen and examined on 08/29/2023   Brief hospital course: 53 year old with past medical history significant for CAD status post PCI, heart failure with moderate reduced ejection fraction, hypertension, hyperlipidemia, seizure disorder, history of left MCA stroke with right hemiparesis at baseline and moderate aphasia present with new left PCA stroke.  Neurology has been consulted, pending placement to inpatient rehab/skilled nursing facility.  Assessment and Plan: Acute Left PCA stroke Prior left MCA territory stroke:  - CT head without contrast acute nonhemorrhagic left PCA territory infarct with hyperdense left posterior cerebral artery consistent with vessel occlusion.  Chronic encephalomalacia of the left MCA territory and right parietal lobe.  - CT angio head and neck: Occlusion of the left posterior cerebral artery at its origin.  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  Moderate to severe stenosis A1 A2, moderate stenosis M2  2D echo ejection fraction 30 to 35%, left posterior basal unusual thickening with definitive aberrant papillary muscle and core, wall motion abnormality -LDL 68, A1c 5.4  -continue with aspirin  and statin - s/p cortrack feeding tube (left nostril) placement on 7/14.  -Palliative care consulted- DNR/DNI status, family leaning towards comfort/hospice.   CAD status post PCI: Chronic systolic heart failure with ejection fraction 35% Troponinemia/Type 2 MI in the setting of demand ischemia, HTN emergency and left PCA Stroke: Echo with worsening ejection fraction 35%, generalized wall motion abnormality. Cardiology on board Started on coreg  6.25 mg via NG tube BID along with Entresto  24-26 on 7/15   Hypertension:  - as needed Labetolol. -Coreg  and Entresto  started   Hyperlipidemia: Resumed statin    Seizure disorder: - Patient was loaded with IV Keppra  -Continue IV Keppra  250 twice daily.  Poor prognosis overall.      Subjective: Patient is altered. Cortrack in place in Left nostril. Getting tube feeds with osmolite at 60 cc/hr.  Physical Exam: Vitals:   08/29/23 0012 08/29/23 0313 08/29/23 0531 08/29/23 0749  BP: (!) 149/98 (!) 145/92  (!) 151/110  Pulse: 96 82  93  Resp:  18  12  Temp:  98.5 F (36.9 C)  98.6 F (37 C)  TempSrc:  Oral  Oral  SpO2:  99%  100%  Weight:   61.4 kg   Height:       Constitutional: Appears lethargic, unable to communicate effectively Eyes: PERRL, lids and conjunctivae normal ENMT: Mucous membranes are moist. Posterior pharynx clear of any exudate or lesions.Normal dentition. Cortrak in place Neck: normal, supple, no masses, no thyromegaly Respiratory: clear to auscultation bilaterally, no wheezing, no crackles. Normal respiratory effort. No accessory muscle use.  Cardiovascular: Regular rate and rhythm, no murmurs / rubs / gallops. No extremity edema. 2+ pedal pulses. No carotid bruits.  Abdomen: no tenderness, no masses palpated. No hepatosplenomegaly. Bowel sounds positive.  Musculoskeletal: Right arm is flexed, right wrist in flexed and adducted. Neurologic: Unable to examine motor and sensory systems secondary to altered mentation.  Psychiatric: Lethargic, unable to effectively unable to assess   Data Reviewed:  There are no new results to review at this time.  Family Communication: None at the bedside  Disposition: Status is: Inpatient Remains inpatient appropriate because: Altered mentation  Planned Discharge Destination: Rehab    Time spent: 43 minutes  Author: Deliliah Room, MD 08/29/2023 8:58 AM  For on call review [email or web address].

## 2023-08-29 NOTE — Plan of Care (Signed)
  Problem: Nutrition: Goal: Risk of aspiration will decrease Outcome: Progressing Goal: Dietary intake will improve Outcome: Progressing   Problem: Clinical Measurements: Goal: Ability to maintain clinical measurements within normal limits will improve Outcome: Progressing Goal: Will remain free from infection Outcome: Progressing Goal: Diagnostic test results will improve Outcome: Progressing Goal: Respiratory complications will improve Outcome: Progressing Goal: Cardiovascular complication will be avoided Outcome: Progressing   Problem: Elimination: Goal: Will not experience complications related to bowel motility Outcome: Progressing Goal: Will not experience complications related to urinary retention Outcome: Progressing   Problem: Safety: Goal: Ability to remain free from injury will improve Outcome: Progressing   Problem: Skin Integrity: Goal: Risk for impaired skin integrity will decrease Outcome: Progressing

## 2023-08-29 NOTE — Plan of Care (Signed)
  Problem: Education: Goal: Knowledge of disease or condition will improve Outcome: Progressing Goal: Knowledge of secondary prevention will improve (MUST DOCUMENT ALL) Outcome: Progressing Goal: Knowledge of patient specific risk factors will improve (DELETE if not current risk factor) Outcome: Progressing   Problem: Ischemic Stroke/TIA Tissue Perfusion: Goal: Complications of ischemic stroke/TIA will be minimized Outcome: Progressing   Problem: Coping: Goal: Will verbalize positive feelings about self Outcome: Progressing Goal: Will identify appropriate support needs Outcome: Progressing   Problem: Health Behavior/Discharge Planning: Goal: Ability to manage health-related needs will improve Outcome: Progressing Goal: Goals will be collaboratively established with patient/family Outcome: Progressing   Problem: Self-Care: Goal: Ability to participate in self-care as condition permits will improve Outcome: Progressing Goal: Verbalization of feelings and concerns over difficulty with self-care will improve Outcome: Progressing Goal: Ability to communicate needs accurately will improve Outcome: Progressing   Problem: Nutrition: Goal: Risk of aspiration will decrease Outcome: Progressing Goal: Dietary intake will improve Outcome: Progressing   Problem: Education: Goal: Knowledge of General Education information will improve Description: Including pain rating scale, medication(s)/side effects and non-pharmacologic comfort measures Outcome: Progressing   Problem: Health Behavior/Discharge Planning: Goal: Ability to manage health-related needs will improve Outcome: Progressing   Problem: Clinical Measurements: Goal: Ability to maintain clinical measurements within normal limits will improve Outcome: Progressing Goal: Will remain free from infection Outcome: Progressing Goal: Diagnostic test results will improve Outcome: Progressing Goal: Respiratory complications will  improve Outcome: Progressing Goal: Cardiovascular complication will be avoided Outcome: Progressing   Problem: Nutrition: Goal: Adequate nutrition will be maintained Outcome: Progressing   Problem: Activity: Goal: Risk for activity intolerance will decrease Outcome: Progressing   Problem: Coping: Goal: Level of anxiety will decrease Outcome: Progressing   Problem: Elimination: Goal: Will not experience complications related to bowel motility Outcome: Progressing Goal: Will not experience complications related to urinary retention Outcome: Progressing   Problem: Pain Managment: Goal: General experience of comfort will improve and/or be controlled Outcome: Progressing   Problem: Safety: Goal: Ability to remain free from injury will improve Outcome: Progressing   Problem: Skin Integrity: Goal: Risk for impaired skin integrity will decrease Outcome: Progressing   Problem: Education: Goal: Knowledge of the prescribed therapeutic regimen will improve Outcome: Progressing   Problem: Coping: Goal: Ability to identify and develop effective coping behavior will improve Outcome: Progressing   Problem: Clinical Measurements: Goal: Quality of life will improve Outcome: Progressing   Problem: Respiratory: Goal: Verbalizations of increased ease of respirations will increase Outcome: Progressing

## 2023-08-29 NOTE — Consult Note (Signed)
 Ref: Petrina Pries, NP   Subjective:  Awake. Not able to communicate. VS stable.  Objective:  Vital Signs in the last 24 hours: Temp:  [97.6 F (36.4 C)-98.8 F (37.1 C)] 98.4 F (36.9 C) (07/16 1134) Pulse Rate:  [82-97] 88 (07/16 1134) Cardiac Rhythm: Normal sinus rhythm (07/16 0815) Resp:  [12-18] 12 (07/16 0749) BP: (136-168)/(92-111) 151/110 (07/16 0749) SpO2:  [97 %-100 %] 100 % (07/16 1134) Weight:  [61.4 kg] 61.4 kg (07/16 0531)  Physical Exam: BP Readings from Last 1 Encounters:  08/29/23 (!) 151/110     Wt Readings from Last 1 Encounters:  08/29/23 61.4 kg    Weight change:  Body mass index is 17.38 kg/m. HEENT: [place]/AT, Eyes-Brown, Conjunctiva-Pink, Sclera-Non-icteric Neck: No JVD, No bruit, Trachea midline. Lungs:  Clear, Bilateral. Cardiac:  Regular rhythm, normal S1 and S2, no S3. II/VI systolic murmur. Abdomen:  Soft, non-tender. BS present. Extremities:  No edema present. No cyanosis. No clubbing. CNS: AxOx1.  Skin: Warm and dry.   Intake/Output from previous day: 07/15 0701 - 07/16 0700 In: 2379.7 [NG/GT:2379.7] Out: 750 [Urine:750]    Lab Results: BMET    Component Value Date/Time   NA 143 08/26/2023 0621   NA 140 08/25/2023 1435   NA 141 08/25/2023 1306   NA 141 08/25/2023 1306   NA 147 (H) 07/05/2023 1223   NA 145 (H) 01/04/2023 1243   NA 145 (H) 10/02/2022 1256   K 3.9 08/26/2023 0621   K 3.3 (L) 08/25/2023 1435   K 6.1 (H) 08/25/2023 1306   K 6.0 (H) 08/25/2023 1306   CL 109 08/26/2023 0621   CL 100 08/25/2023 1435   CL 106 08/25/2023 1306   CO2 18 (L) 08/26/2023 0621   CO2 32 08/25/2023 1435   CO2 24 07/05/2023 1223   GLUCOSE 104 (H) 08/26/2023 0621   GLUCOSE 101 (H) 08/25/2023 1435   GLUCOSE 105 (H) 08/25/2023 1306   BUN 14 08/26/2023 0621   BUN 10 08/25/2023 1435   BUN 16 08/25/2023 1306   BUN 14 07/05/2023 1223   BUN 19 01/04/2023 1243   BUN 16 10/02/2022 1256   CREATININE 1.11 08/26/2023 0621   CREATININE 1.03  08/25/2023 1435   CREATININE 1.10 08/25/2023 1306   CALCIUM  9.5 08/26/2023 0621   CALCIUM  9.6 08/25/2023 1435   CALCIUM  9.7 07/05/2023 1223   GFRNONAA >60 08/26/2023 0621   GFRNONAA >60 08/25/2023 1435   GFRNONAA >60 12/10/2022 0615   GFRAA 79 (L) 10/25/2012 0605   GFRAA 86 (L) 10/24/2012 1954   GFRAA 90 (L) 10/24/2012 0957   CBC    Component Value Date/Time   WBC 6.4 08/25/2023 1251   RBC 5.15 08/25/2023 1251   HGB 15.3 08/25/2023 1306   HGB 15.0 08/25/2023 1306   HGB 13.7 07/05/2023 1223   HCT 45.0 08/25/2023 1306   HCT 44.0 08/25/2023 1306   HCT 43.9 07/05/2023 1223   PLT 166 08/25/2023 1251   PLT 165 07/05/2023 1223   MCV 87.8 08/25/2023 1251   MCV 91 07/05/2023 1223   MCH 27.8 08/25/2023 1251   MCHC 31.6 08/25/2023 1251   RDW 13.6 08/25/2023 1251   RDW 12.8 07/05/2023 1223   LYMPHSABS 1.3 08/25/2023 1251   LYMPHSABS 1.4 07/05/2023 1223   MONOABS 0.6 08/25/2023 1251   EOSABS 0.1 08/25/2023 1251   EOSABS 0.1 07/05/2023 1223   BASOSABS 0.0 08/25/2023 1251   BASOSABS 0.0 07/05/2023 1223   HEPATIC Function Panel Recent Labs  12/10/22 0404 01/04/23 1243 08/25/23 1435  PROT 6.7 6.6 6.8  ALBUMIN 3.9 4.6 3.9  AST 30 20 30   ALT 37 31 59*  ALKPHOS 67 82 62   HEMOGLOBIN A1C Lab Results  Component Value Date   MPG 111 08/25/2023   CARDIAC ENZYMES Lab Results  Component Value Date   CKTOTAL 319 (H) 09/18/2010   CKMB 2.0 09/18/2010   TROPONINI <0.30 10/24/2012   TROPONINI <0.30 09/15/2012   TROPONINI <0.30 09/18/2010   BNP No results for input(s): PROBNP in the last 8760 hours. TSH Recent Labs    08/25/23 1253  TSH 0.346*   CHOLESTEROL Recent Labs    10/02/22 1256 01/04/23 1243 08/26/23 0621  CHOL 113 117 102    Scheduled Meds:  levETIRAcetam   250 mg Intravenous Q12H   mouth rinse  15 mL Mouth Rinse 4 times per day   pantoprazole  (PROTONIX ) IV  40 mg Intravenous Daily   Continuous Infusions: PRN Meds:.acetaminophen  **OR**  acetaminophen , artificial tears, glycopyrrolate  **OR** glycopyrrolate  **OR** glycopyrrolate , haloperidol  **OR** haloperidol  **OR** haloperidol  lactate, morphine  injection, ondansetron  **OR** ondansetron  (ZOFRAN ) IV, mouth rinse  Assessment/Plan: Acute left MCA infarct S/P old strokes Abnormal troponin I from demand ischemia Hypertensice urgency Hyperlipidemia Seizure disorder  Plan: Continue medical treatment   LOS: 4 days   Time spent including chart review, lab review, examination, discussion with patient : 30 min   Salena Negri  MD  08/29/2023, 4:25 PM

## 2023-08-29 NOTE — Consult Note (Signed)
 Ref: Petrina Pries, NP   Subjective:  NG feeding has started. VS stable except uncontrolled hypertension.  Objective:  Vital Signs in the last 24 hours: Temp:  [97.6 F (36.4 C)-98.6 F (37 C)] 98.4 F (36.9 C) (07/16 1134) Pulse Rate:  [82-97] 88 (07/16 1134) Cardiac Rhythm: Normal sinus rhythm (07/16 0815) Resp:  [12-18] 12 (07/16 0749) BP: (143-168)/(92-111) 151/110 (07/16 0749) SpO2:  [97 %-100 %] 100 % (07/16 1134) Weight:  [61.4 kg] 61.4 kg (07/16 0531)  Physical Exam: BP Readings from Last 1 Encounters:  08/29/23 (!) 151/110     Wt Readings from Last 1 Encounters:  08/29/23 61.4 kg    Weight change:  Body mass index is 17.38 kg/m. HEENT: [place]/AT, Eyes-Brown, Conjunctiva-Pink, Sclera-Non-icteric Neck: No JVD, No bruit, Trachea midline. Lungs:  Clear, Bilateral. Cardiac:  Regular rhythm, normal S1 and S2, no S3. II/VI systolic murmur. Abdomen:  Soft, non-tender. BS present. Extremities:  No edema present. No cyanosis. No clubbing. CNS: AxOx0  Skin: Warm and dry.   Intake/Output from previous day: 07/15 0701 - 07/16 0700 In: 2379.7 [NG/GT:2379.7] Out: 750 [Urine:750]    Lab Results: BMET    Component Value Date/Time   NA 143 08/26/2023 0621   NA 140 08/25/2023 1435   NA 141 08/25/2023 1306   NA 141 08/25/2023 1306   NA 147 (H) 07/05/2023 1223   NA 145 (H) 01/04/2023 1243   NA 145 (H) 10/02/2022 1256   K 3.9 08/26/2023 0621   K 3.3 (L) 08/25/2023 1435   K 6.1 (H) 08/25/2023 1306   K 6.0 (H) 08/25/2023 1306   CL 109 08/26/2023 0621   CL 100 08/25/2023 1435   CL 106 08/25/2023 1306   CO2 18 (L) 08/26/2023 0621   CO2 32 08/25/2023 1435   CO2 24 07/05/2023 1223   GLUCOSE 104 (H) 08/26/2023 0621   GLUCOSE 101 (H) 08/25/2023 1435   GLUCOSE 105 (H) 08/25/2023 1306   BUN 14 08/26/2023 0621   BUN 10 08/25/2023 1435   BUN 16 08/25/2023 1306   BUN 14 07/05/2023 1223   BUN 19 01/04/2023 1243   BUN 16 10/02/2022 1256   CREATININE 1.11 08/26/2023 0621    CREATININE 1.03 08/25/2023 1435   CREATININE 1.10 08/25/2023 1306   CALCIUM  9.5 08/26/2023 0621   CALCIUM  9.6 08/25/2023 1435   CALCIUM  9.7 07/05/2023 1223   GFRNONAA >60 08/26/2023 0621   GFRNONAA >60 08/25/2023 1435   GFRNONAA >60 12/10/2022 0615   GFRAA 79 (L) 10/25/2012 0605   GFRAA 86 (L) 10/24/2012 1954   GFRAA 90 (L) 10/24/2012 0957   CBC    Component Value Date/Time   WBC 6.4 08/25/2023 1251   RBC 5.15 08/25/2023 1251   HGB 15.3 08/25/2023 1306   HGB 15.0 08/25/2023 1306   HGB 13.7 07/05/2023 1223   HCT 45.0 08/25/2023 1306   HCT 44.0 08/25/2023 1306   HCT 43.9 07/05/2023 1223   PLT 166 08/25/2023 1251   PLT 165 07/05/2023 1223   MCV 87.8 08/25/2023 1251   MCV 91 07/05/2023 1223   MCH 27.8 08/25/2023 1251   MCHC 31.6 08/25/2023 1251   RDW 13.6 08/25/2023 1251   RDW 12.8 07/05/2023 1223   LYMPHSABS 1.3 08/25/2023 1251   LYMPHSABS 1.4 07/05/2023 1223   MONOABS 0.6 08/25/2023 1251   EOSABS 0.1 08/25/2023 1251   EOSABS 0.1 07/05/2023 1223   BASOSABS 0.0 08/25/2023 1251   BASOSABS 0.0 07/05/2023 1223   HEPATIC Function Panel Recent Labs  12/10/22 0404 01/04/23 1243 08/25/23 1435  PROT 6.7 6.6 6.8  ALBUMIN 3.9 4.6 3.9  AST 30 20 30   ALT 37 31 59*  ALKPHOS 67 82 62   HEMOGLOBIN A1C Lab Results  Component Value Date   MPG 111 08/25/2023   CARDIAC ENZYMES Lab Results  Component Value Date   CKTOTAL 319 (H) 09/18/2010   CKMB 2.0 09/18/2010   TROPONINI <0.30 10/24/2012   TROPONINI <0.30 09/15/2012   TROPONINI <0.30 09/18/2010   BNP No results for input(s): PROBNP in the last 8760 hours. TSH Recent Labs    08/25/23 1253  TSH 0.346*   CHOLESTEROL Recent Labs    10/02/22 1256 01/04/23 1243 08/26/23 0621  CHOL 113 117 102    Scheduled Meds:  hydrALAZINE   25 mg Per Tube Q8H   isosorbide  dinitrate  10 mg Per NG tube BID   levETIRAcetam   250 mg Intravenous Q12H   losartan   25 mg Per NG tube Daily   mouth rinse  15 mL Mouth Rinse 4 times  per day   pantoprazole  (PROTONIX ) IV  40 mg Intravenous Daily   Continuous Infusions: PRN Meds:.acetaminophen  **OR** acetaminophen , artificial tears, glycopyrrolate  **OR** glycopyrrolate  **OR** glycopyrrolate , haloperidol  **OR** haloperidol  **OR** haloperidol  lactate, morphine  injection, ondansetron  **OR** ondansetron  (ZOFRAN ) IV, mouth rinse  Assessment/Plan: Acute left MCA infarct S/P old strokes Abnormal troponin I from demand ischemia Hypertensice urgency Hyperlipidemia Seizure disorder  Plan: Appreciate palliative care consult. Start low dose BP medications.   LOS: 4 days   Time spent including chart review, lab review, examination, discussion with patient : 30 min   Henry Negri  MD  08/29/2023, 4:32 PM

## 2023-08-30 DIAGNOSIS — I63532 Cerebral infarction due to unspecified occlusion or stenosis of left posterior cerebral artery: Secondary | ICD-10-CM | POA: Diagnosis not present

## 2023-08-30 DIAGNOSIS — Z515 Encounter for palliative care: Secondary | ICD-10-CM | POA: Diagnosis not present

## 2023-08-30 LAB — CULTURE, BLOOD (ROUTINE X 2)
Culture: NO GROWTH
Special Requests: ADEQUATE

## 2023-08-30 MED ORDER — LEVETIRACETAM (KEPPRA) 500 MG/5 ML ADULT IV PUSH
250.0000 mg | Freq: Two times a day (BID) | INTRAVENOUS | Status: DC
  Administered 2023-08-30 – 2023-09-08 (×19): 250 mg via INTRAVENOUS
  Filled 2023-08-30 (×19): qty 5

## 2023-08-30 MED ORDER — DIAZEPAM 5 MG/ML IJ SOLN: 2.5000 mg | INTRAMUSCULAR | Status: DC | PRN

## 2023-08-30 MED ORDER — PANTOPRAZOLE SODIUM 40 MG IV SOLR
40.0000 mg | Freq: Every day | INTRAVENOUS | Status: DC
  Administered 2023-08-30 – 2023-09-06 (×8): 40 mg via INTRAVENOUS
  Filled 2023-08-30 (×8): qty 10

## 2023-08-30 NOTE — Plan of Care (Signed)
  Problem: Elimination: Goal: Will not experience complications related to bowel motility Outcome: Progressing Goal: Will not experience complications related to urinary retention Outcome: Progressing   Problem: Safety: Goal: Ability to remain free from injury will improve Outcome: Progressing   Problem: Pain Managment: Goal: General experience of comfort will improve and/or be controlled Outcome: Progressing   Problem: Skin Integrity: Goal: Risk for impaired skin integrity will decrease Outcome: Progressing

## 2023-08-30 NOTE — Plan of Care (Signed)
  Problem: Education: Goal: Knowledge of disease or condition will improve Outcome: Not Applicable Goal: Knowledge of secondary prevention will improve (MUST DOCUMENT ALL) Outcome: Not Applicable Goal: Knowledge of patient specific risk factors will improve (DELETE if not current risk factor) Outcome: Not Applicable   Problem: Ischemic Stroke/TIA Tissue Perfusion: Goal: Complications of ischemic stroke/TIA will be minimized Outcome: Not Applicable   Problem: Coping: Goal: Will verbalize positive feelings about self Outcome: Not Applicable Goal: Will identify appropriate support needs Outcome: Not Applicable   Problem: Health Behavior/Discharge Planning: Goal: Ability to manage health-related needs will improve Outcome: Not Applicable Goal: Goals will be collaboratively established with patient/family Outcome: Not Applicable   Problem: Self-Care: Goal: Ability to participate in self-care as condition permits will improve Outcome: Not Applicable Goal: Verbalization of feelings and concerns over difficulty with self-care will improve Outcome: Not Applicable Goal: Ability to communicate needs accurately will improve Outcome: Not Applicable   Problem: Nutrition: Goal: Risk of aspiration will decrease Outcome: Not Applicable Goal: Dietary intake will improve Outcome: Not Applicable   Problem: Education: Goal: Knowledge of General Education information will improve Description: Including pain rating scale, medication(s)/side effects and non-pharmacologic comfort measures Outcome: Not Applicable   Problem: Health Behavior/Discharge Planning: Goal: Ability to manage health-related needs will improve Outcome: Not Applicable   Problem: Clinical Measurements: Goal: Ability to maintain clinical measurements within normal limits will improve Outcome: Not Applicable Goal: Will remain free from infection Outcome: Not Applicable Goal: Diagnostic test results will improve Outcome:  Not Applicable Goal: Respiratory complications will improve Outcome: Not Applicable Goal: Cardiovascular complication will be avoided Outcome: Not Applicable   Problem: Activity: Goal: Risk for activity intolerance will decrease Outcome: Not Applicable   Problem: Nutrition: Goal: Adequate nutrition will be maintained Outcome: Not Applicable   Problem: Coping: Goal: Level of anxiety will decrease Outcome: Not Applicable   Problem: Elimination: Goal: Will not experience complications related to bowel motility Outcome: Not Applicable Goal: Will not experience complications related to urinary retention Outcome: Not Applicable   Problem: Pain Managment: Goal: General experience of comfort will improve and/or be controlled Outcome: Not Applicable   Problem: Safety: Goal: Ability to remain free from injury will improve Outcome: Not Applicable   Problem: Skin Integrity: Goal: Risk for impaired skin integrity will decrease Outcome: Not Applicable   Problem: Education: Goal: Knowledge of the prescribed therapeutic regimen will improve Outcome: Not Applicable   Problem: Coping: Goal: Ability to identify and develop effective coping behavior will improve Outcome: Not Applicable   Problem: Clinical Measurements: Goal: Quality of life will improve Outcome: Not Applicable   Problem: Respiratory: Goal: Verbalizations of increased ease of respirations will increase Outcome: Not Applicable   Problem: Role Relationship: Goal: Family's ability to cope with current situation will improve Outcome: Not Applicable Goal: Ability to verbalize concerns, feelings, and thoughts to partner or family member will improve Outcome: Not Applicable   Problem: Pain Management: Goal: Satisfaction with pain management regimen will improve Outcome: Not Applicable

## 2023-08-30 NOTE — Progress Notes (Signed)
 Daily Progress Note   Patient Name: Henry Simmons       Date: 08/30/2023 DOB: 03-15-1970  Age: 53 y.o. MRN#: [id number] Attending Physician: Dino Antu, MD Primary Care Physician: Petrina Pries, NP Admit Date: 08/25/2023  Reason for Consultation/Follow-up: Establishing goals of care  Patient Profile/HPI:   53 y.o. male  with past medical history of CKD, CAD, HTN, HLD, CHF, LMCA stroke with residual R hemiparesis and mod aphasia admitted on 08/25/2023 with being found obtunded. Workup revealed new L PCA stroke with L PCA occlusion. His mental status has remained poor, partially opens eyes to noxious stimuli, nonverbal. Not following commands. Palliative medicine consulted for GOC.    Subjective: Chart reviewed including labs, progress notes, imaging from this and previous encounters.  Noted patient has been accepted for [facility]- await bed availability.  He has not required any prn medications in the last 24 hours.  Discussed with RN- he has been comfortable. Patient's parents visited today and inquired about feeding tube being discontinued. RN discussed with them role of artificial feeding and hydration at end of life.  There is contention between spouse and parents. Spouse remains Horticulturist, commercial.  On my evaluation patient was awake. L gaze preference. He did not respond to my voice. Did not look in my direction. Appeared comfortable.  No family present when I visited.   Review of Systems  Unable to perform ROS: Mental status change     Physical Exam Vitals and nursing note reviewed.  Constitutional:      Appearance: He is ill-appearing.  Cardiovascular:     Rate and Rhythm: Normal rate.             Vital Signs: BP 125/88 (BP Location: Left Arm)   Pulse 85   Temp (!)  97.5 F (36.4 C) (Oral)   Resp 16   Ht 6' 2 (1.88 m)   Wt 61.4 kg   SpO2 100%   BMI 17.38 kg/m  SpO2: SpO2: 100 % O2 Device: O2 Device: Room Air O2 Flow Rate:    Intake/output summary:  Intake/Output Summary (Last 24 hours) at 08/30/2023 1510 Last data filed at 08/29/2023 1551 Gross per 24 hour  Intake 707 ml  Output --  Net 707 ml   LBM: Last BM Date : 08/25/23 Baseline Weight: Weight: 61.4  kg Most recent weight: Weight: 61.4 kg       Palliative Assessment/Data: PPS: 10%      Patient Active Problem List   Diagnosis Date Noted   Malnutrition of moderate degree 08/28/2023   CVA (cerebral vascular accident) (HCC) 08/25/2023   Primary hypertension 07/17/2023   Neurologic deficit due to old ischemic stroke 04/06/2023   Atrioventricular block, complete (HCC) 01/13/2023   Stenosis of artery (HCC) 01/13/2023   Stage 3a chronic kidney disease (HCC) 01/13/2023   Seizure (HCC) 01/13/2023   Encounter for general adult medical examination w/o abnormal findings 01/13/2023   Screening for colon cancer 01/13/2023   Screening for prostate cancer 01/13/2023   Herpes zoster vaccination declined 01/13/2023   Hypertensive nephropathy 01/13/2023   Status epilepticus (HCC) 12/10/2022   Lactic acidosis 12/10/2022   Thrombocytopenia (HCC) 12/10/2022   CAD (coronary artery disease) 12/10/2022   Chronic heart failure (HCC) 12/10/2022   Tobacco dependence 10/06/2022   Aphasia as late effect of stroke 10/06/2022   Need for Tdap vaccination 10/06/2022   Need for hepatitis C screening test 10/06/2022   Acute ischemic left MCA stroke (HCC) 06/14/2021   Right hemiplegia (HCC) 06/14/2021   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   Cerebral embolism with cerebral infarction 06/09/2021   NSTEMI (non-ST elevated myocardial infarction) (HCC) 06/07/2021   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   Heart block AV second degree     Ischemic cardiomyopathy 12/01/2020   Non-ST elevation (NSTEMI) myocardial infarction (HCC) 11/29/2020   Cerebral infarction (HCC) 10/24/2012   Dyslipidemia, goal LDL below 100 10/24/2012   Essential hypertension, malignant 09/15/2012   Right sided weakness 09/15/2012   History of CVA (cerebrovascular accident) without residual deficits 09/15/2012   Acute left ACA ischemic stroke (HCC) 09/15/2012   Tobacco use 09/15/2012   Benign hypertension with CKD (chronic kidney disease) stage III (HCC) 10/26/2010    Palliative Care Assessment & Plan    Assessment/Recommendations/Plan  -New large stroke in the setting of old stroke that caused previous significant disability-  Transitioned to comfort measures only- continue morphine  IV and Keppra  IV, diazepam  IV prn, robinul  IV prn, haldol  IV prn- patient cannot take po medications -Appreciate TOC and Hospice coordination for disposition  Code Status:   Code Status: Do not attempt resuscitation (DNR) - Comfort care   Prognosis:  < 2 weeks  Discharge Planning: Hospice facility  Care plan was discussed with patient's RN.   Thank you for allowing the Palliative Medicine Team to assist in the care of this patient.   Prolonged billing:  Time includes:   Preparing to see the patient (e.g., review of tests) Obtaining and/or reviewing separately obtained history Performing a medically necessary appropriate examination and/or evaluation Counseling and educating the patient/family/caregiver Ordering medications, tests, or procedures Referring and communicating with other health care professionals (when not reported separately) Documenting clinical information in the electronic or other health record Independently interpreting results (not reported separately) and communicating results to the patient/family/caregiver Care coordination (not reported separately) Clinical documentation  Cassondra Stain, AGNP-C Palliative Medicine   Please  contact Palliative Medicine Team phone at [phone] for questions and concerns.

## 2023-08-30 NOTE — Progress Notes (Signed)
 [facility] [phone] [facility] Liaison Note  Received request from [facility] manager for family interest in [facility]. Eligibility confirmed. Met with patient and family to confirm interest and explain services. Family agreeable to transfer today.  Unfortunately, [facility] is unable to offer a bed today. TOC aware that liaison will follow up tomorrow or sooner if a bed becomes available.    Thank you for allowing us  to participate in this patient's care.  Eleanor Nail, LPN [facility] Liaison [phone]

## 2023-08-30 NOTE — Progress Notes (Signed)
  Progress Note   Patient: Henry Simmons FMW:[id number] DOB: 1970-03-18 DOA: 08/25/2023     5 DOS: the patient was seen and examined on 08/30/2023   Brief hospital course: 53 year old with past medical history significant for CAD status post PCI, heart failure with moderate reduced ejection fraction, hypertension, hyperlipidemia, seizure disorder, history of left MCA stroke with right hemiparesis at baseline and moderate aphasia present with new left PCA stroke.  Transitioned to comfort care as of 7/16.  Assessment and Plan: Acute Left PCA stroke Prior left MCA territory stroke:  - CT head without contrast acute nonhemorrhagic left PCA territory infarct with hyperdense left posterior cerebral artery consistent with vessel occlusion.  Chronic encephalomalacia of the left MCA territory and right parietal lobe.  - CT angio head and neck: Occlusion of the left posterior cerebral artery at its origin.  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  Moderate to severe stenosis A1 A2, moderate stenosis M2  2D echo ejection fraction 30 to 35%, left posterior basal unusual thickening with definitive aberrant papillary muscle and core, wall motion abnormality -LDL 68, A1c 5.4  -DCed aspirin  and statin as he is on comfort care now. - s/p cortrack feeding tube (left nostril) placement on 7/14 with subsequent removal on 7/16.  -Palliative care consulted- DNR/DNI status, transitioned to comfort care as of 7/16.   CAD status post PCI: Chronic systolic heart failure with ejection fraction 35% Troponinemia/Type 2 MI in the setting of demand ischemia, HTN emergency and left PCA Stroke:  Echo with worsening ejection fraction 35%, generalized wall motion abnormality. Cardiology on board Started on coreg  6.25 mg via NG tube BID along with Entresto  24-26 on 7/15   Hypertension:  - on comfort care now   Hyperlipidemia: - on comfort care now   Seizure  disorder: -Continue IV Keppra  250 twice daily.       Subjective: Patient is obtunded. On comfort care now  Physical Exam: Vitals:   08/29/23 0749 08/29/23 1134 08/29/23 1957 08/30/23 0744  BP: (!) 151/110  (!) 120/93 125/88  Pulse: 93 88 88 85  Resp: 12  19 16   Temp: 98.6 F (37 C) 98.4 F (36.9 C) 99.2 F (37.3 C) (!) 97.5 F (36.4 C)  TempSrc: Oral Rectal Axillary Oral  SpO2: 100% 100% 95% 100%  Weight:      Height:       Constitutional: Obtunded,comatose Eyes: PERRL, lids and conjunctivae normal ENMT: Mucous membranes are moist. Posterior pharynx clear of any exudate or lesions.Normal dentition.  Neck: normal, supple, no masses, no thyromegaly Respiratory: Slow breathing Cardiovascular: Regular rate and rhythm, no murmurs / rubs / gallops. No extremity edema. 2+ pedal pulses. No carotid bruits.  Abdomen: no tenderness, no masses palpated. No hepatosplenomegaly. Bowel sounds positive.  Musculoskeletal: Right arm is flexed, right wrist in flexed and adducted. Neurologic: Unable to examine motor and sensory systems secondary to altered mentation.  Psychiatric: Lethargic, unable to effectively unable to assess   Data Reviewed:  There are no new results to review at this time.  Family Communication: None at the bedside  Disposition: Status is: Inpatient Remains inpatient appropriate because: Altered mentation  Planned Discharge Destination: Rehab    Time spent: 38 minutes  Author: Deliliah Room, MD 08/30/2023 10:08 AM  For on call review [email or web address].

## 2023-08-30 NOTE — Progress Notes (Signed)
 Nutrition Brief Note  Chart reviewed. Pt now transitioning to comfort care.  No further nutrition interventions planned at this time.  Please re-consult as needed.   Vernell Lukes, RD, LDN, CNSC Registered Dietitian II Please reach out via secure chat

## 2023-08-30 NOTE — Progress Notes (Signed)
 Unable to assess, patient is nonverbal.

## 2023-08-31 DIAGNOSIS — I639 Cerebral infarction, unspecified: Secondary | ICD-10-CM | POA: Diagnosis not present

## 2023-08-31 DIAGNOSIS — Z7189 Other specified counseling: Secondary | ICD-10-CM | POA: Diagnosis not present

## 2023-08-31 DIAGNOSIS — Z515 Encounter for palliative care: Secondary | ICD-10-CM | POA: Diagnosis not present

## 2023-08-31 NOTE — Progress Notes (Signed)
  Progress Note   Patient: Henry Simmons FMW:[id number] DOB: 12/22/1970 DOA: 08/25/2023     6 DOS: the patient was seen and examined on 08/31/2023   Brief hospital course: 53 year old with past medical history significant for CAD status post PCI, heart failure with moderate reduced ejection fraction, hypertension, hyperlipidemia, seizure disorder, history of left MCA stroke with right hemiparesis at baseline and moderate aphasia present with new left PCA stroke.  Transitioned to comfort care as of 7/16.  Assessment and Plan:  On Comfort care. Palliative care on board. On comfort meds.  Acute Left PCA stroke Prior left MCA territory stroke:  - CT head without contrast acute nonhemorrhagic left PCA territory infarct with hyperdense left posterior cerebral artery consistent with vessel occlusion.  Chronic encephalomalacia of the left MCA territory and right parietal lobe.  - CT angio head and neck: Occlusion of the left posterior cerebral artery at its origin.  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  Moderate to severe stenosis A1 A2, moderate stenosis M2  2D echo ejection fraction 30 to 35%, left posterior basal unusual thickening with definitive aberrant papillary muscle and core, wall motion abnormality -LDL 68, A1c 5.4  -DCed aspirin  and statin as he is on comfort care now. - s/p cortrack feeding tube (left nostril) placement on 7/14 with subsequent removal on 7/16.  -Palliative care consulted- DNR/DNI status, transitioned to comfort care as of 7/16.   CAD status post PCI: Chronic systolic heart failure with ejection fraction 35% Troponinemia/Type 2 MI in the setting of demand ischemia, HTN emergency and left PCA Stroke:  Echo with worsening ejection fraction 35%, generalized wall motion abnormality. Cardiology was on board Not a candidate for GDMT as he is on comfort care now.   Hypertension:  - on comfort care now    Hyperlipidemia: - on comfort care now   Seizure disorder: -Continue IV Keppra  250 twice daily.  Disposition: Awaiting bed availabilty at [facility] place.       Subjective: Patient is obtunded. On comfort care now  Physical Exam: Vitals:   08/29/23 1957 08/30/23 0744 08/30/23 2015 08/31/23 0742  BP: (!) 120/93 125/88 (!) 151/83 128/79  Pulse: 88 85 (!) 116 98  Resp: 19 16 17 16   Temp: 99.2 F (37.3 C) (!) 97.5 F (36.4 C) 99.6 F (37.6 C) 98.4 F (36.9 C)  TempSrc: Axillary Oral Oral Oral  SpO2: 95% 100% 99% 99%  Weight:      Height:       Constitutional: Obtunded,comatose Eyes: PERRL, lids and conjunctivae normal Neck: normal, supple, no masses, no thyromegaly Respiratory: Slow shallow breathing Cardiovascular: Regular rate and rhythm, no murmurs / rubs / gallops. No extremity edema. 2+ pedal pulses. No carotid bruits.  Abdomen: no masses palpated. No hepatosplenomegaly. Bowel sounds positive.  Musculoskeletal: Right arm is flexed, right wrist in flexed and adducted. Neurologic: Unable to examine motor and sensory systems secondary to altered mentation. Psychiatric: Lethargic, unable to effectively unable to assess   Data Reviewed:  There are no new results to review at this time.  Family Communication: None at the bedside  Disposition: Status is: Inpatient Remains inpatient appropriate because: Altered mentation  Planned Discharge Destination: hospice facility    Time spent: 35 minutes  Author: Deliliah Room, MD 08/31/2023 9:23 AM  For on call review [email or web address].

## 2023-08-31 NOTE — Progress Notes (Signed)
 Daily Progress Note   Patient Name: Henry Simmons       Date: 08/31/2023 DOB: [birth year]  Age: 53 y.o. MRN#: [id number] Attending Physician: Dino Antu, MD Primary Care Physician: Petrina Pries, NP Admit Date: 08/25/2023  Reason for Consultation/Follow-up: Establishing goals of care  Length of Stay: 6  Current Medications: Scheduled Meds:   levETIRAcetam   250 mg Intravenous Q12H   mouth rinse  15 mL Mouth Rinse 4 times per day   pantoprazole  (PROTONIX ) IV  40 mg Intravenous Daily    Continuous Infusions:   PRN Meds: acetaminophen  **OR** acetaminophen , artificial tears, diazepam , glycopyrrolate  **OR** glycopyrrolate  **OR** glycopyrrolate , haloperidol  **OR** haloperidol  **OR** haloperidol  lactate, morphine  injection, ondansetron  **OR** ondansetron  (ZOFRAN ) IV, mouth rinse  Physical Exam Constitutional:      General: He is sleeping. He is not in acute distress.    Appearance: He is ill-appearing.  Pulmonary:     Effort: Pulmonary effort is normal.  Skin:    General: Skin is warm and dry.             Vital Signs: BP 128/79 (BP Location: Right Arm)   Pulse 98   Temp 98.4 F (36.9 C) (Oral)   Resp 16   Ht 6' 2 (1.88 m)   Wt 61.4 kg   SpO2 99%   BMI 17.38 kg/m  SpO2: SpO2: 99 % O2 Device: O2 Device: Room Air O2 Flow Rate:         Palliative Assessment/Data: 10%      Patient Active Problem List   Diagnosis Date Noted   Malnutrition of moderate degree 08/28/2023   CVA (cerebral vascular accident) (HCC) 08/25/2023   Primary hypertension 07/17/2023   Neurologic deficit due to old ischemic stroke 04/06/2023   Atrioventricular block, complete (HCC) 01/13/2023   Stenosis of artery (HCC) 01/13/2023   Stage 3a chronic kidney disease (HCC) 01/13/2023   Seizure  (HCC) 01/13/2023   Encounter for general adult medical examination w/o abnormal findings 01/13/2023   Screening for colon cancer 01/13/2023   Screening for prostate cancer 01/13/2023   Herpes zoster vaccination declined 01/13/2023   Hypertensive nephropathy 01/13/2023   Status epilepticus (HCC) 12/10/2022   Lactic acidosis 12/10/2022   Thrombocytopenia (HCC) 12/10/2022   CAD (coronary artery disease) 12/10/2022   Chronic heart failure (HCC) 12/10/2022   Tobacco dependence  10/06/2022   Aphasia as late effect of stroke 10/06/2022   Need for Tdap vaccination 10/06/2022   Need for hepatitis C screening test 10/06/2022   Acute ischemic left MCA stroke (HCC) 06/14/2021   Right hemiplegia (HCC) 06/14/2021   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   Cerebral embolism with cerebral infarction 06/09/2021   NSTEMI (non-ST elevated myocardial infarction) (HCC) 06/07/2021   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   Heart block AV second degree    Ischemic cardiomyopathy 12/01/2020   Non-ST elevation (NSTEMI) myocardial infarction (HCC) 11/29/2020   Cerebral infarction (HCC) 10/24/2012   Dyslipidemia, goal LDL below 100 10/24/2012   Essential hypertension, malignant 09/15/2012   Right sided weakness 09/15/2012   History of CVA (cerebrovascular accident) without residual deficits 09/15/2012   Acute left ACA ischemic stroke (HCC) 09/15/2012   Tobacco use 09/15/2012   Benign hypertension with CKD (chronic kidney disease) stage III (HCC) 10/26/2010    Palliative Care Assessment & Plan   Patient Profile: 53 y.o. 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 Workup revealed new L PCA stroke with L PCA occlusion. His mental status has remained poor, partially opens eyes to noxious stimuli, nonverbal. Not following  commands. Palliative medicine consulted for GOC.   Today's Discussion: Reviewed chart. Patient has not required any prn medications in the last 24 hours. He has been accepted to [facility]. Awaiting bed availability. Patient sleeping in NAD. He looks comfortable. No family at bedside.  08:35 am: Updated patient's wife [facility] by phone. She will be at the hospital later today. Encouraged her to call PMT with questions or concerns.  Recommendations/Plan: DNR/DNI Full comfort care Accepted to [facility]- awaiting available bed Continued PMT support   Code Status:    Code Status Orders  (From admission, onward)           Start     Ordered   08/29/23 1210  Do not attempt resuscitation (DNR) - Comfort care  Continuous       Question Answer Comment  If patient has no pulse and is not breathing Do Not Attempt Resuscitation   In Pre-Arrest Conditions (Patient Is Breathing and Has a Pulse) Provide comfort measures. Relieve any mechanical airway obstruction. Avoid transfer unless required for comfort.   Consent: Discussion documented in EHR or advanced directives reviewed      08/29/23 1210         Extensive chart review has been completed prior to seeing the patient including vital signs, progress/consult notes, orders, medications, and available advance directive documents.  Care plan was discussed with bedside RN  Time spent: 25 minutes  Thank you for allowing the Palliative Medicine Team to assist in the care of this patient.    Stephane CHRISTELLA Palin, NP  Please contact Palliative Medicine Team phone at [phone] for questions and concerns.

## 2023-09-01 DIAGNOSIS — I639 Cerebral infarction, unspecified: Secondary | ICD-10-CM | POA: Diagnosis not present

## 2023-09-01 DIAGNOSIS — Z515 Encounter for palliative care: Secondary | ICD-10-CM

## 2023-09-01 NOTE — TOC Progression Note (Signed)
 Transition of Care [facility]) - Progression Note    Patient Details  Name: Henry Simmons MRN: [id number] Date of Birth: 06-Aug-1970  Transition of Care [facility]) CM/SW Contact  Gwenn Julien Norris, [name] Phone Number: 09/01/2023, 10:35 AM  Clinical Narrative:  Per Nat with [facility], no bed availability at [facility] today. SW will continue to follow and provide updates as available.   Julien Gwenn, MSW, LCSW [phone] (coverage)       Expected Discharge Plan: Hospice Medical Facility Barriers to Discharge: Continued Medical Work up  Expected Discharge Plan and Services In-house Referral: Clinical Social Work Discharge Planning Services: CM Consult Post Acute Care Choice: Skilled Nursing Facility Living arrangements for the past 2 months: Single Family Home                                       Social Determinants of Health (SDOH) Interventions SDOH Screenings   Food Insecurity: No Food Insecurity (08/26/2023)  Housing: Unknown (08/26/2023)  Transportation Needs: No Transportation Needs (08/26/2023)  Utilities: Not At Risk (08/26/2023)  Depression (PHQ2-9): Low Risk  (07/05/2023)  Tobacco Use: High Risk (08/26/2023)    Readmission Risk Interventions     No data to display

## 2023-09-01 NOTE — Progress Notes (Signed)
   PROGRESS NOTE    Henry Simmons [facility]  FMW:[id number]  DOB: [birth year]  DOA: 08/25/2023 PCP: Petrina Pries, NP Outpatient Specialists:   Hospital course:  53 year old gentleman with CAD, HFrEF and history of left MCA stroke with right hemiparesis was admitted with a large new left PCA stroke with left PCA occlusion.  Patient was transition to comfort care, remains nonverbal, not following commands.   Subjective:  Patient is awake with nonpurposeful movement, does not respond to voice or command.  Wife at bedside.   Objective: Vitals:   08/30/23 0744 08/30/23 2015 08/31/23 0742 08/31/23 2000  BP: 125/88 (!) 151/83 128/79 108/75  Pulse: 85 (!) 116 98 88  Resp: 16 17 16 18   Temp: (!) 97.5 F (36.4 C) 99.6 F (37.6 C) 98.4 F (36.9 C) 98.1 F (36.7 C)  TempSrc: Oral Oral Oral Axillary  SpO2: 100% 99% 99% 98%  Weight:      Height:       No intake or output data in the 24 hours ending 09/01/23 1612 Filed Weights   08/29/23 0531  Weight: 61.4 kg     Exam:  General: Patient lying in bed with eyes open, wandering with nonpurposeful movements of his left arm.  Does not respond to voice or command.  Data Reviewed:  Basic Metabolic Panel: Recent Labs  Lab 08/26/23 0621 08/28/23 0458 08/29/23 0721  NA 143  --   --   K 3.9  --   --   CL 109  --   --   CO2 18*  --   --   GLUCOSE 104*  --   --   BUN 14  --   --   CREATININE 1.11  --   --   CALCIUM  9.5  --   --   MG  --  2.0 2.0  PHOS  --  2.2* 3.0    CBC: No results for input(s): WBC, NEUTROABS, HGB, HCT, MCV, PLT in the last 168 hours.   Scheduled Meds:  levETIRAcetam   250 mg Intravenous Q12H   mouth rinse  15 mL Mouth Rinse 4 times per day   pantoprazole  (PROTONIX ) IV  40 mg Intravenous Daily   Continuous Infusions:   Assessment & Plan:   Acute large left PCA stroke Movements are perhaps suggestive of PVS Patient is on comfort care Appreciate ongoing palliative care involvement Continue  present management   DVT prophylaxis: N/A Code Status: DNR Family Communication: Wife was bedside throughout     Studies: No results found.  Principal Problem:   CVA (cerebral vascular accident) [facility]) Active Problems:   Malnutrition of moderate degree     Agustus Mane Tublu Sharra Cayabyab, [facility]  If 7PM-7AM, please contact night-coverage [email or web address]   LOS: 7 days

## 2023-09-01 NOTE — Progress Notes (Signed)
 Daily Progress Note   Patient Name: Henry Simmons       Date: 09/01/2023 DOB: 27-Apr-1970  Age: 53 y.o. MRN#: [id number] Attending Physician: Dana Ivan Masse* Primary Care Physician: Petrina Pries, NP Admit Date: 08/25/2023  Reason for Consultation/Follow-up: Establishing goals of care  Length of Stay: 7  Current Medications: Scheduled Meds:   levETIRAcetam   250 mg Intravenous Q12H   mouth rinse  15 mL Mouth Rinse 4 times per day   pantoprazole  (PROTONIX ) IV  40 mg Intravenous Daily    Continuous Infusions:   PRN Meds: acetaminophen  **OR** acetaminophen , artificial tears, diazepam , glycopyrrolate  **OR** glycopyrrolate  **OR** glycopyrrolate , haloperidol  **OR** haloperidol  **OR** haloperidol  lactate, morphine  injection, ondansetron  **OR** ondansetron  (ZOFRAN ) IV, mouth rinse  Physical Exam Constitutional:      General: He is sleeping. He is not in acute distress.    Appearance: He is ill-appearing.  Pulmonary:     Effort: Pulmonary effort is normal.  Skin:    General: Skin is warm and dry.             Vital Signs: BP 108/75 (BP Location: Right Arm)   Pulse 88   Temp 98.1 F (36.7 C) (Axillary)   Resp 18   Ht 6' 2 (1.88 m)   Wt 61.4 kg   SpO2 98%   BMI 17.38 kg/m  SpO2: SpO2: 98 % O2 Device: O2 Device: Room Air O2 Flow Rate:         Palliative Assessment/Data: 10%      Patient Active Problem List   Diagnosis Date Noted   Malnutrition of moderate degree 08/28/2023   CVA (cerebral vascular accident) (HCC) 08/25/2023   Primary hypertension 07/17/2023   Neurologic deficit due to old ischemic stroke 04/06/2023   Atrioventricular block, complete (HCC) 01/13/2023   Stenosis of artery (HCC) 01/13/2023   Stage 3a chronic kidney disease (HCC) 01/13/2023    Seizure (HCC) 01/13/2023   Encounter for general adult medical examination w/o abnormal findings 01/13/2023   Screening for colon cancer 01/13/2023   Screening for prostate cancer 01/13/2023   Herpes zoster vaccination declined 01/13/2023   Hypertensive nephropathy 01/13/2023   Status epilepticus (HCC) 12/10/2022   Lactic acidosis 12/10/2022   Thrombocytopenia (HCC) 12/10/2022   CAD (coronary artery disease) 12/10/2022   Chronic heart failure (HCC) 12/10/2022   Tobacco dependence  10/06/2022   Aphasia as late effect of stroke 10/06/2022   Need for Tdap vaccination 10/06/2022   Need for hepatitis C screening test 10/06/2022   Acute ischemic left MCA stroke (HCC) 06/14/2021   Right hemiplegia (HCC) 06/14/2021   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   Cerebral embolism with cerebral infarction 06/09/2021   NSTEMI (non-ST elevated myocardial infarction) (HCC) 06/07/2021   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   Heart block AV second degree    Ischemic cardiomyopathy 12/01/2020   Non-ST elevation (NSTEMI) myocardial infarction (HCC) 11/29/2020   Cerebral infarction (HCC) 10/24/2012   Dyslipidemia, goal LDL below 100 10/24/2012   Essential hypertension, malignant 09/15/2012   Right sided weakness 09/15/2012   History of CVA (cerebrovascular accident) without residual deficits 09/15/2012   Acute left ACA ischemic stroke (HCC) 09/15/2012   Tobacco use 09/15/2012   Benign hypertension with CKD (chronic kidney disease) stage III (HCC) 10/26/2010    Palliative Care Assessment & Plan   Patient Profile: 53 y.o. 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 Workup revealed new L PCA stroke with L PCA occlusion. His mental status has remained poor, partially opens eyes to noxious stimuli, nonverbal. Not following  commands. Palliative medicine consulted for GOC.   Today's Discussion: Reviewed chart. Patient has not required any prn medications in the last 24 hours. He has been accepted to [facility]. Awaiting bed availability. Patient sleeping in NAD. He looks comfortable. No family at bedside.   Recommendations/Plan: DNR/DNI Full comfort care Accepted to [facility]- awaiting available bed Continued PMT support   Code Status:    Code Status Orders  (From admission, onward)           Start     Ordered   08/29/23 1210  Do not attempt resuscitation (DNR) - Comfort care  Continuous       Question Answer Comment  If patient has no pulse and is not breathing Do Not Attempt Resuscitation   In Pre-Arrest Conditions (Patient Is Breathing and Has a Pulse) Provide comfort measures. Relieve any mechanical airway obstruction. Avoid transfer unless required for comfort.   Consent: Discussion documented in EHR or advanced directives reviewed      08/29/23 1210         Extensive chart review has been completed prior to seeing the patient including vital signs, progress/consult notes, orders, medications, and available advance directive documents.  Care plan was discussed with bedside RN  Time spent: 25 minutes  Thank you for allowing the Palliative Medicine Team to assist in the care of this patient.    Stephane CHRISTELLA Palin, NP  Please contact Palliative Medicine Team phone at [phone] for questions and concerns.

## 2023-09-02 DIAGNOSIS — I639 Cerebral infarction, unspecified: Secondary | ICD-10-CM | POA: Diagnosis not present

## 2023-09-02 DIAGNOSIS — Z515 Encounter for palliative care: Secondary | ICD-10-CM | POA: Diagnosis not present

## 2023-09-02 NOTE — Progress Notes (Signed)
 Daily Progress Note   Patient Name: Henry Simmons       Date: 09/02/2023 DOB: [birth year]  Age: 53 y.o. MRN#: [id number] Attending Physician: Dino Antu, MD Primary Care Physician: Petrina Pries, NP Admit Date: 08/25/2023  Reason for Consultation/Follow-up: Establishing goals of care  Length of Stay: 8  Current Medications: Scheduled Meds:   levETIRAcetam   250 mg Intravenous Q12H   mouth rinse  15 mL Mouth Rinse 4 times per day   pantoprazole  (PROTONIX ) IV  40 mg Intravenous Daily    Continuous Infusions:   PRN Meds: acetaminophen  **OR** acetaminophen , artificial tears, diazepam , glycopyrrolate  **OR** glycopyrrolate  **OR** glycopyrrolate , haloperidol  **OR** haloperidol  **OR** haloperidol  lactate, morphine  injection, ondansetron  **OR** ondansetron  (ZOFRAN ) IV, mouth rinse  Physical Exam Constitutional:      General: He is sleeping. He is not in acute distress.    Appearance: He is ill-appearing.  Pulmonary:     Comments: Period of apnea when sleeping Skin:    General: Skin is warm and dry.             Vital Signs: BP (!) 126/91 (BP Location: Left Arm)   Pulse 88   Temp 98 F (36.7 C) (Oral)   Resp 12   Ht 6' 2 (1.88 m)   Wt 61.4 kg   SpO2 99%   BMI 17.38 kg/m  SpO2: SpO2: 99 % O2 Device: O2 Device: Room Air O2 Flow Rate:         Palliative Assessment/Data: 10%      Patient Active Problem List   Diagnosis Date Noted   Malnutrition of moderate degree 08/28/2023   CVA (cerebral vascular accident) (HCC) 08/25/2023   Primary hypertension 07/17/2023   Neurologic deficit due to old ischemic stroke 04/06/2023   Atrioventricular block, complete (HCC) 01/13/2023   Stenosis of artery (HCC) 01/13/2023   Stage 3a chronic kidney disease (HCC) 01/13/2023   Seizure  (HCC) 01/13/2023   Encounter for general adult medical examination w/o abnormal findings 01/13/2023   Screening for colon cancer 01/13/2023   Screening for prostate cancer 01/13/2023   Herpes zoster vaccination declined 01/13/2023   Hypertensive nephropathy 01/13/2023   Status epilepticus (HCC) 12/10/2022   Lactic acidosis 12/10/2022   Thrombocytopenia (HCC) 12/10/2022   CAD (coronary artery disease) 12/10/2022   Chronic heart failure (HCC) 12/10/2022   Tobacco  dependence 10/06/2022   Aphasia as late effect of stroke 10/06/2022   Need for Tdap vaccination 10/06/2022   Need for hepatitis C screening test 10/06/2022   Acute ischemic left MCA stroke (HCC) 06/14/2021   Right hemiplegia (HCC) 06/14/2021   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   Cerebral embolism with cerebral infarction 06/09/2021   NSTEMI (non-ST elevated myocardial infarction) (HCC) 06/07/2021   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   Heart block AV second degree    Ischemic cardiomyopathy 12/01/2020   Non-ST elevation (NSTEMI) myocardial infarction (HCC) 11/29/2020   Cerebral infarction (HCC) 10/24/2012   Dyslipidemia, goal LDL below 100 10/24/2012   Essential hypertension, malignant 09/15/2012   Right sided weakness 09/15/2012   History of CVA (cerebrovascular accident) without residual deficits 09/15/2012   Acute left ACA ischemic stroke (HCC) 09/15/2012   Tobacco use 09/15/2012   Benign hypertension with CKD (chronic kidney disease) stage III (HCC) 10/26/2010    Palliative Care Assessment & Plan   Patient Profile: 53 y.o. 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 Workup revealed new L PCA stroke with L PCA occlusion. His mental status has remained poor, partially opens eyes to noxious stimuli, nonverbal. Not following  commands. Palliative medicine consulted for GOC.   Today's Discussion: Reviewed chart. Patient required one dose of morphine  and one dose of zofran  over the last 24 hours. He has been accepted to [facility]. Awaiting bed availability. Patient sleeping in NAD. He looks comfortable. His breathing is unlabored with a period of apnea. No family at bedside.   Recommendations/Plan: DNR/DNI Full comfort care Accepted to [facility]- awaiting available bed Continued PMT support   Code Status:    Code Status Orders  (From admission, onward)           Start     Ordered   08/29/23 1210  Do not attempt resuscitation (DNR) - Comfort care  Continuous       Question Answer Comment  If patient has no pulse and is not breathing Do Not Attempt Resuscitation   In Pre-Arrest Conditions (Patient Is Breathing and Has a Pulse) Provide comfort measures. Relieve any mechanical airway obstruction. Avoid transfer unless required for comfort.   Consent: Discussion documented in EHR or advanced directives reviewed      08/29/23 1210         Extensive chart review has been completed prior to seeing the patient including vital signs, progress/consult notes, orders, medications, and available advance directive documents.  Care plan was discussed with bedside RN  Time spent: 25 minutes  Thank you for allowing the Palliative Medicine Team to assist in the care of this patient.    Stephane CHRISTELLA Palin, NP  Please contact Palliative Medicine Team phone at [phone] for questions and concerns.

## 2023-09-02 NOTE — Progress Notes (Signed)
 [facility] [phone] [facility] Liaison Note  Unfortunately, [facility] is unable to offer a bed today. Spoke with patient wife via phone to see if they were interested in [facility] in [place]. Per wife she will continue to wait for bed to open at [facility]. TOC aware that liaison will follow up tomorrow or sooner if a bed becomes available.    Thank you for allowing us  to participate in this patient's care.   Nat Babe, BSN, RN [employer] [phone]

## 2023-09-02 NOTE — Progress Notes (Signed)
  Progress Note   Patient: Henry Simmons FMW:[id number] DOB: 11-Apr-1970 DOA: 08/25/2023     8 DOS: the patient was seen and examined on 09/02/2023   Brief hospital course: 53 year old with past medical history significant for CAD status post PCI, heart failure with moderate reduced ejection fraction, hypertension, hyperlipidemia, seizure disorder, history of left MCA stroke with right hemiparesis at baseline and moderate aphasia present with new left PCA stroke.  Transitioned to comfort care as of 7/16.  Assessment and Plan:  On Comfort care. Palliative care on board. On comfort meds.  Acute Left PCA stroke Prior left MCA territory stroke:  - CT head without contrast acute nonhemorrhagic left PCA territory infarct with hyperdense left posterior cerebral artery consistent with vessel occlusion.  Chronic encephalomalacia of the left MCA territory and right parietal lobe.  - CT angio head and neck: Occlusion of the left posterior cerebral artery at its origin.  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  Moderate to severe stenosis A1 A2, moderate stenosis M2  2D echo ejection fraction 30 to 35%, left posterior basal unusual thickening with definitive aberrant papillary muscle and core, wall motion abnormality -LDL 68, A1c 5.4  -DCed aspirin  and statin as he is on comfort care now. - s/p cortrack feeding tube (left nostril) placement on 7/14 with subsequent removal on 7/16.  -Palliative care consulted- DNR/DNI status, transitioned to comfort care as of 7/16.   CAD status post PCI: Chronic systolic heart failure with ejection fraction 35% Troponinemia/Type 2 MI in the setting of demand ischemia, HTN emergency and left PCA Stroke:  Echo with worsening ejection fraction 35%, generalized wall motion abnormality. Cardiology was on board Not a candidate for GDMT as he is on comfort care now.   Hypertension:  - on comfort care now    Hyperlipidemia: - on comfort care now   Seizure disorder: -Continue IV Keppra  250 twice daily.  Disposition: Awaiting bed availabilty at [facility].       Subjective: Patient is obtunded. On comfort care now. He doesn't appear to be in distress.  Physical Exam: Vitals:   08/31/23 0742 08/31/23 2000 09/01/23 1957 09/02/23 0759  BP: 128/79 108/75 (!) 156/109 (!) 126/91  Pulse: 98 88 (!) 105 88  Resp: 16 18 18 12   Temp: 98.4 F (36.9 C) 98.1 F (36.7 C) 98.8 F (37.1 C) 98 F (36.7 C)  TempSrc: Oral Axillary Axillary Oral  SpO2: 99% 98% 99% 99%  Weight:      Height:       Constitutional: Obtunded,comatose Eyes: PERRL, lids and conjunctivae normal Neck: normal, supple, no masses, no thyromegaly Respiratory: Slow shallow breathing Cardiovascular: Regular rate and rhythm, no murmurs / rubs / gallops. No extremity edema. 2+ pedal pulses. No carotid bruits.  Abdomen: no masses palpated. No hepatosplenomegaly. Bowel sounds positive.  Musculoskeletal: Right arm is flexed, right wrist in flexed and adducted. Neurologic: Unable to examine motor and sensory systems secondary to altered mentation. Psychiatric: Lethargic, unable to effectively unable to assess   Data Reviewed:  There are no new results to review at this time.  Family Communication: None at the bedside  Disposition: Status is: Inpatient Remains inpatient appropriate because: Altered mentation  Planned Discharge Destination: hospice facility    Time spent: 32 minutes  Author: Deliliah Room, MD 09/02/2023 9:10 AM  For on call review [email or web address].

## 2023-09-03 DIAGNOSIS — Z7189 Other specified counseling: Secondary | ICD-10-CM | POA: Diagnosis not present

## 2023-09-03 DIAGNOSIS — Z515 Encounter for palliative care: Secondary | ICD-10-CM | POA: Diagnosis not present

## 2023-09-03 DIAGNOSIS — I639 Cerebral infarction, unspecified: Secondary | ICD-10-CM | POA: Diagnosis not present

## 2023-09-03 NOTE — TOC Progression Note (Signed)
 Transition of Care [facility]) - Progression Note    Patient Details  Name: Henry Simmons MRN: [id number] Date of Birth: May 08, 1970  Transition of Care [facility] CM/SW Contact  [name], [name] Phone Number: 09/03/2023, 12:36 PM  Clinical Narrative:   CSW updated by [facility] that no bed is available today. CSW to follow.    Expected Discharge Plan: Hospice Medical Facility Barriers to Discharge: Continued Medical Work up  Expected Discharge Plan and Services In-house Referral: Clinical Social Work Discharge Planning Services: CM Consult Post Acute Care Choice: Skilled Nursing Facility Living arrangements for the past 2 months: Single Family Home                                       Social Determinants of Health (SDOH) Interventions SDOH Screenings   Food Insecurity: No Food Insecurity (08/26/2023)  Housing: Unknown (08/26/2023)  Transportation Needs: No Transportation Needs (08/26/2023)  Utilities: Not At Risk (08/26/2023)  Depression (PHQ2-9): Low Risk  (07/05/2023)  Tobacco Use: High Risk (08/26/2023)    Readmission Risk Interventions     No data to display

## 2023-09-03 NOTE — Plan of Care (Signed)
 Comfort measures continued.  Awaiting bed availability at [facility].  Family at bedside intermittently.   Problem: Education: Goal: Knowledge of General Education information will improve Description: Including pain rating scale, medication(s)/side effects and non-pharmacologic comfort measures 09/03/2023 1758 by Franchot Porter ORN, RN Outcome: Progressing 09/03/2023 1757 by Franchot Porter ORN, RN Outcome: Progressing 09/03/2023 1757 by Franchot Porter ORN, RN Reactivated   Problem: Health Behavior/Discharge Planning: Goal: Ability to manage health-related needs will improve 09/03/2023 1758 by Franchot Porter ORN, RN Outcome: Progressing 09/03/2023 1757 by Franchot Porter ORN, RN Outcome: Progressing 09/03/2023 1757 by Franchot Porter ORN, RN Reactivated   Problem: Clinical Measurements: Goal: Ability to maintain clinical measurements within normal limits will improve 09/03/2023 1758 by Franchot Porter ORN, RN Outcome: Progressing 09/03/2023 1757 by Franchot Porter ORN, RN Outcome: Progressing 09/03/2023 1757 by Franchot Porter ORN, RN Reactivated Goal: Will remain free from infection 09/03/2023 1758 by Franchot Porter ORN, RN Outcome: Progressing 09/03/2023 1757 by Franchot Porter ORN, RN Outcome: Progressing 09/03/2023 1757 by Franchot Porter ORN, RN Reactivated Goal: Diagnostic test results will improve 09/03/2023 1758 by Franchot Porter ORN, RN Outcome: Progressing 09/03/2023 1757 by Franchot Porter ORN, RN Outcome: Progressing 09/03/2023 1757 by Franchot Porter ORN, RN Reactivated Goal: Respiratory complications will improve 09/03/2023 1757 by Franchot Porter ORN, RN Outcome: Progressing 09/03/2023 1757 by Franchot Porter ORN, RN Reactivated

## 2023-09-03 NOTE — Progress Notes (Signed)
 [facility] [phone] [facility] Liaison Note   Unfortunately, [facility] is unable to offer a bed today. Spoke with patient wife via phone to see if they were interested in [facility] in [place]. Per wife she will continue to wait for bed to open at [facility] TOC aware that liaison will follow up tomorrow or sooner if a bed becomes available.    Thank you for allowing us  to participate in this patient's care.   Eleanor Nail, LPN [facility] Liaison [phone]

## 2023-09-03 NOTE — Progress Notes (Signed)
 Spoke with patients mother and father, Henry Simmons and Henry Simmons at bedside.  Mother/ Henry relayed that she was very upset that the small bore feeding tube was removed and that now patient was comfort measures. She states his wife is just trying to get rid of him. She states that after GOC conversation on 7/16 that she did not realize wife would make the transition to Palliative Care. Spoke with Dr. Jacelyn and Stephane Palin, NP with Palliative via secure chat to clarify that wife/ [name] is healthcare decision maker and that the decision stands.  Explained above to mother and father at bedside.  They were both visibly upset, but verbalized understanding.

## 2023-09-03 NOTE — Progress Notes (Signed)
 Daily Progress Note   Patient Name: Henry Simmons       Date: 09/03/2023 DOB: [birth year]  Age: 53 y.o. MRN#: [id number] Attending Physician: Dino Antu, MD Primary Care Physician: Petrina Pries, NP Admit Date: 08/25/2023  Reason for Consultation/Follow-up: Establishing goals of care  Length of Stay: 9  Current Medications: Scheduled Meds:  . levETIRAcetam   250 mg Intravenous Q12H  . mouth rinse  15 mL Mouth Rinse 4 times per day  . pantoprazole  (PROTONIX ) IV  40 mg Intravenous Daily    Continuous Infusions:   PRN Meds: acetaminophen  **OR** acetaminophen , artificial tears, diazepam , glycopyrrolate  **OR** glycopyrrolate  **OR** glycopyrrolate , haloperidol  **OR** haloperidol  **OR** haloperidol  lactate, morphine  injection, ondansetron  **OR** ondansetron  (ZOFRAN ) IV, mouth rinse  Physical Exam Constitutional:      General: He is sleeping. He is not in acute distress.    Appearance: He is ill-appearing.  Pulmonary:     Comments: unlabored Skin:    General: Skin is warm and dry.             Vital Signs: BP (!) 143/107 (BP Location: Left Arm)   Pulse (!) 111   Temp 98.4 F (36.9 C) (Oral)   Resp 16   Ht 6' 2 (1.88 m)   Wt 61.4 kg   SpO2 97%   BMI 17.38 kg/m  SpO2: SpO2: 97 % O2 Device: O2 Device: Room Air O2 Flow Rate:         Palliative Assessment/Data: 10%      Patient Active Problem List   Diagnosis Date Noted  . Malnutrition of moderate degree 08/28/2023  . CVA (cerebral vascular accident) (HCC) 08/25/2023  . Primary hypertension 07/17/2023  . Neurologic deficit due to old ischemic stroke 04/06/2023  . Atrioventricular block, complete (HCC) 01/13/2023  . Stenosis of artery (HCC) 01/13/2023  . Stage 3a chronic kidney disease (HCC) 01/13/2023  . Seizure  (HCC) 01/13/2023  . Encounter for general adult medical examination w/o abnormal findings 01/13/2023  . Screening for colon cancer 01/13/2023  . Screening for prostate cancer 01/13/2023  . Herpes zoster vaccination declined 01/13/2023  . Hypertensive nephropathy 01/13/2023  . Status epilepticus (HCC) 12/10/2022  . Lactic acidosis 12/10/2022  . Thrombocytopenia (HCC) 12/10/2022  . CAD (coronary artery disease) 12/10/2022  . Chronic heart failure (HCC) 12/10/2022  . Tobacco dependence 10/06/2022  .  Aphasia as late effect of stroke 10/06/2022  . Need for Tdap vaccination 10/06/2022  . Need for hepatitis C screening test 10/06/2022  . Acute ischemic left MCA stroke (HCC) 06/14/2021  . Right hemiplegia (HCC) 06/14/2021  . 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  . 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  . Cerebral embolism with cerebral infarction 06/09/2021  . NSTEMI (non-ST elevated myocardial infarction) (HCC) 06/07/2021  . 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  . Heart block AV second degree   . Ischemic cardiomyopathy 12/01/2020  . Non-ST elevation (NSTEMI) myocardial infarction (HCC) 11/29/2020  . Cerebral infarction (HCC) 10/24/2012  . Dyslipidemia, goal LDL below 100 10/24/2012  . Essential hypertension, malignant 09/15/2012  . Right sided weakness 09/15/2012  . History of CVA (cerebrovascular accident) without residual deficits 09/15/2012  . Acute left ACA ischemic stroke (HCC) 09/15/2012  . Tobacco use 09/15/2012  . Benign hypertension with CKD (chronic kidney disease) stage III (HCC) 10/26/2010    Palliative Care Assessment & Plan   Patient Profile: 53 y.o. 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 Workup revealed new L PCA stroke with L PCA occlusion. His mental status has remained poor, partially opens eyes to noxious stimuli,  nonverbal. Not following commands. Palliative medicine consulted for GOC.   Today's Discussion: Reviewed chart. Patient only required prn artificial tears over the last 24 hours. He has been accepted to [facility]. Awaiting bed availability. Patient sleeping in NAD. He looks comfortable. His breathing is unlabored. No family at bedside.  0935: Called patient's wife [facility]. [name] is grateful for PMT support. She shared wants to keep waiting for a bed at [facility]. Assured her we could keep Salim comfortable here until a bed is available at [facility].   Recommendations/Plan: DNR/DNI Full comfort care Accepted to [facility]- awaiting available bed Continued PMT support   Code Status:    Code Status Orders  (From admission, onward)           Start     Ordered   08/29/23 1210  Do not attempt resuscitation (DNR) - Comfort care  Continuous       Question Answer Comment  If patient has no pulse and is not breathing Do Not Attempt Resuscitation   In Pre-Arrest Conditions (Patient Is Breathing and Has a Pulse) Provide comfort measures. Relieve any mechanical airway obstruction. Avoid transfer unless required for comfort.   Consent: Discussion documented in EHR or advanced directives reviewed      08/29/23 1210         Extensive chart review has been completed prior to seeing the patient including progress/consult notes, orders, medications, and available advance directive documents.   Time spent: 25 minutes  Thank you for allowing the Palliative Medicine Team to assist in the care of this patient.    Stephane CHRISTELLA Palin, NP  Please contact Palliative Medicine Team phone at [phone] for questions and concerns.

## 2023-09-03 NOTE — Progress Notes (Signed)
  Progress Note   Patient: Henry Simmons FMW:[id number] DOB: [birth year] DOA: 08/25/2023     9 DOS: the patient was seen and examined on 09/03/2023   Brief hospital course: 53 year old with past medical history significant for CAD status post PCI, heart failure with moderate reduced ejection fraction, hypertension, hyperlipidemia, seizure disorder, history of left MCA stroke with right hemiparesis at baseline and moderate aphasia present with new left PCA stroke.  Transitioned to comfort care as of 7/16.  Assessment and Plan:  On Comfort care. Palliative care on board. On comfort meds.  Acute Left PCA stroke Prior left MCA territory stroke:  - CT head without contrast acute nonhemorrhagic left PCA territory infarct with hyperdense left posterior cerebral artery consistent with vessel occlusion.  Chronic encephalomalacia of the left MCA territory and right parietal lobe.  - CT angio head and neck: Occlusion of the left posterior cerebral artery at its origin.  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  Moderate to severe stenosis A1 A2, moderate stenosis M2  2D echo ejection fraction 30 to 35%, left posterior basal unusual thickening with definitive aberrant papillary muscle and core, wall motion abnormality -LDL 68, A1c 5.4  -DCed aspirin  and statin as he is on comfort care now. - s/p cortrack feeding tube (left nostril) placement on 7/14 with subsequent removal on 7/16.  -Palliative care consulted- DNR/DNI status, transitioned to comfort care as of 7/16.   CAD status post PCI: Chronic systolic heart failure with ejection fraction 35% Troponinemia/Type 2 MI in the setting of demand ischemia, HTN emergency and left PCA Stroke:  Echo with worsening ejection fraction 35%, generalized wall motion abnormality. Cardiology was on board Not a candidate for GDMT as he is on comfort care now.   Hypertension:  - on comfort care now    Hyperlipidemia: - on comfort care now   Seizure disorder: -Continue IV Keppra  250 twice daily.  Disposition: Awaiting bed availabilty at [facility] place.       Subjective: Patient is obtunded. On comfort care now. He doesn't appear to be in distress.  Physical Exam: Vitals:   09/01/23 1957 09/02/23 0759 09/02/23 1932 09/03/23 0743  BP: (!) 156/109 (!) 126/91 (!) 146/106 (!) 143/107  Pulse: (!) 105 88 (!) 109 (!) 111  Resp: 18 12 14 16   Temp: 98.8 F (37.1 C) 98 F (36.7 C) 99.1 F (37.3 C) 98.4 F (36.9 C)  TempSrc: Axillary Oral Oral Oral  SpO2: 99% 99% 99% 97%  Weight:      Height:       Constitutional: Obtunded,comatose Eyes: no obvious abnormalities Neck: normal, supple, no masses, no thyromegaly Respiratory: Slow shallow breathing Cardiovascular: Regular rate and rhythm, no murmurs / rubs / gallops. No extremity edema. 2+ pedal pulses. No carotid bruits.  Abdomen: no masses palpated. No hepatosplenomegaly. Bowel sounds positive.  Musculoskeletal: Right arm is flexed, right wrist in flexed and adducted. Neurologic: Unable to examine motor and sensory systems secondary to altered mentation. Psychiatric: Lethargic, unable to effectively unable to assess   Data Reviewed:  There are no new results to review at this time.  Family Communication: None at the bedside  Disposition: Status is: Inpatient Remains inpatient appropriate because: Altered mentation  Planned Discharge Destination: hospice facility    Time spent: 30 minutes  Author: Deliliah Room, MD 09/03/2023 9:13 AM  For on call review [email or web address].

## 2023-09-04 DIAGNOSIS — E440 Moderate protein-calorie malnutrition: Secondary | ICD-10-CM

## 2023-09-04 DIAGNOSIS — Z515 Encounter for palliative care: Secondary | ICD-10-CM | POA: Diagnosis not present

## 2023-09-04 DIAGNOSIS — I63532 Cerebral infarction due to unspecified occlusion or stenosis of left posterior cerebral artery: Secondary | ICD-10-CM | POA: Diagnosis not present

## 2023-09-04 DIAGNOSIS — Z7189 Other specified counseling: Secondary | ICD-10-CM | POA: Diagnosis not present

## 2023-09-04 DIAGNOSIS — I639 Cerebral infarction, unspecified: Secondary | ICD-10-CM | POA: Diagnosis not present

## 2023-09-04 DIAGNOSIS — Z789 Other specified health status: Secondary | ICD-10-CM

## 2023-09-04 DIAGNOSIS — Z66 Do not resuscitate: Secondary | ICD-10-CM
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
## 2023-09-04 NOTE — Progress Notes (Signed)
 [facility] [phone] [facility] Liaison Note   Unfortunately, [facility] is unable to offer a bed today. Spoke with patient wife via phone to see if she was interested in [facility] in [place]. Per wife she will continue to wait for bed to open at [facility]. TOC aware that liaison will follow up tomorrow or sooner if a bed becomes available.    Thank you for allowing us  to participate in this patient's care.   Eleanor Nail, LPN [facility] Liaison [phone]

## 2023-09-04 NOTE — Plan of Care (Signed)
  Problem: Education: Goal: Knowledge of disease or condition will improve Outcome: Progressing   Problem: Ischemic Stroke/TIA Tissue Perfusion: Goal: Complications of ischemic stroke/TIA will be minimized Outcome: Progressing   Problem: Coping: Goal: Will identify appropriate support needs Outcome: Progressing

## 2023-09-04 NOTE — Plan of Care (Signed)
  Problem: Education: Goal: Knowledge of disease or condition will improve Outcome: Progressing Goal: Knowledge of secondary prevention will improve (MUST DOCUMENT ALL) Outcome: Progressing Goal: Knowledge of patient specific risk factors will improve (DELETE if not current risk factor) Outcome: Progressing   Problem: Ischemic Stroke/TIA Tissue Perfusion: Goal: Complications of ischemic stroke/TIA will be minimized Outcome: Progressing   Problem: Coping: Goal: Will verbalize positive feelings about self Outcome: Progressing Goal: Will identify appropriate support needs Outcome: Progressing   Problem: Health Behavior/Discharge Planning: Goal: Ability to manage health-related needs will improve Outcome: Progressing Goal: Goals will be collaboratively established with patient/family Outcome: Progressing   Problem: Self-Care: Goal: Ability to participate in self-care as condition permits will improve Outcome: Progressing Goal: Verbalization of feelings and concerns over difficulty with self-care will improve Outcome: Progressing Goal: Ability to communicate needs accurately will improve Outcome: Progressing   Problem: Education: Goal: Knowledge of General Education information will improve Description: Including pain rating scale, medication(s)/side effects and non-pharmacologic comfort measures Outcome: Progressing   Problem: Health Behavior/Discharge Planning: Goal: Ability to manage health-related needs will improve Outcome: Progressing   Problem: Clinical Measurements: Goal: Ability to maintain clinical measurements within normal limits will improve Outcome: Progressing Goal: Will remain free from infection Outcome: Progressing Goal: Diagnostic test results will improve Outcome: Progressing Goal: Respiratory complications will improve Outcome: Progressing Goal: Cardiovascular complication will be avoided Outcome: Progressing

## 2023-09-04 NOTE — Progress Notes (Signed)
  Progress Note   Patient: Henry Simmons FMW:[id number] DOB: 1971-01-23 DOA: 08/25/2023     10 DOS: the patient was seen and examined on 09/04/2023   Brief hospital course: 53 year old with past medical history significant for CAD status post PCI, heart failure with moderate reduced ejection fraction, hypertension, hyperlipidemia, seizure disorder, history of left MCA stroke with right hemiparesis at baseline and moderate aphasia present with new left PCA stroke.  Transitioned to comfort care as of 7/16.  Assessment and Plan:  On Comfort care. Palliative care on board. On comfort meds.  Acute Left PCA stroke Prior left MCA territory stroke:  - CT head without contrast acute nonhemorrhagic left PCA territory infarct with hyperdense left posterior cerebral artery consistent with vessel occlusion.  Chronic encephalomalacia of the left MCA territory and right parietal lobe.  - CT angio head and neck: Occlusion of the left posterior cerebral artery at its origin.  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  Moderate to severe stenosis A1 A2, moderate stenosis M2  2D echo ejection fraction 30 to 35%, left posterior basal unusual thickening with definitive aberrant papillary muscle and core, wall motion abnormality -LDL 68, A1c 5.4  -DCed aspirin  and statin as he is on comfort care now. - s/p cortrack feeding tube (left nostril) placement on 7/14 with subsequent removal on 7/16.  -Palliative care consulted- DNR/DNI status, transitioned to comfort care as of 7/16.   CAD status post PCI: Chronic systolic heart failure with ejection fraction 35% Troponinemia/Type 2 MI in the setting of demand ischemia, HTN emergency and left PCA Stroke:  Echo with worsening ejection fraction 35%, generalized wall motion abnormality. Cardiology was on board Not a candidate for GDMT as he is on comfort care now.   Hypertension:  - on comfort care now    Hyperlipidemia: - on comfort care now   Seizure disorder: -Continue IV Keppra  250 twice daily.  Disposition: Awaiting bed availabilty at [facility] place.       Subjective: Patient is obtunded. On comfort care now. He doesn't appear to be in distress. Opens eyes spontaneously.  Physical Exam: Vitals:   09/02/23 1932 09/03/23 0743 09/03/23 2006 09/04/23 0805  BP: (!) 146/106 (!) 143/107 (!) 163/65 (!) 162/93  Pulse: (!) 109 (!) 111 (!) 124 (!) 110  Resp: 14 16 16 20   Temp: 99.1 F (37.3 C) 98.4 F (36.9 C) 98.4 F (36.9 C) 98.5 F (36.9 C)  TempSrc: Oral Oral Oral Oral  SpO2: 99% 97% 99% 99%  Weight:      Height:       Constitutional: Obtunded,comatose Eyes: no obvious abnormalities Neck: normal, supple, no masses, no thyromegaly Respiratory: Slow shallow breathing Cardiovascular: Regular rate and rhythm, no murmurs / rubs / gallops. No extremity edema. 2+ pedal pulses. No carotid bruits.  Abdomen: no masses palpated. No hepatosplenomegaly. Bowel sounds positive.  Musculoskeletal: Right arm is flexed, right wrist in flexed and adducted. Neurologic: Unable to examine motor and sensory systems secondary to altered mentation. Psychiatric: Lethargic, unable to effectively unable to assess   Data Reviewed:  There are no new results to review at this time.  Family Communication: None at the bedside  Disposition: Status is: Inpatient Remains inpatient appropriate because: Altered mentation  Planned Discharge Destination: hospice facility    Time spent: 31 minutes  Author: Deliliah Room, MD 09/04/2023 10:45 AM  For on call review [email or web address].

## 2023-09-05 DIAGNOSIS — I639 Cerebral infarction, unspecified: Secondary | ICD-10-CM | POA: Diagnosis not present

## 2023-09-05 DIAGNOSIS — Z515 Encounter for palliative care: Secondary | ICD-10-CM | POA: Diagnosis not present

## 2023-09-05 DIAGNOSIS — Z7189 Other specified counseling: Secondary | ICD-10-CM | POA: Diagnosis not present

## 2023-09-05 DIAGNOSIS — I63532 Cerebral infarction due to unspecified occlusion or stenosis of left posterior cerebral artery: Secondary | ICD-10-CM | POA: Diagnosis not present

## 2023-09-05 NOTE — Plan of Care (Signed)
  Problem: Education: Goal: Knowledge of disease or condition will improve Outcome: Progressing Goal: Knowledge of secondary prevention will improve (MUST DOCUMENT ALL) Outcome: Progressing Goal: Knowledge of patient specific risk factors will improve (DELETE if not current risk factor) Outcome: Progressing   Problem: Ischemic Stroke/TIA Tissue Perfusion: Goal: Complications of ischemic stroke/TIA will be minimized Outcome: Progressing   Problem: Coping: Goal: Will verbalize positive feelings about self Outcome: Progressing Goal: Will identify appropriate support needs Outcome: Progressing   Problem: Health Behavior/Discharge Planning: Goal: Ability to manage health-related needs will improve Outcome: Progressing Goal: Goals will be collaboratively established with patient/family Outcome: Progressing   Problem: Self-Care: Goal: Ability to participate in self-care as condition permits will improve Outcome: Progressing Goal: Verbalization of feelings and concerns over difficulty with self-care will improve Outcome: Progressing Goal: Ability to communicate needs accurately will improve Outcome: Progressing   Problem: Education: Goal: Knowledge of General Education information will improve Description: Including pain rating scale, medication(s)/side effects and non-pharmacologic comfort measures Outcome: Progressing   Problem: Health Behavior/Discharge Planning: Goal: Ability to manage health-related needs will improve Outcome: Progressing   Problem: Clinical Measurements: Goal: Ability to maintain clinical measurements within normal limits will improve Outcome: Progressing Goal: Will remain free from infection Outcome: Progressing Goal: Diagnostic test results will improve Outcome: Progressing Goal: Respiratory complications will improve Outcome: Progressing Goal: Cardiovascular complication will be avoided Outcome: Progressing
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
## 2023-09-05 NOTE — Progress Notes (Signed)
 [facility] [phone] [facility] Liaison Note   Unfortunately, [facility] is unable to offer a bed today. Spoke with patient wife via phone to see if she was interested in [facility] in [place]. Per wife she will continue to wait for bed to open at [facility]. TOC aware that liaison will follow up tomorrow or sooner if a bed becomes available.    Thank you for allowing us  to participate in this patient's care.   Eleanor Nail, LPN [facility] Liaison [phone]

## 2023-09-05 NOTE — Progress Notes (Signed)
 Progress Note    Henry Simmons  FMW:[id number] DOB: 07/26/1970  DOA: 08/25/2023 PCP: Petrina Pries, NP      Brief Narrative:    Medical records reviewed and are as summarized below:  Henry Simmons is a 53 y.o. 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 He was found to have acute PCA stroke.       Assessment/Plan:   Principal Problem:   CVA (cerebral vascular accident) (HCC) Active Problems:   Malnutrition of moderate degree   Nutrition Problem: Moderate Malnutrition Etiology: chronic illness (recurrent CVAs)  Signs/Symptoms: mild fat depletion, moderate muscle depletion   Body mass index is 17.38 kg/m.   Acute left PCA stroke History of left MCA stroke with right-sided hemiparesis and aphasia Chronic systolic heart failure with EF of 30 to 35% CAD s/p PCI Elevated troponins in the setting of demand ischemia Hypertension Hyperlipidemia Seizure disorder   PLAN  Continue comfort measures Continue IV Keppra       Diet Order     None                Consultants: Cardiologist Neurologist Palliative care and hospice  Procedures: None    Medications:    levETIRAcetam   250 mg Intravenous Q12H   mouth rinse  15 mL Mouth Rinse 4 times per day   pantoprazole  (PROTONIX ) IV  40 mg Intravenous Daily   Continuous Infusions:   Anti-infectives (From admission, onward)    None              Family Communication/Anticipated D/C date and plan/Code Status   DVT prophylaxis:      Code Status: Do not attempt resuscitation (DNR) - Comfort care  Family Communication: None Disposition Plan: Plan to discharge to hospice house   Status is:  Inpatient Remains inpatient appropriate because: Awaiting placement to hospice house       Subjective:   Interval events noted.  He is nonverbal   Objective:    Vitals:   09/04/23 1400 09/04/23 2005 09/04/23 2005 09/05/23 0751  BP: (!) 137/102 (!) 141/111 (!) 141/111 (!) 152/115  Pulse: (!) 107 (!) 103 (!) 103 (!) 115  Resp:  14 14 18   Temp: 100 F (37.8 C) 99.3 F (37.4 C) 99.3 F (37.4 C) 97.7 F (36.5 C)  TempSrc:  Axillary Oral Oral  SpO2: 98% 94% 94% 92%  Weight:      Height:       No data found.   Intake/Output Summary (Last 24 hours) at 09/05/2023 1054 Last data filed at 09/05/2023 0600 Gross per 24 hour  Intake 0 ml  Output 300 ml  Net -300 ml   Filed Weights   08/29/23 0531  Weight: 61.4 kg    Exam:  GEN: NAD SKIN: Warm and dry EYES: No pallor or icterus.  Right adducted high ENT: MMM CV: RRR PULM: CTA B ABD: soft, ND, NT, +BS CNS: He is nonverbal and does not follow commands.  He does not know move his extremities.  Right hand contracture noted. EXT: No edema or tenderness      Data Reviewed:   I have personally reviewed following labs and imaging studies:  Labs: Labs show the following:   Basic Metabolic Panel: No results for  input(s): NA, K, CL, CO2, GLUCOSE, BUN, CREATININE, CALCIUM , MG, PHOS in the last 168 hours. GFR Estimated Creatinine Clearance: 66.1 mL/min (by C-G formula based on SCr of 1.11 mg/dL). Liver Function Tests: No results for input(s): AST, ALT, ALKPHOS, BILITOT, PROT, ALBUMIN in the last 168 hours. No results for input(s): LIPASE, AMYLASE in the last 168 hours. No results for input(s): AMMONIA in the last 168 hours. Coagulation profile No results for input(s): INR, PROTIME in the last 168 hours.  CBC: No results for input(s): WBC, NEUTROABS, HGB, HCT, MCV, PLT in the last 168 hours. Cardiac Enzymes: No results for input(s): CKTOTAL, CKMB, CKMBINDEX,  TROPONINI in the last 168 hours. BNP (last 3 results) No results for input(s): PROBNP in the last 8760 hours. CBG: Recent Labs  Lab 08/29/23 1132  GLUCAP 110*   D-Dimer: No results for input(s): DDIMER in the last 72 hours. Hgb A1c: No results for input(s): HGBA1C in the last 72 hours. Lipid Profile: No results for input(s): CHOL, HDL, LDLCALC, TRIG, CHOLHDL, LDLDIRECT in the last 72 hours. Thyroid function studies: No results for input(s): TSH, T4TOTAL, T3FREE, THYROIDAB in the last 72 hours.  Invalid input(s): FREET3 Anemia work up: No results for input(s): VITAMINB12, FOLATE, FERRITIN, TIBC, IRON, RETICCTPCT in the last 72 hours. Sepsis Labs: No results for input(s): PROCALCITON, WBC, LATICACIDVEN in the last 168 hours.  Microbiology No results found for this or any previous visit (from the past 240 hours).  Procedures and diagnostic studies:  No results found.             LOS: 11 days   Joshu Furukawa  [employer]ered loss adjuster on [email or web address]. If 7PM-7AM, please contact night-coverage at [email or web address]     09/05/2023, 10:54 AM

## 2023-09-06 DIAGNOSIS — I63532 Cerebral infarction due to unspecified occlusion or stenosis of left posterior cerebral artery: Secondary | ICD-10-CM | POA: Diagnosis not present

## 2023-09-06 NOTE — Plan of Care (Signed)
  Problem: Education: Goal: Knowledge of disease or condition will improve Outcome: Not Progressing Goal: Knowledge of secondary prevention will improve (MUST DOCUMENT ALL) Outcome: Not Progressing Goal: Knowledge of patient specific risk factors will improve (DELETE if not current risk factor) Outcome: Not Progressing   Problem: Ischemic Stroke/TIA Tissue Perfusion: Goal: Complications of ischemic stroke/TIA will be minimized Outcome: Not Progressing   Problem: Coping: Goal: Will verbalize positive feelings about self Outcome: Not Progressing Goal: Will identify appropriate support needs Outcome: Not Progressing   Problem: Health Behavior/Discharge Planning: Goal: Ability to manage health-related needs will improve Outcome: Not Progressing Goal: Goals will be collaboratively established with patient/family Outcome: Not Progressing   Problem: Self-Care: Goal: Ability to participate in self-care as condition permits will improve Outcome: Not Progressing Goal: Verbalization of feelings and concerns over difficulty with self-care will improve Outcome: Not Progressing Goal: Ability to communicate needs accurately will improve Outcome: Not Progressing   Problem: Education: Goal: Knowledge of General Education information will improve Description: Including pain rating scale, medication(s)/side effects and non-pharmacologic comfort measures Outcome: Not Progressing   Problem: Health Behavior/Discharge Planning: Goal: Ability to manage health-related needs will improve Outcome: Not Progressing   Problem: Clinical Measurements: Goal: Ability to maintain clinical measurements within normal limits will improve Outcome: Not Progressing Goal: Will remain free from infection Outcome: Not Progressing Goal: Diagnostic test results will improve Outcome: Not Progressing Goal: Respiratory complications will improve Outcome: Not Progressing Goal: Cardiovascular complication will be  avoided Outcome: Not Progressing
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
## 2023-09-06 NOTE — Progress Notes (Addendum)
 Progress Note    Henry Simmons  FMW:[id number] DOB: 07/06/1970  DOA: 08/25/2023 PCP: Petrina Pries, NP      Brief Narrative:    Medical records reviewed and are as summarized below:  Henry Simmons is a 53 y.o. 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 He was found to have acute PCA stroke.       Assessment/Plan:   Principal Problem:   CVA (cerebral vascular accident) (HCC) Active Problems:   Malnutrition of moderate degree   Nutrition Problem: Moderate Malnutrition Etiology: chronic illness (recurrent CVAs)  Signs/Symptoms: mild fat depletion, moderate muscle depletion   Body mass index is 17.38 kg/m.   Acute left PCA stroke History of left MCA stroke with right-sided hemiparesis and aphasia Chronic systolic heart failure with EF of 30 to 35% CAD s/p PCI Elevated troponins in the setting of demand ischemia Hypertension Hyperlipidemia Seizure disorder   PLAN  Continue IV Keppra  Continue comfort care Awaiting placement to hospice house     Diet Order     None                Consultants: Cardiologist Neurologist Palliative care and hospice  Procedures: None    Medications:    levETIRAcetam   250 mg Intravenous Q12H   mouth rinse  15 mL Mouth Rinse 4 times per day   Continuous Infusions:   Anti-infectives (From admission, onward)    None              Family Communication/Anticipated D/C date and plan/Code Status   DVT prophylaxis:      Code Status: Do not attempt resuscitation (DNR) - Comfort care  Family Communication: None Disposition Plan: Plan to discharge to hospice house   Status is: Inpatient Remains inpatient  appropriate because: Awaiting placement to hospice house       Subjective:   No acute events noted.  He is aphasic and unable to provide any history.  Objective:    Vitals:   09/04/23 2005 09/05/23 0751 09/05/23 2000 09/06/23 0737  BP: (!) 141/111 (!) 152/115 (!) 157/111 (!) 148/109  Pulse: (!) 103 (!) 115 (!) 116 (!) 54  Resp: 14 18 (!) 21 14  Temp: 99.3 F (37.4 C) 97.7 F (36.5 C) 100.3 F (37.9 C) 97.6 F (36.4 C)  TempSrc: Oral Oral Axillary Oral  SpO2: 94% 92% 97% 98%  Weight:      Height:       No data found.   Intake/Output Summary (Last 24 hours) at 09/06/2023 1516 Last data filed at 09/06/2023 0700 Gross per 24 hour  Intake --  Output 400 ml  Net -400 ml   Filed Weights   08/29/23 0531  Weight: 61.4 kg    Exam:   GEN: NAD SKIN: Warm and dry EYES: No pallor or icterus.  Right adducted eye ENT: MMM CV: RRR PULM: CTA B ABD: soft, ND, NT, +BS CNS: Aphasic.  He will left upper and lower extremities spontaneously but I did not see him over his right upper and lower extremities.  He has right hand contracture. EXT: No edema or tenderness       Data Reviewed:   I have personally reviewed following labs and imaging studies:  Labs: Labs show  the following:   Basic Metabolic Panel: No results for input(s): NA, K, CL, CO2, GLUCOSE, BUN, CREATININE, CALCIUM , MG, PHOS in the last 168 hours. GFR Estimated Creatinine Clearance: 66.1 mL/min (by C-G formula based on SCr of 1.11 mg/dL). Liver Function Tests: No results for input(s): AST, ALT, ALKPHOS, BILITOT, PROT, ALBUMIN in the last 168 hours. No results for input(s): LIPASE, AMYLASE in the last 168 hours. No results for input(s): AMMONIA in the last 168 hours. Coagulation profile No results for input(s): INR, PROTIME in the last 168 hours.  CBC: No results for input(s): WBC, NEUTROABS, HGB, HCT, MCV, PLT in the last 168 hours. Cardiac  Enzymes: No results for input(s): CKTOTAL, CKMB, CKMBINDEX, TROPONINI in the last 168 hours. BNP (last 3 results) No results for input(s): PROBNP in the last 8760 hours. CBG: No results for input(s): GLUCAP in the last 168 hours.  D-Dimer: No results for input(s): DDIMER in the last 72 hours. Hgb A1c: No results for input(s): HGBA1C in the last 72 hours. Lipid Profile: No results for input(s): CHOL, HDL, LDLCALC, TRIG, CHOLHDL, LDLDIRECT in the last 72 hours. Thyroid function studies: No results for input(s): TSH, T4TOTAL, T3FREE, THYROIDAB in the last 72 hours.  Invalid input(s): FREET3 Anemia work up: No results for input(s): VITAMINB12, FOLATE, FERRITIN, TIBC, IRON, RETICCTPCT in the last 72 hours. Sepsis Labs: No results for input(s): PROCALCITON, WBC, LATICACIDVEN in the last 168 hours.  Microbiology No results found for this or any previous visit (from the past 240 hours).  Procedures and diagnostic studies:  No results found.             LOS: 12 days   Henry Simmons  [facility] [employer] loss adjuster on [email or web address]. If 7PM-7AM, please contact night-coverage at [email or web address]     09/06/2023, 3:16 PM

## 2023-09-06 NOTE — Progress Notes (Signed)
 Spouse at bedside encourage oral intake this morning.

## 2023-09-06 NOTE — Progress Notes (Signed)
 [facility] [phone] [facility] Liaison Note   Unfortunately, [facility] is unable to offer a bed today.  TOC aware that liaison will follow up tomorrow or sooner if a bed becomes available.    Thank you for allowing us  to participate in this patient's care.   Eleanor Nail, LPN [facility] Liaison [phone]

## 2023-09-07 DIAGNOSIS — I63339 Cerebral infarction due to thrombosis of unspecified posterior cerebral artery: Secondary | ICD-10-CM | POA: Diagnosis not present

## 2023-09-07 NOTE — Progress Notes (Signed)
 [facility] [phone] [facility] Liaison Note   Unfortunately, [facility] is unable to offer a bed today.  TOC aware that liaison will follow up tomorrow or sooner if a bed becomes available.    Thank you for allowing us  to participate in this patient's care.  Elouise Husband, BSN, RN, OCN [employer] [phone]

## 2023-09-07 NOTE — Progress Notes (Signed)
 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 He was found to have acute PCA stroke.  09/07/2023: Patient seen alongside patient's son and nurse.  Patient remains comfortable.  Pursue disposition.  Assessment/Plan:   Principal Problem:   CVA (cerebral vascular accident) (HCC) Active Problems:   Malnutrition of moderate degree   Nutrition Problem: Moderate Malnutrition Etiology: chronic illness (recurrent CVAs)  Signs/Symptoms: mild fat depletion, moderate muscle depletion   Body mass index is 17.38 kg/m.   Acute left PCA stroke History of left MCA stroke with right-sided hemiparesis and aphasia Chronic systolic heart failure with EF of 30 to 35% CAD s/p PCI Elevated troponins in the setting of demand ischemia Hypertension Hyperlipidemia Seizure disorder   PLAN  Continue IV Keppra  Continue comfort care Awaiting placement to hospice house     Diet Order     None                Consultants: Cardiologist Neurologist Palliative care and hospice  Procedures: None    Medications:    levETIRAcetam   250 mg Intravenous Q12H   mouth rinse  15 mL Mouth Rinse 4 times per day   Continuous Infusions:   Anti-infectives (From admission, onward)    None              Family Communication/Anticipated D/C date and plan/Code Status   DVT prophylaxis:      Code Status: Do not attempt resuscitation (DNR) - Comfort care  Family Communication: None Disposition Plan: Plan to discharge to hospice  house   Status is: Inpatient Remains inpatient appropriate because: Awaiting placement to hospice house       Subjective:   No acute events noted.  He is aphasic and unable to provide any history.  Objective:    Vitals:   09/05/23 2000 09/06/23 0737 09/06/23 2002 09/07/23 0730  BP: (!) 157/111 (!) 148/109 (!) 119/100 (!) 125/107  Pulse: (!) 116 (!) 54 (!) 123 (!) 122  Resp: (!) 21 14 19 17   Temp: 100.3 F (37.9 C) 97.6 F (36.4 C) 98.1 F (36.7 C) 98.7 F (37.1 C)  TempSrc: Axillary Oral Oral Oral  SpO2: 97% 98% 100% 100%  Weight:      Height:       No data found.   Intake/Output Summary (Last 24 hours) at 09/07/2023 1756 Last data filed at 09/07/2023 0600 Gross per 24 hour  Intake --  Output 200 ml  Net -200 ml   Filed Weights   08/29/23 0531  Weight: 61.4 kg    Exam:   GEN: NAD SKIN: Warm and dry EYES: No pallor or icterus.  Right adducted eye ENT: MMM CV: RRR PULM: CTA B ABD: soft, ND, NT, +BS CNS: Aphasic.  He will left upper and lower extremities spontaneously but I did not see him over his right upper and lower extremities.  He has right hand contracture. EXT: No edema or tenderness       Data Reviewed:   I have personally reviewed following labs and imaging studies:  Labs: Labs show the following:  Basic Metabolic Panel: No results for input(s): NA, K, CL, CO2, GLUCOSE, BUN, CREATININE, CALCIUM , MG, PHOS in the last 168 hours. GFR Estimated Creatinine Clearance: 66.1 mL/min (by C-G formula based on SCr of 1.11 mg/dL). Liver Function Tests: No results for input(s): AST, ALT, ALKPHOS, BILITOT, PROT, ALBUMIN in the last 168 hours. No results for input(s): LIPASE, AMYLASE in the last 168 hours. No results for input(s): AMMONIA in the last 168 hours. Coagulation profile No results for input(s): INR, PROTIME in the last 168 hours.  CBC: No results for input(s): WBC, NEUTROABS, HGB,  HCT, MCV, PLT in the last 168 hours. Cardiac Enzymes: No results for input(s): CKTOTAL, CKMB, CKMBINDEX, TROPONINI in the last 168 hours. BNP (last 3 results) No results for input(s): PROBNP in the last 8760 hours. CBG: No results for input(s): GLUCAP in the last 168 hours.  D-Dimer: No results for input(s): DDIMER in the last 72 hours. Hgb A1c: No results for input(s): HGBA1C in the last 72 hours. Lipid Profile: No results for input(s): CHOL, HDL, LDLCALC, TRIG, CHOLHDL, LDLDIRECT in the last 72 hours. Thyroid function studies: No results for input(s): TSH, T4TOTAL, T3FREE, THYROIDAB in the last 72 hours.  Invalid input(s): FREET3 Anemia work up: No results for input(s): VITAMINB12, FOLATE, FERRITIN, TIBC, IRON, RETICCTPCT in the last 72 hours. Sepsis Labs: No results for input(s): PROCALCITON, WBC, LATICACIDVEN in the last 168 hours.  Microbiology No results found for this or any previous visit (from the past 240 hours).  Procedures and diagnostic studies:  No results found.             LOS: 13 days   Leatrice LILLETTE Chapel  [facility]   Pager on [email or web address]. If 7PM-7AM, please contact night-coverage at [email or web address]     09/07/2023, 5:56 PM

## 2023-09-07 NOTE — TOC Progression Note (Signed)
 Transition of Care [facility]) - Progression Note    Patient Details  Name: Henry Simmons MRN: [id number] Date of Birth: 12-16-1970  Transition of Care [facility]) CM/SW Contact  [name], [name] Phone Number: 09/07/2023, 11:24 AM  Clinical Narrative:   CSW following for bed at [facility], still not available today per [facility]. Family not interested in anywhere else due to location. CSW to follow.    Expected Discharge Plan: Hospice Medical Facility Barriers to Discharge: Continued Medical Work up               Expected Discharge Plan and Services In-house Referral: Clinical Social Work Discharge Planning Services: CM Consult Post Acute Care Choice: Skilled Nursing Facility Living arrangements for the past 2 months: Single Family Home                                       Social Drivers of Health (SDOH) Interventions SDOH Screenings   Food Insecurity: No Food Insecurity (08/26/2023)  Housing: Unknown (08/26/2023)  Transportation Needs: No Transportation Needs (08/26/2023)  Utilities: Not At Risk (08/26/2023)  Depression (PHQ2-9): Low Risk  (07/05/2023)  Tobacco Use: High Risk (08/26/2023)    Readmission Risk Interventions     No data to display

## 2023-09-08 ENCOUNTER — Other Ambulatory Visit (HOSPITAL_COMMUNITY): Payer: Self-pay

## 2023-09-08 DIAGNOSIS — I63339 Cerebral infarction due to thrombosis of unspecified posterior cerebral artery: Secondary | ICD-10-CM | POA: Diagnosis not present

## 2023-09-08 DIAGNOSIS — R4589 Other symptoms and signs involving emotional state: Secondary | ICD-10-CM

## 2023-09-08 MED ORDER — POLYVINYL ALCOHOL 1.4 % OP SOLN
1.0000 [drp] | Freq: Four times a day (QID) | OPHTHALMIC | 0 refills | Status: AC | PRN
  Filled 2023-09-08: qty 15, 75d supply, fill #0

## 2023-09-08 MED ORDER — HALOPERIDOL 0.5 MG PO TABS: 0.5000 mg | ORAL_TABLET | ORAL | 0 refills | Status: AC | PRN

## 2023-09-08 MED ORDER — HALOPERIDOL LACTATE 2 MG/ML PO CONC: 0.5000 mg | ORAL | 0 refills | Status: AC | PRN

## 2023-09-08 MED ORDER — LEVETIRACETAM 100 MG/ML PO SOLN: 250.0000 mg | Freq: Two times a day (BID) | ORAL | 0 refills | Status: AC

## 2023-09-08 MED ORDER — ORAL CARE MOUTH RINSE: 15.0000 mL | Freq: Four times a day (QID) | OROMUCOSAL | Status: AC

## 2023-09-08 MED ORDER — ORAL CARE MOUTH RINSE: 15.0000 mL | OROMUCOSAL | Status: AC | PRN

## 2023-09-08 MED ORDER — GLYCOPYRROLATE 1 MG PO TABS: 1.0000 mg | ORAL_TABLET | ORAL | 0 refills | Status: AC | PRN

## 2023-09-08 MED ORDER — DIAZEPAM 1 MG/ML PO SOLN: 2.5000 mg | Freq: Three times a day (TID) | ORAL | 0 refills | Status: AC | PRN

## 2023-09-08 MED ORDER — ACETAMINOPHEN 325 MG PO TABS: 650.0000 mg | ORAL_TABLET | Freq: Four times a day (QID) | ORAL | Status: AC | PRN

## 2023-09-08 NOTE — Plan of Care (Signed)
  Problem: Education: Goal: Knowledge of disease or condition will improve Outcome: Progressing Goal: Knowledge of secondary prevention will improve (MUST DOCUMENT ALL) Outcome: Progressing Goal: Knowledge of patient specific risk factors will improve (DELETE if not current risk factor) Outcome: Progressing   Problem: Ischemic Stroke/TIA Tissue Perfusion: Goal: Complications of ischemic stroke/TIA will be minimized Outcome: Progressing   Problem: Coping: Goal: Will verbalize positive feelings about self Outcome: Progressing Goal: Will identify appropriate support needs Outcome: Progressing   Problem: Health Behavior/Discharge Planning: Goal: Ability to manage health-related needs will improve Outcome: Progressing Goal: Goals will be collaboratively established with patient/family Outcome: Progressing   Problem: Self-Care: Goal: Ability to participate in self-care as condition permits will improve Outcome: Progressing Goal: Verbalization of feelings and concerns over difficulty with self-care will improve Outcome: Progressing Goal: Ability to communicate needs accurately will improve Outcome: Progressing   Problem: Education: Goal: Knowledge of General Education information will improve Description: Including pain rating scale, medication(s)/side effects and non-pharmacologic comfort measures Outcome: Progressing   Problem: Health Behavior/Discharge Planning: Goal: Ability to manage health-related needs will improve Outcome: Progressing   Problem: Clinical Measurements: Goal: Ability to maintain clinical measurements within normal limits will improve Outcome: Progressing Goal: Will remain free from infection Outcome: Progressing Goal: Diagnostic test results will improve Outcome: Progressing Goal: Respiratory complications will improve Outcome: Progressing Goal: Cardiovascular complication will be avoided Outcome: Progressing

## 2023-09-08 NOTE — TOC Transition Note (Signed)
 Transition of Care [facility]) - Discharge Note   Patient Details  Name: Henry Simmons MRN: [id number] Date of Birth: Jun 28, 1970  Transition of Care [facility]) CM/SW Contact:  Gwenn Julien Norris, LCSW Phone Number: 09/08/2023, 12:00 PM   Clinical Narrative: Pt for dc to [facility] today. Spoke to [place] with [facility] who confirms pt's wife has completed consents and they are prepared to admit. RN has called report and PTAR arranged for transport. SW signing off at dc.   Julien Gwenn, MSW, LCSW [phone] (coverage)        Final next level of care: Hospice Medical Facility Barriers to Discharge: Barriers Resolved   Patient Goals and CMS Choice   CMS Medicare.gov Compare Post Acute Care list provided to:: Patient Represenative (must comment) Choice offered to / list presented to : Spouse      Discharge Placement                Patient to be transferred to facility by: PTAR Name of family member notified: Henry Simmons/wife Patient and family notified of of transfer: 09/08/23  Discharge Plan and Services Additional resources added to the After Visit Summary for   In-house Referral: Clinical Social Work Discharge Planning Services: CM Consult Post Acute Care Choice: Skilled Nursing Facility                               Social Drivers of Health (SDOH) Interventions SDOH Screenings   Food Insecurity: No Food Insecurity (08/26/2023)  Housing: Unknown (08/26/2023)  Transportation Needs: No Transportation Needs (08/26/2023)  Utilities: Not At Risk (08/26/2023)  Depression (PHQ2-9): Low Risk  (07/05/2023)  Tobacco Use: High Risk (08/26/2023)     Readmission Risk Interventions     No data to display

## 2023-09-08 NOTE — Discharge Summary (Addendum)
 Physician Discharge Summary  Patient ID: Henry Simmons MRN: [id number] DOB/AGE: Jan 26, 1971 53 y.o.  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  Patient was admitted due to unresponsiveness at home.  Workup revealed acute PCA stroke.  It was eventually decided to pursue comfort directed care.  Patient will be discharged to hospice home.    Nutrition Problem: Moderate Malnutrition Etiology: chronic illness (recurrent CVAs)   Signs/Symptoms: mild fat depletion, moderate muscle depletion     Body mass index is 17.38 kg/m.     Acute left PCA stroke History of left MCA stroke with right-sided hemiparesis and aphasia Chronic systolic heart failure with EF of 30 to 35% CAD s/p PCI Elevated troponin due to demand ischemia Hypertension Hyperlipidemia Seizure disorder  Consults: cardiology and palliative care team  Significant Diagnostic Studies:  MRI brain revealed: 1. Extensive acute infarct throughout the left PCA territory. No acute hemorrhage or significant mass effect. 2. Additional focus of infarction within the anterior right thalamus, possibly representing right PCA territory involvement versus artery of Percheron involvement. 3. Encephalomalacia along the left frontoparietal junction from prior hemorrhagic infarct in the left MCA territory. Associated severe chronic small vessel disease with numerous foci of cortical and subcortical microhemorrhage, highly suggestive of cerebral  amyloid angiopathy.   Echocardiogram revealed: 1. Unusual thickening fo LV posterior basal wall with definity  ? aberrant  papillary muscle and chords.   2. Hyperechoic myocardium clinical correlation suggested to r/o amyloid .  Left ventricular ejection fraction, by estimation, is 30 to 35%. The left  ventricle has moderately decreased function. The left ventricle  demonstrates global hypokinesis. The left   ventricular internal cavity size was moderately dilated. There is  moderate left ventricular hypertrophy. Left ventricular diastolic  parameters are indeterminate.   3. Right ventricular systolic function is normal. The right ventricular  size is normal.   4. The mitral valve is abnormal. Trivial mitral valve regurgitation. No  evidence of mitral stenosis.   5. The aortic valve is tricuspid. Aortic valve regurgitation is not  visualized. No aortic stenosis is present.   6. The inferior vena cava is normal in size with greater than 50%  respiratory variability, suggesting right atrial pressure of 3 mmHg.    Treatments: Comfort care.  Discharge Exam: Blood pressure (!) 143/107, pulse 61, temperature (!) 97.5 F (36.4 C), temperature source Axillary, resp. rate 16, height 6' 2 (1.88 m), weight 61.4 kg, SpO2 100%.   Disposition: Discharge disposition: 51-Hospice/Medical Facility       Discharge Instructions     Diet - low sodium heart healthy   Complete by: As directed    Increase activity slowly   Complete by: As directed       Allergies as of 09/08/2023   No Known Allergies      Medication List     STOP taking these medications    amLODipine  5 MG tablet Commonly known as: NORVASC    Aspirin  Low Dose 81 MG tablet Generic drug: aspirin  EC   atorvastatin  80 MG tablet Commonly known as: LIPITOR    IRON PO  isosorbide  dinitrate 10 MG tablet Commonly known as: ISORDIL    levETIRAcetam  500 MG tablet Commonly known as: KEPPRA  Replaced by: levETIRAcetam   100 MG/ML solution   losartan  25 MG tablet Commonly known as: COZAAR    metoprolol  tartrate 25 MG tablet Commonly known as: LOPRESSOR    nitroGLYCERIN  0.4 MG SL tablet Commonly known as: NITROSTAT    pantoprazole  40 MG tablet Commonly known as: PROTONIX    VITAMIN C  PO       TAKE these medications    acetaminophen  325 MG tablet Commonly known as: TYLENOL  Take 2 tablets (650 mg total) by mouth every 6 (six) hours as needed for mild pain (pain score 1-3) (or Fever >/= 101). What changed:  when to take this reasons to take this   artificial tears ophthalmic solution Place 1 drop into both eyes 4 (four) times daily as needed for dry eyes.   diazepam  1 MG/ML solution Commonly known as: VALIUM  Take 2.5 mLs (2.5 mg total) by mouth every 8 (eight) hours as needed for anxiety.   glycopyrrolate  1 MG tablet Commonly known as: ROBINUL  Take 1 tablet (1 mg total) by mouth every 4 (four) hours as needed for up to 7 days (excessive secretions).   haloperidol  0.5 MG tablet Commonly known as: HALDOL  Take 1 tablet (0.5 mg total) by mouth every 4 (four) hours as needed for up to 7 days for agitation (or delirium).   haloperidol  2 MG/ML solution Commonly known as: HALDOL  Place 0.3 mLs (0.6 mg total) under the tongue every 4 (four) hours as needed for agitation (or delirium).   levETIRAcetam  100 MG/ML solution Commonly known as: Keppra  Take 2.5 mLs (250 mg total) by mouth 2 (two) times daily. Replaces: levETIRAcetam  500 MG tablet   mouth rinse Liqd solution 15 mLs by Mouth Rinse route in the morning, at noon, in the evening, and at bedtime.   mouth rinse Liqd solution 15 mLs by Mouth Rinse route as needed (oral care).       Time spent: 35 minutes.  SignedBETHA Leatrice LILLETTE Rosario 09/08/2023, 12:08 PM

## 2023-09-08 NOTE — Progress Notes (Signed)
 Henry Simmons [phone]  [facility] Liaison Note  Referral received from [facility] for family interest in [facility].   [facility] is able to accept patient today once consents are complete.   RN staff, you may call report at any time to [facility] @ [phone], room is assigned when report is called.   Please leave IV intact and send completed DNR with patient.   Updated attending and [facility] manager via [employer].   Thank you for the opportunity to participate in this patient's care   Nat Babe, BSN, RN Hospice Nurse Liaison [phone]

## 2023-09-08 NOTE — Progress Notes (Signed)
 AVS in packet for transport with scripts and medical necessity paperwork.
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
## 2023-09-08 NOTE — Progress Notes (Signed)
 Report given to Rock Fair, RN at [facility]. PIV remains in place per request. AVS, DNR form, Signed printed prescriptions, and social workers paperwork placed in discharge packet. [facility] to transport patient with all belongings.   Metta Moats, BSN RN

## 2023-09-14 DEATH — deceased

## 2023-11-15 ENCOUNTER — Encounter: Payer: Self-pay | Admitting: Physical Medicine & Rehabilitation

## 2024-01-17 ENCOUNTER — Encounter: Payer: Self-pay | Admitting: Family Medicine
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
## 2024-04-12 IMAGING — CT CT HEAD W/O CM
4 of 5 series · 16 of 47 positions shown, 18 images · non-contrast
Comparison: CT head 06/07/2021.  Limited MRI head 06/08/2021

CLINICAL DATA: Stroke follow-up.  Post left MCA thrombolysis.



[Series 3: head without · axial · non-contrast · 0.50mm/px · z∈[-374,-269]mm · 5 of 33 slices shown, 7 images]
[im 6/33  brain]
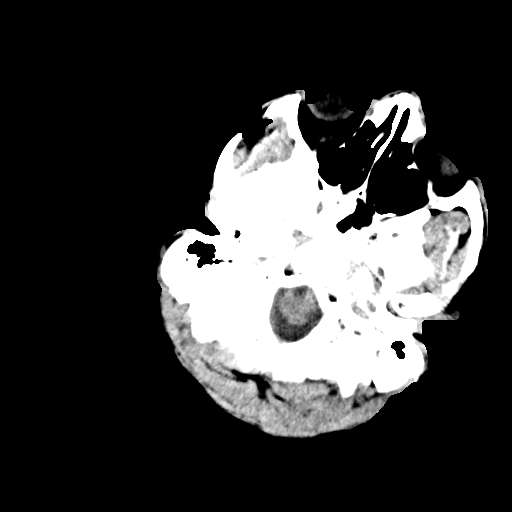
[im 6/33  bone]
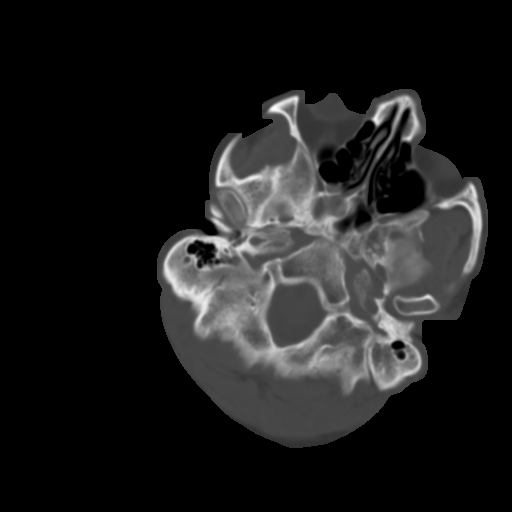
[im 11/33  brain]
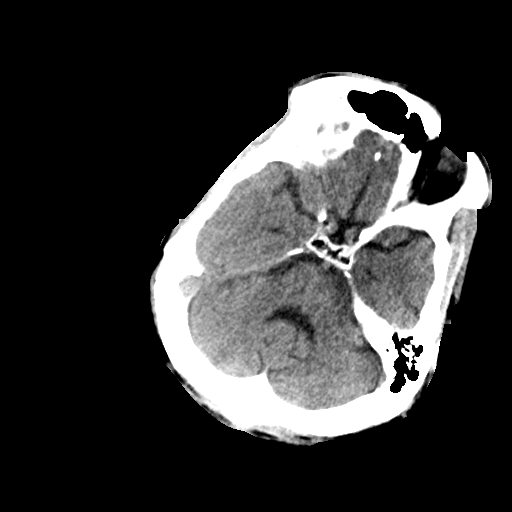
[im 17/33  brain]
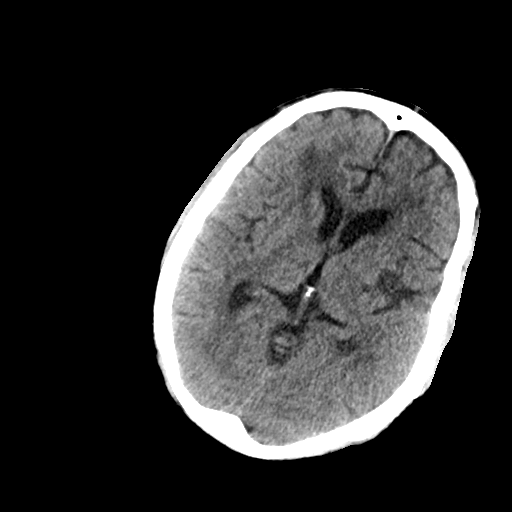
[im 22/33  brain]
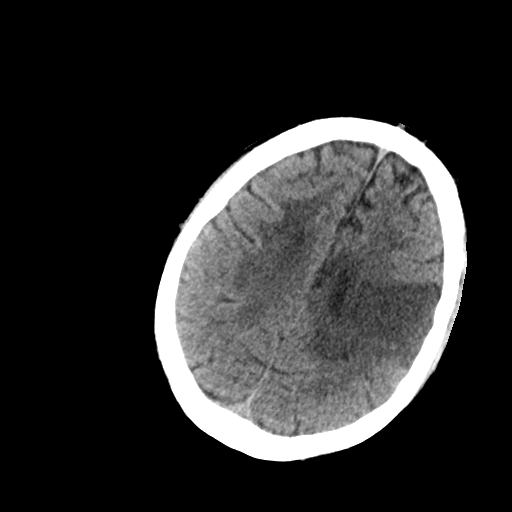
[im 27/33  brain]
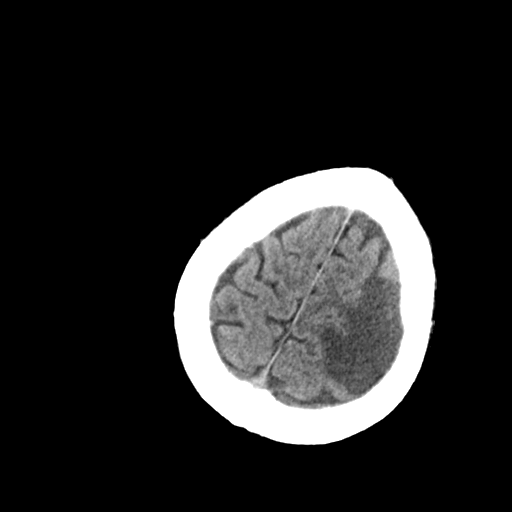
[im 27/33  bone]
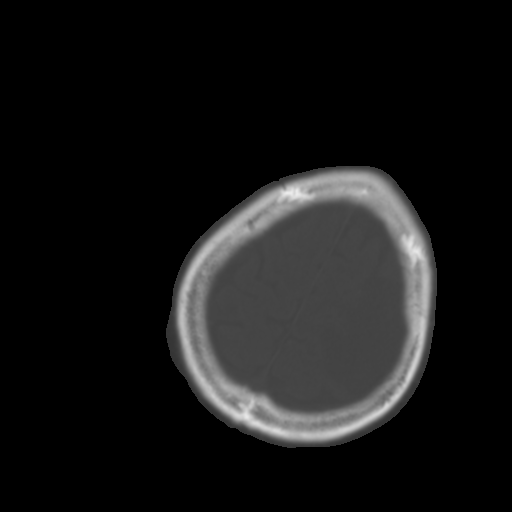

[Series 5: head without cor · coronal · non-contrast · 0.37mm/px · 3 of 77 slices shown]
[im 26/77  brain]
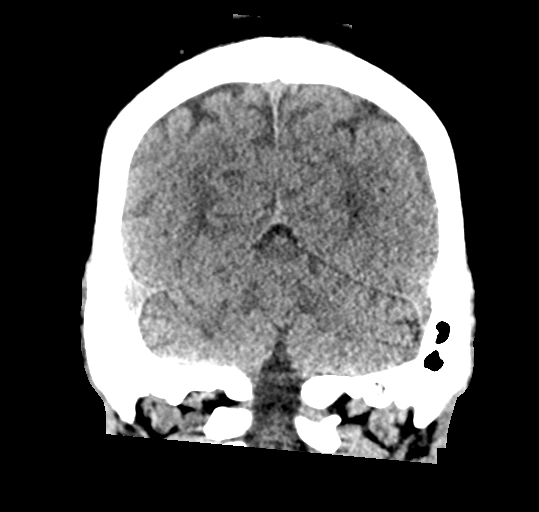
[im 34/77  brain]
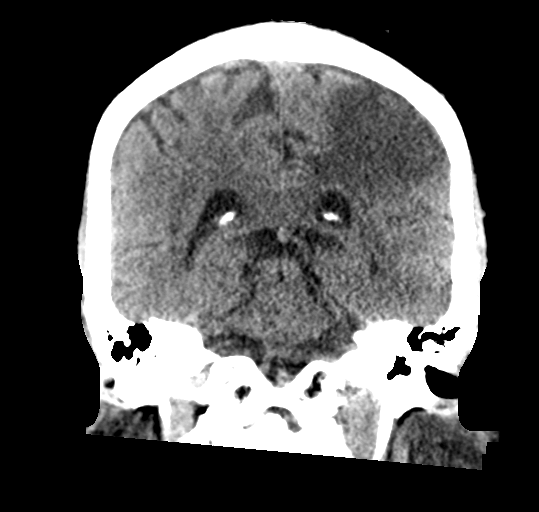
[im 43/77  brain]
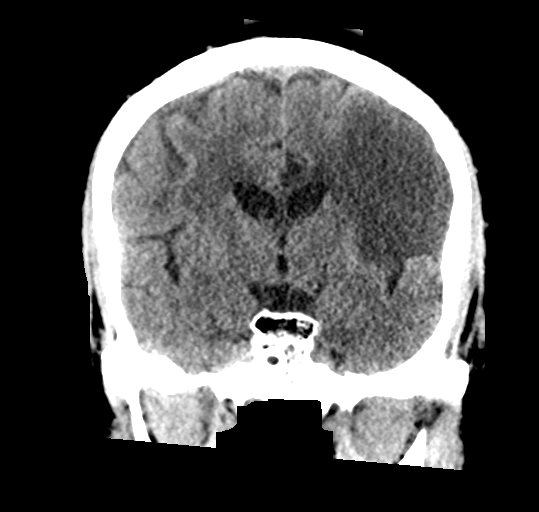

[Series 6: head without sag · sagittal · non-contrast · 0.35mm/px · 3 of 66 slices shown]
[im 22/66  brain]
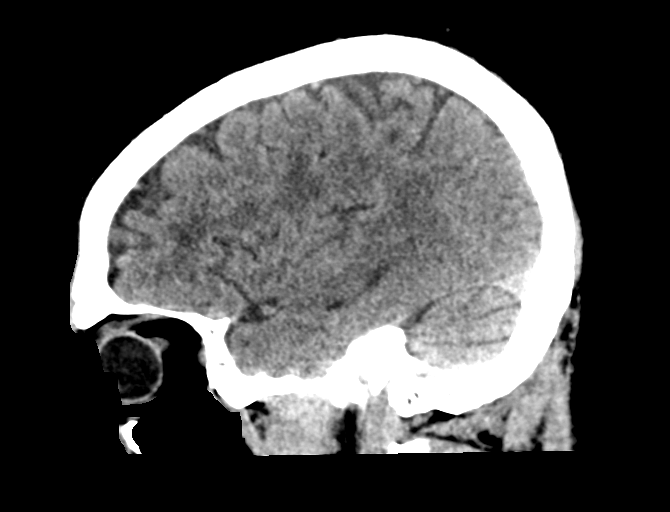
[im 33/66  brain]
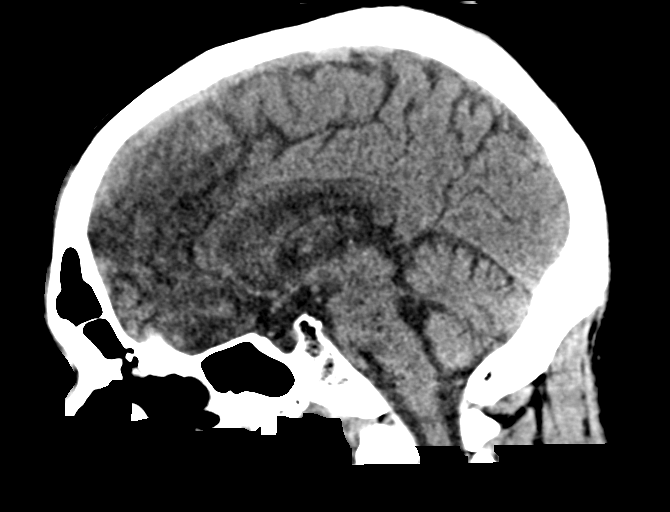
[im 44/66  brain]
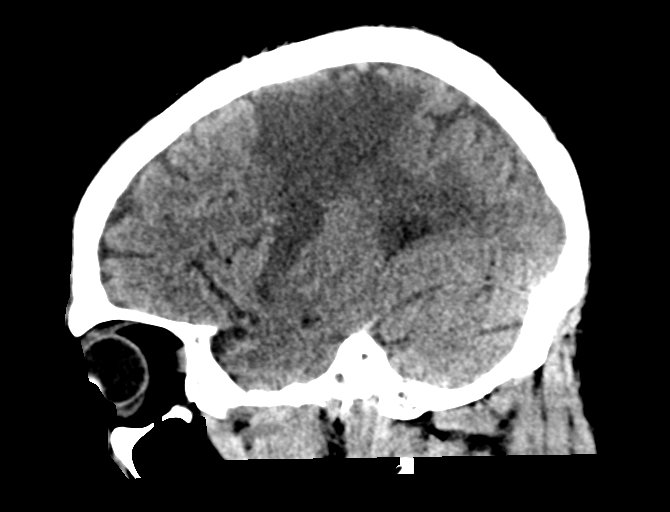

[Series 7: head without ax · axial · non-contrast · 0.50mm/px · z∈[-393,-279]mm · 5 of 35 slices shown]
[im 6/35  brain]
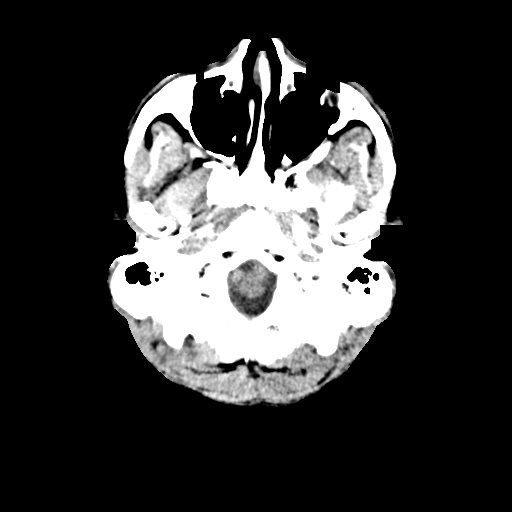
[im 12/35  brain]
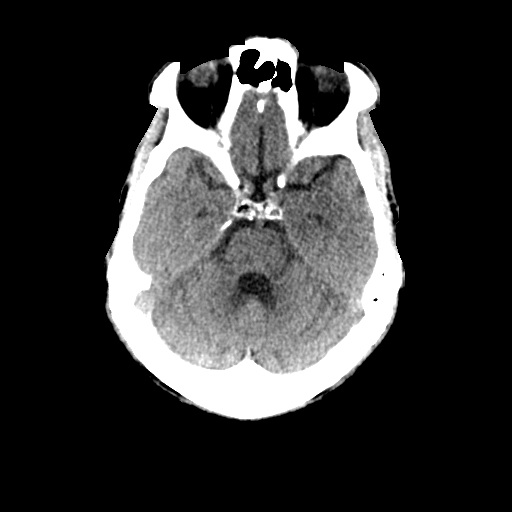
[im 18/35  brain]
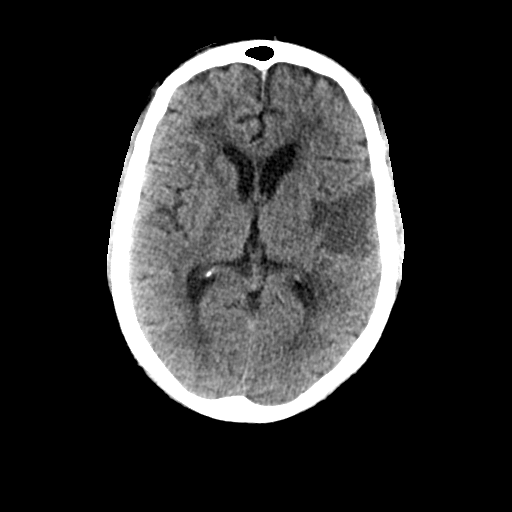
[im 23/35  brain]
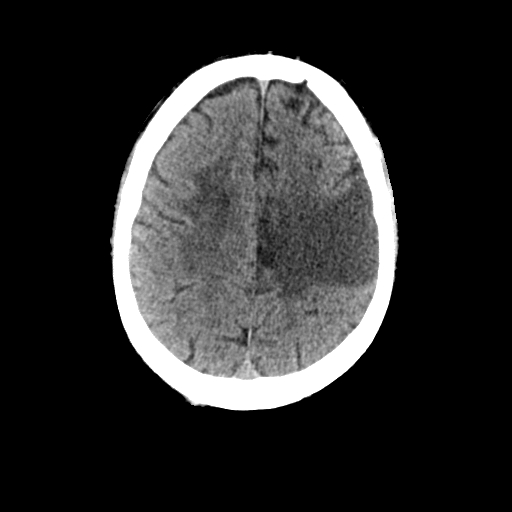
[im 29/35  brain]
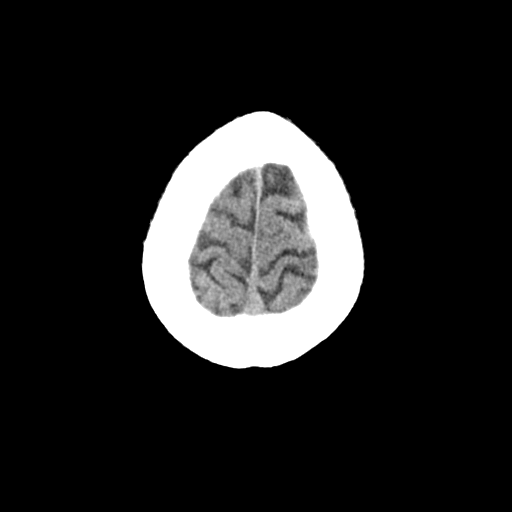

[16 of 47 positions shown; findings below may reference images not displayed]

FINDINGS: Brain: Interval development of a wedge-shaped hypodensity in the
left posterior frontal lobe. This extends down to the insula. This
is compatible with a moderately large acute infarct. No associated
hemorrhagic transformation.

No acute hemorrhage. Extensive white matter hypodensity diffusely
compatible with chronic ischemia. Chronic cortical infarct in the
left medial frontal lobe unchanged.

Ventricle size normal.  No midline shift

Vascular: Negative for hyperdense vessel

Skull: Negative

Sinuses/Orbits: Mild mucosal edema right sphenoid sinus. Remaining
sinuses clear. Negative orbit

Other: None
IMPRESSION: Moderately large area of infarct in the left posterior frontal lobe
extending down to the frontal operculum and insula. Infarct
territory similar to that seen on the MRI yesterday. No hemorrhagic
transformation identified.

Extensive chronic microvascular ischemic change in the white matter.

## 2024-04-14 IMAGING — DX DG ABDOMEN 1V
1 series · 1 of 1 positions shown · non-contrast
Comparison: 06/08/2021

CLINICAL DATA: Feeding difficulty

EXAM:
ABDOMEN - 1 VIEW

[abdomen]
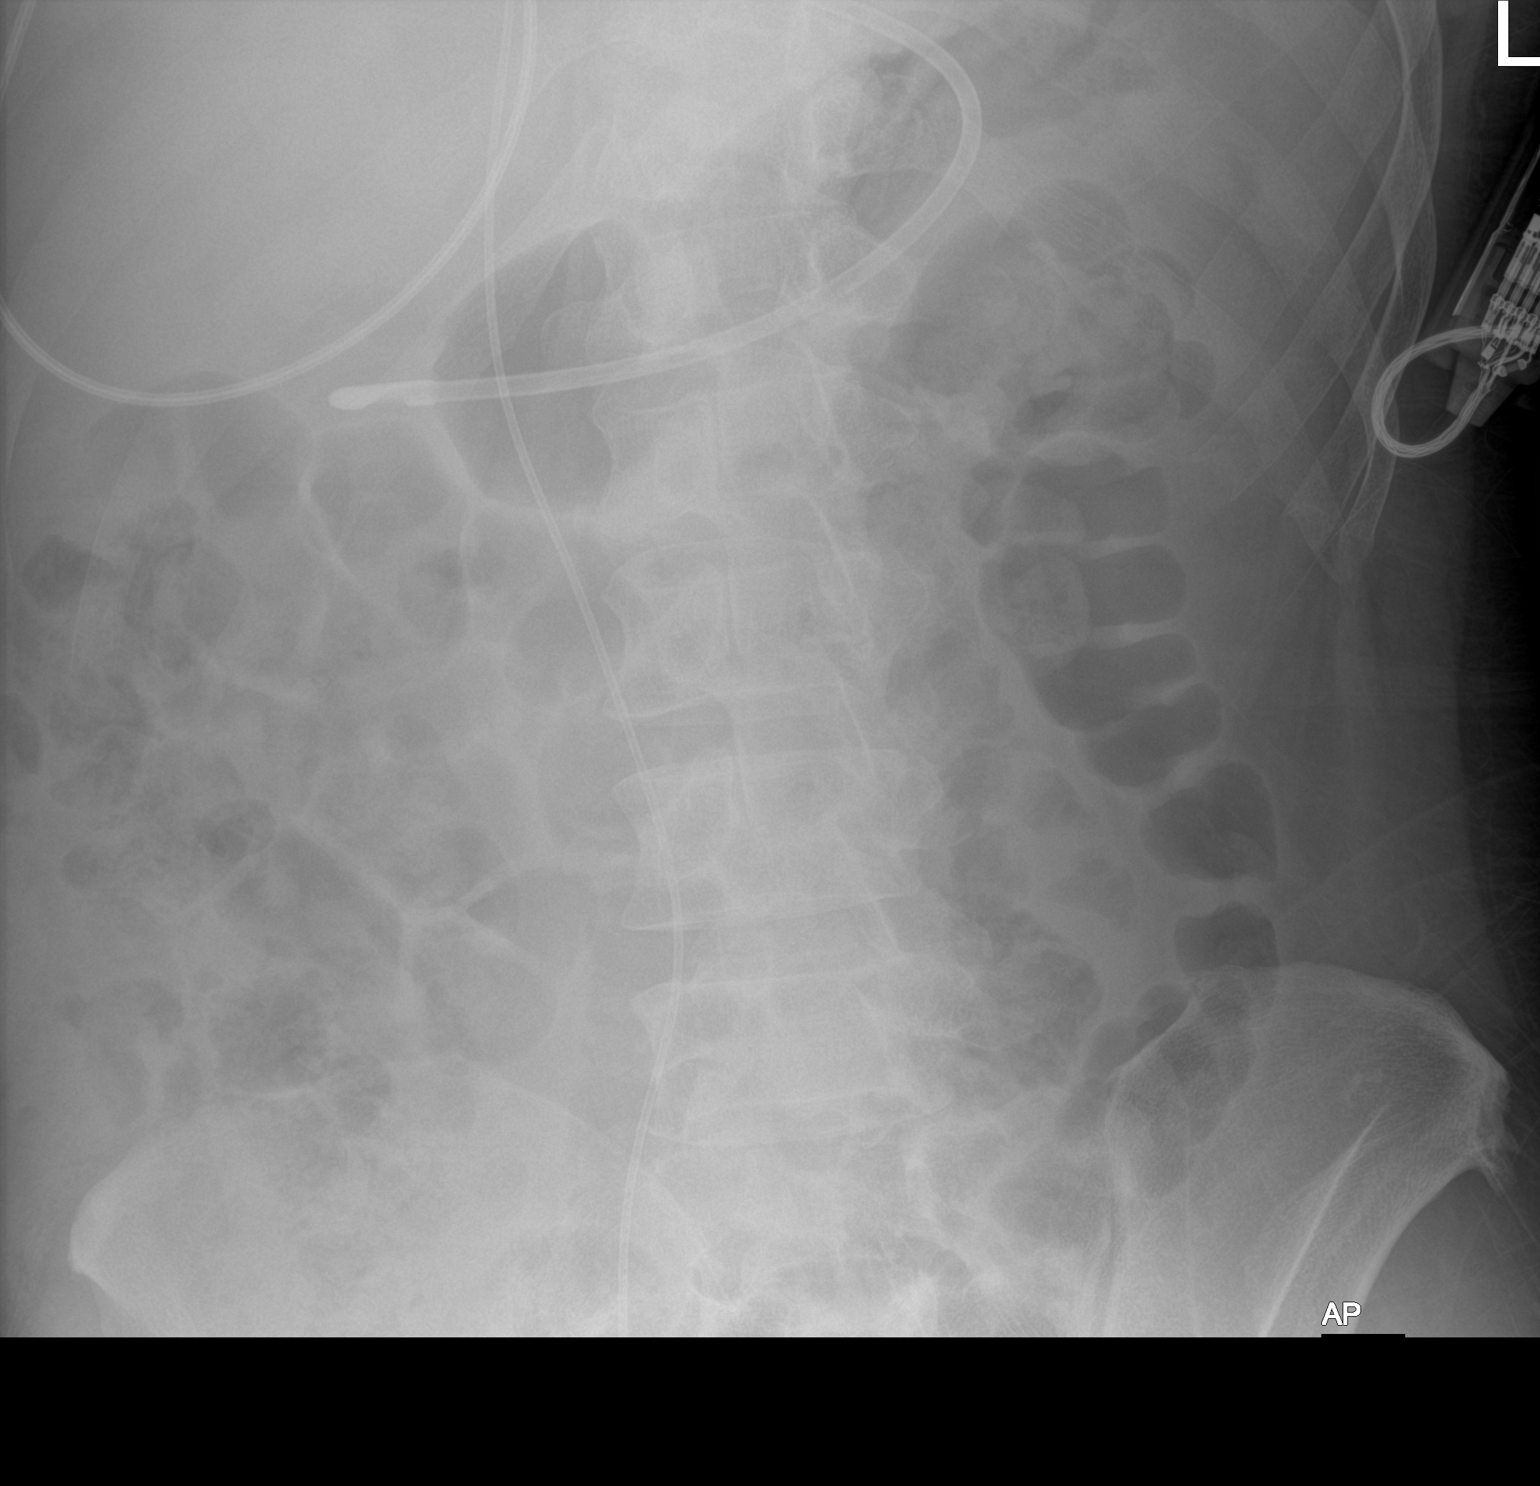

[1 of 1 positions shown; findings below may reference images not displayed]

FINDINGS: Enteric tube remains positioned with distal tip terminating at the
level of the distal stomach or proximal duodenum. Partially
visualized right femoral line. Nonobstructive bowel gas pattern.
IMPRESSION: Enteric tube tip at the level of the distal stomach or proximal
duodenum. Nonobstructive bowel gas pattern.
